# Patient Record
Sex: Female | Born: 1940 | Race: White | Hispanic: No | Smoking: Never smoker
Health system: Southern US, Community
[De-identification: ages and names within clinical notes are randomized; demographics above are authoritative.]

## PROBLEM LIST (undated history)

## (undated) DIAGNOSIS — M1611 Unilateral primary osteoarthritis, right hip: Secondary | ICD-10-CM

## (undated) DIAGNOSIS — Z950 Presence of cardiac pacemaker: Secondary | ICD-10-CM

## (undated) DIAGNOSIS — G8929 Other chronic pain: Secondary | ICD-10-CM

## (undated) DIAGNOSIS — I499 Cardiac arrhythmia, unspecified: Secondary | ICD-10-CM

## (undated) DIAGNOSIS — G629 Polyneuropathy, unspecified: Secondary | ICD-10-CM

## (undated) DIAGNOSIS — I252 Old myocardial infarction: Secondary | ICD-10-CM

## (undated) DIAGNOSIS — M545 Low back pain, unspecified: Secondary | ICD-10-CM

## (undated) DIAGNOSIS — I714 Abdominal aortic aneurysm, without rupture: Secondary | ICD-10-CM

## (undated) DIAGNOSIS — H539 Unspecified visual disturbance: Secondary | ICD-10-CM

## (undated) DIAGNOSIS — K922 Gastrointestinal hemorrhage, unspecified: Secondary | ICD-10-CM

## (undated) DIAGNOSIS — J302 Other seasonal allergic rhinitis: Secondary | ICD-10-CM

## (undated) DIAGNOSIS — D649 Anemia, unspecified: Secondary | ICD-10-CM

## (undated) DIAGNOSIS — R198 Other specified symptoms and signs involving the digestive system and abdomen: Secondary | ICD-10-CM

## (undated) DIAGNOSIS — I5022 Chronic systolic (congestive) heart failure: Secondary | ICD-10-CM

## (undated) DIAGNOSIS — I495 Sick sinus syndrome: Secondary | ICD-10-CM

## (undated) DIAGNOSIS — R0600 Dyspnea, unspecified: Secondary | ICD-10-CM

## (undated) DIAGNOSIS — R011 Cardiac murmur, unspecified: Secondary | ICD-10-CM

## (undated) DIAGNOSIS — R109 Unspecified abdominal pain: Secondary | ICD-10-CM

## (undated) DIAGNOSIS — M810 Age-related osteoporosis without current pathological fracture: Secondary | ICD-10-CM

## (undated) DIAGNOSIS — Z8719 Personal history of other diseases of the digestive system: Secondary | ICD-10-CM

## (undated) DIAGNOSIS — G4750 Parasomnia, unspecified: Secondary | ICD-10-CM

## (undated) DIAGNOSIS — K219 Gastro-esophageal reflux disease without esophagitis: Secondary | ICD-10-CM

## (undated) DIAGNOSIS — K269 Duodenal ulcer, unspecified as acute or chronic, without hemorrhage or perforation: Secondary | ICD-10-CM

## (undated) DIAGNOSIS — Z95818 Presence of other cardiac implants and grafts: Secondary | ICD-10-CM

## (undated) DIAGNOSIS — M1990 Unspecified osteoarthritis, unspecified site: Secondary | ICD-10-CM

## (undated) DIAGNOSIS — I4891 Unspecified atrial fibrillation: Secondary | ICD-10-CM

## (undated) DIAGNOSIS — M1711 Unilateral primary osteoarthritis, right knee: Secondary | ICD-10-CM

## (undated) DIAGNOSIS — K9189 Other postprocedural complications and disorders of digestive system: Secondary | ICD-10-CM

## (undated) DIAGNOSIS — I219 Acute myocardial infarction, unspecified: Secondary | ICD-10-CM

## (undated) DIAGNOSIS — J45909 Unspecified asthma, uncomplicated: Secondary | ICD-10-CM

## (undated) DIAGNOSIS — M109 Gout, unspecified: Secondary | ICD-10-CM

## (undated) DIAGNOSIS — K5790 Diverticulosis of intestine, part unspecified, without perforation or abscess without bleeding: Secondary | ICD-10-CM

## (undated) DIAGNOSIS — Z9889 Other specified postprocedural states: Secondary | ICD-10-CM

## (undated) DIAGNOSIS — I7300 Raynaud's syndrome without gangrene: Secondary | ICD-10-CM

## (undated) DIAGNOSIS — M0520 Rheumatoid vasculitis with rheumatoid arthritis of unspecified site: Secondary | ICD-10-CM

## (undated) DIAGNOSIS — Z86718 Personal history of other venous thrombosis and embolism: Secondary | ICD-10-CM

## (undated) DIAGNOSIS — H532 Diplopia: Secondary | ICD-10-CM

## (undated) DIAGNOSIS — H6120 Impacted cerumen, unspecified ear: Secondary | ICD-10-CM

## (undated) DIAGNOSIS — Z9289 Personal history of other medical treatment: Secondary | ICD-10-CM

## (undated) DIAGNOSIS — M1612 Unilateral primary osteoarthritis, left hip: Secondary | ICD-10-CM

## (undated) DIAGNOSIS — K769 Liver disease, unspecified: Secondary | ICD-10-CM

## (undated) DIAGNOSIS — K259 Gastric ulcer, unspecified as acute or chronic, without hemorrhage or perforation: Secondary | ICD-10-CM

## (undated) DIAGNOSIS — K589 Irritable bowel syndrome without diarrhea: Secondary | ICD-10-CM

## (undated) DIAGNOSIS — T63441A Toxic effect of venom of bees, accidental (unintentional), initial encounter: Secondary | ICD-10-CM

## (undated) DIAGNOSIS — N3289 Other specified disorders of bladder: Secondary | ICD-10-CM

## (undated) DIAGNOSIS — M069 Rheumatoid arthritis, unspecified: Secondary | ICD-10-CM

## (undated) HISTORY — DX: Irritable bowel syndrome, unspecified: K58.9

## (undated) HISTORY — PX: TOTAL SHOULDER ARTHROPLASTY: SHX126

## (undated) HISTORY — PX: REVERSE SHOULDER ARTHROPLASTY: SHX5054

## (undated) HISTORY — DX: Sick sinus syndrome: I49.5

## (undated) HISTORY — PX: TUBAL LIGATION: SHX77

## (undated) HISTORY — DX: Acute myocardial infarction, unspecified: I21.9

## (undated) HISTORY — PX: JOINT REPLACEMENT: SHX530

## (undated) HISTORY — PX: TONSILLECTOMY AND ADENOIDECTOMY: SUR1326

## (undated) HISTORY — PX: ROUX-EN-Y PROCEDURE: SUR1287

## (undated) HISTORY — DX: Duodenal ulcer, unspecified as acute or chronic, without hemorrhage or perforation: K26.9

## (undated) HISTORY — PX: CATARACT EXTRACTION W/ INTRAOCULAR LENS  IMPLANT, BILATERAL: SHX1307

## (undated) HISTORY — PX: CHOLECYSTECTOMY OPEN: SUR202

## (undated) HISTORY — DX: Diverticulosis of intestine, part unspecified, without perforation or abscess without bleeding: K57.90

## (undated) HISTORY — PX: CARPAL TUNNEL RELEASE: SHX101

## (undated) HISTORY — PX: DILATION AND CURETTAGE OF UTERUS: SHX78

## (undated) HISTORY — PX: TOTAL KNEE ARTHROPLASTY: SHX125

## (undated) HISTORY — DX: Anemia, unspecified: D64.9

## (undated) HISTORY — PX: LUMBAR FUSION: SHX111

## (undated) HISTORY — DX: Gastro-esophageal reflux disease without esophagitis: K21.9

## (undated) HISTORY — PX: FOOT FUSION: SHX956

## (undated) HISTORY — PX: ABDOMINAL HYSTERECTOMY: SHX81

## (undated) HISTORY — PX: INGUINAL HERNIA REPAIR: SUR1180

## (undated) HISTORY — PX: APPENDECTOMY: SHX54

---

## 2003-01-11 HISTORY — PX: ROUX-EN-Y GASTRIC BYPASS: SHX1104

## 2015-01-11 DIAGNOSIS — I219 Acute myocardial infarction, unspecified: Secondary | ICD-10-CM

## 2015-01-11 HISTORY — DX: Acute myocardial infarction, unspecified: I21.9

## 2015-01-11 HISTORY — PX: INSERT / REPLACE / REMOVE PACEMAKER: SUR710

## 2015-08-13 DIAGNOSIS — H2512 Age-related nuclear cataract, left eye: Secondary | ICD-10-CM | POA: Diagnosis not present

## 2015-08-13 DIAGNOSIS — H25011 Cortical age-related cataract, right eye: Secondary | ICD-10-CM | POA: Diagnosis not present

## 2015-08-13 DIAGNOSIS — H2511 Age-related nuclear cataract, right eye: Secondary | ICD-10-CM | POA: Diagnosis not present

## 2015-08-13 DIAGNOSIS — H25012 Cortical age-related cataract, left eye: Secondary | ICD-10-CM | POA: Diagnosis not present

## 2015-08-20 DIAGNOSIS — H2512 Age-related nuclear cataract, left eye: Secondary | ICD-10-CM | POA: Diagnosis not present

## 2015-08-20 DIAGNOSIS — H2511 Age-related nuclear cataract, right eye: Secondary | ICD-10-CM | POA: Diagnosis not present

## 2015-08-20 DIAGNOSIS — H25011 Cortical age-related cataract, right eye: Secondary | ICD-10-CM | POA: Diagnosis not present

## 2015-08-20 DIAGNOSIS — H25012 Cortical age-related cataract, left eye: Secondary | ICD-10-CM | POA: Diagnosis not present

## 2015-09-09 DIAGNOSIS — Z961 Presence of intraocular lens: Secondary | ICD-10-CM | POA: Diagnosis not present

## 2015-09-14 DIAGNOSIS — K5900 Constipation, unspecified: Secondary | ICD-10-CM | POA: Diagnosis not present

## 2015-09-14 DIAGNOSIS — K922 Gastrointestinal hemorrhage, unspecified: Secondary | ICD-10-CM | POA: Diagnosis not present

## 2015-09-14 DIAGNOSIS — R109 Unspecified abdominal pain: Secondary | ICD-10-CM | POA: Diagnosis not present

## 2015-10-11 DIAGNOSIS — Z950 Presence of cardiac pacemaker: Secondary | ICD-10-CM

## 2015-10-11 HISTORY — DX: Presence of cardiac pacemaker: Z95.0

## 2016-01-11 HISTORY — PX: LEFT ATRIAL APPENDAGE OCCLUSION: SHX173A

## 2016-08-02 DIAGNOSIS — I2510 Atherosclerotic heart disease of native coronary artery without angina pectoris: Secondary | ICD-10-CM | POA: Diagnosis present

## 2016-08-02 DIAGNOSIS — K219 Gastro-esophageal reflux disease without esophagitis: Secondary | ICD-10-CM | POA: Diagnosis present

## 2016-08-02 DIAGNOSIS — M810 Age-related osteoporosis without current pathological fracture: Secondary | ICD-10-CM | POA: Diagnosis present

## 2016-08-02 DIAGNOSIS — R32 Unspecified urinary incontinence: Secondary | ICD-10-CM | POA: Diagnosis present

## 2016-08-02 DIAGNOSIS — Z006 Encounter for examination for normal comparison and control in clinical research program: Secondary | ICD-10-CM | POA: Diagnosis not present

## 2016-08-02 DIAGNOSIS — H9190 Unspecified hearing loss, unspecified ear: Secondary | ICD-10-CM | POA: Diagnosis present

## 2016-08-02 DIAGNOSIS — Z96619 Presence of unspecified artificial shoulder joint: Secondary | ICD-10-CM | POA: Diagnosis present

## 2016-08-02 DIAGNOSIS — M069 Rheumatoid arthritis, unspecified: Secondary | ICD-10-CM | POA: Diagnosis present

## 2016-08-02 DIAGNOSIS — I252 Old myocardial infarction: Secondary | ICD-10-CM | POA: Diagnosis not present

## 2016-08-02 DIAGNOSIS — Z7901 Long term (current) use of anticoagulants: Secondary | ICD-10-CM | POA: Diagnosis not present

## 2016-08-02 DIAGNOSIS — I4891 Unspecified atrial fibrillation: Secondary | ICD-10-CM | POA: Diagnosis present

## 2016-08-02 DIAGNOSIS — M1990 Unspecified osteoarthritis, unspecified site: Secondary | ICD-10-CM | POA: Diagnosis present

## 2016-08-02 DIAGNOSIS — Z9884 Bariatric surgery status: Secondary | ICD-10-CM | POA: Diagnosis not present

## 2016-08-02 DIAGNOSIS — Z96651 Presence of right artificial knee joint: Secondary | ICD-10-CM | POA: Diagnosis present

## 2016-08-02 DIAGNOSIS — Z9049 Acquired absence of other specified parts of digestive tract: Secondary | ICD-10-CM | POA: Diagnosis not present

## 2016-08-02 DIAGNOSIS — Z86711 Personal history of pulmonary embolism: Secondary | ICD-10-CM | POA: Diagnosis not present

## 2016-08-02 DIAGNOSIS — Z90710 Acquired absence of both cervix and uterus: Secondary | ICD-10-CM | POA: Diagnosis not present

## 2016-08-02 DIAGNOSIS — Z8711 Personal history of peptic ulcer disease: Secondary | ICD-10-CM | POA: Diagnosis not present

## 2016-10-20 DIAGNOSIS — Z9884 Bariatric surgery status: Secondary | ICD-10-CM | POA: Diagnosis not present

## 2016-10-20 DIAGNOSIS — K289 Gastrojejunal ulcer, unspecified as acute or chronic, without hemorrhage or perforation: Secondary | ICD-10-CM | POA: Diagnosis not present

## 2016-10-21 DIAGNOSIS — Z87891 Personal history of nicotine dependence: Secondary | ICD-10-CM | POA: Diagnosis not present

## 2016-10-21 DIAGNOSIS — Z9884 Bariatric surgery status: Secondary | ICD-10-CM | POA: Diagnosis not present

## 2016-10-21 DIAGNOSIS — K219 Gastro-esophageal reflux disease without esophagitis: Secondary | ICD-10-CM | POA: Diagnosis not present

## 2016-10-21 DIAGNOSIS — D649 Anemia, unspecified: Secondary | ICD-10-CM | POA: Diagnosis not present

## 2016-10-21 DIAGNOSIS — I7300 Raynaud's syndrome without gangrene: Secondary | ICD-10-CM | POA: Diagnosis not present

## 2016-10-21 DIAGNOSIS — K314 Gastric diverticulum: Secondary | ICD-10-CM | POA: Diagnosis not present

## 2016-10-21 DIAGNOSIS — K9589 Other complications of other bariatric procedure: Secondary | ICD-10-CM | POA: Diagnosis not present

## 2016-10-21 DIAGNOSIS — Y838 Other surgical procedures as the cause of abnormal reaction of the patient, or of later complication, without mention of misadventure at the time of the procedure: Secondary | ICD-10-CM | POA: Diagnosis not present

## 2016-10-21 DIAGNOSIS — K289 Gastrojejunal ulcer, unspecified as acute or chronic, without hemorrhage or perforation: Secondary | ICD-10-CM | POA: Diagnosis not present

## 2016-10-21 DIAGNOSIS — M1990 Unspecified osteoarthritis, unspecified site: Secondary | ICD-10-CM | POA: Diagnosis not present

## 2016-10-21 DIAGNOSIS — I4891 Unspecified atrial fibrillation: Secondary | ICD-10-CM | POA: Diagnosis not present

## 2016-10-21 DIAGNOSIS — K259 Gastric ulcer, unspecified as acute or chronic, without hemorrhage or perforation: Secondary | ICD-10-CM | POA: Diagnosis not present

## 2016-10-21 DIAGNOSIS — K287 Chronic gastrojejunal ulcer without hemorrhage or perforation: Secondary | ICD-10-CM | POA: Diagnosis not present

## 2016-10-21 DIAGNOSIS — R32 Unspecified urinary incontinence: Secondary | ICD-10-CM | POA: Diagnosis not present

## 2016-10-21 DIAGNOSIS — K316 Fistula of stomach and duodenum: Secondary | ICD-10-CM | POA: Diagnosis not present

## 2016-10-21 DIAGNOSIS — H9190 Unspecified hearing loss, unspecified ear: Secondary | ICD-10-CM | POA: Diagnosis not present

## 2016-11-15 ENCOUNTER — Encounter: Payer: Self-pay | Admitting: Family Medicine

## 2016-11-15 ENCOUNTER — Ambulatory Visit (INDEPENDENT_AMBULATORY_CARE_PROVIDER_SITE_OTHER): Payer: Medicare Other | Admitting: Family Medicine

## 2016-11-15 VITALS — BP 112/58 | HR 82 | Temp 98.0°F | Ht 60.75 in | Wt 123.6 lb

## 2016-11-15 DIAGNOSIS — G8929 Other chronic pain: Secondary | ICD-10-CM

## 2016-11-15 DIAGNOSIS — I252 Old myocardial infarction: Secondary | ICD-10-CM

## 2016-11-15 DIAGNOSIS — Z8719 Personal history of other diseases of the digestive system: Secondary | ICD-10-CM

## 2016-11-15 DIAGNOSIS — G4750 Parasomnia, unspecified: Secondary | ICD-10-CM

## 2016-11-15 DIAGNOSIS — N3289 Other specified disorders of bladder: Secondary | ICD-10-CM | POA: Diagnosis not present

## 2016-11-15 DIAGNOSIS — Z23 Encounter for immunization: Secondary | ICD-10-CM

## 2016-11-15 DIAGNOSIS — M1611 Unilateral primary osteoarthritis, right hip: Secondary | ICD-10-CM

## 2016-11-15 DIAGNOSIS — Z7689 Persons encountering health services in other specified circumstances: Secondary | ICD-10-CM

## 2016-11-15 MED ORDER — GABAPENTIN 300 MG PO CAPS: 300.0000 mg | ORAL_CAPSULE | Freq: Three times a day (TID) | ORAL | 3 refills | Status: DC

## 2016-11-15 MED ORDER — MIRABEGRON ER 50 MG PO TB24: 50.0000 mg | ORAL_TABLET | Freq: Every day | ORAL | 3 refills | Status: DC

## 2016-11-15 MED ORDER — ROPINIROLE HCL 0.5 MG PO TABS: 0.5000 mg | ORAL_TABLET | Freq: Three times a day (TID) | ORAL | 3 refills | Status: DC

## 2016-11-15 NOTE — Patient Instructions (Signed)
It was great to meet you today! Thank you for letting me participate in your care!  Today, we discussed your medical history, your current problem of a bleeding duodenal ulcer, and your current hip pain.   I have made referrals to Gastroenterology for endoscopy to evaluate the current status of the ulcer and to Orthopedics for evaluation for a total hip replacement.  Our office will contact you once those appointments have been made and give you the time and dates. I will follow up with you once you have been seen and evaluated with those physicians.   I will also obtain your medical records from your providers in [place], [place].  It was a pleasure to be a part of your care and I look forward to helping live a rich and satisfying life!  Please contact me for any and all medical concerns or questions.  Be well, Harolyn Rutherford, DO PGY-1, [facility]

## 2016-11-15 NOTE — Progress Notes (Signed)
Subjective: Chief Complaint  Patient presents with  . Establish Care     HPI: Deanna Spencer is a 76 y.o. presenting to clinic today to discuss the following:  1 Establish Care 2 Get referral to GI for endoscopy to check status of bleeding ulcer 3 Get referral to orthopedics for total hip surgery  Deanna Spencer is a 76y/o female with a complex past medical history. Today her main concerns are to establish care with a PCP so she can get a endoscopy to see if her bleeding stomach ulcer has healed. She recently moved to the area to be closer to her daughter and was receiving care in [place], [place]. Ultimately, she is seeking a referral to orthopedics for a total hip. She was told she had "bone on bone" osteoarthritis and it causes her constant pain rated at a 10/10 on most days. She can ambulate with the assistance of a cane.  Health Maintenance: flu shot     ROS noted in HPI.   Past Medical, Surgical, Social, and Family History Reviewed & Updated per EMR.   Pertinent Historical Findings include:   Social History   Tobacco Use  Smoking Status Never Smoker  Smokeless Tobacco Never Used      Objective: BP (!) 112/58   Pulse 82   Temp 98 F (36.7 C)   Ht 5' 0.75" (1.543 m)   Wt 123 lb 9.6 oz (56.1 kg)   SpO2 97%   BMI 23.55 kg/m  Vitals and nursing notes reviewed  Physical Exam Gen: Alert and Oriented x 3, NAD HEENT: Normocephalic, atraumatic, PERRLA, EOMI, TM visible with good light reflex, non-swollen, non-erythematous turbinates, non-erythematous pharyngeal mucosa, no exudates Neck: trachea midline, no thyroidmegaly, no LAD CV: RRR, no murmurs, normal S1, S2 split, +1 pulses dorsalis pedis bilaterally Resp: CTAB, no wheezing, rales, or rhonchi, comfortable work of breathing Abd: non-distended, non-tender, soft, +bs in all four quadrants, no hepatosplenomegaly MSK: Gait is limited by poor weight bearing on right leg. Needs assistance with cane to walk with limited range  of motion in right hip. Otherwise, FROM in all other extremities Ext: no clubbing, cyanosis, or edema Neuro: CN II-XII grossly intact, no focal or gross deficits Skin: warm, dry, intact, no rashes   No results found for this or any previous visit (from the past 72 hour(s)).  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 Referral to GI for endoscopy to assess current status of ulcer  Spastic bladder Refilled myrbetriq 50mg  daily for spastic bladder  Parasomnia Refilled Ropinorole 0.5mg  TID today  Encounter to establish care I have not ordered any labs today due to not having any records from her previous providers. Patient signed several information release forms and we begun the process of requesting medical records from her previous providers.     PATIENT EDUCATION PROVIDED: See AVS    Diagnosis and plan along with any newly prescribed medication(s) were discussed in detail with this patient today. The patient verbalized understanding and agreed with the plan. Patient advised if symptoms worsen return to clinic or ER.   Health Maintainance:   Orders Placed This Encounter  Procedures  . Flu Vaccine QUAD 36+ mos IM  . Ambulatory referral to Gastroenterology    Referral Priority:   Routine    Referral Type:   Consultation    Referral Reason:   Specialty Services Required  Number of Visits Requested:   1  . Ambulatory referral to Orthopedic Surgery    Referral Priority:   Routine    Referral Type:   Surgical    Referral Reason:   Specialty Services Required    Requested Specialty:   Orthopedic Surgery    Number of Visits Requested:   1    Meds ordered this encounter  Medications  . aspirin EC 81 MG tablet    Sig: Take 81 mg daily by mouth.  . colchicine 0.6 MG tablet    Sig: Take 0.6 mg as needed by mouth (for gout attacks).  . DISCONTD: gabapentin  (NEURONTIN) 300 MG capsule    Sig: Take 300 mg 3 (three) times daily by mouth.  Marland Kitchen HYDROcodone-acetaminophen (NORCO/VICODIN) 5-325 MG tablet    Sig: Take 1 tablet every 6 (six) hours as needed by mouth for moderate pain.  Marland Kitchen omeprazole (PRILOSEC) 20 MG capsule    Sig: Take 20 mg daily by mouth.  . ferrous sulfate 325 (65 FE) MG tablet    Sig: Take 325 mg daily by mouth.  . DISCONTD: rOPINIRole (REQUIP) 0.5 MG tablet    Sig: Take 0.5 mg 3 (three) times daily by mouth.  . sucralfate (CARAFATE) 1 GM/10ML suspension    Sig: Take 1 g 4 (four) times daily by mouth.  . DISCONTD: mirabegron ER (MYRBETRIQ) 50 MG TB24 tablet    Sig: Take 50 mg daily by mouth.  Marland Kitchen acetaminophen (TYLENOL) 500 MG tablet    Sig: Take 500 mg every 6 (six) hours as needed by mouth for mild pain.  . Loperamide-Simethicone 2-125 MG TABS    Sig: Take 2 tablets as needed by mouth (for diarrhea).  . cetirizine (ZYRTEC) 10 MG tablet    Sig: Take 10 mg daily by mouth.  . Alpha-D-Galactosidase (BEANO) TABS    Sig: Take 2 tablets as needed by mouth (gas).  Marland Kitchen rOPINIRole (REQUIP) 0.5 MG tablet    Sig: Take 1 tablet (0.5 mg total) 3 (three) times daily by mouth.    Dispense:  30 tablet    Refill:  3  . mirabegron ER (MYRBETRIQ) 50 MG TB24 tablet    Sig: Take 1 tablet (50 mg total) daily by mouth.    Dispense:  30 tablet    Refill:  3  . gabapentin (NEURONTIN) 300 MG capsule    Sig: Take 1 capsule (300 mg total) 3 (three) times daily by mouth.    Dispense:  90 capsule    Refill:  [place], DO 11/15/2016, 11:43 AM PGY-1, [facility]

## 2016-11-17 ENCOUNTER — Ambulatory Visit (INDEPENDENT_AMBULATORY_CARE_PROVIDER_SITE_OTHER): Payer: Medicare Other | Admitting: Gastroenterology

## 2016-11-17 ENCOUNTER — Encounter: Payer: Self-pay | Admitting: Gastroenterology

## 2016-11-17 ENCOUNTER — Other Ambulatory Visit (INDEPENDENT_AMBULATORY_CARE_PROVIDER_SITE_OTHER): Payer: Medicare Other

## 2016-11-17 VITALS — BP 120/60 | HR 72 | Ht 60.75 in | Wt 123.2 lb

## 2016-11-17 DIAGNOSIS — I481 Persistent atrial fibrillation: Secondary | ICD-10-CM

## 2016-11-17 DIAGNOSIS — K289 Gastrojejunal ulcer, unspecified as acute or chronic, without hemorrhage or perforation: Secondary | ICD-10-CM

## 2016-11-17 DIAGNOSIS — T85898A Other specified complication of other internal prosthetic devices, implants and grafts, initial encounter: Secondary | ICD-10-CM

## 2016-11-17 DIAGNOSIS — I4819 Other persistent atrial fibrillation: Secondary | ICD-10-CM

## 2016-11-17 DIAGNOSIS — Z950 Presence of cardiac pacemaker: Secondary | ICD-10-CM

## 2016-11-17 DIAGNOSIS — D62 Acute posthemorrhagic anemia: Secondary | ICD-10-CM

## 2016-11-17 LAB — CBC
HEMATOCRIT: 38.6 % (ref 36.0–46.0)
Hemoglobin: 12.5 g/dL (ref 12.0–15.0)
MCHC: 32.3 g/dL (ref 30.0–36.0)
MCV: 94.3 fl (ref 78.0–100.0)
PLATELETS: 418 10*3/uL — AB (ref 150.0–400.0)
RBC: 4.09 Mil/uL (ref 3.87–5.11)
RDW: 19.8 % — ABNORMAL HIGH (ref 11.5–15.5)
WBC: 6.6 10*3/uL (ref 4.0–10.5)

## 2016-11-17 LAB — BASIC METABOLIC PANEL
BUN: 21 mg/dL (ref 6–23)
CALCIUM: 9.3 mg/dL (ref 8.4–10.5)
CHLORIDE: 109 meq/L (ref 96–112)
CO2: 23 meq/L (ref 19–32)
Creatinine, Ser: 0.75 mg/dL (ref 0.40–1.20)
GFR: 80.17 mL/min (ref >60.00)
Glucose, Bld: 97 mg/dL (ref 70–99)
Potassium: 4.6 mEq/L (ref 3.5–5.1)
SODIUM: 141 meq/L (ref 135–145)

## 2016-11-17 LAB — FERRITIN: Ferritin: 463.9 ng/mL — ABNORMAL HIGH (ref 10.0–291.0)

## 2016-11-17 LAB — IBC PANEL
Iron: 64 ug/dL (ref 42–145)
SATURATION RATIOS: 23 % (ref 20.0–50.0)
TRANSFERRIN: 199 mg/dL — AB (ref 212.0–360.0)

## 2016-11-17 LAB — FOLATE: Folate: 23.6 ng/mL (ref >5.9)

## 2016-11-17 LAB — VITAMIN B12: Vitamin B-12: 353 pg/mL (ref 211–911)

## 2016-11-17 NOTE — Progress Notes (Addendum)
[facility] Consult Note:  History: Deanna Spencer 11/17/2016  Referring physician: Nuala Alpha, DO  Reason for consult/chief complaint: duodenal ulcer (hx several months ago in [place]; no current GI symptoms; needs Hip surgery soon and needs to be cleared beforehand)   Subjective  HPI:  This is a 76 year old woman referred by primary care and above and seen with her daughter in attendance today for history of reported duodenal ulcer. Unfortunately, we have no records from her extensive history in [place] prior to her moving here within the last month. Deanna Spencer has moved here in order to be closer to her daughter due to her worsening health. 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 Her initial surgery was about 15 years ago, and she reports several serious GI bleeds requiring hospitalization. Within the last 12-18 months she has had 2 surgical revisions of the anastomosis due to bleeding ulcer. There was an episode of bleeding with melena anemia and hospital stay requiring transfusion sometime in September of this year. That apparently did not require surgery. She was put on Carafate and says that a repeat upper endoscopy in late September showed the ulcer had improved. She has not had any overt bleeding since then, and remains on iron supplements. She remains on aspirin due to her history of an MI that apparently occurred about 18 months ago. Her daughter says that at that time she had new onset A. fib and suffered an MI. Therefore, her mother was placed on Coumadin until it was discontinued after placement of a left atrial appendage "watchman" device sometime in the last couple of months. It sounds like that may have occurred around the time of this most recent GI bleed.  Deanna Spencer denies abdominal pain or altered bowel habits, but her appetite is only fair she has difficulty maintaining her  weight. She requests reevaluation of the ulcer for an assessment of possible rebleeding risk since she hopes to undergo a right hip replacement and understand she might be anticoagulation for some time after that.  ROS:  Review of Systems  Constitutional: Negative for appetite change and unexpected weight change.  HENT: Negative for mouth sores and voice change.   Eyes: Negative for pain and redness.  Respiratory: Negative for cough and shortness of breath.   Cardiovascular: Negative for chest pain and palpitations.       Intermittent peripheral edema  Genitourinary: Negative for dysuria and hematuria.  Musculoskeletal: Positive for arthralgias. Negative for myalgias.  Skin: Negative for pallor and rash.  Neurological: Negative for weakness and headaches.  Hematological: Negative for adenopathy.     Past Medical History: Past Medical History:  Diagnosis Date  . Anemia   . Arthritis   . Diverticulosis   . Duodenal ulcer   . GERD (gastroesophageal reflux disease)   . Heart attack ([place])   . IBS (irritable bowel syndrome)      Past Surgical History: Past Surgical History:  Procedure Laterality Date  . ABDOMINAL HYSTERECTOMY    . APPENDECTOMY    . BARIATRIC SURGERY    . CHOLECYSTECTOMY    . HERNIA REPAIR       Family History: Family History  Problem Relation Age of Onset  . Heart disease Mother   . Liver disease Father   . Ulcers Father   . Prostate cancer Brother   . Colon cancer Neg Hx     Social History: Social  History   Socioeconomic History  . Marital status: Widowed    Spouse name: None  . Number of children: None  . Years of education: None  . Highest education level: None  Social Needs  . Financial resource strain: None  . Food insecurity - worry: None  . Food insecurity - inability: None  . Transportation needs - medical: None  . Transportation needs - non-medical: None  Occupational History  . None  Tobacco Use  . Smoking status: Never  Smoker  . Smokeless tobacco: Never Used  Substance and Sexual Activity  . Alcohol use: Yes    Comment: less than 1 alcoholic beverage daily  . Drug use: No  . Sexual activity: None  Other Topics Concern  . None  Social History Narrative  . None    Allergies: Allergies  Allergen Reactions  . Demerol [Meperidine] Anaphylaxis    Outpatient Meds: Current Outpatient Medications  Medication Sig Dispense Refill  . acetaminophen (TYLENOL) 500 MG tablet Take 500 mg every 6 (six) hours as needed by mouth for mild pain.    . Alpha-D-Galactosidase (BEANO) TABS Take 2 tablets as needed by mouth (gas).    Marland Kitchen aspirin EC 81 MG tablet Take 81 mg daily by mouth.    . cetirizine (ZYRTEC) 10 MG tablet Take 10 mg daily by mouth.    . colchicine 0.6 MG tablet Take 0.6 mg as needed by mouth (for gout attacks).    . ferrous sulfate 325 (65 FE) MG tablet Take 325 mg daily by mouth.    . gabapentin (NEURONTIN) 300 MG capsule Take 1 capsule (300 mg total) 3 (three) times daily by mouth. 90 capsule 3  . HYDROcodone-acetaminophen (NORCO/VICODIN) 5-325 MG tablet Take 1 tablet every 6 (six) hours as needed by mouth for moderate pain.    . Loperamide-Simethicone 2-125 MG TABS Take 2 tablets as needed by mouth (for diarrhea).    . mirabegron ER (MYRBETRIQ) 50 MG TB24 tablet Take 1 tablet (50 mg total) daily by mouth. 30 tablet 3  . omeprazole (PRILOSEC) 20 MG capsule Take 20 mg daily by mouth.    Marland Kitchen rOPINIRole (REQUIP) 0.5 MG tablet Take 1 tablet (0.5 mg total) 3 (three) times daily by mouth. 30 tablet 3  . sucralfate (CARAFATE) 1 GM/10ML suspension Take 1 g 4 (four) times daily by mouth.     No current facility-administered medications for this visit.       ___________________________________________________________________ Objective   Exam:  BP 120/60   Pulse 72   Ht 5' 0.75" (1.543 m)   Wt 123 lb 3.2 oz (55.9 kg)   BMI 23.47 kg/m    General: this is a(n) chronically ill-appearing older woman,  severely antalgic gait, able to get on exam table slowly but without assistance.   Eyes: sclera anicteric, no redness  ENT: oral mucosa moist without lesions, no cervical or supraclavicular lymphadenopathy, good dentition  CV: Irregular without murmur, S1/S2, no JVD, no peripheral edema, left upper chest wall pacemaker  Resp: clear to auscultation bilaterally, normal RR and effort noted  GI: soft, no tenderness, with active bowel sounds. No guarding or palpable organomegaly noted. Short midline vertical scar  Skin; warm and dry, no rash or jaundice noted. A few ecchymoses on the arms and hands  Neuro: awake, alert and oriented x 3. Normal gross motor function and fluent speech  No records are data for review She just established care with new family practice physician 2 days ago, no labs ordered.  Assessment: Encounter Diagnoses  Name Primary?  Marland Kitchen Anastomotic ulcer S/P gastric bypass Yes  . Acute blood loss anemia   . Persistent atrial fibrillation ([place])   . Cardiac pacemaker in situ     It sounds as if Riyana has had recurrent bleeding from a gastric bypass anastomotic ulcer. She remains on a PPI but with no probable benefit given her surgical anatomy. She reports having previously tested negative for H. pylori. She remains on aspirin due to underlying coronary disease with prior MI reported.  Plan:  Check CBC, iron studies, N02 and folic acid today Upper endoscopy next week at the hospital endoscopy lab to reassess ulcer. She is at increased risk for procedure related complications due to her cardiac history, which is why I am doing it in the hospital endoscopy lab. This also seems her blood count is reasonably good today.  She and her daughter understand that we'll be able to assess the presence of any ulcer that remains, it might be difficult to give an accurate assessment of bleeding risk, especially if she is to go on oral anticoagulation after hip surgery. Given that, perhaps  her orthopedic surgeon will consider just aspirin prophylaxis if deemed appropriate. I will be glad to provide my office consult note and endoscopy report to the orthopedic surgeon upon request after they have seen them.  Risks and benefits of an upper endoscopy were reviewed in detail and the patient is agreeable.  The benefits and risks of the planned procedure were described in detail with the patient or (when appropriate) their health care proxy.  Risks were outlined as including, but not limited to, bleeding, infection, perforation, adverse medication reaction leading to cardiac or pulmonary decompensation, or pancreatitis (if ERCP).  The limitation of incomplete mucosal visualization was also discussed.  No guarantees or warranties were given.   Thank you for the courtesy of this consult.  Please call me with any questions or concerns.  Nelida Meuse III  CC: Nuala Alpha, DO

## 2016-11-17 NOTE — Patient Instructions (Signed)
If you are age 74 or older, your body mass index should be between 23-30. Your Body mass index is 23.47 kg/m. If this is out of the aforementioned range listed, please consider follow up with your Primary Care Provider.  If you are age 44 or younger, your body mass index should be between 19-25. Your Body mass index is 23.47 kg/m. If this is out of the aformentioned range listed, please consider follow up with your Primary Care Provider.   You have been scheduled for an endoscopy. Please follow written instructions given to you at your visit today. If you use inhalers (even only as needed), please bring them with you on the day of your procedure. Your physician has requested that you go to www.startemmi.com and enter the access code given to you at your visit today. This web site gives a general overview about your procedure. However, you should still follow specific instructions given to you by our office regarding your preparation for the procedure.  Thank you for choosing [facility]  Dr Wilfrid Lund III

## 2016-11-18 ENCOUNTER — Other Ambulatory Visit: Payer: Self-pay

## 2016-11-18 ENCOUNTER — Encounter (HOSPITAL_COMMUNITY): Payer: Self-pay

## 2016-11-18 ENCOUNTER — Telehealth: Payer: Self-pay | Admitting: *Deleted

## 2016-11-18 DIAGNOSIS — I252 Old myocardial infarction: Secondary | ICD-10-CM | POA: Insufficient documentation

## 2016-11-18 DIAGNOSIS — G4750 Parasomnia, unspecified: Secondary | ICD-10-CM | POA: Insufficient documentation

## 2016-11-18 DIAGNOSIS — Z8719 Personal history of other diseases of the digestive system: Secondary | ICD-10-CM

## 2016-11-18 DIAGNOSIS — G8929 Other chronic pain: Secondary | ICD-10-CM

## 2016-11-18 DIAGNOSIS — N3289 Other specified disorders of bladder: Secondary | ICD-10-CM | POA: Insufficient documentation

## 2016-11-18 DIAGNOSIS — Z7689 Persons encountering health services in other specified circumstances: Secondary | ICD-10-CM | POA: Insufficient documentation

## 2016-11-18 DIAGNOSIS — M1611 Unilateral primary osteoarthritis, right hip: Secondary | ICD-10-CM | POA: Insufficient documentation

## 2016-11-18 DIAGNOSIS — R109 Unspecified abdominal pain: Secondary | ICD-10-CM | POA: Insufficient documentation

## 2016-11-18 DIAGNOSIS — Z23 Encounter for immunization: Secondary | ICD-10-CM | POA: Insufficient documentation

## 2016-11-18 HISTORY — DX: Old myocardial infarction: I25.2

## 2016-11-18 HISTORY — DX: Parasomnia, unspecified: G47.50

## 2016-11-18 HISTORY — DX: Other specified disorders of bladder: N32.89

## 2016-11-18 HISTORY — DX: Personal history of other diseases of the digestive system: Z87.19

## 2016-11-18 HISTORY — DX: Other chronic pain: G89.29

## 2016-11-18 HISTORY — DX: Unilateral primary osteoarthritis, right hip: M16.11

## 2016-11-18 NOTE — Telephone Encounter (Signed)
Patient left message on voicemail stating that medication sent in for bladder was too expensive and wants to know if MD could send in something else.

## 2016-11-18 NOTE — H&P (Signed)
Pt. Recently moved from [facility] Schmidt [phone] Dr. Cherly Anderson placed pt. Watchman device # [phone] Dr. Mearl Latin GI doctor [phone]. Pt. Also has pacemaker Device clinic phone number [phone]  Joylene Igo [phone]  Device orders faxed to above number .

## 2016-11-18 NOTE — Assessment & Plan Note (Addendum)
I have not ordered any labs today due to not having any records from her previous providers. Patient signed several information release forms and we begun the process of requesting medical records from her previous providers.  Once I have obtained a more detailed history I will order appropriate labs as needed.

## 2016-11-18 NOTE — Assessment & Plan Note (Addendum)
Referral to Orthopedics for evaluation for total hip replacement surgery

## 2016-11-18 NOTE — Assessment & Plan Note (Signed)
Refilled Ropinorole 0.5mg  TID today

## 2016-11-18 NOTE — Assessment & Plan Note (Signed)
Refilled myrbetriq 50mg  daily for spastic bladder

## 2016-11-18 NOTE — Assessment & Plan Note (Signed)
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 Referral to GI for endoscopy to assess current status of ulcer

## 2016-11-21 ENCOUNTER — Encounter (HOSPITAL_COMMUNITY): Payer: Self-pay

## 2016-11-21 ENCOUNTER — Emergency Department (HOSPITAL_COMMUNITY): Payer: Medicare Other

## 2016-11-21 ENCOUNTER — Encounter (HOSPITAL_COMMUNITY): Admission: EM | Disposition: A | Discharge status: Home / Self Care | Payer: Self-pay | Source: Home / Self Care

## 2016-11-21 ENCOUNTER — Other Ambulatory Visit: Payer: Self-pay

## 2016-11-21 ENCOUNTER — Other Ambulatory Visit: Payer: Self-pay | Admitting: Family Medicine

## 2016-11-21 ENCOUNTER — Inpatient Hospital Stay (HOSPITAL_COMMUNITY)
Admission: EM | Admit: 2016-11-21 | Discharge: 2016-11-30 | DRG: 908 | Disposition: A | Discharge status: Home / Self Care | Payer: Medicare Other | Attending: Surgery | Admitting: Surgery

## 2016-11-21 DIAGNOSIS — D1803 Hemangioma of intra-abdominal structures: Secondary | ICD-10-CM | POA: Diagnosis present

## 2016-11-21 DIAGNOSIS — Z981 Arthrodesis status: Secondary | ICD-10-CM

## 2016-11-21 DIAGNOSIS — I10 Essential (primary) hypertension: Secondary | ICD-10-CM | POA: Diagnosis present

## 2016-11-21 DIAGNOSIS — K219 Gastro-esophageal reflux disease without esophagitis: Secondary | ICD-10-CM

## 2016-11-21 DIAGNOSIS — R1084 Generalized abdominal pain: Secondary | ICD-10-CM | POA: Diagnosis not present

## 2016-11-21 DIAGNOSIS — R198 Other specified symptoms and signs involving the digestive system and abdomen: Secondary | ICD-10-CM

## 2016-11-21 DIAGNOSIS — I252 Old myocardial infarction: Secondary | ICD-10-CM | POA: Diagnosis not present

## 2016-11-21 DIAGNOSIS — K5790 Diverticulosis of intestine, part unspecified, without perforation or abscess without bleeding: Secondary | ICD-10-CM | POA: Diagnosis not present

## 2016-11-21 DIAGNOSIS — Z8711 Personal history of peptic ulcer disease: Secondary | ICD-10-CM

## 2016-11-21 DIAGNOSIS — I2510 Atherosclerotic heart disease of native coronary artery without angina pectoris: Secondary | ICD-10-CM | POA: Diagnosis present

## 2016-11-21 DIAGNOSIS — M1611 Unilateral primary osteoarthritis, right hip: Secondary | ICD-10-CM | POA: Diagnosis present

## 2016-11-21 DIAGNOSIS — M069 Rheumatoid arthritis, unspecified: Secondary | ICD-10-CM | POA: Diagnosis present

## 2016-11-21 DIAGNOSIS — Z9889 Other specified postprocedural states: Secondary | ICD-10-CM

## 2016-11-21 DIAGNOSIS — T8132XA Disruption of internal operation (surgical) wound, not elsewhere classified, initial encounter: Secondary | ICD-10-CM | POA: Diagnosis not present

## 2016-11-21 DIAGNOSIS — K633 Ulcer of intestine: Secondary | ICD-10-CM | POA: Diagnosis not present

## 2016-11-21 DIAGNOSIS — K9589 Other complications of other bariatric procedure: Secondary | ICD-10-CM | POA: Diagnosis present

## 2016-11-21 DIAGNOSIS — K589 Irritable bowel syndrome without diarrhea: Secondary | ICD-10-CM | POA: Diagnosis present

## 2016-11-21 DIAGNOSIS — G2581 Restless legs syndrome: Secondary | ICD-10-CM | POA: Diagnosis present

## 2016-11-21 DIAGNOSIS — Z9049 Acquired absence of other specified parts of digestive tract: Secondary | ICD-10-CM

## 2016-11-21 DIAGNOSIS — Z8042 Family history of malignant neoplasm of prostate: Secondary | ICD-10-CM

## 2016-11-21 DIAGNOSIS — R109 Unspecified abdominal pain: Secondary | ICD-10-CM | POA: Diagnosis not present

## 2016-11-21 DIAGNOSIS — E876 Hypokalemia: Secondary | ICD-10-CM | POA: Diagnosis not present

## 2016-11-21 DIAGNOSIS — N3289 Other specified disorders of bladder: Secondary | ICD-10-CM | POA: Diagnosis present

## 2016-11-21 DIAGNOSIS — Y733 Surgical instruments, materials and gastroenterology and urology devices (including sutures) associated with adverse incidents: Secondary | ICD-10-CM | POA: Diagnosis present

## 2016-11-21 DIAGNOSIS — N3281 Overactive bladder: Secondary | ICD-10-CM | POA: Diagnosis present

## 2016-11-21 DIAGNOSIS — R1012 Left upper quadrant pain: Secondary | ICD-10-CM | POA: Diagnosis not present

## 2016-11-21 DIAGNOSIS — Z95818 Presence of other cardiac implants and grafts: Secondary | ICD-10-CM | POA: Diagnosis not present

## 2016-11-21 DIAGNOSIS — I482 Chronic atrial fibrillation: Secondary | ICD-10-CM | POA: Diagnosis present

## 2016-11-21 DIAGNOSIS — Z885 Allergy status to narcotic agent status: Secondary | ICD-10-CM

## 2016-11-21 DIAGNOSIS — K251 Acute gastric ulcer with perforation: Secondary | ICD-10-CM | POA: Diagnosis not present

## 2016-11-21 DIAGNOSIS — R9431 Abnormal electrocardiogram [ECG] [EKG]: Secondary | ICD-10-CM | POA: Diagnosis not present

## 2016-11-21 DIAGNOSIS — I7300 Raynaud's syndrome without gangrene: Secondary | ICD-10-CM | POA: Diagnosis present

## 2016-11-21 DIAGNOSIS — M109 Gout, unspecified: Secondary | ICD-10-CM | POA: Diagnosis not present

## 2016-11-21 DIAGNOSIS — Z7982 Long term (current) use of aspirin: Secondary | ICD-10-CM

## 2016-11-21 DIAGNOSIS — F419 Anxiety disorder, unspecified: Secondary | ICD-10-CM | POA: Diagnosis not present

## 2016-11-21 DIAGNOSIS — G629 Polyneuropathy, unspecified: Secondary | ICD-10-CM | POA: Diagnosis not present

## 2016-11-21 DIAGNOSIS — Z90710 Acquired absence of both cervix and uterus: Secondary | ICD-10-CM

## 2016-11-21 DIAGNOSIS — Z96653 Presence of artificial knee joint, bilateral: Secondary | ICD-10-CM | POA: Diagnosis present

## 2016-11-21 DIAGNOSIS — Z950 Presence of cardiac pacemaker: Secondary | ICD-10-CM

## 2016-11-21 DIAGNOSIS — I4891 Unspecified atrial fibrillation: Secondary | ICD-10-CM

## 2016-11-21 DIAGNOSIS — I714 Abdominal aortic aneurysm, without rupture: Secondary | ICD-10-CM | POA: Diagnosis present

## 2016-11-21 DIAGNOSIS — R000 Tachycardia, unspecified: Secondary | ICD-10-CM | POA: Diagnosis not present

## 2016-11-21 DIAGNOSIS — K631 Perforation of intestine (nontraumatic): Secondary | ICD-10-CM | POA: Diagnosis not present

## 2016-11-21 DIAGNOSIS — Z8249 Family history of ischemic heart disease and other diseases of the circulatory system: Secondary | ICD-10-CM

## 2016-11-21 HISTORY — PX: LAPAROTOMY: SHX154

## 2016-11-21 HISTORY — DX: Unspecified asthma, uncomplicated: J45.909

## 2016-11-21 HISTORY — DX: Age-related osteoporosis without current pathological fracture: M81.0

## 2016-11-21 HISTORY — DX: Cardiac murmur, unspecified: R01.1

## 2016-11-21 HISTORY — DX: Other chronic pain: G89.29

## 2016-11-21 HISTORY — DX: Polyneuropathy, unspecified: G62.9

## 2016-11-21 HISTORY — DX: Raynaud's syndrome without gangrene: I73.00

## 2016-11-21 HISTORY — DX: Rheumatoid vasculitis with rheumatoid arthritis of unspecified site: M05.20

## 2016-11-21 HISTORY — DX: Low back pain, unspecified: M54.50

## 2016-11-21 HISTORY — DX: Unspecified osteoarthritis, unspecified site: M19.90

## 2016-11-21 HISTORY — DX: Personal history of other diseases of the digestive system: Z87.19

## 2016-11-21 HISTORY — DX: Low back pain: M54.5

## 2016-11-21 HISTORY — DX: Personal history of other medical treatment: Z92.89

## 2016-11-21 LAB — URINALYSIS, ROUTINE W REFLEX MICROSCOPIC
Bacteria, UA: NONE SEEN
Bilirubin Urine: NEGATIVE
Glucose, UA: NEGATIVE mg/dL
Hgb urine dipstick: NEGATIVE
Ketones, ur: 20 mg/dL — AB
Nitrite: NEGATIVE
Protein, ur: NEGATIVE mg/dL
Specific Gravity, Urine: >1.046 — ABNORMAL HIGH (ref 1.005–1.030)
pH: 5 (ref 5.0–8.0)

## 2016-11-21 LAB — BASIC METABOLIC PANEL
Anion gap: 11 (ref 5–15)
BUN: 19 mg/dL (ref 6–20)
CO2: 21 mmol/L — ABNORMAL LOW (ref 22–32)
Calcium: 9.4 mg/dL (ref 8.9–10.3)
Chloride: 107 mmol/L (ref 101–111)
Creatinine, Ser: 0.97 mg/dL (ref 0.44–1.00)
GFR calc Af Amer: >60 mL/min (ref >60)
GFR calc non Af Amer: 56 mL/min — ABNORMAL LOW (ref >60)
Glucose, Bld: 117 mg/dL — ABNORMAL HIGH (ref 65–99)
Potassium: 3.6 mmol/L (ref 3.5–5.1)
Sodium: 139 mmol/L (ref 135–145)

## 2016-11-21 LAB — I-STAT TROPONIN, ED: Troponin i, poc: 0 ng/mL (ref 0.00–0.08)

## 2016-11-21 LAB — CBC
HCT: 44.7 % (ref 36.0–46.0)
Hemoglobin: 14.5 g/dL (ref 12.0–15.0)
MCH: 30.5 pg (ref 26.0–34.0)
MCHC: 32.4 g/dL (ref 30.0–36.0)
MCV: 93.9 fL (ref 78.0–100.0)
Platelets: 447 10*3/uL — ABNORMAL HIGH (ref 150–400)
RBC: 4.76 MIL/uL (ref 3.87–5.11)
RDW: 18.9 % — ABNORMAL HIGH (ref 11.5–15.5)
WBC: 12.3 10*3/uL — ABNORMAL HIGH (ref 4.0–10.5)

## 2016-11-21 LAB — PROTIME-INR
INR: 0.88
Prothrombin Time: 11.8 seconds (ref 11.4–15.2)

## 2016-11-21 LAB — LIPASE, BLOOD: Lipase: 24 U/L (ref 11–51)

## 2016-11-21 SURGERY — LAPAROTOMY, EXPLORATORY
Anesthesia: General | Site: Abdomen

## 2016-11-21 MED ORDER — PROPOFOL 10 MG/ML IV BOLUS
INTRAVENOUS | Status: AC
  Filled 2016-11-21: qty 20

## 2016-11-21 MED ORDER — MORPHINE SULFATE (PF) 4 MG/ML IV SOLN
4.0000 mg | Freq: Once | INTRAVENOUS | Status: AC
  Administered 2016-11-21: 4 mg via INTRAVENOUS
  Filled 2016-11-21: qty 1

## 2016-11-21 MED ORDER — SOLIFENACIN SUCCINATE 5 MG PO TABS: 5.0000 mg | ORAL_TABLET | Freq: Every day | ORAL | Status: DC

## 2016-11-21 MED ORDER — IOPAMIDOL (ISOVUE-300) INJECTION 61%
INTRAVENOUS | Status: AC
  Administered 2016-11-21: 100 mL
  Filled 2016-11-21: qty 100

## 2016-11-21 MED ORDER — MIDAZOLAM HCL 2 MG/2ML IJ SOLN
INTRAMUSCULAR | Status: AC
  Filled 2016-11-21: qty 2

## 2016-11-21 MED ORDER — SUFENTANIL CITRATE 50 MCG/ML IV SOLN
INTRAVENOUS | Status: AC
  Filled 2016-11-21: qty 1

## 2016-11-21 MED ORDER — PIPERACILLIN-TAZOBACTAM 3.375 G IVPB 30 MIN
3.3750 g | Freq: Once | INTRAVENOUS | Status: AC
  Administered 2016-11-21: 3.375 g via INTRAVENOUS
  Filled 2016-11-21: qty 50

## 2016-11-21 SURGICAL SUPPLY — 48 items
BLADE CLIPPER SURG (BLADE) IMPLANT
CANISTER SUCT 3000ML PPV (MISCELLANEOUS) ×3 IMPLANT
CHLORAPREP W/TINT 26ML (MISCELLANEOUS) ×3 IMPLANT
COVER SURGICAL LIGHT HANDLE (MISCELLANEOUS) ×3 IMPLANT
DRAIN CHANNEL 19F RND (DRAIN) ×3 IMPLANT
DRAPE LAPAROSCOPIC ABDOMINAL (DRAPES) ×3 IMPLANT
DRAPE WARM FLUID 44X44 (DRAPE) ×3 IMPLANT
DRSG OPSITE POSTOP 4X10 (GAUZE/BANDAGES/DRESSINGS) IMPLANT
DRSG OPSITE POSTOP 4X8 (GAUZE/BANDAGES/DRESSINGS) IMPLANT
DRSG PAD ABDOMINAL 8X10 ST (GAUZE/BANDAGES/DRESSINGS) ×3 IMPLANT
ELECT BLADE 6.5 EXT (BLADE) IMPLANT
ELECT CAUTERY BLADE 6.4 (BLADE) ×3 IMPLANT
ELECT REM PT RETURN 9FT ADLT (ELECTROSURGICAL) ×3
ELECTRODE REM PT RTRN 9FT ADLT (ELECTROSURGICAL) ×1 IMPLANT
EVACUATOR SILICONE 100CC (DRAIN) ×3 IMPLANT
GAUZE SPONGE 4X4 12PLY STRL (GAUZE/BANDAGES/DRESSINGS) ×3 IMPLANT
GLOVE BIO SURGEON STRL SZ 6 (GLOVE) ×3 IMPLANT
GLOVE BIOGEL PI IND STRL 6.5 (GLOVE) ×1 IMPLANT
GLOVE BIOGEL PI INDICATOR 6.5 (GLOVE) ×2
GLOVE SURG SIGNA 7.5 PF LTX (GLOVE) ×3 IMPLANT
GOWN STRL REUS W/ TWL LRG LVL3 (GOWN DISPOSABLE) ×2 IMPLANT
GOWN STRL REUS W/TWL LRG LVL3 (GOWN DISPOSABLE) ×4
KIT BASIN OR (CUSTOM PROCEDURE TRAY) ×3 IMPLANT
KIT ROOM TURNOVER OR (KITS) ×3 IMPLANT
LIGASURE IMPACT 36 18CM CVD LR (INSTRUMENTS) ×3 IMPLANT
NS IRRIG 1000ML POUR BTL (IV SOLUTION) ×6 IMPLANT
PACK GENERAL/GYN (CUSTOM PROCEDURE TRAY) ×3 IMPLANT
PAD ARMBOARD 7.5X6 YLW CONV (MISCELLANEOUS) ×3 IMPLANT
RELOAD STAPLER BLUE 60MM (STAPLE) ×3 IMPLANT
RELOAD STAPLER GREEN 60MM (STAPLE) ×3 IMPLANT
SPECIMEN JAR LARGE (MISCELLANEOUS) ×3 IMPLANT
SPONGE LAP 18X18 X RAY DECT (DISPOSABLE) ×6 IMPLANT
STAPLE ECHEON FLEX 60 POW ENDO (STAPLE) ×3 IMPLANT
STAPLER RELOAD BLUE 60MM (STAPLE) ×9
STAPLER RELOAD GREEN 60MM (STAPLE) ×9
STAPLER VISISTAT 35W (STAPLE) ×3 IMPLANT
SUCTION POOLE TIP (SUCTIONS) ×3 IMPLANT
SUT ETHILON 2 0 FS 18 (SUTURE) ×3 IMPLANT
SUT PDS AB 1 CT  36 (SUTURE) ×2
SUT PDS AB 1 CT 36 (SUTURE) ×1 IMPLANT
SUT PDS AB 1 TP1 96 (SUTURE) ×6 IMPLANT
SUT SILK 2 0 SH CR/8 (SUTURE) ×3 IMPLANT
SUT SILK 2 0 TIES 10X30 (SUTURE) ×3 IMPLANT
SUT SILK 3 0 SH CR/8 (SUTURE) ×3 IMPLANT
SUT SILK 3 0 TIES 10X30 (SUTURE) ×3 IMPLANT
SUT VIC AB 3-0 SH 18 (SUTURE) ×3 IMPLANT
TOWEL OR 17X26 10 PK STRL BLUE (TOWEL DISPOSABLE) ×3 IMPLANT
TRAY FOLEY W/METER SILVER 16FR (SET/KITS/TRAYS/PACK) IMPLANT

## 2016-11-21 NOTE — H&P (Signed)
Surgical H&P  CC: abdominal pain  HPI: 76 year old woman with complex abdominal surgical history who presents with 2 days of upper abdominal pain which is progressively worsened. Associated with chills. The pain is in the upper abdomen and is aggravated by any movement or palpation.  She has a history of an antecolic antegastric Roux-en-Y gastric bypass about 15 years ago. This was complicated by recalcitrant marginal ulcers with multiple episodes of bleeding. She has undergone laparoscopic revision of her gastrojejunostomy twice, the most recent was about a year ago. According to her surgeon, Dr. Dorthula Rue in [facility], she at one point had a CT angiogram which gave concern that the left gastric artery was out, however they did do firefly during her last revision and the pouch appeared to be very well perfused. He also states that at one point she had a very severe posterior ulcer which had eroded into the pancreas.  She is recently moved down here to be closer to her daughter who is her healthcare proxyand helps her manage her multiple medical problems. She was actually slated to undergo upper endoscopy tomorrow with Dr. Loletha Carrow to assess ulcer healing as she most recently had a flare in September of this year; as she is hoping to have a right hip replacement in the near future. She remains on a baby aspirin due to her history of an MI that omately 18 months ago. At that time she had new onset A. Fib as well. She was momentarily on Coumadin which was then discontinued after placement of the left atrial appendage watchman device sometime in the last few months.  Allergies  Allergen Reactions  . Demerol [Meperidine] Anaphylaxis    Past Medical History:  Diagnosis Date  . Anemia   . Arthritis    RA  . Diverticulosis   . Duodenal ulcer   . Dyspnea   . Dysrhythmia    a fib  . GERD (gastroesophageal reflux disease)   . GI bleed   . Gout   . Heart attack ([place])   . Hx of blood clots    in  heart  . IBS (irritable bowel syndrome)   . Neuromuscular disorder ([facility])    neuropathy  . Presence of permanent cardiac pacemaker    medtronic implanted october 2017   Serial # [id number]  . Stomach ulcer     Past Surgical History:  Procedure Laterality Date  . ABDOMINAL HYSTERECTOMY    . APPENDECTOMY    . BARIATRIC SURGERY    . CHOLECYSTECTOMY    . fusion r foot    . HERNIA REPAIR    . INSERT / REPLACE / REMOVE PACEMAKER     med tronic  . JOINT REPLACEMENT     bil TKA  . SHOULDER SURGERY     reverse shoulder replacement  . SPINAL FUSION     x2 lumbar  . watchman device      placed  via groin    Family History  Problem Relation Age of Onset  . Heart disease Mother   . Liver disease Father   . Ulcers Father   . Prostate cancer Brother   . Colon cancer Neg Hx     Social History   Socioeconomic History  . Marital status: Widowed    Spouse name: None  . Number of children: None  . Years of education: None  . Highest education level: None  Social Needs  . Financial resource strain: None  . Food insecurity - worry: None  .  Food insecurity - inability: None  . Transportation needs - medical: None  . Transportation needs - non-medical: None  Occupational History  . None  Tobacco Use  . Smoking status: Never Smoker  . Smokeless tobacco: Never Used  Substance and Sexual Activity  . Alcohol use: No    Frequency: Never  . Drug use: No  . Sexual activity: Not Currently  Other Topics Concern  . None  Social History Narrative  . None    Current Facility-Administered Medications on File Prior to Encounter  Medication Dose Route Frequency Provider Last Rate Last Dose  . solifenacin (VESICARE) tablet 5 mg  5 mg Oral Daily Nuala Alpha, DO       Current Outpatient Medications on File Prior to Encounter  Medication Sig Dispense Refill  . acetaminophen (TYLENOL) 500 MG tablet Take 1,000 mg every 6 (six) hours as needed by mouth (pain).     .  Alpha-D-Galactosidase (BEANO) TABS Take 2 tablets 3 (three) times daily as needed by mouth (gas).     Marland Kitchen aspirin EC 81 MG tablet Take 81 mg daily by mouth.    Marland Kitchen aspirin EC 81 MG tablet Take 324 mg once by mouth.    . cetirizine (ZYRTEC) 10 MG tablet Take 10 mg daily by mouth.    . colchicine 0.6 MG tablet Take 0.6 mg 2 (two) times daily as needed by mouth (gout attacks).     . ferrous sulfate 325 (65 FE) MG tablet Take 325 mg daily by mouth.    . flintstones complete (FLINTSTONES) 60 MG chewable tablet Chew 1 tablet 3 (three) times daily by mouth.    . gabapentin (NEURONTIN) 300 MG capsule Take 1 capsule (300 mg total) 3 (three) times daily by mouth. 90 capsule 3  . HYDROcodone-acetaminophen (NORCO/VICODIN) 5-325 MG tablet Take 1 tablet every 6 (six) hours as needed by mouth for severe pain.     Marland Kitchen loperamide (IMODIUM) 2 MG capsule Take 2 mg 3 (three) times daily as needed by mouth for diarrhea or loose stools.    . mirabegron ER (MYRBETRIQ) 50 MG TB24 tablet Take 1 tablet (50 mg total) daily by mouth. 30 tablet 3  . Multiple Vitamin (MULTIVITAMIN WITH MINERALS) TABS tablet Take 1 tablet daily by mouth. Centrum 50+    . omeprazole (PRILOSEC) 20 MG capsule Take 20 mg daily before breakfast by mouth.     Marland Kitchen OVER THE COUNTER MEDICATION Take See admin instructions by mouth. Take 2 drops CBD oil 500 by mouth twice daily (for pain)    . OVER THE COUNTER MEDICATION Take See admin instructions by mouth. Take one CBD gummie by mouth four times daily for pain    . rOPINIRole (REQUIP) 0.5 MG tablet Take 1 tablet (0.5 mg total) 3 (three) times daily by mouth. (Patient taking differently: Take 0.5-1 mg See admin instructions by mouth. Take 1 tablet (0.5 mg) in the afternoon & 2 tablets (1 mg) by mouth in the evening.) 30 tablet 3  . sucralfate (CARAFATE) 1 GM/10ML suspension Take 1 g 4 (four) times daily by mouth.      Review of Systems: a complete, 10pt review of systems was completed with pertinent positives  and negatives as documented in the HPI  Physical Exam: Vitals:   11/21/16 2300 11/21/16 2315  BP: 110/67 101/66  Pulse:    Resp: 20 17  Temp:    SpO2:     Gen: A&Ox3, no distress Head: normocephalic, atraumatic, EOMI, anicteric.  Neck: supple  without mass or thyromegaly Chest: unlabored respirations, symmetrical air entry   Cardiovascular: tachycardic in atrial fibrillation with a rate of about 120,with palpable distal pulses, no pedal edema Abdomen: soft, mildly distended, tender with guarding and upper midline and left upper fields. No mass or organomegaly.  Extremities: warm, without edema, no deformities  Neuro: grossly intact Psych: appropriate mood and affect  Skin: warm and dry   CBC Latest Ref Rng & Units 11/21/2016 11/17/2016  WBC 4.0 - 10.5 K/uL 12.3(H) 6.6  Hemoglobin 12.0 - 15.0 g/dL 14.5 12.5  Hematocrit 36.0 - 46.0 % 44.7 38.6  Platelets 150 - 400 K/uL 447(H) 418.0(H)    CMP Latest Ref Rng & Units 11/21/2016 11/17/2016  Glucose 65 - 99 mg/dL 117(H) 97  BUN 6 - 20 mg/dL 19 21  Creatinine 0.44 - 1.00 mg/dL 0.97 0.75  Sodium 135 - 145 mmol/L 139 141  Potassium 3.5 - 5.1 mmol/L 3.6 4.6  Chloride 101 - 111 mmol/L 107 109  CO2 22 - 32 mmol/L 21(L) 23  Calcium 8.9 - 10.3 mg/dL 9.4 9.3    Lab Results  Component Value Date   INR 0.88 11/21/2016    Imaging: Dg Chest 2 View  Result Date: 11/21/2016 CLINICAL DATA:  76 year old female with history of atrial fibrillation. EXAM: CHEST  2 VIEW COMPARISON:  No priors. FINDINGS: Lung volumes are low. Mild elevation of the right hemidiaphragm. No acute consolidative airspace disease. No pleural effusions. No evidence of pulmonary edema. Heart size is mildly enlarged with left atrial dilatation. The patient is rotated to the right on today's exam, resulting in distortion of the mediastinal contours and reduced diagnostic sensitivity and specificity for mediastinal pathology. Aortic atherosclerosis. Left-sided pacemaker  device in place with lead tips projecting over the expected location of the right atrial appendage and near the right ventricular apex. Status post bilateral shoulder arthroplasty. IMPRESSION: 1. Lung volumes with no radiographic evidence of acute cardiopulmonary disease. 2. Cardiomegaly with left atrial dilatation. 3. Aortic atherosclerosis. Electronically Signed   By: Vinnie Langton M.D.   On: 11/21/2016 21:47   Ct Abdomen Pelvis W Contrast  Result Date: 11/21/2016 CLINICAL DATA:  Abdominal pain for 2 days, atrial fibrillation. History of duodenum ulcer, diverticulosis, gastric bypass, cholecystectomy, appendectomy. EXAM: CT ABDOMEN AND PELVIS WITH CONTRAST TECHNIQUE: Multidetector CT imaging of the abdomen and pelvis was performed using the standard protocol following bolus administration of intravenous contrast. CONTRAST:  120mL ISOVUE-300 IOPAMIDOL (ISOVUE-300) INJECTION 61% COMPARISON:  None. FINDINGS: LOWER CHEST: RIGHT lung base dependent atelectasis. Included heart size is moderately enlarged. No parapharyngeal effusion. Pacemaker in place. HEPATOBILIARY: Status post cholecystectomy. Moderate biliary dilatation may be post procedural. PANCREAS: Atrophic, nonacute. SPLEEN: Normal. ADRENALS/URINARY TRACT: Kidneys are orthotopic, demonstrating symmetric enhancement. Severe scarring bilateral kidneys with focal atrophy LEFT interpolar kidney. 15 mm RIGHT interpolar cyst. No nephrolithiasis, hydronephrosis or solid renal masses. The unopacified ureters are normal in course and caliber. Delayed imaging through the kidneys demonstrates symmetric prompt contrast excretion within the proximal urinary collecting system. Urinary bladder is partially distended and unremarkable. Normal adrenal glands. STOMACH/BOWEL: Assessment limited without oral contrast. Moderate hiatal hernia. Status post apparent cast dura jejunostomy. Focal 2.8 cm cyst gastric anastomosis. Extensive gastric luminal hyper re- media and  inflammatory changes in the upper abdomen. Focal fluid and inflammation at proximal duodenum. 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 Severe colonic diverticulosis. VASCULAR/LYMPHATIC: 3.5 cm infrarenal aortic aneurysm. SMA and IMA not identified though not tailored for evaluation. No lymphadenopathy by CT size criteria.  REPRODUCTIVE: Status post hysterectomy. OTHER: Moderate intraperitoneal free air predominately in the upper abdomen. Small amount of ascites. No drainable fluid collection. Small fat containing LEFT inguinal hernia. Small fat containing umbilical hernia. MUSCULOSKELETAL: Nonacute. Old LEFT superior and inferior pubic rami fractures. Sub chronic deformities of bilateral iliac bone, possible donor site. Status post L3-4 PLIF. Broad levoscoliosis. Severe RIGHT, moderate LEFT hip osteoarthrosis. Asymmetric RIGHT pelvic muscle atrophy. RIGHT hip effusion. Old RIGHT rib fractures. Subcentimeter sclerotic focus LEFT fifth rib. IMPRESSION: 1. Pneumoperitoneum and extensive inflammatory changes in the upper abdomen consistent with stomach or proximal small bowel perforation. Status post gastrojejunostomy and suspected extensive revisions. No abscess. 2. Status post cholecystectomy with greater than expected though potentially postprocedural intrahepatic biliary dilatation. 3. **An incidental finding of potential clinical significance has been found. 3.5 cm infrarenal aortic aneurysm. Recommend followup by ultrasound in 2 years. This recommendation follows ACR consensus guidelines: White Paper of the ACR Incidental Findings Committee II on Vascular Findings. J Am Coll Radiol 2013; 10:789-794.** 4. Sclerotic lesion LEFT anterior fifth rib. If there is a history of cancer, consider bone scan. 5. Critical Value/emergent results were called by telephone at the time of interpretation on 11/21/2016 at 11:00 pm to Dr. Virgel Manifold , who verbally acknowledged these results. Electronically Signed   By: Elon Alas M.D.   On: 11/21/2016 23:92    A/P: 76 year old lady with perforated viscus, suspect the problem is at her [place] given her long history of severe recalcitrant marginal ulcers. I discussed with her exploratory laparotomy with the goal of identifying and controlling the leak. I do not anticipate that a revision will fare well in the setting. We discussed the risks of surgery including heart attack, stroke, pneumonia, blood clot and death, and discussed the risks of nonoperative management including worsening sepsis and death. She had a daughter agree to proceed. She states explicitly that her daughter can speak for her in the event that she is unable to make her own healthcare decisions. Will proceed to the OR this evening.  Following surgery she will be admitted, will plan to place a PICC line, continue IV PPI and antibiotics and consult hospitalist for assistance with her medical comorbidities.   Romana Juniper, MD [facility], [place] Pager [phone]

## 2016-11-21 NOTE — ED Triage Notes (Signed)
Pt from home with EMS c.o abdominal pain that started about 2 days ago along with atrial fibrillation that is usually triggered from her pain. Pt has hx of stomach ulcers in which she has received blood transfusions for in the past. HR ranging from 90-130bpm, BP 132/74, 98% on room air. Pain 10/10 in LUQ. Pt denies nausea or vomiting at this time. Pt also reports left shoulder pain 10/10. Pt alert and oriented upon arrival, nad

## 2016-11-21 NOTE — ED Provider Notes (Signed)
[facility] EMERGENCY DEPARTMENT Provider Note   CSN: [id number] Arrival date & time: 11/21/16  2051     History   Chief Complaint Chief Complaint  Patient presents with  . Atrial Fibrillation  . Abdominal Pain    HPI Deanna Spencer is a 76 y.o. female.  HPI   76 year old female with abdominal pain.  Epigastric to LUQ with radiation into L shoulder. Beginning yesterday and progressively worsening.  She has a complicated past history.  She recently relocated from [place] and is in the process of establishing care here locally.   From her description it sounds like she had gastric bypass and revision complicated by ulcers.  She has previously required numerous transfusions.  She denies any recent bright red blood per rectum or melena.  She has a past history of A. Fib .  She is not anticoagulated secondary to the GI bleeding.  She is status post Watchman device.  She is in the process of establishing local GI care and is actually scheduled for an endoscopy with Dr. Loletha Carrow tomorrow.  Past Medical History:  Diagnosis Date  . Anemia   . Arthritis    RA  . Diverticulosis   . Duodenal ulcer   . Dyspnea   . Dysrhythmia    a fib  . GERD (gastroesophageal reflux disease)   . GI bleed   . Gout   . Heart attack ([place])   . Hx of blood clots    in heart  . IBS (irritable bowel syndrome)   . Neuromuscular disorder (H[facility])    neuropathy  . Presence of permanent cardiac pacemaker    medtronic implanted october 2017   Serial # [id number]  . Stomach ulcer     Patient Active Problem List   Diagnosis Date Noted  . Other chronic pain 11/18/2016  . 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 History of heart attack 11/18/2016  . Parasomnia 11/18/2016  . Need for immunization against influenza 11/18/2016  . Encounter to establish care 11/18/2016  . History of duodenal ulcer 11/18/2016    Past Surgical History:  Procedure  Laterality Date  . ABDOMINAL HYSTERECTOMY    . APPENDECTOMY    . BARIATRIC SURGERY    . CHOLECYSTECTOMY    . fusion r foot    . HERNIA REPAIR    . INSERT / REPLACE / REMOVE PACEMAKER     med tronic  . JOINT REPLACEMENT     bil TKA  . SHOULDER SURGERY     reverse shoulder replacement  . SPINAL FUSION     x2 lumbar  . watchman device      placed  via groin    OB History    No data available       Home Medications    Prior to Admission medications   Medication Sig Start Date End Date Taking? Authorizing Provider  acetaminophen (TYLENOL) 500 MG tablet Take 1,000 mg every 6 (six) hours as needed by mouth (for pain.).     [provider]  Alpha-D-Galactosidase Satira Mccallum) TABS Take 2 tablets 3 (three) times daily as needed by mouth (gas).     [provider]  aspirin EC 81 MG tablet Take 81 mg daily by mouth.    [provider]  cetirizine (ZYRTEC) 10 MG tablet Take 10 mg daily by mouth.    [provider]  colchicine 0.6 MG tablet Take 0.6 mg 2 (two) times  daily as needed by mouth (for gout attacks).     [provider]  ferrous sulfate 325 (65 FE) MG tablet Take 325 mg daily by mouth.    [provider]  flintstones complete (FLINTSTONES) 60 MG chewable tablet Chew 1 tablet 3 (three) times daily by mouth.    [provider]  gabapentin (NEURONTIN) 300 MG capsule Take 1 capsule (300 mg total) 3 (three) times daily by mouth. 11/15/16   Nuala Alpha, DO  HYDROcodone-acetaminophen (NORCO/VICODIN) 5-325 MG tablet Take 1 tablet every 6 (six) hours as needed by mouth for severe pain.     [provider]  loperamide (IMODIUM) 2 MG capsule Take 2 mg 3 (three) times daily as needed by mouth for diarrhea or loose stools.    [provider]  mirabegron ER (MYRBETRIQ) 50 MG TB24 tablet Take 1 tablet (50 mg total) daily by mouth. 11/15/16   Nuala Alpha, DO  Multiple Vitamin (MULTIVITAMIN WITH MINERALS) TABS  tablet Take 1 tablet daily by mouth. Centrum 50+    [provider]  omeprazole (PRILOSEC) 20 MG capsule Take 20 mg daily before breakfast by mouth.     [provider]  rOPINIRole (REQUIP) 0.5 MG tablet Take 1 tablet (0.5 mg total) 3 (three) times daily by mouth. Patient taking differently: Take 0.5-1 mg 2 (two) times daily by mouth. Take 1 tablet (0.5 mg) in the afternoon & 2 tablets (1 mg) by mouth in the evening. 11/15/16   Nuala Alpha, DO  sucralfate (CARAFATE) 1 GM/10ML suspension Take 1 g 4 (four) times daily by mouth.    [provider]    Family History Family History  Problem Relation Age of Onset  . Heart disease Mother   . Liver disease Father   . Ulcers Father   . Prostate cancer Brother   . Colon cancer Neg Hx     Social History Social History   Tobacco Use  . Smoking status: Never Smoker  . Smokeless tobacco: Never Used  Substance Use Topics  . Alcohol use: No    Frequency: Never  . Drug use: No     Allergies   Demerol [meperidine]   Review of Systems Review of Systems  All systems reviewed and negative, other than as noted in HPI.  Physical Exam Updated Vital Signs BP (!) 145/114 (BP Location: Left Arm)   Pulse (!) 115   Temp 97.6 F (36.4 C) (Oral)   Resp 14   Ht 5' (1.524 m)   Wt 52.2 kg (115 lb)   SpO2 97%   BMI 22.46 kg/m   Physical Exam  Constitutional: She appears well-developed and well-nourished. No distress.  HENT:  Head: Normocephalic and atraumatic.  Eyes: Conjunctivae are normal. Right eye exhibits no discharge. Left eye exhibits no discharge.  Neck: Neck supple.  Cardiovascular: Normal heart sounds. Exam reveals no gallop and no friction rub.  No murmur heard. Tachycardic.  Irregularly irregular.  Pulmonary/Chest: Effort normal and breath sounds normal. No respiratory distress.  Abdominal: She exhibits no distension. There is tenderness.  Umbilical, epigastric and left upper quadrant tenderness  with guarding on deep palpation.  Musculoskeletal: She exhibits no edema or tenderness.  Neurological: She is alert.  Skin: Skin is warm and dry.  Psychiatric: She has a normal mood and affect. Her behavior is normal. Thought content normal.  Nursing note and vitals reviewed.    ED Treatments / Results  Labs (all labs ordered are listed, but only abnormal results are  displayed) Labs Reviewed  BASIC METABOLIC PANEL - Abnormal; Notable for the following components:      Result Value   CO2 21 (*)    Glucose, Bld 117 (*)    GFR calc non Af Amer 56 (*)    All other components within normal limits  CBC - Abnormal; Notable for the following components:   WBC 12.3 (*)    RDW 18.9 (*)    Platelets 447 (*)    All other components within normal limits  PROTIME-INR  LIPASE, BLOOD  URINALYSIS, ROUTINE W REFLEX MICROSCOPIC  I-STAT TROPONIN, ED  TYPE AND SCREEN    EKG  EKG Interpretation None       Radiology Dg Chest 2 View  Result Date: 11/21/2016 CLINICAL DATA:  76 year old female with history of atrial fibrillation. EXAM: CHEST  2 VIEW COMPARISON:  No priors. FINDINGS: Lung volumes are low. Mild elevation of the right hemidiaphragm. No acute consolidative airspace disease. No pleural effusions. No evidence of pulmonary edema. Heart size is mildly enlarged with left atrial dilatation. The patient is rotated to the right on today's exam, resulting in distortion of the mediastinal contours and reduced diagnostic sensitivity and specificity for mediastinal pathology. Aortic atherosclerosis. Left-sided pacemaker device in place with lead tips projecting over the expected location of the right atrial appendage and near the right ventricular apex. Status post bilateral shoulder arthroplasty. IMPRESSION: 1. Lung volumes with no radiographic evidence of acute cardiopulmonary disease. 2. Cardiomegaly with left atrial dilatation. 3. Aortic atherosclerosis. Electronically Signed   By: Vinnie Langton M.D.   On: 11/21/2016 21:47   Ct Abdomen Pelvis W Contrast  Result Date: 11/21/2016 CLINICAL DATA:  Abdominal pain for 2 days, atrial fibrillation. History of duodenum ulcer, diverticulosis, gastric bypass, cholecystectomy, appendectomy. EXAM: CT ABDOMEN AND PELVIS WITH CONTRAST TECHNIQUE: Multidetector CT imaging of the abdomen and pelvis was performed using the standard protocol following bolus administration of intravenous contrast. CONTRAST:  122mL ISOVUE-300 IOPAMIDOL (ISOVUE-300) INJECTION 61% COMPARISON:  None. FINDINGS: LOWER CHEST: RIGHT lung base dependent atelectasis. Included heart size is moderately enlarged. No parapharyngeal effusion. Pacemaker in place. HEPATOBILIARY: Status post cholecystectomy. Moderate biliary dilatation may be post procedural. PANCREAS: Atrophic, nonacute. SPLEEN: Normal. ADRENALS/URINARY TRACT: Kidneys are orthotopic, demonstrating symmetric enhancement. Severe scarring bilateral kidneys with focal atrophy LEFT interpolar kidney. 15 mm RIGHT interpolar cyst. No nephrolithiasis, hydronephrosis or solid renal masses. The unopacified ureters are normal in course and caliber. Delayed imaging through the kidneys demonstrates symmetric prompt contrast excretion within the proximal urinary collecting system. Urinary bladder is partially distended and unremarkable. Normal adrenal glands. STOMACH/BOWEL: Assessment limited without oral contrast. Moderate hiatal hernia. Status post apparent cast dura jejunostomy. Focal 2.8 cm cyst gastric anastomosis. Extensive gastric luminal hyper re- media and inflammatory changes in the upper abdomen. Focal fluid and inflammation at proximal duodenum. 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 Severe colonic diverticulosis. VASCULAR/LYMPHATIC: 3.5 cm infrarenal aortic aneurysm. SMA and IMA not identified though not tailored for evaluation. No lymphadenopathy by CT size criteria. REPRODUCTIVE: Status post hysterectomy. OTHER: Moderate  intraperitoneal free air predominately in the upper abdomen. Small amount of ascites. No drainable fluid collection. Small fat containing LEFT inguinal hernia. Small fat containing umbilical hernia. MUSCULOSKELETAL: Nonacute. Old LEFT superior and inferior pubic rami fractures. Sub chronic deformities of bilateral iliac bone, possible donor site. Status post L3-4 PLIF. Broad levoscoliosis. Severe RIGHT, moderate LEFT hip osteoarthrosis. Asymmetric RIGHT pelvic muscle atrophy. RIGHT hip effusion. Old RIGHT rib fractures. Subcentimeter sclerotic focus LEFT fifth  rib. IMPRESSION: 1. Pneumoperitoneum and extensive inflammatory changes in the upper abdomen consistent with stomach or proximal small bowel perforation. Status post gastrojejunostomy and suspected extensive revisions. No abscess. 2. Status post cholecystectomy with greater than expected though potentially postprocedural intrahepatic biliary dilatation. 3. **An incidental finding of potential clinical significance has been found. 3.5 cm infrarenal aortic aneurysm. Recommend followup by ultrasound in 2 years. This recommendation follows ACR consensus guidelines: White Paper of the ACR Incidental Findings Committee II on Vascular Findings. J Am Coll Radiol 2013; 10:789-794.** 4. Sclerotic lesion LEFT anterior fifth rib. If there is a history of cancer, consider bone scan. 5. Critical Value/emergent results were called by telephone at the time of interpretation on 11/21/2016 at 11:00 pm to Dr. Virgel Manifold , who verbally acknowledged these results. Electronically Signed   By: Elon Alas M.D.   On: 11/21/2016 23:04    Procedures Procedures (including critical care time)  CRITICAL CARE Performed by: Virgel Manifold Total critical care time: 35 minutes Critical care time was exclusive of separately billable procedures and treating other patients. Critical care was necessary to treat or prevent imminent or life-threatening deterioration. Critical  care was time spent personally by me on the following activities: development of treatment plan with patient and/or surrogate as well as nursing, discussions with consultants, evaluation of patient's response to treatment, examination of patient, obtaining history from patient or surrogate, ordering and performing treatments and interventions, ordering and review of laboratory studies, ordering and review of radiographic studies, pulse oximetry and re-evaluation of patient's condition.   Medications Ordered in ED Medications - No data to display   Initial Impression / Assessment and Plan / ED Course  I have reviewed the triage vital signs and the nursing notes.  Pertinent labs & imaging results that were available during my care of the patient were reviewed by me and considered in my medical decision making (see chart for details).     76 year old female with upper abdominal pain with radiation to her shoulder.  Free air on imaging.  Discussed with surgery.  Antibiotics.  Patient/daughter appraised of situation.  Final Clinical Impressions(s) / ED Diagnoses   Final diagnoses:  Perforated viscus    ED Discharge Orders    None       Virgel Manifold, MD 11/21/16 2317

## 2016-11-21 NOTE — Progress Notes (Signed)
Patient sent in message requesting an alternative to mirabegron due to cost.  I wanted to avoid an anticholinergic due to patient age however since she is having symptoms I am sending in solifenacen to try.  I did discuss side effect with patient via message.

## 2016-11-22 ENCOUNTER — Encounter (HOSPITAL_COMMUNITY): Admission: EM | Disposition: A | Discharge status: Home / Self Care | Payer: Self-pay | Source: Home / Self Care

## 2016-11-22 ENCOUNTER — Emergency Department (HOSPITAL_COMMUNITY): Payer: Medicare Other | Admitting: Certified Registered"

## 2016-11-22 ENCOUNTER — Ambulatory Visit (HOSPITAL_COMMUNITY): Admission: RE | Admit: 2016-11-22 | Payer: Medicare Other | Source: Ambulatory Visit | Admitting: Gastroenterology

## 2016-11-22 ENCOUNTER — Encounter (HOSPITAL_COMMUNITY): Payer: Self-pay | Admitting: Certified Registered"

## 2016-11-22 DIAGNOSIS — I10 Essential (primary) hypertension: Secondary | ICD-10-CM | POA: Diagnosis present

## 2016-11-22 DIAGNOSIS — I482 Chronic atrial fibrillation: Secondary | ICD-10-CM

## 2016-11-22 DIAGNOSIS — R198 Other specified symptoms and signs involving the digestive system and abdomen: Secondary | ICD-10-CM | POA: Diagnosis not present

## 2016-11-22 DIAGNOSIS — I2510 Atherosclerotic heart disease of native coronary artery without angina pectoris: Secondary | ICD-10-CM | POA: Diagnosis present

## 2016-11-22 DIAGNOSIS — I341 Nonrheumatic mitral (valve) prolapse: Secondary | ICD-10-CM | POA: Diagnosis not present

## 2016-11-22 DIAGNOSIS — Z981 Arthrodesis status: Secondary | ICD-10-CM | POA: Diagnosis not present

## 2016-11-22 DIAGNOSIS — Z9889 Other specified postprocedural states: Secondary | ICD-10-CM

## 2016-11-22 DIAGNOSIS — N3289 Other specified disorders of bladder: Secondary | ICD-10-CM | POA: Diagnosis not present

## 2016-11-22 DIAGNOSIS — Z885 Allergy status to narcotic agent status: Secondary | ICD-10-CM | POA: Diagnosis not present

## 2016-11-22 DIAGNOSIS — Z950 Presence of cardiac pacemaker: Secondary | ICD-10-CM | POA: Diagnosis not present

## 2016-11-22 DIAGNOSIS — K219 Gastro-esophageal reflux disease without esophagitis: Secondary | ICD-10-CM

## 2016-11-22 DIAGNOSIS — T8132XA Disruption of internal operation (surgical) wound, not elsewhere classified, initial encounter: Secondary | ICD-10-CM | POA: Diagnosis not present

## 2016-11-22 DIAGNOSIS — G6289 Other specified polyneuropathies: Secondary | ICD-10-CM

## 2016-11-22 DIAGNOSIS — G629 Polyneuropathy, unspecified: Secondary | ICD-10-CM

## 2016-11-22 DIAGNOSIS — Z90710 Acquired absence of both cervix and uterus: Secondary | ICD-10-CM | POA: Diagnosis not present

## 2016-11-22 DIAGNOSIS — I4891 Unspecified atrial fibrillation: Secondary | ICD-10-CM | POA: Diagnosis not present

## 2016-11-22 DIAGNOSIS — K631 Perforation of intestine (nontraumatic): Secondary | ICD-10-CM | POA: Diagnosis not present

## 2016-11-22 DIAGNOSIS — R1012 Left upper quadrant pain: Secondary | ICD-10-CM | POA: Diagnosis not present

## 2016-11-22 DIAGNOSIS — Z7982 Long term (current) use of aspirin: Secondary | ICD-10-CM | POA: Diagnosis not present

## 2016-11-22 DIAGNOSIS — K598 Other specified functional intestinal disorders: Secondary | ICD-10-CM | POA: Diagnosis not present

## 2016-11-22 DIAGNOSIS — R109 Unspecified abdominal pain: Secondary | ICD-10-CM | POA: Diagnosis not present

## 2016-11-22 DIAGNOSIS — Z8249 Family history of ischemic heart disease and other diseases of the circulatory system: Secondary | ICD-10-CM | POA: Diagnosis not present

## 2016-11-22 DIAGNOSIS — K5790 Diverticulosis of intestine, part unspecified, without perforation or abscess without bleeding: Secondary | ICD-10-CM | POA: Diagnosis not present

## 2016-11-22 DIAGNOSIS — K274 Chronic or unspecified peptic ulcer, site unspecified, with hemorrhage: Secondary | ICD-10-CM | POA: Diagnosis not present

## 2016-11-22 DIAGNOSIS — M069 Rheumatoid arthritis, unspecified: Secondary | ICD-10-CM | POA: Diagnosis not present

## 2016-11-22 DIAGNOSIS — K9589 Other complications of other bariatric procedure: Secondary | ICD-10-CM | POA: Diagnosis not present

## 2016-11-22 DIAGNOSIS — M109 Gout, unspecified: Secondary | ICD-10-CM | POA: Diagnosis not present

## 2016-11-22 DIAGNOSIS — Z95818 Presence of other cardiac implants and grafts: Secondary | ICD-10-CM | POA: Diagnosis not present

## 2016-11-22 DIAGNOSIS — K251 Acute gastric ulcer with perforation: Secondary | ICD-10-CM | POA: Diagnosis not present

## 2016-11-22 DIAGNOSIS — Z96653 Presence of artificial knee joint, bilateral: Secondary | ICD-10-CM | POA: Diagnosis present

## 2016-11-22 DIAGNOSIS — Y733 Surgical instruments, materials and gastroenterology and urology devices (including sutures) associated with adverse incidents: Secondary | ICD-10-CM | POA: Diagnosis present

## 2016-11-22 DIAGNOSIS — K633 Ulcer of intestine: Secondary | ICD-10-CM | POA: Diagnosis not present

## 2016-11-22 DIAGNOSIS — I252 Old myocardial infarction: Secondary | ICD-10-CM

## 2016-11-22 DIAGNOSIS — M1611 Unilateral primary osteoarthritis, right hip: Secondary | ICD-10-CM | POA: Diagnosis not present

## 2016-11-22 DIAGNOSIS — R1084 Generalized abdominal pain: Secondary | ICD-10-CM | POA: Diagnosis not present

## 2016-11-22 DIAGNOSIS — Z8711 Personal history of peptic ulcer disease: Secondary | ICD-10-CM | POA: Diagnosis not present

## 2016-11-22 DIAGNOSIS — Z8042 Family history of malignant neoplasm of prostate: Secondary | ICD-10-CM | POA: Diagnosis not present

## 2016-11-22 DIAGNOSIS — Z9049 Acquired absence of other specified parts of digestive tract: Secondary | ICD-10-CM | POA: Diagnosis not present

## 2016-11-22 DIAGNOSIS — N3281 Overactive bladder: Secondary | ICD-10-CM | POA: Diagnosis present

## 2016-11-22 HISTORY — DX: Personal history of other venous thrombosis and embolism: Z86.718

## 2016-11-22 HISTORY — DX: Gout, unspecified: M10.9

## 2016-11-22 HISTORY — DX: Gastric ulcer, unspecified as acute or chronic, without hemorrhage or perforation: K25.9

## 2016-11-22 HISTORY — DX: Dyspnea, unspecified: R06.00

## 2016-11-22 HISTORY — DX: Cardiac arrhythmia, unspecified: I49.9

## 2016-11-22 HISTORY — DX: Other specified symptoms and signs involving the digestive system and abdomen: R19.8

## 2016-11-22 HISTORY — DX: Other specified postprocedural states: Z98.890

## 2016-11-22 HISTORY — DX: Unspecified atrial fibrillation: I48.91

## 2016-11-22 HISTORY — DX: Gastrointestinal hemorrhage, unspecified: K92.2

## 2016-11-22 HISTORY — DX: Presence of cardiac pacemaker: Z95.0

## 2016-11-22 LAB — COMPREHENSIVE METABOLIC PANEL
ALBUMIN: 3.5 g/dL (ref 3.5–5.0)
ALK PHOS: 41 U/L (ref 38–126)
ALT: 36 U/L (ref 14–54)
AST: 73 U/L — ABNORMAL HIGH (ref 15–41)
Anion gap: 10 (ref 5–15)
BILIRUBIN TOTAL: 0.8 mg/dL (ref 0.3–1.2)
BUN: 14 mg/dL (ref 6–20)
CALCIUM: 8.2 mg/dL — AB (ref 8.9–10.3)
CO2: 21 mmol/L — AB (ref 22–32)
CREATININE: 0.82 mg/dL (ref 0.44–1.00)
Chloride: 108 mmol/L (ref 101–111)
GFR calc Af Amer: >60 mL/min (ref >60)
GFR calc non Af Amer: >60 mL/min (ref >60)
GLUCOSE: 193 mg/dL — AB (ref 65–99)
Potassium: 3.4 mmol/L — ABNORMAL LOW (ref 3.5–5.1)
Sodium: 139 mmol/L (ref 135–145)
Total Protein: 6.2 g/dL — ABNORMAL LOW (ref 6.5–8.1)

## 2016-11-22 LAB — CBC
HCT: 36 % (ref 36.0–46.0)
Hemoglobin: 11.4 g/dL — ABNORMAL LOW (ref 12.0–15.0)
MCH: 30.1 pg (ref 26.0–34.0)
MCHC: 31.7 g/dL (ref 30.0–36.0)
MCV: 95 fL (ref 78.0–100.0)
PLATELETS: 352 10*3/uL (ref 150–400)
RBC: 3.79 MIL/uL — ABNORMAL LOW (ref 3.87–5.11)
RDW: 19.2 % — AB (ref 11.5–15.5)
WBC: 14.7 10*3/uL — ABNORMAL HIGH (ref 4.0–10.5)

## 2016-11-22 LAB — MAGNESIUM: Magnesium: 1.3 mg/dL — ABNORMAL LOW (ref 1.7–2.4)

## 2016-11-22 LAB — ABO/RH: ABO/RH(D): A POS

## 2016-11-22 LAB — GLUCOSE, CAPILLARY: Glucose-Capillary: 202 mg/dL — ABNORMAL HIGH (ref 65–99)

## 2016-11-22 LAB — TYPE AND SCREEN
ABO/RH(D): A POS
Antibody Screen: NEGATIVE

## 2016-11-22 LAB — MRSA PCR SCREENING: MRSA by PCR: NEGATIVE

## 2016-11-22 SURGERY — EGD (ESOPHAGOGASTRODUODENOSCOPY)
Anesthesia: Monitor Anesthesia Care

## 2016-11-22 MED ORDER — PIPERACILLIN-TAZOBACTAM 3.375 G IVPB
3.3750 g | Freq: Three times a day (TID) | INTRAVENOUS | Status: AC
  Administered 2016-11-22 – 2016-11-28 (×21): 3.375 g via INTRAVENOUS
  Filled 2016-11-22 (×23): qty 50

## 2016-11-22 MED ORDER — HEPARIN SODIUM (PORCINE) 5000 UNIT/ML IJ SOLN
5000.0000 [IU] | Freq: Three times a day (TID) | INTRAMUSCULAR | Status: DC
  Administered 2016-11-22 – 2016-11-30 (×23): 5000 [IU] via SUBCUTANEOUS
  Filled 2016-11-22 (×26): qty 1

## 2016-11-22 MED ORDER — ONDANSETRON 4 MG PO TBDP
4.0000 mg | ORAL_TABLET | Freq: Four times a day (QID) | ORAL | Status: DC | PRN
  Filled 2016-11-22: qty 1

## 2016-11-22 MED ORDER — SODIUM CHLORIDE 0.9 % IV SOLN
8.0000 mg/h | INTRAVENOUS | Status: DC
  Administered 2016-11-22 – 2016-11-23 (×4): 8 mg/h via INTRAVENOUS
  Filled 2016-11-22 (×7): qty 80

## 2016-11-22 MED ORDER — ACETAMINOPHEN 160 MG/5ML PO SOLN: 325.0000 mg | ORAL | Status: DC | PRN

## 2016-11-22 MED ORDER — ACETAMINOPHEN 325 MG PO TABS: 325.0000 mg | ORAL_TABLET | ORAL | Status: DC | PRN

## 2016-11-22 MED ORDER — MIDAZOLAM HCL 5 MG/5ML IJ SOLN
INTRAMUSCULAR | Status: DC | PRN
  Administered 2016-11-22: 2 mg via INTRAVENOUS

## 2016-11-22 MED ORDER — SUCCINYLCHOLINE CHLORIDE 20 MG/ML IJ SOLN
INTRAMUSCULAR | Status: DC | PRN
  Administered 2016-11-22: 60 mg via INTRAVENOUS

## 2016-11-22 MED ORDER — ROCURONIUM BROMIDE 100 MG/10ML IV SOLN
INTRAVENOUS | Status: DC | PRN
  Administered 2016-11-22: 30 mg via INTRAVENOUS
  Administered 2016-11-22: 20 mg via INTRAVENOUS
  Administered 2016-11-22: 30 mg via INTRAVENOUS

## 2016-11-22 MED ORDER — HYDROMORPHONE 1 MG/ML IV SOLN
INTRAVENOUS | Status: DC
  Administered 2016-11-22: 2.2 mg via INTRAVENOUS
  Administered 2016-11-22: 1.8 mg via INTRAVENOUS
  Administered 2016-11-22: 3 mg via INTRAVENOUS
  Administered 2016-11-22: 0.2 mg via INTRAVENOUS
  Administered 2016-11-22 (×2): 1.4 mg via INTRAVENOUS
  Administered 2016-11-23: 1.2 mg via INTRAVENOUS
  Administered 2016-11-23: 1.6 mg via INTRAVENOUS
  Administered 2016-11-23: 1.4 mg via INTRAVENOUS
  Administered 2016-11-23: 2.2 mg via INTRAVENOUS
  Administered 2016-11-23: 0.2 mg via INTRAVENOUS
  Administered 2016-11-24: 1.6 mg via INTRAVENOUS
  Administered 2016-11-24: 0.6 mg via INTRAVENOUS
  Administered 2016-11-24: 1.4 mg via INTRAVENOUS
  Administered 2016-11-24: 17:00:00 via INTRAVENOUS
  Administered 2016-11-24: 1.4 mg via INTRAVENOUS
  Administered 2016-11-24: 0 mg via INTRAVENOUS
  Administered 2016-11-24: 1.6 mg via INTRAVENOUS
  Administered 2016-11-25: 0.4 mg via INTRAVENOUS
  Administered 2016-11-25 (×2): 1 mg via INTRAVENOUS
  Administered 2016-11-25: 1.2 mg via INTRAVENOUS
  Filled 2016-11-22 (×2): qty 25

## 2016-11-22 MED ORDER — ORAL CARE MOUTH RINSE
15.0000 mL | Freq: Two times a day (BID) | OROMUCOSAL | Status: DC
  Administered 2016-11-22 – 2016-11-30 (×13): 15 mL via OROMUCOSAL

## 2016-11-22 MED ORDER — LIDOCAINE HCL (CARDIAC) 20 MG/ML IV SOLN
INTRAVENOUS | Status: DC | PRN
  Administered 2016-11-22: 60 mg via INTRATRACHEAL

## 2016-11-22 MED ORDER — HYDRALAZINE HCL 20 MG/ML IJ SOLN: 10.0000 mg | INTRAMUSCULAR | Status: DC | PRN

## 2016-11-22 MED ORDER — WHITE PETROLATUM EX OINT
TOPICAL_OINTMENT | CUTANEOUS | Status: AC
  Administered 2016-11-22: 1
  Filled 2016-11-22: qty 28.35

## 2016-11-22 MED ORDER — SODIUM CHLORIDE 0.9% FLUSH: 9.0000 mL | INTRAVENOUS | Status: DC | PRN

## 2016-11-22 MED ORDER — LABETALOL HCL 5 MG/ML IV SOLN
INTRAVENOUS | Status: DC | PRN
  Administered 2016-11-22: 5 mg via INTRAVENOUS
  Administered 2016-11-22: 10 mg
  Administered 2016-11-22: 5 mg via INTRAVENOUS

## 2016-11-22 MED ORDER — METOPROLOL TARTRATE 5 MG/5ML IV SOLN
5.0000 mg | Freq: Four times a day (QID) | INTRAVENOUS | Status: DC | PRN
  Administered 2016-11-23 – 2016-11-26 (×2): 5 mg via INTRAVENOUS
  Filled 2016-11-22 (×2): qty 5

## 2016-11-22 MED ORDER — SUGAMMADEX SODIUM 200 MG/2ML IV SOLN
INTRAVENOUS | Status: DC | PRN
  Administered 2016-11-22: 200 mg via INTRAVENOUS

## 2016-11-22 MED ORDER — SODIUM CHLORIDE 0.9 % IJ SOLN
INTRAMUSCULAR | Status: AC
  Filled 2016-11-22: qty 10

## 2016-11-22 MED ORDER — SODIUM CHLORIDE 0.9 % IV SOLN
INTRAVENOUS | Status: DC
  Administered 2016-11-22 – 2016-11-27 (×9): via INTRAVENOUS

## 2016-11-22 MED ORDER — ONDANSETRON HCL 4 MG/2ML IJ SOLN
INTRAMUSCULAR | Status: DC | PRN
  Administered 2016-11-22: 4 mg via INTRAVENOUS

## 2016-11-22 MED ORDER — OXYCODONE HCL 5 MG/5ML PO SOLN: 5.0000 mg | Freq: Once | ORAL | Status: DC | PRN

## 2016-11-22 MED ORDER — POTASSIUM CHLORIDE 10 MEQ/100ML IV SOLN
10.0000 meq | INTRAVENOUS | Status: AC
  Administered 2016-11-22 (×4): 10 meq via INTRAVENOUS

## 2016-11-22 MED ORDER — ALBUMIN HUMAN 5 % IV SOLN
INTRAVENOUS | Status: DC | PRN
  Administered 2016-11-22 (×2): via INTRAVENOUS

## 2016-11-22 MED ORDER — PHENYLEPHRINE 40 MCG/ML (10ML) SYRINGE FOR IV PUSH (FOR BLOOD PRESSURE SUPPORT)
PREFILLED_SYRINGE | INTRAVENOUS | Status: AC
  Filled 2016-11-22: qty 10

## 2016-11-22 MED ORDER — PROPOFOL 10 MG/ML IV BOLUS
INTRAVENOUS | Status: DC | PRN
  Administered 2016-11-22: 20 mg via INTRAVENOUS
  Administered 2016-11-22: 70 mg via INTRAVENOUS

## 2016-11-22 MED ORDER — FENTANYL CITRATE (PF) 100 MCG/2ML IJ SOLN
INTRAMUSCULAR | Status: AC
  Filled 2016-11-22: qty 2

## 2016-11-22 MED ORDER — LABETALOL HCL 5 MG/ML IV SOLN
INTRAVENOUS | Status: AC
  Filled 2016-11-22: qty 4

## 2016-11-22 MED ORDER — DIPHENHYDRAMINE HCL 50 MG/ML IJ SOLN
12.5000 mg | Freq: Four times a day (QID) | INTRAMUSCULAR | Status: DC | PRN
Start: 2016-11-22 — End: 2016-11-30
  Administered 2016-11-22 – 2016-11-23 (×2): 12.5 mg via INTRAVENOUS
  Filled 2016-11-22 (×3): qty 1

## 2016-11-22 MED ORDER — OXYCODONE HCL 5 MG PO TABS: 5.0000 mg | ORAL_TABLET | Freq: Once | ORAL | Status: DC | PRN

## 2016-11-22 MED ORDER — PANTOPRAZOLE SODIUM 40 MG IV SOLR: 40.0000 mg | Freq: Two times a day (BID) | INTRAVENOUS | Status: DC

## 2016-11-22 MED ORDER — MIRABEGRON ER 25 MG PO TB24
50.0000 mg | ORAL_TABLET | Freq: Every day | ORAL | Status: DC
  Administered 2016-11-23 – 2016-11-30 (×8): 50 mg via ORAL
  Filled 2016-11-22: qty 2
  Filled 2016-11-22: qty 1
  Filled 2016-11-22 (×7): qty 2

## 2016-11-22 MED ORDER — DEXAMETHASONE SODIUM PHOSPHATE 10 MG/ML IJ SOLN
INTRAMUSCULAR | Status: DC | PRN
  Administered 2016-11-22: 10 mg via INTRAVENOUS

## 2016-11-22 MED ORDER — DEXAMETHASONE SODIUM PHOSPHATE 10 MG/ML IJ SOLN
INTRAMUSCULAR | Status: AC
  Filled 2016-11-22: qty 1

## 2016-11-22 MED ORDER — GABAPENTIN 300 MG PO CAPS
300.0000 mg | ORAL_CAPSULE | Freq: Three times a day (TID) | ORAL | Status: DC
  Administered 2016-11-23 – 2016-11-30 (×22): 300 mg via ORAL
  Filled 2016-11-22 (×25): qty 1

## 2016-11-22 MED ORDER — ONDANSETRON HCL 4 MG/2ML IJ SOLN: 4.0000 mg | Freq: Four times a day (QID) | INTRAMUSCULAR | Status: DC | PRN

## 2016-11-22 MED ORDER — 0.9 % SODIUM CHLORIDE (POUR BTL) OPTIME
TOPICAL | Status: DC | PRN
Start: 2016-11-22 — End: 2016-11-22
  Administered 2016-11-22 (×2): 1000 mL

## 2016-11-22 MED ORDER — NALOXONE HCL 0.4 MG/ML IJ SOLN: 0.4000 mg | INTRAMUSCULAR | Status: DC | PRN

## 2016-11-22 MED ORDER — PHENYLEPHRINE HCL 10 MG/ML IJ SOLN
INTRAMUSCULAR | Status: DC | PRN
  Administered 2016-11-22: 120 ug via INTRAVENOUS
  Administered 2016-11-22: 40 ug via INTRAVENOUS
  Administered 2016-11-22: 80 ug via INTRAVENOUS
  Administered 2016-11-22: 120 ug via INTRAVENOUS
  Administered 2016-11-22: 40 ug via INTRAVENOUS
  Administered 2016-11-22: 80 ug via INTRAVENOUS

## 2016-11-22 MED ORDER — SUFENTANIL CITRATE 50 MCG/ML IV SOLN
INTRAVENOUS | Status: DC | PRN
  Administered 2016-11-22: 10 ug via INTRAVENOUS
  Administered 2016-11-22: 5 ug via INTRAVENOUS
  Administered 2016-11-22: 15 ug via INTRAVENOUS
  Administered 2016-11-22 (×2): 5 ug via INTRAVENOUS
  Administered 2016-11-22: 10 ug via INTRAVENOUS

## 2016-11-22 MED ORDER — PANTOPRAZOLE SODIUM 40 MG IV SOLR
80.0000 mg | Freq: Once | INTRAVENOUS | Status: AC
  Administered 2016-11-22: 80 mg via INTRAVENOUS
  Filled 2016-11-22: qty 80

## 2016-11-22 MED ORDER — ONDANSETRON HCL 4 MG/2ML IJ SOLN
INTRAMUSCULAR | Status: AC
  Filled 2016-11-22: qty 2

## 2016-11-22 MED ORDER — ROPINIROLE HCL 0.5 MG PO TABS
0.5000 mg | ORAL_TABLET | Freq: Three times a day (TID) | ORAL | Status: DC
  Administered 2016-11-23 – 2016-11-30 (×22): 0.5 mg via ORAL
  Filled 2016-11-22 (×25): qty 1

## 2016-11-22 MED ORDER — LACTATED RINGERS IV SOLN
INTRAVENOUS | Status: DC | PRN
  Administered 2016-11-22 (×3): via INTRAVENOUS

## 2016-11-22 MED ORDER — FENTANYL CITRATE (PF) 100 MCG/2ML IJ SOLN
25.0000 ug | INTRAMUSCULAR | Status: DC | PRN
  Administered 2016-11-22 (×2): 25 ug via INTRAVENOUS

## 2016-11-22 MED ORDER — SUCCINYLCHOLINE CHLORIDE 200 MG/10ML IV SOSY
PREFILLED_SYRINGE | INTRAVENOUS | Status: AC
  Filled 2016-11-22: qty 10

## 2016-11-22 NOTE — Progress Notes (Signed)
Deanna Spencer PCCM AM Follow up (last seen 0620)    S:  Pt reports mild abd pain > PCA is helping.  Denies SOB, chest pain.  Reports she can't find her glasses.      O: Blood pressure 130/80, pulse (!) 116, temperature 98.3 F (36.8 C), temperature source Oral, resp. rate 15, height 5' (1.524 m), weight 115 lb (52.2 kg), SpO2 95 %.  Recent Labs  Lab 11/17/16 0953 11/21/16 2056 11/22/16 0631  HGB 12.5 14.5 11.4*  HCT 38.6 44.7 36.0  WBC 6.6 12.3* 14.7*  PLT 418.0* 447* 352   Recent Labs  Lab 11/17/16 0953 11/21/16 2056 11/22/16 0631  NA 141 139 139  K 4.6 3.6 3.4*  CL 109 107 108  CO2 23 21* 21*  GLUCOSE 97 117* 193*  BUN 21 19 14   CREATININE 0.75 0.97 0.82  CALCIUM 9.3 9.4 8.2*  MG  --   --  1.3*   General: elderly female in NAD, lying in bed  HEENT: MM pink/moist, no jvd Neuro: AAOx4, speech clear, MAE CV: s1s2 rrr, no m/r/g PULM: even/non-labored, lungs bilaterally clear  GI: midline abd dressing c/d/i, R sided JP drain with bloody drainage   Extremities: warm/dry, no edema  Skin: no rashes or lesions   STUDIES 11/21/16 CT ABDOMEN PELVIS: 1. Pneumoperitoneum and extensive inflammatory changes in the upper abdomen consistent with stomach or proximal small bowel perforation. Status post gastrojejunostomy and suspected extensive revisions. No abscess. 2. Status post cholecystectomy with greater than expected though potentially postprocedural intrahepatic biliary dilatation. 3. An incidental finding of potential clinical significance has been found. 3.5 cm infrarenal aortic aneurysm.  4. Sclerotic lesion LEFT anterior fifth rib.  EVENTS 11/12  Admit with abd pain in setting of perforated viscus 11/13  Stable, transferred out of ICU  ANTIBIOTICS  Zosyn 11/12 >>   CULTURES MRSA PCR 11/13 >>   A: Abdominal Pain secondary to Perforated Viscus s/p Ex-Lap 11/12  AF - previously on coumadin briefly  Infrarenal Aortic Aneurysm - incidental finding, 3.5 cm   HTN Anemia - hx of, mild decrease post surgery Hypokalemia  P: Post-op recommendations per CCS  PCA pain control  Pending transfer out of ICU per primary SVC  Continue abx, D2/x Early PT efforts / mobilization  Incentive spirometry  Monitor respiratory status with PCA  Tele monitoring  No anticoagulation due to risk of bleeding  Hold ASA SCD's for DVT prophylaxis  PRN lopressor for SBP > 160  Protonix gtt per primary  Replace K, 40 mEq IV Follow up labs for am   PCCM will sign off.  Please call back if new needs arise.    Noe Gens, NP-C [facility] Pgr: [phone] or if no answer [phone] 11/22/2016, 9:05 AM

## 2016-11-22 NOTE — Op Note (Signed)
Operative Note  Deanna Spencer  [id number]  [id number]  11/22/2016   Surgeon: Victorino Sparrow ConnorMD  Assistant: Alphonsa Overall, MD  Procedure performed: 1. Exploratory laparotomy 2. Revision of gastrojejunostomy 3. Esophagogastroscopy  Preop diagnosis: perforated viscus Post-op diagnosis/intraop findings: large dehiscence of the anterior aspect of her gastrojejunostomy  Specimens: gastrojejunostomy Retained items: 19 French round Blake drain EBL: [id number] Complications: none  Indications: this is a 76 year old woman with a history of laparoscopic gastric bypass 15 years ago complicated by recalcitrant ulcers and upper GI bleeding necessitating 2 prior laparoscopic revisions of her gastrojejunostomy the most recent of which was 1 year ago. She has an antecolic antegastric anatomy. She presented with 2 days of worsening upper abdominal pain and was found on CT scan to have pneumoperitoneum.  Description of procedure: After obtaining informed consent the patient was taken to the operating room and placed supine on operating room table wheregeneral endotracheal anesthesia was initiated, preoperative antibiotics were administered, SCDs applied, and a formal timeout was performed. The abdomen was prepped and draped in usual sterile fashion. An upper midline laparotomy was created and cautery was used to divide the linea alba. The peritoneal cavity was entered bluntly and fortunately there were not any adhesions to the anterior abdominal wall. The liver was densely adherent with thickened cicatrix to the gastric pouch and tedious dissection was undertaken to free it off and exposed the gastric pouch. In this process, the perforation was identified it was a large (about 2 cm) defect at the anterior aspect of the gastrojejunostomy. There was purulent fluid in the upper abdomen which was aspirated. She did not have sufficient omentum to perform a patch and furthermore the perforation was quite large. We  continued with very tedious dissection to expose the entire gastric pouch which was unusually large, about 8 cm in length redundant posterior component, and the gastrojejunostomy using combination of blunt dissection and electrocautery. We elected to resect the gastrojejunostomy. This was accomplished with serial fires of the 60 mm Ethicon stapler. A 59 French visiG was introduced and maintained in the pouch while a green load was used to close the gastric perforation transversely, thus ensuring adequate residual lumen, and a blue load was used to divide the Roux limb just distal to the anastomosis. The LigaSure was used to divide the small bowel mesentery. The new proximal end of the Roux limb was tacked to the proximal posterior stomach with a 3-0 silk seromuscular suture. Using the visiG has a brace, a gastrotomy was made at the posterior aspect of the pouch as well as on the antimesenteric border of the Roux limb. A blue load linear cutting stapler was then used to create a gastrojejunostomy. The common enterotomy was closed with a running 3-0 Vicryl and the suture line was imbricated with seromuscular interrupted 3-0 silk sutures. At this point Dr. Lucia Gaskins performed an upper endoscopy while the anastomosis was submerged in warm sterile saline and the Roux limb clamped. He noted the GE junction at about 38cm and the anastomosis, which was patent and hemostatic and viable appearing, at about 43 cm; please see his separate note. No bubbles were observed and no evidence of leak was present on upper endoscopy. The irrigant was aspirated and the abdomen was irrigated with another liter of warm sterile saline and the effluent was clear. A 19 French round Blake drain was introduced through the right abdominal wall and directed to sit atop the anterior gastric staple line and the new gastrojejunostomy. This was secured the  skin with a 2-0 nylon. The abdominal fascia was then reapproximated with a running single strand #1  PDS. The skin was loosely closed with staples and a dry gauze dressing applied. The patient was then awakened, extubated and taken to PACU in stable condition.   All counts were correct at the completion of the case.

## 2016-11-22 NOTE — Consult Note (Signed)
The patient has been seen in conjunction with Harlan Stains, NP-C. All aspects of care have been considered and discussed. The patient has been personally interviewed, examined, and all clinical data has been reviewed.   Complicated situation in this 76 year old female with CHADS VASC score of at least 3 placing her at increased risk for embolic events due to chronic atrial fibrillation.  She has been treated with a Watchman left atrial appendage occlusion device.  She has had recurrent GI bleeding related to the ulcers following gastric bypass surgery.  Admitted on this occasion with abdominal pain, found to have a small bowel perforation and has undergone exploratory laparotomy for definitive management.  Anticoagulation is not feasible currently nor safe over the long haul given recurrent bleeding.  Antiplatelet therapy is also being held and certainly nonsteroidal agents would aggravate her tendency towards ulceration and bleeding.  When she is further out from this episode, Plavix may be a consideration since it has no primary e/erosive effect on the intestinal lining.  There are no current acute cardiac issues.  We will follow and have her enrolled in our cardiology clinic for follow-up.   Cardiology Consult    Patient ID: Deanna Spencer MRN: [id number], DOB/AGE: 01/04/1941   Admit date: 11/21/2016 Date of Consult: 11/22/2016  Primary Physician: Nuala Alpha, DO Primary Cardiologist: New (Dr. Conley Canal, Janalee Dane) Requesting Provider: Marily Memos Reason for Consultation: Afib  Deanna Spencer is a 76 y.o. female who is being seen today for the evaluation of Afib at the request of Dr. Marily Memos.   Patient Profile    76 year old female with past medical history of hypertension, GERD, anemia, RA, chronic atrial fibrillation s/p watchman device, GI bleed with previous gastric bypass surgery who presented with abdominal pain.  Past Medical History   Past Medical History:  Diagnosis  Date  . Anemia   . Arthritis    RA  . Diverticulosis   . Duodenal ulcer   . Dyspnea   . Dysrhythmia    a fib  . GERD (gastroesophageal reflux disease)   . GI bleed   . Gout   . Heart attack ([place])   . Hx of blood clots    in heart  . IBS (irritable bowel syndrome)   . Neuromuscular disorder (HCC)    neuropathy  . Presence of permanent cardiac pacemaker    medtronic implanted october 2017   Serial # [id number]  . Stomach ulcer     Past Surgical History:  Procedure Laterality Date  . ABDOMINAL HYSTERECTOMY    . APPENDECTOMY    . BARIATRIC SURGERY    . CHOLECYSTECTOMY    . fusion r foot    . HERNIA REPAIR    . INSERT / REPLACE / REMOVE PACEMAKER     med tronic  . JOINT REPLACEMENT     bil TKA  . SHOULDER SURGERY     reverse shoulder replacement  . SPINAL FUSION     x2 lumbar  . watchman device      placed  via groin     Allergies  Allergies  Allergen Reactions  . Demerol [Meperidine] Anaphylaxis    History of Present Illness    Deanna Spencer is a 76 year old female whose cardiac history includes chronic atrial fibrillation.  She moved to this area recently, was previously living in [place].  States that approximately a year ago she did multiple episodes of bleeding, and found to have stomach ulcers.  Around the same time she  developed atrial fibrillation, and was subsequently placed on Coumadin.  Reports over the past year she has had many issues with recurrent abdominal surgeries, and ongoing GI bleeding.  Reports having multiple transfusions with up to 47 units of PRBCs and 12 units of platelets.  States she did have a cardiac cath during this time, but reports there was no PCI or stenting done.  During this past year she also had to have a Medtronic pacemaker placed due to bradycardia.  Per her report she underwent a watchman device placement around July of this year, and Coumadin was ultimately stopped and she was continued on 81 mg aspirin.  She is now  currently living in the area with her daughter.  States that she developed upper abdominal pain about 2 days prior to presenting to the ED.  CT abdomen was consistent with a small bowel perforation.  She underwent exploratory laparotomy with revision of GJ and EGD and tolerated procedure well.  On admission her EKG showed atrial fibrillation with rates in the low 100s.  Admission labs are stable. She denies any significant anginal symptoms prior to admission.  Inpatient Medications    . fentaNYL      . gabapentin  300 mg Oral TID  . heparin  5,000 Units Subcutaneous Q8H  . HYDROmorphone   Intravenous Q4H  . mouth rinse  15 mL Mouth Rinse BID  . mirabegron ER  50 mg Oral Daily  . [START ON 11/25/2016] pantoprazole  40 mg Intravenous Q12H  . rOPINIRole  0.5 mg Oral TID    Family History    Family History  Problem Relation Age of Onset  . Heart disease Mother   . Liver disease Father   . Ulcers Father   . Prostate cancer Brother   . Colon cancer Neg Hx     Social History    Social History   Socioeconomic History  . Marital status: Widowed    Spouse name: Not on file  . Number of children: Not on file  . Years of education: Not on file  . Highest education level: Not on file  Social Needs  . Financial resource strain: Not on file  . Food insecurity - worry: Not on file  . Food insecurity - inability: Not on file  . Transportation needs - medical: Not on file  . Transportation needs - non-medical: Not on file  Occupational History  . Not on file  Tobacco Use  . Smoking status: Never Smoker  . Smokeless tobacco: Never Used  Substance and Sexual Activity  . Alcohol use: No    Frequency: Never  . Drug use: No  . Sexual activity: Not Currently  Other Topics Concern  . Not on file  Social History Narrative  . Not on file     Review of Systems    See HPI All other systems reviewed and are otherwise negative except as noted above.  Physical Exam    Blood pressure  125/89, pulse (!) 111, temperature 98.1 F (36.7 C), temperature source Oral, resp. rate 15, height 5' (1.524 m), weight 115 lb (52.2 kg), SpO2 97 %.  General: Pleasant, older white female  NAD Psych: Normal affect. Neuro: Alert and oriented X 3. Moves all extremities spontaneously. HEENT: Normal  Neck: Supple without bruits or JVD. Lungs:  Resp regular and unlabored, CTA. Heart: Irregularly irregular no s3, s4, + systolic murmur. Abdomen: Soft, clean dry dressing in place, with JP drain. Extremities: No clubbing, cyanosis or edema. DP/PT/Radials  2+ and equal bilaterally.  Labs    Troponin [facility] Test) Recent Labs    11/21/16 2108  TROPIPOC 0.00   No results for input(s): CKTOTAL, CKMB, TROPONINI in the last 72 hours. Lab Results  Component Value Date   WBC 14.7 (H) 11/22/2016   HGB 11.4 (L) 11/22/2016   HCT 36.0 11/22/2016   MCV 95.0 11/22/2016   PLT 352 11/22/2016    Recent Labs  Lab 11/22/16 0631  NA 139  K 3.4*  CL 108  CO2 21*  BUN 14  CREATININE 0.82  CALCIUM 8.2*  PROT 6.2*  BILITOT 0.8  ALKPHOS 41  ALT 36  AST 73*  GLUCOSE 193*   No results found for: CHOL, HDL, LDLCALC, TRIG No results found for: [facility]   Radiology Studies    Dg Chest 2 View  Result Date: 11/21/2016 CLINICAL DATA:  76 year old female with history of atrial fibrillation. EXAM: CHEST  2 VIEW COMPARISON:  No priors. FINDINGS: Lung volumes are low. Mild elevation of the right hemidiaphragm. No acute consolidative airspace disease. No pleural effusions. No evidence of pulmonary edema. Heart size is mildly enlarged with left atrial dilatation. The patient is rotated to the right on today's exam, resulting in distortion of the mediastinal contours and reduced diagnostic sensitivity and specificity for mediastinal pathology. Aortic atherosclerosis. Left-sided pacemaker device in place with lead tips projecting over the expected location of the right atrial appendage and near the right  ventricular apex. Status post bilateral shoulder arthroplasty. IMPRESSION: 1. Lung volumes with no radiographic evidence of acute cardiopulmonary disease. 2. Cardiomegaly with left atrial dilatation. 3. Aortic atherosclerosis. Electronically Signed   By: Vinnie Langton M.D.   On: 11/21/2016 21:47   Ct Abdomen Pelvis W Contrast  Result Date: 11/21/2016 CLINICAL DATA:  Abdominal pain for 2 days, atrial fibrillation. History of duodenum ulcer, diverticulosis, gastric bypass, cholecystectomy, appendectomy. EXAM: CT ABDOMEN AND PELVIS WITH CONTRAST TECHNIQUE: Multidetector CT imaging of the abdomen and pelvis was performed using the standard protocol following bolus administration of intravenous contrast. CONTRAST:  125mL ISOVUE-300 IOPAMIDOL (ISOVUE-300) INJECTION 61% COMPARISON:  None. FINDINGS: LOWER CHEST: RIGHT lung base dependent atelectasis. Included heart size is moderately enlarged. No parapharyngeal effusion. Pacemaker in place. HEPATOBILIARY: Status post cholecystectomy. Moderate biliary dilatation may be post procedural. PANCREAS: Atrophic, nonacute. SPLEEN: Normal. ADRENALS/URINARY TRACT: Kidneys are orthotopic, demonstrating symmetric enhancement. Severe scarring bilateral kidneys with focal atrophy LEFT interpolar kidney. 15 mm RIGHT interpolar cyst. No nephrolithiasis, hydronephrosis or solid renal masses. The unopacified ureters are normal in course and caliber. Delayed imaging through the kidneys demonstrates symmetric prompt contrast excretion within the proximal urinary collecting system. Urinary bladder is partially distended and unremarkable. Normal adrenal glands. STOMACH/BOWEL: Assessment limited without oral contrast. Moderate hiatal hernia. Status post apparent cast dura jejunostomy. Focal 2.8 cm cyst gastric anastomosis. Extensive gastric luminal hyper re- media and inflammatory changes in the upper abdomen. Focal fluid and inflammation at proximal duodenum. Ligament of Treitz appears in  RIGHT abdomen. Severe colonic diverticulosis. VASCULAR/LYMPHATIC: 3.5 cm infrarenal aortic aneurysm. SMA and IMA not identified though not tailored for evaluation. No lymphadenopathy by CT size criteria. REPRODUCTIVE: Status post hysterectomy. OTHER: Moderate intraperitoneal free air predominately in the upper abdomen. Small amount of ascites. No drainable fluid collection. Small fat containing LEFT inguinal hernia. Small fat containing umbilical hernia. MUSCULOSKELETAL: Nonacute. Old LEFT superior and inferior pubic rami fractures. Sub chronic deformities of bilateral iliac bone, possible donor site. Status post L3-4 PLIF. Broad levoscoliosis. Severe RIGHT, moderate  LEFT hip osteoarthrosis. Asymmetric RIGHT pelvic muscle atrophy. RIGHT hip effusion. Old RIGHT rib fractures. Subcentimeter sclerotic focus LEFT fifth rib. IMPRESSION: 1. Pneumoperitoneum and extensive inflammatory changes in the upper abdomen consistent with stomach or proximal small bowel perforation. Status post gastrojejunostomy and suspected extensive revisions. No abscess. 2. Status post cholecystectomy with greater than expected though potentially postprocedural intrahepatic biliary dilatation. 3. **An incidental finding of potential clinical significance has been found. 3.5 cm infrarenal aortic aneurysm. Recommend followup by ultrasound in 2 years. This recommendation follows ACR consensus guidelines: White Paper of the ACR Incidental Findings Committee II on Vascular Findings. J Am Coll Radiol 2013; 10:789-794.** 4. Sclerotic lesion LEFT anterior fifth rib. If there is a history of cancer, consider bone scan. 5. Critical Value/emergent results were called by telephone at the time of interpretation on 11/21/2016 at 11:00 pm to Dr. Virgel Manifold , who verbally acknowledged these results. Electronically Signed   By: Elon Alas M.D.   On: 11/21/2016 23:04    ECG & Cardiac Imaging    EKG: Atrial fibrillation, right bundle branch block,  inferior infarct (old).  No old tracings available for comparison.  Assessment & Plan    76 year old female with past medical history of hypertension, GERD, anemia, RA, chronic atrial fibrillation s/p watchman device, GI bleed with previous gastric bypass surgery who presented with abdominal pain.  1. Chronic Afib/PPM s/p Watchman: Developed 9/17 and was not able to tolerate [place] due to significant bleeding issues and required many blood transfusions while living back in [place]. Had a watchman device back in July of this year and placed only on 81mg  ASA afterwards. Remains in Afib currently, rates overall controlled. Given her hx and now admission with recurrent GI surgery, suspect that ASA should now be stopped as well. Will request [facility].   2. Abd with bowel perforation s/p exp lap and GJ repair: Plan per surgery. Overall seems to be doing well at the time of interview. JP drain in place  3. Anemia, with Hx of GI bleed: stable post op  4. GERD: on protonix drip  Signed, Reino Bellis, NP-C Pager [phone] 11/22/2016, 12:30 PM

## 2016-11-22 NOTE — Consult Note (Addendum)
Medical Consultation   Deanna Spencer  GNF:[id number]  DOB: 06/29/1940  DOA: 11/21/2016  PCP: Nuala Alpha, DO    Requesting physician: [facility] Dr. Georgette Dover Reason for consultation: To help in the management of chronic medical issues     History of Present Illness:Deanna Spencer is an 76 y.o. female with a history of hypertension, GERD, ANemia, RA  and history of atrial fibrillation initially on on chronic Coumadin, which was discontinued due to GIB and with subsequent placement of left atrial appendage watchman device, PMP , and with complex abdominal surgical history, including Roux and Y gastric bypass about 15 years ago complicated with recalcitrant marginal ulcers, multiple episodes of bleeding, last flare of ulcers in September of this year, presenting with 2 days of upper abdominal pain , and a CT abdomen was consistent with proximal small bowel perforation. She underwent   Exploratory laparotomy with revision of GJ and EGD, tolerating the procedure well. Denies fevers, chills, night sweats, vision changes, or mucositis. Denies any respiratory complaints. Denies any chest pain or palpitations. Denies lower extremity swelling. Denies nausea, heartburn or change in bowel habits.   Appetite is normal. Denies any dysuria. Denies abnormal skin rashes [facility] was requested to help in the management of her chronic medical issues   Review of Systems:  As per HPI otherwise all other systems reviewed and are negative    Past Medical History: Past Medical History:  Diagnosis Date  . Anemia   . Arthritis    RA  . Diverticulosis   . Duodenal ulcer   . Dyspnea   . Dysrhythmia    a fib  . GERD (gastroesophageal reflux disease)   . GI bleed   . Gout   . Heart attack ([place])   . Hx of blood clots    in heart  . IBS (irritable bowel syndrome)   . Neuromuscular disorder (HCC)    neuropathy  . Presence of permanent cardiac pacemaker    medtronic implanted october 2017    Serial # [id number]  . Stomach ulcer     Past Surgical History: Past Surgical History:  Procedure Laterality Date  . ABDOMINAL HYSTERECTOMY    . APPENDECTOMY    . BARIATRIC SURGERY    . CHOLECYSTECTOMY    . fusion r foot    . HERNIA REPAIR    . INSERT / REPLACE / REMOVE PACEMAKER     med tronic  . JOINT REPLACEMENT     bil TKA  . SHOULDER SURGERY     reverse shoulder replacement  . SPINAL FUSION     x2 lumbar  . watchman device      placed  via groin     Allergies:   Allergies  Allergen Reactions  . Demerol [Meperidine] Anaphylaxis     Social History: Social History   Socioeconomic History  . Marital status: Widowed    Spouse name: Not on file  . Number of children: Not on file  . Years of education: Not on file  . Highest education level: Not on file  Social Needs  . Financial resource strain: Not on file  . Food insecurity - worry: Not on file  . Food insecurity - inability: Not on file  . Transportation needs - medical: Not on file  . Transportation needs - non-medical: Not on file  Occupational History  . Not on file  Tobacco Use  . Smoking  status: Never Smoker  . Smokeless tobacco: Never Used  Substance and Sexual Activity  . Alcohol use: No    Frequency: Never  . Drug use: No  . Sexual activity: Not Currently  Other Topics Concern  . Not on file  Social History Narrative  . Not on file       Family History: Family History  Problem Relation Age of Onset  . Heart disease Mother   . Liver disease Father   . Ulcers Father   . Prostate cancer Brother   . Colon cancer Neg Hx     Family history reviewed and not pertinent    Physical Exam: Vitals:   11/22/16 0700 11/22/16 0742 11/22/16 0833 11/22/16 0900  BP: 130/80   117/74  Pulse: (!) 116   (!) 118  Resp: 14 15  (!) 21  Temp:   98.3 F (36.8 C)   TempSrc:   Oral   SpO2: 95% 95%  96%  Weight:      Height:        Constitutional: Appears calm,  alert and awake, oriented x3, not  in any acute distress. Eyes: PERLA, EOMI, irises appear normal, anicteric sclera clear  ENMT: external ears and nose appear normal, normal hearing or hard of hearing.Lips appears normal, oropharynx mucosa, tongue normal  Neck: neck appears normal, no masses, normal ROM, no thyromegaly, no JVD  CVS: S1-S2 clear, no murmur rubs or gallops, no LE edema, normal pedal pulses . L PMP  Respiratory: clear to auscultation bilaterally, no wheezing, rales or rhonchi. Respiratory effort normal. No accessory muscle use.  Abdomen:  Mid abdominal dressing, R JP drain with bloody drainage soft nontender, nondistended, normal bowel sounds Musculoskeletal: no cyanosis, clubbing or edema noted bilaterally.  Joint/bones/muscle exam, strength, contractures or atrophy Neuro: Cranial nerves II-XII intact, strength, sensation, reflexes Psych: judgement and insight appear normal, stable mood and affect, mental status normal  Skin: no rashes or lesions or ulcers, no induration or nodules   Data reviewed:  I have personally reviewed following labs and imaging studies Labs:  CBC: Recent Labs  Lab 11/17/16 0953 11/21/16 2056 11/22/16 0631  WBC 6.6 12.3* 14.7*  HGB 12.5 14.5 11.4*  HCT 38.6 44.7 36.0  MCV 94.3 93.9 95.0  PLT 418.0* 447* 128    Basic Metabolic Panel: Recent Labs  Lab 11/17/16 0953 11/21/16 2056 11/22/16 0631  NA 141 139 139  K 4.6 3.6 3.4*  CL 109 107 108  CO2 23 21* 21*  GLUCOSE 97 117* 193*  BUN 21 19 14   CREATININE 0.75 0.97 0.82  CALCIUM 9.3 9.4 8.2*  MG  --   --  1.3*   GFR Estimated Creatinine Clearance: 43.2 mL/min (by C-G formula based on SCr of 0.82 mg/dL). Liver Function Tests: Recent Labs  Lab 11/22/16 0631  AST 73*  ALT 36  ALKPHOS 41  BILITOT 0.8  PROT 6.2*  ALBUMIN 3.5   Recent Labs  Lab 11/21/16 2056  LIPASE 24   No results for input(s): AMMONIA in the last 168 hours. Coagulation profile Recent Labs  Lab 11/21/16 2056  INR 0.88    Cardiac  Enzymes: No results for input(s): CKTOTAL, CKMB, CKMBINDEX, TROPONINI in the last 168 hours. BNP: Invalid input(s): POCBNP CBG: Recent Labs  Lab 11/22/16 0613  GLUCAP 202*   D-Dimer No results for input(s): DDIMER in the last 72 hours. Hgb A1c No results for input(s): HGBA1C in the last 72 hours. Lipid Profile No results for input(s): CHOL,  HDL, LDLCALC, TRIG, CHOLHDL, LDLDIRECT in the last 72 hours. Thyroid function studies No results for input(s): TSH, T4TOTAL, T3FREE, THYROIDAB in the last 72 hours.  Invalid input(s): FREET3 Anemia work up No results for input(s): VITAMINB12, FOLATE, FERRITIN, TIBC, IRON, RETICCTPCT in the last 72 hours. Urinalysis    Component Value Date/Time   COLORURINE YELLOW 11/21/2016 2320   APPEARANCEUR CLEAR 11/21/2016 2320   LABSPEC >1.046 (H) 11/21/2016 2320   PHURINE 5.0 11/21/2016 2320   GLUCOSEU NEGATIVE 11/21/2016 2320   HGBUR NEGATIVE 11/21/2016 2320   BILIRUBINUR NEGATIVE 11/21/2016 2320   KETONESUR 20 (A) 11/21/2016 2320   PROTEINUR NEGATIVE 11/21/2016 2320   NITRITE NEGATIVE 11/21/2016 2320   LEUKOCYTESUR SMALL (A) 11/21/2016 2320     Sepsis Labs Invalid input(s): PROCALCITONIN,  WBC,  LACTICIDVEN Microbiology Recent Results (from the past 240 hour(s))  MRSA PCR Screening     Status: None   Collection Time: 11/22/16  5:02 AM  Result Value Ref Range Status   MRSA by PCR NEGATIVE NEGATIVE Final    Comment:        The GeneXpert MRSA Assay (FDA approved for NASAL specimens only), is one component of a comprehensive MRSA colonization surveillance program. It is not intended to diagnose MRSA infection nor to guide or monitor treatment for MRSA infections.        Inpatient Medications:   Scheduled Meds: . fentaNYL      . heparin  5,000 Units Subcutaneous Q8H  . HYDROmorphone   Intravenous Q4H  . mouth rinse  15 mL Mouth Rinse BID  . [START ON 11/25/2016] pantoprazole  40 mg Intravenous Q12H   Continuous  Infusions: . sodium chloride 125 mL/hr at 11/22/16 0800  . pantoprozole (PROTONIX) infusion 8 mg/hr (11/22/16 0800)  . piperacillin-tazobactam (ZOSYN)  IV 3.375 g (11/22/16 1700)  . potassium chloride 10 mEq (11/22/16 1007)     Radiological Exams on Admission: Dg Chest 2 View  Result Date: 11/21/2016 CLINICAL DATA:  76 year old female with history of atrial fibrillation. EXAM: CHEST  2 VIEW COMPARISON:  No priors. FINDINGS: Lung volumes are low. Mild elevation of the right hemidiaphragm. No acute consolidative airspace disease. No pleural effusions. No evidence of pulmonary edema. Heart size is mildly enlarged with left atrial dilatation. The patient is rotated to the right on today's exam, resulting in distortion of the mediastinal contours and reduced diagnostic sensitivity and specificity for mediastinal pathology. Aortic atherosclerosis. Left-sided pacemaker device in place with lead tips projecting over the expected location of the right atrial appendage and near the right ventricular apex. Status post bilateral shoulder arthroplasty. IMPRESSION: 1. Lung volumes with no radiographic evidence of acute cardiopulmonary disease. 2. Cardiomegaly with left atrial dilatation. 3. Aortic atherosclerosis. Electronically Signed   By: Vinnie Langton M.D.   On: 11/21/2016 21:47   Ct Abdomen Pelvis W Contrast  Result Date: 11/21/2016 CLINICAL DATA:  Abdominal pain for 2 days, atrial fibrillation. History of duodenum ulcer, diverticulosis, gastric bypass, cholecystectomy, appendectomy. EXAM: CT ABDOMEN AND PELVIS WITH CONTRAST TECHNIQUE: Multidetector CT imaging of the abdomen and pelvis was performed using the standard protocol following bolus administration of intravenous contrast. CONTRAST:  140mL ISOVUE-300 IOPAMIDOL (ISOVUE-300) INJECTION 61% COMPARISON:  None. FINDINGS: LOWER CHEST: RIGHT lung base dependent atelectasis. Included heart size is moderately enlarged. No parapharyngeal effusion. Pacemaker  in place. HEPATOBILIARY: Status post cholecystectomy. Moderate biliary dilatation may be post procedural. PANCREAS: Atrophic, nonacute. SPLEEN: Normal. ADRENALS/URINARY TRACT: Kidneys are orthotopic, demonstrating symmetric enhancement. Severe scarring bilateral kidneys with focal  atrophy LEFT interpolar kidney. 15 mm RIGHT interpolar cyst. No nephrolithiasis, hydronephrosis or solid renal masses. The unopacified ureters are normal in course and caliber. Delayed imaging through the kidneys demonstrates symmetric prompt contrast excretion within the proximal urinary collecting system. Urinary bladder is partially distended and unremarkable. Normal adrenal glands. STOMACH/BOWEL: Assessment limited without oral contrast. Moderate hiatal hernia. Status post apparent cast dura jejunostomy. Focal 2.8 cm cyst gastric anastomosis. Extensive gastric luminal hyper re- media and inflammatory changes in the upper abdomen. Focal fluid and inflammation at proximal duodenum. 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 Severe colonic diverticulosis. VASCULAR/LYMPHATIC: 3.5 cm infrarenal aortic aneurysm. SMA and IMA not identified though not tailored for evaluation. No lymphadenopathy by CT size criteria. REPRODUCTIVE: Status post hysterectomy. OTHER: Moderate intraperitoneal free air predominately in the upper abdomen. Small amount of ascites. No drainable fluid collection. Small fat containing LEFT inguinal hernia. Small fat containing umbilical hernia. MUSCULOSKELETAL: Nonacute. Old LEFT superior and inferior pubic rami fractures. Sub chronic deformities of bilateral iliac bone, possible donor site. Status post L3-4 PLIF. Broad levoscoliosis. Severe RIGHT, moderate LEFT hip osteoarthrosis. Asymmetric RIGHT pelvic muscle atrophy. RIGHT hip effusion. Old RIGHT rib fractures. Subcentimeter sclerotic focus LEFT fifth rib. IMPRESSION: 1. Pneumoperitoneum and extensive inflammatory changes in the upper abdomen consistent with stomach  or proximal small bowel perforation. Status post gastrojejunostomy and suspected extensive revisions. No abscess. 2. Status post cholecystectomy with greater than expected though potentially postprocedural intrahepatic biliary dilatation. 3. **An incidental finding of potential clinical significance has been found. 3.5 cm infrarenal aortic aneurysm. Recommend followup by ultrasound in 2 years. This recommendation follows ACR consensus guidelines: White Paper of the ACR Incidental Findings Committee II on Vascular Findings. J Am Coll Radiol 2013; 10:789-794.** 4. Sclerotic lesion LEFT anterior fifth rib. If there is a history of cancer, consider bone scan. 5. Critical Value/emergent results were called by telephone at the time of interpretation on 11/21/2016 at 11:00 pm to Dr. Virgel Manifold , who verbally acknowledged these results. Electronically Signed   By: Elon Alas M.D.   On: 11/21/2016 23:04    Impression/Recommendations Active Problems:   S/P exploratory laparotomy   Perforated viscus   Spastic bladder   Primary osteoarthritis of right hip   History of heart attack   Atrial fibrillation (HCC)   Peripheral neuropathy   GERD (gastroesophageal reflux disease)   Atrial Fibrillation  status post left atrial appendage watchman device. / History of CAD s/p MI/ history of PMP She was on ASA till presentation, now on hold  EKG Afib no acute changes.  Hold aspirin,  Recommend cardiology consultation, for management of her atrial fibrillation and other cardiac issues, as well as studies such as ECHO . Patient has relocated from [place] to [place], verbalized interest in establishing care here    Peripheral neuropathy Continue Neurontin   Overactive bladder Continue Myrbetriq   History of GERD, the patient is currently on Protonix drip postoperatively, eventually to switch to Protonix IV twice daily Follow up with GI upon discharge   Perforated gastrojejunostomy, status post  exploratory laparotomy with revision of GJ on 11/22/2016,,  the patient tolerated the procedure well.   She will continue n.p.o. until bowel function.  She is on IV Protonix.  Further plans as per general surgery.  Restless legs Continue Requip  Thank you for this consultation.  Our [facility] hospitalist team will be available for further consultative services as needed     Sharene Butters PA-C [facility] 11/22/2016, 10:20 AM  Attending MD note  Patient was seen, examined,treatment plan was discussed with the  Advance Practice Provider.  I have personally reviewed the clinical findings, lab,EKG, imaging studies and management of this patient in detail.I have also reviewed the orders written for this patient which were under my direction. I agree with the documentation, as recorded by the Advance Practice Provider.   Deanna Spencer is a 76 y.o. female who presents with proximal small bowel perforation w/ h/o complicated GI condition. Pt is s/p Exlap and ICU admission. No changes are needed in pts medications for their stable chronic medical conditions. Strongly recommend Cards consultation for cardiac conditions.     Waldemar Dickens, MD Family Medicine See Amion for pager # [facility]

## 2016-11-22 NOTE — Anesthesia Postprocedure Evaluation (Signed)
Anesthesia Post Note  Patient: Deanna Spencer  Procedure(s) Performed: EXPLORATORY LAPAROTOMY, REVISION OF  GASTROJEJUNOSTOMY, ESOPHAGOGASTROSCOPY (N/A Abdomen)     Patient location during evaluation: PACU Anesthesia Type: General Level of consciousness: awake and alert Pain management: pain level controlled Vital Signs Assessment: post-procedure vital signs reviewed and stable Respiratory status: spontaneous breathing, nonlabored ventilation, respiratory function stable and patient connected to nasal cannula oxygen Cardiovascular status: blood pressure returned to baseline and stable Postop Assessment: no apparent nausea or vomiting Anesthetic complications: no    Last Vitals:  Vitals:   11/22/16 0700 11/22/16 0742  BP: 130/80   Pulse: (!) 116   Resp: 14 15  Temp:    SpO2: 95% 95%    Last Pain:  Vitals:   11/22/16 0742  TempSrc:   PainSc: 5                  Emrah Ariola

## 2016-11-22 NOTE — Progress Notes (Signed)
Pt's BP=160/114, Dr. Ermalene Postin was notified and 10mg  labetalol IV was given. After Labetalol was given pt's BP=142/82.

## 2016-11-22 NOTE — Progress Notes (Addendum)
1 Day Post-Op   Subjective/Chief Complaint: Patient awake and alert in the ICU Using PCA with good results No nausea   Objective: Vital signs in last 24 hours: Temp:  [97.3 F (36.3 C)-98.3 F (36.8 C)] 98.3 F (36.8 C) (11/13 0833) Pulse Rate:  [97-117] 116 (11/13 0700) Resp:  [12-23] 15 (11/13 0742) BP: (101-167)/(66-138) 130/80 (11/13 0700) SpO2:  [95 %-100 %] 95 % (11/13 0742) Weight:  [52.2 kg (115 lb)] 52.2 kg (115 lb) (11/12 2056) Last BM Date: (PTA)  Intake/Output from previous day: 11/12 0701 - 11/13 0700 In: 2650.2 [I.V.:2000.2; IV Piggyback:650] Out: 515 [Urine:390; Drains:25; Blood:100] Intake/Output this shift: Total I/O In: 300 [I.V.:300] Out: 30 [Drains:30]  General appearance: alert, cooperative and no distress Resp: clear to auscultation bilaterally Cardio: regular rate and rhythm, S1, S2 normal, no murmur, click, rub or gallop GI: tender; R JP with bloody drainage Incision - dressing dry  Lab Results:  Recent Labs    11/21/16 2056 11/22/16 0631  WBC 12.3* 14.7*  HGB 14.5 11.4*  HCT 44.7 36.0  PLT 447* 352   BMET Recent Labs    11/21/16 2056 11/22/16 0631  NA 139 139  K 3.6 3.4*  CL 107 108  CO2 21* 21*  GLUCOSE 117* 193*  BUN 19 14  CREATININE 0.97 0.82  CALCIUM 9.4 8.2*   PT/INR Recent Labs    11/21/16 2056  LABPROT 11.8  INR 0.88   ABG No results for input(s): PHART, HCO3 in the last 72 hours.  Invalid input(s): PCO2, PO2  Studies/Results: Dg Chest 2 View  Result Date: 11/21/2016 CLINICAL DATA:  76 year old female with history of atrial fibrillation. EXAM: CHEST  2 VIEW COMPARISON:  No priors. FINDINGS: Lung volumes are low. Mild elevation of the right hemidiaphragm. No acute consolidative airspace disease. No pleural effusions. No evidence of pulmonary edema. Heart size is mildly enlarged with left atrial dilatation. The patient is rotated to the right on today's exam, resulting in distortion of the mediastinal contours  and reduced diagnostic sensitivity and specificity for mediastinal pathology. Aortic atherosclerosis. Left-sided pacemaker device in place with lead tips projecting over the expected location of the right atrial appendage and near the right ventricular apex. Status post bilateral shoulder arthroplasty. IMPRESSION: 1. Lung volumes with no radiographic evidence of acute cardiopulmonary disease. 2. Cardiomegaly with left atrial dilatation. 3. Aortic atherosclerosis. Electronically Signed   By: Vinnie Langton M.D.   On: 11/21/2016 21:47   Ct Abdomen Pelvis W Contrast  Result Date: 11/21/2016 CLINICAL DATA:  Abdominal pain for 2 days, atrial fibrillation. History of duodenum ulcer, diverticulosis, gastric bypass, cholecystectomy, appendectomy. EXAM: CT ABDOMEN AND PELVIS WITH CONTRAST TECHNIQUE: Multidetector CT imaging of the abdomen and pelvis was performed using the standard protocol following bolus administration of intravenous contrast. CONTRAST:  1105mL ISOVUE-300 IOPAMIDOL (ISOVUE-300) INJECTION 61% COMPARISON:  None. FINDINGS: LOWER CHEST: RIGHT lung base dependent atelectasis. Included heart size is moderately enlarged. No parapharyngeal effusion. Pacemaker in place. HEPATOBILIARY: Status post cholecystectomy. Moderate biliary dilatation may be post procedural. PANCREAS: Atrophic, nonacute. SPLEEN: Normal. ADRENALS/URINARY TRACT: Kidneys are orthotopic, demonstrating symmetric enhancement. Severe scarring bilateral kidneys with focal atrophy LEFT interpolar kidney. 15 mm RIGHT interpolar cyst. No nephrolithiasis, hydronephrosis or solid renal masses. The unopacified ureters are normal in course and caliber. Delayed imaging through the kidneys demonstrates symmetric prompt contrast excretion within the proximal urinary collecting system. Urinary bladder is partially distended and unremarkable. Normal adrenal glands. STOMACH/BOWEL: Assessment limited without oral contrast. Moderate hiatal hernia. Status  post  apparent cast dura jejunostomy. Focal 2.8 cm cyst gastric anastomosis. Extensive gastric luminal hyper re- media and inflammatory changes in the upper abdomen. Focal fluid and inflammation at proximal duodenum. 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 Severe colonic diverticulosis. VASCULAR/LYMPHATIC: 3.5 cm infrarenal aortic aneurysm. SMA and IMA not identified though not tailored for evaluation. No lymphadenopathy by CT size criteria. REPRODUCTIVE: Status post hysterectomy. OTHER: Moderate intraperitoneal free air predominately in the upper abdomen. Small amount of ascites. No drainable fluid collection. Small fat containing LEFT inguinal hernia. Small fat containing umbilical hernia. MUSCULOSKELETAL: Nonacute. Old LEFT superior and inferior pubic rami fractures. Sub chronic deformities of bilateral iliac bone, possible donor site. Status post L3-4 PLIF. Broad levoscoliosis. Severe RIGHT, moderate LEFT hip osteoarthrosis. Asymmetric RIGHT pelvic muscle atrophy. RIGHT hip effusion. Old RIGHT rib fractures. Subcentimeter sclerotic focus LEFT fifth rib. IMPRESSION: 1. Pneumoperitoneum and extensive inflammatory changes in the upper abdomen consistent with stomach or proximal small bowel perforation. Status post gastrojejunostomy and suspected extensive revisions. No abscess. 2. Status post cholecystectomy with greater than expected though potentially postprocedural intrahepatic biliary dilatation. 3. **An incidental finding of potential clinical significance has been found. 3.5 cm infrarenal aortic aneurysm. Recommend followup by ultrasound in 2 years. This recommendation follows ACR consensus guidelines: White Paper of the ACR Incidental Findings Committee II on Vascular Findings. J Am Coll Radiol 2013; 10:789-794.** 4. Sclerotic lesion LEFT anterior fifth rib. If there is a history of cancer, consider bone scan. 5. Critical Value/emergent results were called by telephone at the time of interpretation on  11/21/2016 at 11:00 pm to Dr. Virgel Manifold , who verbally acknowledged these results. Electronically Signed   By: Elon Alas M.D.   On: 11/21/2016 23:04    Anti-infectives: Anti-infectives (From admission, onward)   Start     Dose/Rate Route Frequency Ordered Stop   11/22/16 0600  piperacillin-tazobactam (ZOSYN) IVPB 3.375 g    Comments:  Pharmacy may adjust dose as indicated   3.375 g 12.5 mL/hr over 240 Minutes Intravenous Every 8 hours 11/22/16 0010     11/21/16 2315  piperacillin-tazobactam (ZOSYN) IVPB 3.375 g     3.375 g 100 mL/hr over 30 Minutes Intravenous  Once 11/21/16 2301 11/22/16 0521      Assessment/Plan: Perforated gastrojejunostomy s/p exploratory laparotomy and revision of G-J - 11/22/16 - Dr. Kae Heller Atrial fibrillation GERD Irritable bowel syndrome Pacemaker  Will transfer to floor - telemetry Consult [facility] to help manage medical issues. NPO until bowel function.  Will need gastrografin study prior to PO's   LOS: 0 days    Greyson Riccardi K. 11/22/2016

## 2016-11-22 NOTE — Transfer of Care (Signed)
Immediate Anesthesia Transfer of Care Note  Patient: Deanna Spencer  Procedure(s) Performed: EXPLORATORY LAPAROTOMY, REVISION OF  GASTROJEJUNOSTOMY, ESOPHAGOGASTROSCOPY (N/A Abdomen)  Patient Location: PACU  Anesthesia Type:General  Level of Consciousness: awake, oriented and patient cooperative  Airway & Oxygen Therapy: Patient Spontanous Breathing and Patient connected to nasal cannula oxygen  Post-op Assessment: Report given to RN, Post -op Vital signs reviewed and stable and Patient moving all extremities X 4  Post vital signs: Reviewed and stable  Last Vitals:  Vitals:   11/21/16 2300 11/21/16 2315  BP: 110/67 101/66  Pulse:    Resp: 20 17  Temp:    SpO2:      Last Pain:  Vitals:   11/21/16 2352  TempSrc:   PainSc: 9          Complications: No apparent anesthesia complications

## 2016-11-22 NOTE — Op Note (Signed)
Name:  Deanna Spencer MRN: [id number] Date of Surgery: 11/22/2016  Preop Diagnosis:  History of RYGB, Perforated gastrojejunal anastomosis  Postop Diagnosis: History of RYGB, perforated gastrojejunal anastomosis with destruction almost the entire gastrojejunostomy  Procedure:  Upper endoscopy  (Intraoperative)  Surgeon:  Alphonsa Overall, M.D.  Anesthesia:  GET  Indications for procedure: [name] is a 76 y.o. female whose primary care physician is Nuala Alpha, DO.  The patient presented with a perforated gastrojejunal anastomosis after a history of a RYGB.  Dr. Kae Heller has done a revision of the gastrojejunostomy.  I am doing an intraoperative upper endoscopy to evaluate the gastric pouch and the gastro-jejunal anastomosis.  Operative Note: The patient is under general anesthesia.  This is an open operation.  Dr. Kae Heller has the small bowel clamped while I do an upper endoscopy to evaluate the stomach pouch and gastrojejunal anastomosis.  With the patient intubated, I passed the Pentax endoscope without difficulty down the esophagus.  It was difficult for me to tell whether the patient had a hiatal hernia.  The esophago-gastric junction was at 38 cm.  The gastro-jejunal anastomosis was at 43 cm.  The mucosa of the stomach looked viable and the staple line was intact without bleeding.  The gastro-jejunal anastomosis looked okay.  While I insufflated the stomach pouch with air, Dr. Kae Heller clamped off the efferent limb of the jejunum.  She then flooded the upper abdomen with saline to put the gastric pouch and gastro-jejunal anastomosis under saline.  There was no bubbling or evidence of a leak.    The scope was then withdrawn.  The esophagus was unremarkable and the patient tolerated the endoscopy without difficulty.  Alphonsa Overall, MD, [facility] Surgery Pager: [phone] Office phone:  [phone]

## 2016-11-22 NOTE — Anesthesia Preprocedure Evaluation (Signed)
Anesthesia Evaluation  Patient identified by MRN, date of birth, ID band Patient awake    Reviewed: Allergy & Precautions, NPO status , Patient's Chart, lab work & pertinent test results  History of Anesthesia Complications Negative for: history of anesthetic complications  Airway Mallampati: I  TM Distance: >3 FB Neck ROM: Full    Dental  (+)    Pulmonary neg shortness of breath, neg sleep apnea, neg COPD, neg recent URI, neg PE   breath sounds clear to auscultation       Cardiovascular + Past MI  + dysrhythmias Atrial Fibrillation + pacemaker  Rhythm:Irregular     Neuro/Psych neg Seizures  Neuromuscular disease negative psych ROS   GI/Hepatic Neg liver ROS, PUD, GERD  Medicated,H/o gastric bypass, peptic ulcers, gi bleed presenting with perforated viscous after abdominal pain x 2 days   Endo/Other  negative endocrine ROS  Renal/GU negative Renal ROS     Musculoskeletal   Abdominal   Peds  Hematology negative hematology ROS (+)   Anesthesia Other Findings   Reproductive/Obstetrics                             Anesthesia Physical Anesthesia Plan  ASA: III and emergent  Anesthesia Plan: General   Post-op Pain Management:    Induction: Intravenous  PONV Risk Score and Plan: 3 and Ondansetron and Dexamethasone  Airway Management Planned: Oral ETT  Additional Equipment: None  Intra-op Plan:   Post-operative Plan: Extubation in OR and Possible Post-op intubation/ventilation  Informed Consent: I have reviewed the patients History and Physical, chart, labs and discussed the procedure including the risks, benefits and alternatives for the proposed anesthesia with the patient or authorized representative who has indicated his/her understanding and acceptance.   Dental advisory given  Plan Discussed with: CRNA and Surgeon  Anesthesia Plan Comments:         Anesthesia Quick  Evaluation

## 2016-11-22 NOTE — Progress Notes (Signed)
Pt's pacemaker wasn't resuscitated per anesthesia.

## 2016-11-22 NOTE — Consult Note (Signed)
.. ..  Name: Deanna Spencer MRN: [id number] DOB: 29-Nov-1940    ADMISSION DATE:  11/21/2016 CONSULTATION DATE: 11/22/16 REFERRING MD :  Vikki Ports A ConnorMD  CHIEF COMPLAINT:  Abdominal pain  BRIEF PATIENT DESCRIPTION:  76 yr old woman w/ PMHx of Roux-en-Y gastric bypass approximately 15 yrs ago who presented on 11/12 with abdominal pain and was admitted to surgical service with a perforated viscus. Now POD 0  Exploratory laparotomy with Revision of gastrojejunostomy and Esophagogastroscopy.     SIGNIFICANT EVENTS  POD 0 ex lap for perforated viscus  STUDIES:     HISTORY OF PRESENT ILLNESS:  76 yr old female with PMHx of GIB, IBS, Duodenal ulcer, GERD, Diverticulosis, RA, Anemia, PPM, MI, and antecolic Roux- en-Y Gastric Bypass approximately 15 years ago complicated by recalcitrant marginal ulcers with multiple episodes of bleeding  presented on 11/21/16 with chief complaint of abdominal pain. She has had laparoscopic revision of her gastrojejunostomy twice by her surgeon in [place].   11/21/16 CT ABDOMEN PELVIS: 1. Pneumoperitoneum and extensive inflammatory changes in the upper abdomen consistent with stomach or proximal small bowel perforation. Status post gastrojejunostomy and suspected extensive revisions. No abscess. 2. Status post cholecystectomy with greater than expected though potentially postprocedural intrahepatic biliary dilatation. 3. An incidental finding of potential clinical significance has been found. 3.5 cm infrarenal aortic aneurysm.  4. Sclerotic lesion LEFT anterior fifth rib.   Pt received Exploratory laparotomy with Revision of gastrojejunostomy and Esophagogastroscopy was extubated successfully. PCCM consulted for post operative management.    PAST MEDICAL HISTORY :   has a past medical history of Anemia, Arthritis, Diverticulosis, Duodenal ulcer, Dyspnea, Dysrhythmia, GERD (gastroesophageal reflux disease), GI bleed, Gout, Heart attack ([place]), blood clots, IBS  (irritable bowel syndrome), Neuromuscular disorder ([place]), Presence of permanent cardiac pacemaker, and Stomach ulcer.  has a past surgical history that includes Appendectomy; Abdominal hysterectomy; Bariatric Surgery; Hernia repair; Cholecystectomy; Insert / replace / remove pacemaker; Joint replacement; Spinal fusion; fusion r foot; Shoulder surgery; and watchman device . Prior to Admission medications   Medication Sig Start Date End Date Taking? Authorizing Provider  acetaminophen (TYLENOL) 500 MG tablet Take 1,000 mg every 6 (six) hours as needed by mouth (pain).    Yes [provider]  Alpha-D-Galactosidase Satira Mccallum) TABS Take 2 tablets 3 (three) times daily as needed by mouth (gas).    Yes [provider]  aspirin EC 81 MG tablet Take 81 mg daily by mouth.   Yes [provider]  aspirin EC 81 MG tablet Take 324 mg once by mouth.   Yes [provider]  cetirizine (ZYRTEC) 10 MG tablet Take 10 mg daily by mouth.   Yes [provider]  colchicine 0.6 MG tablet Take 0.6 mg 2 (two) times daily as needed by mouth (gout attacks).    Yes [provider]  ferrous sulfate 325 (65 FE) MG tablet Take 325 mg daily by mouth.   Yes [provider]  flintstones complete (FLINTSTONES) 60 MG chewable tablet Chew 1 tablet 3 (three) times daily by mouth.   Yes [provider]  gabapentin (NEURONTIN) 300 MG capsule Take 1 capsule (300 mg total) 3 (three) times daily by mouth. 11/15/16  Yes Nuala Alpha, DO  HYDROcodone-acetaminophen (NORCO/VICODIN) 5-325 MG tablet Take 1 tablet every 6 (six) hours as needed by mouth for severe pain.    Yes [provider]  loperamide (IMODIUM) 2 MG capsule Take 2 mg 3 (three) times daily as needed by mouth for diarrhea  or loose stools.   Yes [provider]  mirabegron ER (MYRBETRIQ) 50 MG TB24 tablet Take 1 tablet (50 mg total) daily by mouth. 11/15/16  Yes Nuala Alpha, DO  Multiple  Vitamin (MULTIVITAMIN WITH MINERALS) TABS tablet Take 1 tablet daily by mouth. Centrum 50+   Yes [provider]  omeprazole (PRILOSEC) 20 MG capsule Take 20 mg daily before breakfast by mouth.    Yes [provider]  OVER THE COUNTER MEDICATION Take See admin instructions by mouth. Take 2 drops CBD oil 500 by mouth twice daily (for pain)   Yes [provider]  [place] Take See admin instructions by mouth. Take one CBD gummie by mouth four times daily for pain   Yes [provider]  rOPINIRole (REQUIP) 0.5 MG tablet Take 1 tablet (0.5 mg total) 3 (three) times daily by mouth. Patient taking differently: Take 0.5-1 mg See admin instructions by mouth. Take 1 tablet (0.5 mg) in the afternoon & 2 tablets (1 mg) by mouth in the evening. 11/15/16  Yes Lockamy, Timothy, DO  sucralfate (CARAFATE) 1 GM/10ML suspension Take 1 g 4 (four) times daily by mouth.   Yes [provider]   Allergies  Allergen Reactions  . Demerol [Meperidine] Anaphylaxis    FAMILY HISTORY:  family history includes Heart disease in her mother; Liver disease in her father; Prostate cancer in her brother; Ulcers in her father. SOCIAL HISTORY:  reports that  has never smoked. she has never used smokeless tobacco. She reports that she does not drink alcohol or use drugs.  REVIEW OF SYSTEMS:  bolded items are pertinent positives Constitutional: Negative for fever, chills, weight loss, malaise/fatigue and diaphoresis.  HENT: Negative for hearing loss, ear pain, nosebleeds, congestion, sore throat, neck pain, tinnitus and ear discharge.   Eyes: Negative for blurred vision, double vision, photophobia, pain, discharge and redness.  Respiratory: Negative for cough, hemoptysis, sputum production, shortness of breath, wheezing and stridor.   Cardiovascular: Negative for chest pain, palpitations, orthopnea, claudication, leg swelling and PND.  Gastrointestinal: Negative for  heartburn, nausea, vomiting, abdominal pain, diarrhea, constipation, blood in stool and melena.  Genitourinary: Negative for dysuria, urgency, frequency, hematuria and flank pain.  Musculoskeletal: Negative for myalgias, back pain, joint pain and falls.  Skin: Negative for itching and rash.  Neurological: Negative for dizziness, tingling, tremors, sensory change, speech change, focal weakness, seizures, loss of consciousness, weakness and headaches.  Endo/Heme/Allergies: Negative for environmental allergies and polydipsia. Does not bruise/bleed easily.  SUBJECTIVE:   VITAL SIGNS: Temp:  [97.3 F (36.3 C)-97.8 F (36.6 C)] 97.8 F (36.6 C) (11/13 0616) Pulse Rate:  [97-117] 108 (11/13 0430) Resp:  [12-23] 23 (11/13 0545) BP: (101-167)/(66-138) 153/95 (11/13 0429) SpO2:  [96 %-100 %] 97 % (11/13 0545) Weight:  [52.2 kg (115 lb)] 52.2 kg (115 lb) (11/12 2056)  PHYSICAL EXAMINATION: General:  Elderly female somnolent Neuro:  No focal deficits appreciated EOM intact HEENT: normocephalic atraumatic  Cardiovascular:  S1 and S2 noted with irregularly irregular heart rate Lungs: decreased breath sounds at bases  Abdomen:  Closed with dressing clean and dry. Right JP drain.  GU: indwelling foley catheter Musculoskeletal:  No obvious deformities Skin:  Grossly intact no evidence of rash  Recent Labs  Lab 11/17/16 0953 11/21/16 2056  NA 141 139  K 4.6 3.6  CL 109 107  CO2 23 21*  BUN 21 19  CREATININE 0.75 0.97  GLUCOSE 97 117*   Recent Labs  Lab  11/17/16 0953 11/21/16 2056  HGB 12.5 14.5  HCT 38.6 44.7  WBC 6.6 12.3*  PLT 418.0* 447*   Dg Chest 2 View  Result Date: 11/21/2016 CLINICAL DATA:  76 year old female with history of atrial fibrillation. EXAM: CHEST  2 VIEW COMPARISON:  No priors. FINDINGS: Lung volumes are low. Mild elevation of the right hemidiaphragm. No acute consolidative airspace disease. No pleural effusions. No evidence of pulmonary edema. Heart size is  mildly enlarged with left atrial dilatation. The patient is rotated to the right on today's exam, resulting in distortion of the mediastinal contours and reduced diagnostic sensitivity and specificity for mediastinal pathology. Aortic atherosclerosis. Left-sided pacemaker device in place with lead tips projecting over the expected location of the right atrial appendage and near the right ventricular apex. Status post bilateral shoulder arthroplasty. IMPRESSION: 1. Lung volumes with no radiographic evidence of acute cardiopulmonary disease. 2. Cardiomegaly with left atrial dilatation. 3. Aortic atherosclerosis. Electronically Signed   By: Vinnie Langton M.D.   On: 11/21/2016 21:47   Ct Abdomen Pelvis W Contrast  Result Date: 11/21/2016 CLINICAL DATA:  Abdominal pain for 2 days, atrial fibrillation. History of duodenum ulcer, diverticulosis, gastric bypass, cholecystectomy, appendectomy. EXAM: CT ABDOMEN AND PELVIS WITH CONTRAST TECHNIQUE: Multidetector CT imaging of the abdomen and pelvis was performed using the standard protocol following bolus administration of intravenous contrast. CONTRAST:  174mL ISOVUE-300 IOPAMIDOL (ISOVUE-300) INJECTION 61% COMPARISON:  None. FINDINGS: LOWER CHEST: RIGHT lung base dependent atelectasis. Included heart size is moderately enlarged. No parapharyngeal effusion. Pacemaker in place. HEPATOBILIARY: Status post cholecystectomy. Moderate biliary dilatation may be post procedural. PANCREAS: Atrophic, nonacute. SPLEEN: Normal. ADRENALS/URINARY TRACT: Kidneys are orthotopic, demonstrating symmetric enhancement. Severe scarring bilateral kidneys with focal atrophy LEFT interpolar kidney. 15 mm RIGHT interpolar cyst. No nephrolithiasis, hydronephrosis or solid renal masses. The unopacified ureters are normal in course and caliber. Delayed imaging through the kidneys demonstrates symmetric prompt contrast excretion within the proximal urinary collecting system. Urinary bladder is  partially distended and unremarkable. Normal adrenal glands. STOMACH/BOWEL: Assessment limited without oral contrast. Moderate hiatal hernia. Status post apparent cast dura jejunostomy. Focal 2.8 cm cyst gastric anastomosis. Extensive gastric luminal hyper re- media and inflammatory changes in the upper abdomen. Focal fluid and inflammation at proximal duodenum. 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 Severe colonic diverticulosis. VASCULAR/LYMPHATIC: 3.5 cm infrarenal aortic aneurysm. SMA and IMA not identified though not tailored for evaluation. No lymphadenopathy by CT size criteria. REPRODUCTIVE: Status post hysterectomy. OTHER: Moderate intraperitoneal free air predominately in the upper abdomen. Small amount of ascites. No drainable fluid collection. Small fat containing LEFT inguinal hernia. Small fat containing umbilical hernia. MUSCULOSKELETAL: Nonacute. Old LEFT superior and inferior pubic rami fractures. Sub chronic deformities of bilateral iliac bone, possible donor site. Status post L3-4 PLIF. Broad levoscoliosis. Severe RIGHT, moderate LEFT hip osteoarthrosis. Asymmetric RIGHT pelvic muscle atrophy. RIGHT hip effusion. Old RIGHT rib fractures. Subcentimeter sclerotic focus LEFT fifth rib. IMPRESSION: 1. Pneumoperitoneum and extensive inflammatory changes in the upper abdomen consistent with stomach or proximal small bowel perforation. Status post gastrojejunostomy and suspected extensive revisions. No abscess. 2. Status post cholecystectomy with greater than expected though potentially postprocedural intrahepatic biliary dilatation. 3. **An incidental finding of potential clinical significance has been found. 3.5 cm infrarenal aortic aneurysm. Recommend followup by ultrasound in 2 years. This recommendation follows ACR consensus guidelines: White Paper of the ACR Incidental Findings Committee II on Vascular Findings. J Am Coll Radiol 2013; 10:789-794.** 4. Sclerotic lesion LEFT anterior fifth rib.  If  there is a history of cancer, consider bone scan. 5. Critical Value/emergent results were called by telephone at the time of interpretation on 11/21/2016 at 11:00 pm to Dr. Virgel Manifold , who verbally acknowledged these results. Electronically Signed   By: Elon Alas M.D.   On: 11/21/2016 23:04    ASSESSMENT / PLAN: NEURO: 1. Pain Management GCS:15 Not on sedation  Continue Pain control with Dilaudid PCA 0.2 mg Q4H- may need to reassess dose  CARDIAC: H/o MI- was on Aspirin continue to hold in post operative period Afib- previously on Coumadin for a brief interim - currently in Afib HR <120 -no anticoagulation due to risk of bleeding  Incidental finding of 3.5 cm infrarenal aortic aneurysm - recommend f/u US in 2 yrs - no immediate intervention necessary  HTN - elevated BP postop can be due to pain - continue analgesia - in addition prn metoprolol for SBP>156mmHg  PULMONARY: Intubated for exploratory lap Was extubated successfully Continues on [place] Titrate to keep Sats >92% Incentive spirometry  ID: Perforated Viscus Continue on empiric abx coverage with Zosyn Trend WBC, fever curve  Endocrine: No h/o insulin dependence  If BG increases will start Phase 1 ICU Glycemic control  GI: NPO No OGT Diet to be advanced per Surgery recs PPI  Heme: H/o Anemia  Hemoglobin 14.5 Platelets 447 Hold ASA  DVT PPx-> SCDs only  RENAL At Baseline Cr continue with indwelling foley  Strict Is/Os .Marland Kitchen Lab Results  Component Value Date   CREATININE 0.97 11/21/2016   CREATININE 0.75 11/17/2016  Correct Electrolyte imbalances  Norm range:P K+ 4, Phos 2 Mg 2   I, Dr Seward Carol have personally reviewed patient's available data, including medical history, events of note, physical examination and test results as part of my evaluation. I have discussed with other care providers such as pharmacist, RN and Warren Lacy The patient is critically ill with multiple organ systems  failure and requires high complexity decision making for assessment and support, frequent evaluation and titration of therapies, application of advanced monitoring technologies and extensive interpretation of multiple databases. Critical Care Time devoted to patient care services described in this note is 2  Minutes. This time reflects time of care of this signee Dr Seward Carol. This critical care time does not reflect procedure time, or teaching time or supervisory time but could involve care discussion time    DISPOSITION:ICU CC TIME: 55 mins PROGNOSIS: GUARDED FAMILY: NOT AT BEDSIDE.    Signed Dr Seward Carol Pulmonary Critical Care Locums [facility] and [place] Pager: [phone]  11/22/2016, 6:21 AM

## 2016-11-22 NOTE — Progress Notes (Addendum)
Per Nat Math, RN Dr. Georgette Dover says to hold off on PICC at present.  Carolee Rota, RN VAST

## 2016-11-22 NOTE — Anesthesia Procedure Notes (Signed)
Procedure Name: Intubation Date/Time: 11/22/2016 12:42 AM Performed by: Claris Che, CRNA Pre-anesthesia Checklist: Patient identified, Emergency Drugs available, Suction available, Patient being monitored and Timeout performed Patient Re-evaluated:Patient Re-evaluated prior to induction Oxygen Delivery Method: Circle system utilized Preoxygenation: Pre-oxygenation with 100% oxygen Induction Type: IV induction, Rapid sequence and Cricoid Pressure applied Laryngoscope Size: Mac and 3 Grade View: Grade II Tube type: Subglottic suction tube Tube size: 7.5 mm Number of attempts: 1 Airway Equipment and Method: Stylet Placement Confirmation: ETT inserted through vocal cords under direct vision,  positive ETCO2 and breath sounds checked- equal and bilateral Secured at: 22 cm Tube secured with: Tape Dental Injury: Teeth and Oropharynx as per pre-operative assessment

## 2016-11-23 ENCOUNTER — Other Ambulatory Visit: Payer: Self-pay

## 2016-11-23 ENCOUNTER — Encounter (HOSPITAL_COMMUNITY): Payer: Self-pay | Admitting: General Practice

## 2016-11-23 DIAGNOSIS — K219 Gastro-esophageal reflux disease without esophagitis: Secondary | ICD-10-CM

## 2016-11-23 DIAGNOSIS — N3289 Other specified disorders of bladder: Secondary | ICD-10-CM

## 2016-11-23 DIAGNOSIS — M1611 Unilateral primary osteoarthritis, right hip: Secondary | ICD-10-CM

## 2016-11-23 LAB — CBC
HCT: 32.6 % — ABNORMAL LOW (ref 36.0–46.0)
Hemoglobin: 10.2 g/dL — ABNORMAL LOW (ref 12.0–15.0)
MCH: 30.5 pg (ref 26.0–34.0)
MCHC: 31.3 g/dL (ref 30.0–36.0)
MCV: 97.6 fL (ref 78.0–100.0)
PLATELETS: 292 10*3/uL (ref 150–400)
RBC: 3.34 MIL/uL — ABNORMAL LOW (ref 3.87–5.11)
RDW: 20.2 % — ABNORMAL HIGH (ref 11.5–15.5)
WBC: 12.6 10*3/uL — ABNORMAL HIGH (ref 4.0–10.5)

## 2016-11-23 LAB — BASIC METABOLIC PANEL
ANION GAP: 8 (ref 5–15)
BUN: 12 mg/dL (ref 6–20)
CHLORIDE: 112 mmol/L — AB (ref 101–111)
CO2: 21 mmol/L — ABNORMAL LOW (ref 22–32)
Calcium: 7.3 mg/dL — ABNORMAL LOW (ref 8.9–10.3)
Creatinine, Ser: 0.86 mg/dL (ref 0.44–1.00)
GFR calc Af Amer: >60 mL/min (ref >60)
GLUCOSE: 84 mg/dL (ref 65–99)
POTASSIUM: 3.9 mmol/L (ref 3.5–5.1)
Sodium: 141 mmol/L (ref 135–145)

## 2016-11-23 LAB — MAGNESIUM: Magnesium: 1.5 mg/dL — ABNORMAL LOW (ref 1.7–2.4)

## 2016-11-23 MED ORDER — MAGNESIUM SULFATE 2 GM/50ML IV SOLN
2.0000 g | Freq: Once | INTRAVENOUS | Status: AC
  Administered 2016-11-23: 2 g via INTRAVENOUS
  Filled 2016-11-23: qty 50

## 2016-11-23 MED ORDER — ACETAMINOPHEN 10 MG/ML IV SOLN
1000.0000 mg | Freq: Four times a day (QID) | INTRAVENOUS | Status: AC
  Administered 2016-11-23 – 2016-11-24 (×4): 1000 mg via INTRAVENOUS
  Filled 2016-11-23 (×4): qty 100

## 2016-11-23 MED ORDER — PANTOPRAZOLE SODIUM 40 MG IV SOLR
40.0000 mg | Freq: Two times a day (BID) | INTRAVENOUS | Status: DC
  Administered 2016-11-23 – 2016-11-27 (×9): 40 mg via INTRAVENOUS
  Filled 2016-11-23 (×9): qty 40

## 2016-11-23 NOTE — Progress Notes (Signed)
2 Days Post-Op    CC:  Abdominal pain x 2 day  Subjective: She complains of pain and is tender all over.  She also complains of the PCA and IV machines making continuous noise.  She says it gives her a headache and drives her nuts.  No bowel sounds no flatus so far.  She has not been out of bed nor had her dressing been changed till today.  Objective: Vital signs in last 24 hours: Temp:  [98 F (36.7 C)-98.9 F (37.2 C)] 98 F (36.7 C) (11/14 0505) Pulse Rate:  [92-123] 97 (11/14 0505) Resp:  [9-21] 21 (11/14 0756) BP: (117-139)/(61-92) 138/85 (11/14 0505) SpO2:  [93 %-100 %] 94 % (11/14 0756) FiO2 (%):  [2 %] 2 % (11/14 0235) Last BM Date: (PTA) NPO 3143 IV 1025 urine 365 urine recorded Afebrile VSS WBC improving, BMP OK  Mag is low  Intake/Output from previous day: 11/13 0701 - 11/14 0700 In: 3143.8 [I.V.:2893.8; IV Piggyback:250] Out: 1390 [Urine:1025; Drains:365] Intake/Output this shift: No intake/output data recorded.  General appearance: alert, cooperative and upset with pain and constant chirping of the IV/PCA machines. Resp: clear anterior exam GI: Tender, to any touch, no BS, No flatus, incision looks fine.  Drain is serosanguinous fluid.    Lab Results:  Recent Labs    11/22/16 0631 11/23/16 0614  WBC 14.7* 12.6*  HGB 11.4* 10.2*  HCT 36.0 32.6*  PLT 352 292    BMET Recent Labs    11/22/16 0631 11/23/16 0614  NA 139 141  K 3.4* 3.9  CL 108 112*  CO2 21* 21*  GLUCOSE 193* 84  BUN 14 12  CREATININE 0.82 0.86  CALCIUM 8.2* 7.3*   PT/INR Recent Labs    11/21/16 2056  LABPROT 11.8  INR 0.88    Recent Labs  Lab 11/22/16 0631  AST 73*  ALT 36  ALKPHOS 41  BILITOT 0.8  PROT 6.2*  ALBUMIN 3.5     Lipase     Component Value Date/Time   LIPASE 24 11/21/2016 2056     Prior to Admission medications   Medication Sig Start Date End Date Taking? Authorizing Provider  acetaminophen (TYLENOL) 500 MG tablet Take 1,000 mg every 6 (six)  hours as needed by mouth (pain).    Yes [provider]  Alpha-D-Galactosidase Satira Mccallum) TABS Take 2 tablets 3 (three) times daily as needed by mouth (gas).    Yes [provider]  aspirin EC 81 MG tablet Take 81 mg daily by mouth.   Yes [provider]  aspirin EC 81 MG tablet Take 324 mg once by mouth.   Yes [provider]  cetirizine (ZYRTEC) 10 MG tablet Take 10 mg daily by mouth.   Yes [provider]  colchicine 0.6 MG tablet Take 0.6 mg 2 (two) times daily as needed by mouth (gout attacks).    Yes [provider]  ferrous sulfate 325 (65 FE) MG tablet Take 325 mg daily by mouth.   Yes [provider]  flintstones complete (FLINTSTONES) 60 MG chewable tablet Chew 1 tablet 3 (three) times daily by mouth.   Yes [provider]  gabapentin (NEURONTIN) 300 MG capsule Take 1 capsule (300 mg total) 3 (three) times daily by mouth. 11/15/16  Yes Nuala Alpha, DO  HYDROcodone-acetaminophen (NORCO/VICODIN) 5-325 MG tablet Take 1 tablet every 6 (six) hours as needed by mouth for severe pain.    Yes [provider]  loperamide (IMODIUM) 2 MG  capsule Take 2 mg 3 (three) times daily as needed by mouth for diarrhea or loose stools.   Yes [provider]  mirabegron ER (MYRBETRIQ) 50 MG TB24 tablet Take 1 tablet (50 mg total) daily by mouth. 11/15/16  Yes Nuala Alpha, DO  Multiple Vitamin (MULTIVITAMIN WITH MINERALS) TABS tablet Take 1 tablet daily by mouth. Centrum 50+   Yes [provider]  omeprazole (PRILOSEC) 20 MG capsule Take 20 mg daily before breakfast by mouth.    Yes [provider]  OVER THE COUNTER MEDICATION Take See admin instructions by mouth. Take 2 drops CBD oil 500 by mouth twice daily (for pain)   Yes [provider]  Brenas Take See admin instructions by mouth. Take one CBD gummie by mouth four times daily for pain   Yes [provider]   rOPINIRole (REQUIP) 0.5 MG tablet Take 1 tablet (0.5 mg total) 3 (three) times daily by mouth. Patient taking differently: Take 0.5-1 mg See admin instructions by mouth. Take 1 tablet (0.5 mg) in the afternoon & 2 tablets (1 mg) by mouth in the evening. 11/15/16  Yes Lockamy, Timothy, DO  sucralfate (CARAFATE) 1 GM/10ML suspension Take 1 g 4 (four) times daily by mouth.   Yes [provider]    Medications: . gabapentin  300 mg Oral TID  . heparin  5,000 Units Subcutaneous Q8H  . HYDROmorphone   Intravenous Q4H  . mouth rinse  15 mL Mouth Rinse BID  . mirabegron ER  50 mg Oral Daily  . [START ON 11/25/2016] pantoprazole  40 mg Intravenous Q12H  . rOPINIRole  0.5 mg Oral TID   . sodium chloride 125 mL/hr at 11/22/16 1742  . pantoprozole (PROTONIX) infusion 8 mg/hr (11/22/16 2323)  . piperacillin-tazobactam (ZOSYN)  IV 3.375 g (11/23/16 0504)   Anti-infectives (From admission, onward)   Start     Dose/Rate Route Frequency Ordered Stop   11/22/16 0600  piperacillin-tazobactam (ZOSYN) IVPB 3.375 g    Comments:  Pharmacy may adjust dose as indicated   3.375 g 12.5 mL/hr over 240 Minutes Intravenous Every 8 hours 11/22/16 0010     11/21/16 2315  piperacillin-tazobactam (ZOSYN) IVPB 3.375 g     3.375 g 100 mL/hr over 30 Minutes Intravenous  Once 11/21/16 2301 11/22/16 0521      Assessment/Plan Perforated viscus/Hx of Roux-en Y  Large dehiscence of the anterior aspect of her gastrojejunostomy S/p Exploratory laparotomy,  Revision of gastrojejunostomy,  Esophagogastroscopy, 11/22/16, Dr. Romana Juniper Hypomagnesemia Hx of MI/PTVP  IBS/GI bleed Hx of RA History of Raynaud's syndrome Restless leg syndrome Gout Peripheral neuropathy FEN: IV fluids/NPO ID:  Zosyn 11/12 =>> day 3 DVT:  Heparin Foley: in - will DC tomorrow Follow up:  Dr. Kae Heller   Plan:  IV tylenol, I offered to take the PCA machine away but told her she would have to rely on the nurses to bring her pain  medication boluses.  Pain and anxiety are really her biggest issues right now.     LOS: 1 day    Macaiah Mangal 11/23/2016 [phone]

## 2016-11-23 NOTE — Progress Notes (Signed)
PROGRESS NOTE    Deanna Spencer  OEH:[id number] DOB: 1940-08-08 DOA: 11/21/2016 PCP: Nuala Alpha, DO   Brief Narrative: Deanna Spencer is a 76 y.o. female with a history of hypertension, GERD, anemia, RA, atrial fibrillation. Medicine consulted for management of chronic medical issues. She presented with perforated bowel s/p ex lap on 11/13.   Assessment & Plan:   Active Problems:   Spastic bladder   Primary osteoarthritis of right hip   History of heart attack   S/P exploratory laparotomy   Perforated viscus   Atrial fibrillation (HCC)   Peripheral neuropathy   GERD (gastroesophageal reflux disease)   Atrial fibrillation s/p atrial appendage watchman device History of CAD s/p MI Rate controlled. Not a candidate for anticoagulation per cardiology cardiology recommendations -telemetry  Peripheral neuropathy -Continue Neurontin  Overactive bladder -Continue Myrbetriq  History of GERD -Continue protonix (switch to BID dosing)  Perforated gastrojejunostomy, status post exploratory laparotomy with revision of GJ on 11/22/2016 -Per primary -on PCA pump  Restless leg syndrome -Continue Requip  Anemia History of GI bleeding Hemoglobin stable. No evidence of bleeding.   DVT prophylaxis: heparin subq Code Status: Full code Family Communication: None at bedside Disposition Plan: Discharge per primary   Subjective: Tired. Didn't get much sleep overnight.  Objective: Vitals:   11/23/16 0505 11/23/16 0756 11/23/16 1209 11/23/16 1343  BP: 138/85   133/71  Pulse: 97   (!) 112  Resp: 16 (!) 21 11 17   Temp: 98 F (36.7 C)   98 F (36.7 C)  TempSrc: Oral   Oral  SpO2: 97% 94% 97% 94%  Weight:      Height:        Intake/Output Summary (Last 24 hours) at 11/23/2016 1723 Last data filed at 11/23/2016 1400 Gross per 24 hour  Intake 3182.5 ml  Output 1340 ml  Net 1842.5 ml   Filed Weights   11/21/16 2056  Weight: 52.2 kg (115 lb)     Examination:  General exam: Appears calm and comfortable  Respiratory system: Clear to auscultation. Respiratory effort normal. Cardiovascular system: S1 & S2 heard. Irregular rhythm. Gastrointestinal system: Abdomen is nondistended, soft and tender generally. Normal bowel sounds heard. Dressing clean and intact Central nervous system: Alert and oriented. Extremities: No edema. No calf tenderness Skin: No cyanosis. No rashes Psychiatry: Judgement and insight appear normal. Mood & affect appropriate.     Data Reviewed: I have personally reviewed following labs and imaging studies  CBC: Recent Labs  Lab 11/17/16 0953 11/21/16 2056 11/22/16 0631 11/23/16 0614  WBC 6.6 12.3* 14.7* 12.6*  HGB 12.5 14.5 11.4* 10.2*  HCT 38.6 44.7 36.0 32.6*  MCV 94.3 93.9 95.0 97.6  PLT 418.0* 447* 352 037   Basic Metabolic Panel: Recent Labs  Lab 11/17/16 0953 11/21/16 2056 11/22/16 0631 11/23/16 0614  NA 141 139 139 141  K 4.6 3.6 3.4* 3.9  CL 109 107 108 112*  CO2 23 21* 21* 21*  GLUCOSE 97 117* 193* 84  BUN 21 19 14 12   CREATININE 0.75 0.97 0.82 0.86  CALCIUM 9.3 9.4 8.2* 7.3*  MG  --   --  1.3* 1.5*   GFR: Estimated Creatinine Clearance: 41.2 mL/min (by C-G formula based on SCr of 0.86 mg/dL). Liver Function Tests: Recent Labs  Lab 11/22/16 0631  AST 73*  ALT 36  ALKPHOS 41  BILITOT 0.8  PROT 6.2*  ALBUMIN 3.5   Recent Labs  Lab 11/21/16 2056  LIPASE 24   No  results for input(s): AMMONIA in the last 168 hours. Coagulation Profile: Recent Labs  Lab 11/21/16 2056  INR 0.88   Cardiac Enzymes: No results for input(s): CKTOTAL, CKMB, CKMBINDEX, TROPONINI in the last 168 hours. BNP (last 3 results) No results for input(s): PROBNP in the last 8760 hours. HbA1C: No results for input(s): HGBA1C in the last 72 hours. CBG: Recent Labs  Lab 11/22/16 0613  GLUCAP 202*   Lipid Profile: No results for input(s): CHOL, HDL, LDLCALC, TRIG, CHOLHDL, LDLDIRECT in  the last 72 hours. Thyroid Function Tests: No results for input(s): TSH, T4TOTAL, FREET4, T3FREE, THYROIDAB in the last 72 hours. Anemia Panel: No results for input(s): VITAMINB12, FOLATE, FERRITIN, TIBC, IRON, RETICCTPCT in the last 72 hours. Sepsis Labs: No results for input(s): PROCALCITON, LATICACIDVEN in the last 168 hours.  Recent Results (from the past 240 hour(s))  MRSA PCR Screening     Status: None   Collection Time: 11/22/16  5:02 AM  Result Value Ref Range Status   MRSA by PCR NEGATIVE NEGATIVE Final    Comment:        The GeneXpert MRSA Assay (FDA approved for NASAL specimens only), is one component of a comprehensive MRSA colonization surveillance program. It is not intended to diagnose MRSA infection nor to guide or monitor treatment for MRSA infections.          Radiology Studies: Dg Chest 2 View  Result Date: 11/21/2016 CLINICAL DATA:  76 year old female with history of atrial fibrillation. EXAM: CHEST  2 VIEW COMPARISON:  No priors. FINDINGS: Lung volumes are low. Mild elevation of the right hemidiaphragm. No acute consolidative airspace disease. No pleural effusions. No evidence of pulmonary edema. Heart size is mildly enlarged with left atrial dilatation. The patient is rotated to the right on today's exam, resulting in distortion of the mediastinal contours and reduced diagnostic sensitivity and specificity for mediastinal pathology. Aortic atherosclerosis. Left-sided pacemaker device in place with lead tips projecting over the expected location of the right atrial appendage and near the right ventricular apex. Status post bilateral shoulder arthroplasty. IMPRESSION: 1. Lung volumes with no radiographic evidence of acute cardiopulmonary disease. 2. Cardiomegaly with left atrial dilatation. 3. Aortic atherosclerosis. Electronically Signed   By: Vinnie Langton M.D.   On: 11/21/2016 21:47   Ct Abdomen Pelvis W Contrast  Result Date: 11/21/2016 CLINICAL DATA:   Abdominal pain for 2 days, atrial fibrillation. History of duodenum ulcer, diverticulosis, gastric bypass, cholecystectomy, appendectomy. EXAM: CT ABDOMEN AND PELVIS WITH CONTRAST TECHNIQUE: Multidetector CT imaging of the abdomen and pelvis was performed using the standard protocol following bolus administration of intravenous contrast. CONTRAST:  118mL ISOVUE-300 IOPAMIDOL (ISOVUE-300) INJECTION 61% COMPARISON:  None. FINDINGS: LOWER CHEST: RIGHT lung base dependent atelectasis. Included heart size is moderately enlarged. No parapharyngeal effusion. Pacemaker in place. HEPATOBILIARY: Status post cholecystectomy. Moderate biliary dilatation may be post procedural. PANCREAS: Atrophic, nonacute. SPLEEN: Normal. ADRENALS/URINARY TRACT: Kidneys are orthotopic, demonstrating symmetric enhancement. Severe scarring bilateral kidneys with focal atrophy LEFT interpolar kidney. 15 mm RIGHT interpolar cyst. No nephrolithiasis, hydronephrosis or solid renal masses. The unopacified ureters are normal in course and caliber. Delayed imaging through the kidneys demonstrates symmetric prompt contrast excretion within the proximal urinary collecting system. Urinary bladder is partially distended and unremarkable. Normal adrenal glands. STOMACH/BOWEL: Assessment limited without oral contrast. Moderate hiatal hernia. Status post apparent cast dura jejunostomy. Focal 2.8 cm cyst gastric anastomosis. Extensive gastric luminal hyper re- media and inflammatory changes in the upper abdomen. Focal fluid and inflammation at  proximal duodenum. 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 Severe colonic diverticulosis. VASCULAR/LYMPHATIC: 3.5 cm infrarenal aortic aneurysm. SMA and IMA not identified though not tailored for evaluation. No lymphadenopathy by CT size criteria. REPRODUCTIVE: Status post hysterectomy. OTHER: Moderate intraperitoneal free air predominately in the upper abdomen. Small amount of ascites. No drainable fluid  collection. Small fat containing LEFT inguinal hernia. Small fat containing umbilical hernia. MUSCULOSKELETAL: Nonacute. Old LEFT superior and inferior pubic rami fractures. Sub chronic deformities of bilateral iliac bone, possible donor site. Status post L3-4 PLIF. Broad levoscoliosis. Severe RIGHT, moderate LEFT hip osteoarthrosis. Asymmetric RIGHT pelvic muscle atrophy. RIGHT hip effusion. Old RIGHT rib fractures. Subcentimeter sclerotic focus LEFT fifth rib. IMPRESSION: 1. Pneumoperitoneum and extensive inflammatory changes in the upper abdomen consistent with stomach or proximal small bowel perforation. Status post gastrojejunostomy and suspected extensive revisions. No abscess. 2. Status post cholecystectomy with greater than expected though potentially postprocedural intrahepatic biliary dilatation. 3. **An incidental finding of potential clinical significance has been found. 3.5 cm infrarenal aortic aneurysm. Recommend followup by ultrasound in 2 years. This recommendation follows ACR consensus guidelines: White Paper of the ACR Incidental Findings Committee II on Vascular Findings. J Am Coll Radiol 2013; 10:789-794.** 4. Sclerotic lesion LEFT anterior fifth rib. If there is a history of cancer, consider bone scan. 5. Critical Value/emergent results were called by telephone at the time of interpretation on 11/21/2016 at 11:00 pm to Dr. Virgel Manifold , who verbally acknowledged these results. Electronically Signed   By: Elon Alas M.D.   On: 11/21/2016 23:04        Scheduled Meds: . gabapentin  300 mg Oral TID  . heparin  5,000 Units Subcutaneous Q8H  . HYDROmorphone   Intravenous Q4H  . mouth rinse  15 mL Mouth Rinse BID  . mirabegron ER  50 mg Oral Daily  . [START ON 11/25/2016] pantoprazole  40 mg Intravenous Q12H  . rOPINIRole  0.5 mg Oral TID   Continuous Infusions: . sodium chloride 75 mL/hr at 11/23/16 1400  . acetaminophen Stopped (11/23/16 1329)  . pantoprozole (PROTONIX)  infusion 8 mg/hr (11/23/16 1324)  . piperacillin-tazobactam (ZOSYN)  IV 3.375 g (11/23/16 1425)     LOS: 1 day     Cordelia Poche, MD [facility] 11/23/2016, 5:23 PM Pager: [phone]  If 7PM-7AM, please contact night-coverage [email or web address] Password [email or web address] 11/23/2016, 5:23 PM

## 2016-11-23 NOTE — Telephone Encounter (Signed)
Pt informed. Stated that she was in the hospital because she had stomach surgery. She will get back in contact with [place] when she gets out. Paublo Warshawsky Kennon Holter, CMA

## 2016-11-24 MED ORDER — ACETAMINOPHEN 10 MG/ML IV SOLN
1000.0000 mg | Freq: Four times a day (QID) | INTRAVENOUS | Status: AC
  Administered 2016-11-24 – 2016-11-25 (×4): 1000 mg via INTRAVENOUS
  Filled 2016-11-24 (×4): qty 100

## 2016-11-24 NOTE — Progress Notes (Signed)
Wasted 2 mg of dilaudid with Rakita, RN with change of syringe with tubing change this day at 1645.

## 2016-11-24 NOTE — Progress Notes (Signed)
Patient was in her bed. Daughter on-site. Both patient and daughter were very receptive and appreciative of chaplain's visit. They had a lot to share about their strong family support and christian faith sustenance through all the sickness. Chaplain provided reflective listening and compassionate presence.  Chaplain Tanuj Mullens.

## 2016-11-24 NOTE — Progress Notes (Signed)
3 Days Post-Op    CC: Abdominal pain x 2 day    Subjective: Pt seems to be better this AM, less complaints, OOB some yesterday.  Likes the PCA for pain control, and I think the IV Tylenol is also helping.  Sites look fine.  Drainage is the same serosanguinous fluid.    Objective: Vital signs in last 24 hours: Temp:  [97.5 F (36.4 C)-98 F (36.7 C)] 97.6 F (36.4 C) (11/15 0506) Pulse Rate:  [106-112] 109 (11/15 0506) Resp:  [11-18] 13 (11/15 0814) BP: (110-133)/(63-85) 121/85 (11/15 0506) SpO2:  [94 %-100 %] 99 % (11/15 0814) Last BM Date: (PTA) NPO 1942 IV 1675 urine 145 drain  Afebrile, VSS tachycardic/irregular No labs this AM  Intake/Output from previous day: 11/14 0701 - 11/15 0700 In: 1942.5 [I.V.:1742.5; IV Piggyback:200] Out: 1880 [OFBPZ:0258; Drains:145; Blood:60] Intake/Output this shift: No intake/output data recorded.  General appearance: alert, cooperative and no distress Resp: clear to auscultation bilaterally GI: soft, no BS, some burping, no flatus  Lab Results:  Recent Labs    11/22/16 0631 11/23/16 0614  WBC 14.7* 12.6*  HGB 11.4* 10.2*  HCT 36.0 32.6*  PLT 352 292    BMET Recent Labs    11/22/16 0631 11/23/16 0614  NA 139 141  K 3.4* 3.9  CL 108 112*  CO2 21* 21*  GLUCOSE 193* 84  BUN 14 12  CREATININE 0.82 0.86  CALCIUM 8.2* 7.3*   PT/INR Recent Labs    11/21/16 2056  LABPROT 11.8  INR 0.88    Recent Labs  Lab 11/22/16 0631  AST 73*  ALT 36  ALKPHOS 41  BILITOT 0.8  PROT 6.2*  ALBUMIN 3.5     Lipase     Component Value Date/Time   LIPASE 24 11/21/2016 2056     Medications: . gabapentin  300 mg Oral TID  . heparin  5,000 Units Subcutaneous Q8H  . HYDROmorphone   Intravenous Q4H  . mouth rinse  15 mL Mouth Rinse BID  . mirabegron ER  50 mg Oral Daily  . pantoprazole  40 mg Intravenous Q12H  . rOPINIRole  0.5 mg Oral TID   . sodium chloride 75 mL/hr at 11/23/16 2144  . piperacillin-tazobactam (ZOSYN)   IV 3.375 g (11/24/16 5277)  . Anti-infectives (From admission, onward)   Start     Dose/Rate Route Frequency Ordered Stop   11/22/16 0600  piperacillin-tazobactam (ZOSYN) IVPB 3.375 g    Comments:  Pharmacy may adjust dose as indicated   3.375 g 12.5 mL/hr over 240 Minutes Intravenous Every 8 hours 11/22/16 0010     11/21/16 2315  piperacillin-tazobactam (ZOSYN) IVPB 3.375 g     3.375 g 100 mL/hr over 30 Minutes Intravenous  Once 11/21/16 2301 11/22/16 0521    Pathology: Stomach, resection, Gastrojejunostomy and ulcer - SMALL BOWEL WITH ULCER. - TRANSMURAL DEFECTS. - SEROSITIS. - ADHERENT LIVER. - NO DYSPLASIA OR MALIGNANCY.  Assessment/Plan Perforated viscus/Hx of Roux-en Y  Large dehiscence of the anterior aspect of her gastrojejunostomy S/p Exploratory laparotomy,  Revision of gastrojejunostomy,  Esophagogastroscopy, 11/22/16, Dr. Romana Juniper Hypomagnesemia Hx of MI/PTVP  IBS/GI bleed Hx of RA Atrial fibrillation - not on anticoagulation on admit - Watchman device History of Raynaud's syndrome Restless leg syndrome Gout Peripheral neuropathy FEN: IV fluids/NPO ID:  Zosyn 11/12 =>> day 4 DVT:  Heparin Foley: in - will DC today Follow up:  Dr. Kae Heller   Plan:  Ambulate, dc foley, continue IV fluids/antibiotics for now.  H Pylori pending.Dr. Teryl Lucy reports Teaching Service Medicine will follow her from this point on.        LOS: 2 days    Manveer Gomes 11/24/2016 [phone]

## 2016-11-24 NOTE — Progress Notes (Signed)
Family Medicine Teaching Service Daily Progress Note Intern Pager: [phone]  Patient name: Deanna Spencer Medical record number: [id number] Date of birth: 1940-03-04 Age: 76 y.o. Gender: female  Primary Care Provider: Nuala Alpha, DO Consultants:  Code Status: Full  **WE ARE CONSULTS, GEN SURG IS PRIMARY**  Pt Overview and Major Events to Date:  Patient admitted to Gen surg for reversal of GJ.  Hx of Afib w/ watchman device not on anticoagulation given Hx of bleeds.  Cards is following.  Assessment and Plan: Patient admitted to Gen surg for reversal of GJ.  Hx of Afib not on anticoagulation given Hx of bleeds.  Cards is following.  AFIB w/ watchman device- (tachy in low 100s) Per Cards  Hx of bleeds- Hgb stable, 10.2 on 11/15  Chronic problems: Neuropathy- home nuerontin Overactive bladder- home myrbetriq Resltess leg- home requip  Emotional support -patient requested to speak with chaplain as she is concerned about her becomign a burden on family  FEN/GI: NPO per gen-surg, pantoprazole PPx: heparin  Disposition: per Gen Surg (primary on this patient).  Her chronic health issues beyond surgery and afib are controlled well on home meds.   Family teaching service will sign off.  If there are any specific questions, please contact us.  Subjective:  Patient appreciated checkin by her clinic team.   She is feeling drastically improved since surgery.  She is aware that general surgery is primary and that cardiology is managing her afib.     Objective: Temp:  [97.5 F (36.4 C)-98 F (36.7 C)] 97.6 F (36.4 C) (11/15 0506) Pulse Rate:  [106-112] 109 (11/15 0506) Resp:  [11-21] 18 (11/15 0506) BP: (110-133)/(63-85) 121/85 (11/15 0506) SpO2:  [94 %-100 %] 100 % (11/15 0506) Physical Exam: General: frail, in NAD, non-toxic appearing Cardiovascular: irregular but not tachycardic, no murmur noted on exam Respiratory: CTAB, no wheezes noted, no visible IWB Abdomen: minimal  pain well controlled by meds per surg, we did not remove dressings during exam Extremities: no gross deficits noted  Laboratory: Recent Labs  Lab 11/21/16 2056 11/22/16 0631 11/23/16 0614  WBC 12.3* 14.7* 12.6*  HGB 14.5 11.4* 10.2*  HCT 44.7 36.0 32.6*  PLT 447* 352 292   Recent Labs  Lab 11/21/16 2056 11/22/16 0631 11/23/16 0614  NA 139 139 141  K 3.6 3.4* 3.9  CL 107 108 112*  CO2 21* 21* 21*  BUN 19 14 12   CREATININE 0.97 0.82 0.86  CALCIUM 9.4 8.2* 7.3*  PROT  --  6.2*  --   BILITOT  --  0.8  --   ALKPHOS  --  41  --   ALT  --  36  --   AST  --  73*  --   GLUCOSE 117* 193* 84     Imaging/Diagnostic Tests: Dg Chest 2 View  Result Date: 11/21/2016 CLINICAL DATA:  76 year old female with history of atrial fibrillation. EXAM: CHEST  2 VIEW COMPARISON:  No priors. FINDINGS: Lung volumes are low. Mild elevation of the right hemidiaphragm. No acute consolidative airspace disease. No pleural effusions. No evidence of pulmonary edema. Heart size is mildly enlarged with left atrial dilatation. The patient is rotated to the right on today's exam, resulting in distortion of the mediastinal contours and reduced diagnostic sensitivity and specificity for mediastinal pathology. Aortic atherosclerosis. Left-sided pacemaker device in place with lead tips projecting over the expected location of the right atrial appendage and near the right ventricular apex. Status post bilateral shoulder arthroplasty. IMPRESSION:  1. Lung volumes with no radiographic evidence of acute cardiopulmonary disease. 2. Cardiomegaly with left atrial dilatation. 3. Aortic atherosclerosis. Electronically Signed   By: Vinnie Langton M.D.   On: 11/21/2016 21:47   Ct Abdomen Pelvis W Contrast  Result Date: 11/21/2016 CLINICAL DATA:  Abdominal pain for 2 days, atrial fibrillation. History of duodenum ulcer, diverticulosis, gastric bypass, cholecystectomy, appendectomy. EXAM: CT ABDOMEN AND PELVIS WITH CONTRAST  TECHNIQUE: Multidetector CT imaging of the abdomen and pelvis was performed using the standard protocol following bolus administration of intravenous contrast. CONTRAST:  129mL ISOVUE-300 IOPAMIDOL (ISOVUE-300) INJECTION 61% COMPARISON:  None. FINDINGS: LOWER CHEST: RIGHT lung base dependent atelectasis. Included heart size is moderately enlarged. No parapharyngeal effusion. Pacemaker in place. HEPATOBILIARY: Status post cholecystectomy. Moderate biliary dilatation may be post procedural. PANCREAS: Atrophic, nonacute. SPLEEN: Normal. ADRENALS/URINARY TRACT: Kidneys are orthotopic, demonstrating symmetric enhancement. Severe scarring bilateral kidneys with focal atrophy LEFT interpolar kidney. 15 mm RIGHT interpolar cyst. No nephrolithiasis, hydronephrosis or solid renal masses. The unopacified ureters are normal in course and caliber. Delayed imaging through the kidneys demonstrates symmetric prompt contrast excretion within the proximal urinary collecting system. Urinary bladder is partially distended and unremarkable. Normal adrenal glands. STOMACH/BOWEL: Assessment limited without oral contrast. Moderate hiatal hernia. Status post apparent cast dura jejunostomy. Focal 2.8 cm cyst gastric anastomosis. Extensive gastric luminal hyper re- media and inflammatory changes in the upper abdomen. Focal fluid and inflammation at proximal duodenum. 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 Severe colonic diverticulosis. VASCULAR/LYMPHATIC: 3.5 cm infrarenal aortic aneurysm. SMA and IMA not identified though not tailored for evaluation. No lymphadenopathy by CT size criteria. REPRODUCTIVE: Status post hysterectomy. OTHER: Moderate intraperitoneal free air predominately in the upper abdomen. Small amount of ascites. No drainable fluid collection. Small fat containing LEFT inguinal hernia. Small fat containing umbilical hernia. MUSCULOSKELETAL: Nonacute. Old LEFT superior and inferior pubic rami fractures. Sub chronic  deformities of bilateral iliac bone, possible donor site. Status post L3-4 PLIF. Broad levoscoliosis. Severe RIGHT, moderate LEFT hip osteoarthrosis. Asymmetric RIGHT pelvic muscle atrophy. RIGHT hip effusion. Old RIGHT rib fractures. Subcentimeter sclerotic focus LEFT fifth rib. IMPRESSION: 1. Pneumoperitoneum and extensive inflammatory changes in the upper abdomen consistent with stomach or proximal small bowel perforation. Status post gastrojejunostomy and suspected extensive revisions. No abscess. 2. Status post cholecystectomy with greater than expected though potentially postprocedural intrahepatic biliary dilatation. 3. **An incidental finding of potential clinical significance has been found. 3.5 cm infrarenal aortic aneurysm. Recommend followup by ultrasound in 2 years. This recommendation follows ACR consensus guidelines: White Paper of the ACR Incidental Findings Committee II on Vascular Findings. J Am Coll Radiol 2013; 10:789-794.** 4. Sclerotic lesion LEFT anterior fifth rib. If there is a history of cancer, consider bone scan. 5. Critical Value/emergent results were called by telephone at the time of interpretation on 11/21/2016 at 11:00 pm to Dr. Virgel Manifold , who verbally acknowledged these results. Electronically Signed   By: Elon Alas M.D.   On: 11/21/2016 23:04     Sherene Sires, DO 11/24/2016, 7:07 AM PGY-1, [facility] Intern pager: [phone], text pages welcome

## 2016-11-24 NOTE — Evaluation (Addendum)
Physical Therapy Evaluation Patient Details Name: Deanna Spencer MRN: [id number] DOB: 01-Jan-1941 Today's Date: 11/24/2016   History of Present Illness  76 y.o. female admitted on 11/21/16 for abdominal pain, surgery consulted with suspected peforated viscus and pt underwent exploratory laparotomy with post op JP drain.  Pt with significant PMH of several complex abdominal surgeries including, but not limited to gastirc bypass, multiple episodes of bleeding ulcers, laparascopic revision of gastrojejunostomy, MI, a-fib, L atrial appendage watchman (after going off coumadin), and is in need of a R hip replacement due to bad R hip OA, bil TSA, and R foot fusion.  Clinical Impression  Pt explaining her extensive PMH.  She has significant hip OA and only is using a cane at home, which is fine most of the time except when she gets up first thing in the morning and is very stiff.  I recommend she use a RW for this time until she feels more limber.  She would benefit from acute therapy during her hospitalization, but will likely not need f/u.  I would recommend OT consult and continued work with the mobility tech.   PT to follow acutely for deficits listed below.       Follow Up Recommendations No PT follow up    Equipment Recommendations  Rolling walker with 5" wheels    Recommendations for Other Services OT consult     Precautions / Restrictions Precautions Precautions: Fall      Mobility  Bed Mobility Overal bed mobility: Needs Assistance Bed Mobility: Supine to Sit;Sit to Supine     Supine to sit: Min guard Sit to supine: Min assist   General bed mobility comments: Min guard assist for transitions, pt log rolled to get to EOB, but needed cues to do it to get back into the bed   Transfers Overall transfer level: Needs assistance Equipment used: Rolling walker (2 wheeled) Transfers: Sit to/from Stand Sit to Stand: Min guard         General transfer comment: Min guard assist for  safety during transitions.   Ambulation/Gait Ambulation/Gait assistance: Min assist Ambulation Distance (Feet): 120 Feet Assistive device: Rolling walker (2 wheeled) Gait Pattern/deviations: Step-through pattern;Trunk flexed;Antalgic Gait velocity: decreased Gait velocity interpretation: Below normal speed for age/gender General Gait Details: Pt with slow, yet stedy gait with RW.  Pt with moderately antalgic gait pattern, but this is her baseline.  She was less antalgic as she continued with her walk.          Balance Overall balance assessment: Needs assistance Sitting-balance support: Feet supported;No upper extremity supported Sitting balance-Leahy Scale: Good     Standing balance support: Bilateral upper extremity supported Standing balance-Leahy Scale: Fair                               Pertinent Vitals/Pain Pain Assessment: Faces Faces Pain Scale: Hurts even more Pain Location: abdomen Pain Descriptors / Indicators: Sore Pain Intervention(s): Limited activity within patient's tolerance;Monitored during session;Repositioned;PCA encouraged    Home Living Family/patient expects to be discharged to:: Private residence(daughter's home) Living Arrangements: Children(daughter) Available Help at Discharge: Family Type of Home: House Home Access: Stairs to enter Entrance Stairs-Rails: None Entrance Stairs-Number of Steps: 1(curb step) Home Layout: One level(multil level, however, pt stays on the main level) Home Equipment: Cane - single point;Shower seat;Grab bars - toilet;Grab bars - tub/shower      Prior Function Level of Independence: Needs assistance  Gait / Transfers Assistance Needed: uses a cane for gait           Hand Dominance   Dominant Hand: Right    Extremity/Trunk Assessment   Upper Extremity Assessment Upper Extremity Assessment: Defer to OT evaluation    Lower Extremity Assessment Lower Extremity Assessment: RLE  deficits/detail RLE Deficits / Details: right leg with significant R hip OA and stiffness/pain, functionally she favors this leg during gait and if she is still too long it gets stiff.  She reports she has significant trouble getting up in the AM with just her cane.     Cervical / Trunk Assessment Cervical / Trunk Assessment: Kyphotic  Communication      Cognition Arousal/Alertness: Awake/alert Behavior During Therapy: WFL for tasks assessed/performed Overall Cognitive Status: Within Functional Limits for tasks assessed                                 General Comments: Not specifically tested, pt is very chatty and needs to be redirected to taks often, will walk and talk though.              Assessment/Plan    PT Assessment Patient needs continued PT services  PT Problem List Decreased strength;Decreased range of motion;Decreased activity tolerance;Decreased balance;Decreased mobility;Decreased knowledge of use of DME;Pain       PT Treatment Interventions DME instruction;Gait training;Stair training;Functional mobility training;Therapeutic activities;Therapeutic exercise;Balance training;Patient/family education    PT Goals (Current goals can be found in the Care Plan section)  Acute Rehab PT Goals Patient Stated Goal: to get healthy enough so that she can have her right hip replaced PT Goal Formulation: With patient Time For Goal Achievement: 12/08/16 Potential to Achieve Goals: Good    Frequency Min 3X/week           AM-PAC PT "6 Clicks" Daily Activity  Outcome Measure Difficulty turning over in bed (including adjusting bedclothes, sheets and blankets)?: A Little Difficulty moving from lying on back to sitting on the side of the bed? : Unable Difficulty sitting down on and standing up from a chair with arms (e.g., wheelchair, bedside commode, etc,.)?: Unable Help needed moving to and from a bed to chair (including a wheelchair)?: A Little Help needed  walking in hospital room?: A Little Help needed climbing 3-5 steps with a railing? : A Little 6 Click Score: 14    End of Session   Activity Tolerance: Patient limited by fatigue;Patient limited by pain Patient left: in bed;with call bell/phone within reach   PT Visit Diagnosis: Muscle weakness (generalized) (M62.81);Pain;Difficulty in walking, not elsewhere classified (R26.2) Pain - Right/Left: Right Pain - part of body: Hip    Time: 1660-6301 PT Time Calculation (min) (ACUTE ONLY): 27 min   Charges:           Wells Guiles B. Trinh Sanjose, PT, DPT [phone]   PT Evaluation $PT Eval Moderate Complexity: 1 Mod PT Treatments $Gait Training: 8-22 mins   11/24/2016, 6:24 PM

## 2016-11-24 NOTE — Progress Notes (Signed)
Patient is a family medicine teaching service patient. Will transition medical care to their team. Please contact them for medical questions pertaining to this patient. Thanks!  Cordelia Poche, MD [facility] 11/24/2016, 6:53 AM Pager: [phone]

## 2016-11-25 ENCOUNTER — Inpatient Hospital Stay (HOSPITAL_COMMUNITY): Payer: Medicare Other

## 2016-11-25 LAB — H. PYLORI ANTIBODY, IGG

## 2016-11-25 MED ORDER — IOPAMIDOL (ISOVUE-300) INJECTION 61%
INTRAVENOUS | Status: AC
  Filled 2016-11-25: qty 150

## 2016-11-25 MED ORDER — MORPHINE SULFATE (PF) 4 MG/ML IV SOLN
2.0000 mg | INTRAVENOUS | Status: DC | PRN
  Administered 2016-11-26 – 2016-11-30 (×3): 4 mg via INTRAVENOUS
  Filled 2016-11-25 (×3): qty 1

## 2016-11-25 MED ORDER — IOPAMIDOL (ISOVUE-300) INJECTION 61%: 150.0000 mL | Freq: Once | INTRAVENOUS | Status: DC | PRN

## 2016-11-25 MED ORDER — OXYCODONE HCL 5 MG/5ML PO SOLN
5.0000 mg | ORAL | Status: DC | PRN
  Administered 2016-11-26 (×4): 10 mg via ORAL
  Administered 2016-11-26: 5 mg via ORAL
  Administered 2016-11-27 – 2016-11-30 (×16): 10 mg via ORAL
  Filled 2016-11-25 (×20): qty 10
  Filled 2016-11-25: qty 5

## 2016-11-25 MED ORDER — SUCRALFATE 1 GM/10ML PO SUSP
1.0000 g | Freq: Four times a day (QID) | ORAL | Status: DC | PRN
  Administered 2016-11-26 – 2016-11-30 (×13): 1 g via ORAL
  Filled 2016-11-25 (×13): qty 10

## 2016-11-25 MED ORDER — ACETAMINOPHEN 160 MG/5ML PO SOLN
650.0000 mg | Freq: Four times a day (QID) | ORAL | Status: DC | PRN
  Administered 2016-11-25 (×2): 650 mg via ORAL
  Filled 2016-11-25 (×2): qty 20.3

## 2016-11-25 MED ORDER — SUCRALFATE 1 GM/10ML PO SUSP: 1.0000 g | Freq: Four times a day (QID) | ORAL | Status: DC

## 2016-11-25 MED ORDER — MAGNESIUM SULFATE 2 GM/50ML IV SOLN
2.0000 g | Freq: Once | INTRAVENOUS | Status: AC
  Administered 2016-11-25: 2 g via INTRAVENOUS
  Filled 2016-11-25: qty 50

## 2016-11-25 NOTE — Progress Notes (Signed)
Physical Therapy Treatment Patient Details Name: Deanna Spencer MRN: [id number] DOB: [birth year] Today's Date: 11/25/2016    History of Present Illness 76 y.o. female admitted on 11/21/16 for abdominal pain, surgery consulted with suspected peforated viscus and pt underwent exploratory laparotomy with post op JP drain.  Pt with significant PMH of several complex abdominal surgeries including, but not limited to gastirc bypass, multiple episodes of bleeding ulcers, laparascopic revision of gastrojejunostomy, MI, a-fib, L atrial appendage watchman (after going off coumadin), and is in need of a R hip replacement due to bad R hip OA, bil TSA, and R foot fusion.    PT Comments    Pt is progressing well with therapy, despite increased soreness today (Pt now off of PCA), she was able to increase her gait distance and her hip stiffness improved with increased distance.  We need to review proper bed mobility after abdominal surgery next session as she is rolling to get up, but not reverse log rolling to get back into bed.    Follow Up Recommendations  No PT follow up     Equipment Recommendations  Rolling walker with 5" wheels    Recommendations for Other Services OT consult     Precautions / Restrictions Precautions Precautions: Fall    Mobility  Bed Mobility   Bed Mobility: Sit to Supine       Sit to supine: Min assist   General bed mobility comments: Min assist to help get legs up into bed.   Transfers Overall transfer level: Needs assistance Equipment used: Rolling walker (2 wheeled) Transfers: Sit to/from Stand Sit to Stand: Min guard         General transfer comment: Min guard assist for safety  Ambulation/Gait Ambulation/Gait assistance: Min assist Ambulation Distance (Feet): 200 Feet Assistive device: Rolling walker (2 wheeled) Gait Pattern/deviations: Step-through pattern;Antalgic Gait velocity: decreased Gait velocity interpretation: Below normal speed for  age/gender General Gait Details: Pt with moderately antalgic gait pattern which improved, but did not fully dissipate with increased gait distance.            Balance Overall balance assessment: Needs assistance Sitting-balance support: Feet supported;No upper extremity supported Sitting balance-Leahy Scale: Good     Standing balance support: Bilateral upper extremity supported Standing balance-Leahy Scale: Fair                              Cognition Arousal/Alertness: Awake/alert Behavior During Therapy: WFL for tasks assessed/performed Overall Cognitive Status: Within Functional Limits for tasks assessed                                               Pertinent Vitals/Pain Pain Assessment: Faces Faces Pain Scale: Hurts whole lot Pain Location: hip more than abdomen today, no more PCA Pain Descriptors / Indicators: Sore Pain Intervention(s): Limited activity within patient's tolerance;Monitored during session;Repositioned;Patient requesting pain meds-RN notified           PT Goals (current goals can now be found in the care plan section) Acute Rehab PT Goals Patient Stated Goal: to get healthy enough so that she can have her right hip replaced Progress towards PT goals: Progressing toward goals    Frequency    Min 3X/week      PT Plan Current plan remains appropriate  AM-PAC PT "6 Clicks" Daily Activity  Outcome Measure  Difficulty turning over in bed (including adjusting bedclothes, sheets and blankets)?: A Little Difficulty moving from lying on back to sitting on the side of the bed? : Unable Difficulty sitting down on and standing up from a chair with arms (e.g., wheelchair, bedside commode, etc,.)?: Unable Help needed moving to and from a bed to chair (including a wheelchair)?: A Little Help needed walking in hospital room?: A Little Help needed climbing 3-5 steps with a railing? : A Little 6 Click Score: 14    End  of Session   Activity Tolerance: Patient limited by pain Patient left: in bed;with call bell/phone within reach   PT Visit Diagnosis: Muscle weakness (generalized) (M62.81);Pain;Difficulty in walking, not elsewhere classified (R26.2) Pain - Right/Left: Right Pain - part of body: Hip     Time: 1443-1500 PT Time Calculation (min) (ACUTE ONLY): 17 min  Charges:  $Gait Training: 8-22 mins          Reyn Faivre B. Fatma Rutten, [place], DPT [phone]            11/25/2016, 3:04 PM

## 2016-11-25 NOTE — Progress Notes (Signed)
CCMD called to let this RN know pts had 7 beats of VTach. Assessed pt, pt is asleep, resting comfortably. Denies any cp or sob. Will continue to monitor pt.

## 2016-11-25 NOTE — Progress Notes (Signed)
4 Days Post-Op    CC: Abdominal pain  Subjective: Complaining of headaches today, she wants a PICC line she says she is running out of IV sites.  Says she has not had any flatus.  She does have bowel sounds this a.m.  Drainage from her JP had about 90 mL this a.m.  I am not sure whether it is ileus or whether this is old blood.  Objective: Vital signs in last 24 hours: Temp:  [97.8 F (36.6 C)-98.4 F (36.9 C)] 98.3 F (36.8 C) (11/16 0513) Pulse Rate:  [85-128] 128 (11/16 0513) Resp:  [12-20] 12 (11/16 0822) BP: (106-124)/(57-73) 112/58 (11/16 0513) SpO2:  [96 %-100 %] 100 % (11/16 0822) Last BM Date: (PTA) 60 PO 2400 IV 925 urine Drains - 195 yesterday Tachycardic rate up to 124 Still on nasal O2  Intake/Output from previous day: 11/15 0701 - 11/16 0700 In: 2463.8 [P.O.:60; I.V.:1853.8; IV Piggyback:550] Out: 1120 [Urine:925; Drains:195] Intake/Output this shift: No intake/output data recorded.  General appearance: alert, cooperative and no distress Resp: clear to auscultation bilaterally GI: Soft, sore, using PCA frequently, midline incision looks good.  Few bowel sounds this a.m.  No flatus.  No BM.  Drainage is dark reddish brown color I am not sure whether it is bilious or just old blood.  What is in the tubing looks like old blood.  Lab Results:  Recent Labs    11/23/16 0614  WBC 12.6*  HGB 10.2*  HCT 32.6*  PLT 292    BMET Recent Labs    11/23/16 0614  NA 141  K 3.9  CL 112*  CO2 21*  GLUCOSE 84  BUN 12  CREATININE 0.86  CALCIUM 7.3*   PT/INR No results for input(s): LABPROT, INR in the last 72 hours.  Recent Labs  Lab 11/22/16 0631  AST 73*  ALT 36  ALKPHOS 41  BILITOT 0.8  PROT 6.2*  ALBUMIN 3.5     Lipase     Component Value Date/Time   LIPASE 24 11/21/2016 2056     Medications: . gabapentin  300 mg Oral TID  . heparin  5,000 Units Subcutaneous Q8H  . HYDROmorphone   Intravenous Q4H  . mouth rinse  15 mL Mouth Rinse BID   . mirabegron ER  50 mg Oral Daily  . pantoprazole  40 mg Intravenous Q12H  . rOPINIRole  0.5 mg Oral TID   . sodium chloride 75 mL/hr at 11/25/16 0344  . piperacillin-tazobactam (ZOSYN)  IV 3.375 g (11/25/16 0458)   Anti-infectives (From admission, onward)   Start     Dose/Rate Route Frequency Ordered Stop   11/22/16 0600  piperacillin-tazobactam (ZOSYN) IVPB 3.375 g    Comments:  Pharmacy may adjust dose as indicated   3.375 g 12.5 mL/hr over 240 Minutes Intravenous Every 8 hours 11/22/16 0010     11/21/16 2315  piperacillin-tazobactam (ZOSYN) IVPB 3.375 g     3.375 g 100 mL/hr over 30 Minutes Intravenous  Once 11/21/16 2301 11/22/16 0521      Assessment/Plan Perforated viscus/Hx of Roux-en Y  Large dehiscence of the anterior aspect of her gastrojejunostomy S/p Exploratory laparotomy,  Revision of gastrojejunostomy,  Esophagogastroscopy, 11/22/16, Dr. Romana Juniper UGI this AM - 11/25/16   Hypomagnesemia - replace  Medicine consults:  Hospitalist and DR. Bland TS have signed off/Deferred to cardiology Hx of MI/PTVP  IBS/GI bleed  Hx of RA History of Raynaud's syndrome Restless leg syndrome Gout Peripheral neuropathy Mag 1.5/ Ca 7.3 is  also low WBC improving down to 12.6  FEN: IV fluids/NPO ID:  Zosyn 11/12 =>> day 4 DVT:  Heparin Foley: out  Follow up:  Dr. Kae Heller  Plan: We will put a PICC line in her today.  Obtain upper GI with water-soluble contrast today.  She is not using her PCA a lot.  Consider discontinuing this later after we get the results of her upper GI..     LOS: 3 days    Dontrelle Mazon 11/25/2016 [phone]

## 2016-11-25 NOTE — Progress Notes (Signed)
Dilaudid pca discontinued by MD, 19mg  dilaudid wasted with Wallene Huh.

## 2016-11-25 NOTE — Evaluation (Addendum)
Occupational Therapy Evaluation Patient Details Name: Deanna Spencer MRN: [id number] DOB: July 24, 1940 Today's Date: 11/25/2016    History of Present Illness 76 y.o. female admitted on 11/21/16 for abdominal pain, surgery consulted with suspected peforated viscus and pt underwent exploratory laparotomy with post op JP drain.  Pt with significant PMH of several complex abdominal surgeries including, but not limited to gastirc bypass, multiple episodes of bleeding ulcers, laparascopic revision of gastrojejunostomy, MI, a-fib, L atrial appendage watchman (after going off coumadin), and is in need of a R hip replacement due to bad R hip OA, bil TSA, and R foot fusion.   Clinical Impression   This 76 y/o F presents with the above. At baseline Pt is mod independent with functional mobility, reports receiving assist from daughter for bathing/dressing ADLs. Pt completed brief functional mobility at RW level with MinGuard assist this session, currently requires MaxA for LB ADLs, mostly limited by pain in R hip and general fatigue this session. Pt will benefit from continued acute OT services to maximize Pt's safety and independence with ADLs and mobility prior to return home.     Follow Up Recommendations  No OT follow up;Supervision/Assistance - 24 hour    Equipment Recommendations  None recommended by OT           Precautions / Restrictions Precautions Precautions: Fall Restrictions Weight Bearing Restrictions: No      Mobility Bed Mobility Overal bed mobility: Needs Assistance Bed Mobility: Sit to Supine;Supine to Sit     Supine to sit: Min guard Sit to supine: Min assist   General bed mobility comments: Min assist to help get legs up into bed when returning to supine; Pt demosntrates log roll to sit EOB, educated on log roll technique for increased ease of task completion when returning to supine though Pt declining trialing  Transfers Overall transfer level: Needs  assistance Equipment used: Rolling walker (2 wheeled) Transfers: Sit to/from Stand Sit to Stand: Min guard         General transfer comment: Min guard assist for safety    Balance Overall balance assessment: Needs assistance Sitting-balance support: Feet supported;No upper extremity supported Sitting balance-Leahy Scale: Good     Standing balance support: Bilateral upper extremity supported Standing balance-Leahy Scale: Fair                             ADL either performed or assessed with clinical judgement   ADL Overall ADL's : Needs assistance/impaired Eating/Feeding: Set up;Sitting   Grooming: Set up;Sitting   Upper Body Bathing: Sitting;Min guard   Lower Body Bathing: Sit to/from stand;Moderate assistance   Upper Body Dressing : Min guard;Sitting   Lower Body Dressing: Maximal assistance;Sit to/from stand Lower Body Dressing Details (indicate cue type and reason): Pt reports previously able to don underwear without assist; requires total assist to don socks seated EOB this session due to increased pain in R hip  Toilet Transfer: Min guard;Stand-pivot;BSC;RW   Toileting- Clothing Manipulation and Hygiene: Minimal assistance;Sit to/from stand       Functional mobility during ADLs: Min guard;Rolling walker General ADL Comments: Pt with increased pain/fatigue this session, completed bed mobility to sit EOB during session, completed side steps along EOB at RW level with MinGuard prior to returning to supine                          Pertinent Vitals/Pain Pain Assessment: Faces Faces  Pain Scale: Hurts whole lot Pain Location: hip more than abdomen today, no more PCA Pain Descriptors / Indicators: Sore Pain Intervention(s): Limited activity within patient's tolerance;Monitored during session;Repositioned     Hand Dominance Right   Extremity/Trunk Assessment Upper Extremity Assessment Upper Extremity Assessment: LUE deficits/detail(Pt reports  having Raynaud's in both UEs ) LUE Deficits / Details: Pt requires assist with use of RUE to move LUE into shoulder flexion; able to maintain shoulder flexion without assist (residual due to previous shoulder surgeries) LUE Coordination: decreased fine motor;decreased gross motor   Lower Extremity Assessment Lower Extremity Assessment: Defer to PT evaluation   Cervical / Trunk Assessment Cervical / Trunk Assessment: Kyphotic   Communication     Cognition Arousal/Alertness: Awake/alert Behavior During Therapy: WFL for tasks assessed/performed Overall Cognitive Status: Within Functional Limits for tasks assessed                                                      Home Living Family/patient expects to be discharged to:: Private residence(daughter's home) Living Arrangements: Children(daughter ) Available Help at Discharge: Family Type of Home: House Home Access: Stairs to enter [employer] of Steps: 1(curb step ) Entrance Stairs-Rails: None Home Layout: One level(multi-level, however Pt lives on main level )     Bathroom Shower/Tub: Teacher, early years/pre: Standard     Home Equipment: Cane - single point;Shower seat;Grab bars - toilet;Grab bars - tub/shower          Prior Functioning/Environment Level of Independence: Needs assistance  Gait / Transfers Assistance Needed: uses a cane for gait ADL's / Homemaking Assistance Needed: Pt's daughter provides supervision/assist for bathing ADLs, assist for socks and shoes            OT Problem List: Pain;Decreased strength;Decreased activity tolerance      OT Treatment/Interventions: Self-care/ADL training;DME and/or AE instruction;Therapeutic activities;Balance training;Patient/family education;Energy conservation    OT Goals(Current goals can be found in the care plan section) Acute Rehab OT Goals Patient Stated Goal: to get healthy enough so that she can have her right hip  replaced OT Goal Formulation: With patient Time For Goal Achievement: 12/09/16 Potential to Achieve Goals: Good  OT Frequency: Min 2X/week                             AM-PAC PT "6 Clicks" Daily Activity     Outcome Measure Help from another person eating meals?: None Help from another person taking care of personal grooming?: A Little Help from another person toileting, which includes using toliet, bedpan, or urinal?: A Little Help from another person bathing (including washing, rinsing, drying)?: A Little Help from another person to put on and taking off regular upper body clothing?: A Little Help from another person to put on and taking off regular lower body clothing?: A Lot 6 Click Score: 18   End of Session Equipment Utilized During Treatment: Rolling walker Nurse Communication: Mobility status  Activity Tolerance: Patient limited by fatigue;Patient limited by pain Patient left: in bed;with call bell/phone within reach  OT Visit Diagnosis: Other abnormalities of gait and mobility (R26.89);Pain Pain - Right/Left: Right Pain - part of body: Hip                Time: 8527-7824 OT Time  Calculation (min): 24 min Charges:  OT General Charges $OT Visit: 1 Visit OT Evaluation $OT Eval Moderate Complexity: 1 Mod G-Codes:     Lou Cal, OT Pager [phone] 11/25/2016   Raymondo Band 11/25/2016, 3:51 PM

## 2016-11-26 NOTE — Plan of Care (Signed)
  Progressing Education: Knowledge of General Education information will improve 11/26/2016 0446 - Progressing by Anson Fret, RN Activity: Risk for activity intolerance will decrease 11/26/2016 0446 - Progressing by Anson Fret, RN Note Pt. ambulating with rolling walker with standby assist and tolerating well.

## 2016-11-26 NOTE — Progress Notes (Signed)
Spoke with pt re PICC line she had requested to be placed.  Risks and benefits reviewed.  Pt states has had them before during previous hospitalizations.  Notified no current labs ordered for her.  Has PIV that appears to be working well, WNL.  Is receiving IVF and one antibiotic. Pt states is ambulating, tolerating current diet, has had BM,and flatus. D/w pt at length.  Pt agrees with Arville Go RN and 2 IVT RN's that its okay to hold off on PICC line at this time.  Dr Ninfa Linden notified and approves of this decision.

## 2016-11-26 NOTE — Progress Notes (Signed)
Occupational Therapy Treatment Patient Details Name: Deanna Spencer MRN: [id number] DOB: December 02, 1940 Today's Date: 11/26/2016    History of present illness 76 y.o. female admitted on 11/21/16 for abdominal pain, surgery consulted with suspected peforated viscus and pt underwent exploratory laparotomy with post op JP drain.  Pt with significant PMH of several complex abdominal surgeries including, but not limited to gastirc bypass, multiple episodes of bleeding ulcers, laparascopic revision of gastrojejunostomy, MI, a-fib, L atrial appendage watchman (after going off coumadin), and is in need of a R hip replacement due to bad R hip OA, bil TSA, and R foot fusion.   OT comments  Pt. Remains limited with participation due to pain, more assistance required this day due to R hip pain.  Reviewed LB ADL compensatory techniques.  Would benefit from sock aide. Plan to review this a/e with pt. At next session  Follow Up Recommendations  No OT follow up;Supervision/Assistance - 24 hour    Equipment Recommendations  None recommended by OT    Recommendations for Other Services      Precautions / Restrictions Precautions Precautions: Fall Restrictions Weight Bearing Restrictions: No       Mobility Bed Mobility Overal bed mobility: Needs Assistance Bed Mobility: Sit to Supine       Sit to supine: Mod assist   General bed mobility comments: mod a to bring b les into bed  Transfers Overall transfer level: Needs assistance Equipment used: None Transfers: Squat Pivot Transfers     Squat pivot transfers: Mod assist          Balance                                           ADL either performed or assessed with clinical judgement   ADL Overall ADL's : Needs assistance/impaired                     Lower Body Dressing: Maximal assistance;Sit to/from stand Lower Body Dressing Details (indicate cue type and reason): states her dtr. helps her with her socks  and she has a "special" way she dons underwear and pants. discussed a/e as an option Armed forces technical officer: Stand-pivot;Moderate assistance Toilet Transfer Details (indicate cue type and reason): simulated recliner to eob, pt. required more assistance secondary to increased c/o r hip pain           General ADL Comments: increased pain and fatigue wanting to go back to bed, states she had sat up for over 2 hours     Vision       Perception     Praxis      Cognition Arousal/Alertness: Awake/alert Behavior During Therapy: Anxious;Agitated Overall Cognitive Status: Within Functional Limits for tasks assessed                                 General Comments: very particular about bed, tray, room set up        Exercises     Shoulder Instructions       General Comments      Pertinent Vitals/ Pain       Pain Assessment: 0-10 Pain Score: 10-Worst pain ever Pain Location: R hip Pain Descriptors / Indicators: Aching;Sore Pain Intervention(s): Limited activity within patient's tolerance;Monitored during session;Repositioned  Home Living  Prior Functioning/Environment              Frequency  Min 2X/week        Progress Toward Goals  OT Goals(current goals can now be found in the care plan section)  Progress towards OT goals: Progressing toward goals  ADL Goals Pt Will Perform Lower Body Bathing: with supervision;sit to/from stand(with or without AE) Pt Will Perform Lower Body Dressing: with supervision;sit to/from stand(with or without AE) Pt Will Transfer to Toilet: with supervision;ambulating;regular height toilet Pt Will Perform Toileting - Clothing Manipulation and hygiene: with supervision;sit to/from stand  Plan      Co-evaluation                 AM-PAC PT "6 Clicks" Daily Activity     Outcome Measure   Help from another person eating meals?: None Help from another person  taking care of personal grooming?: A Little Help from another person toileting, which includes using toliet, bedpan, or urinal?: A Little Help from another person bathing (including washing, rinsing, drying)?: A Little Help from another person to put on and taking off regular upper body clothing?: A Little Help from another person to put on and taking off regular lower body clothing?: A Lot 6 Click Score: 18    End of Session    OT Visit Diagnosis: Other abnormalities of gait and mobility (R26.89);Pain Pain - Right/Left: Right Pain - part of body: Hip   Activity Tolerance Patient limited by fatigue;Patient limited by pain   Patient Left in bed;with call bell/phone within reach   Nurse Communication          Time: 8453-6468 OT Time Calculation (min): 15 min  Charges: OT General Charges $OT Visit: 1 Visit OT Treatments $Self Care/Home Management : 8-22 mins  Janice Coffin, COTA/L 11/26/2016, 11:17 AM

## 2016-11-26 NOTE — Progress Notes (Signed)
5 Days Post-Op   Subjective/Chief Complaint: No complaints. Wants to know if she can have red jello and pepsi   Objective: Vital signs in last 24 hours: Temp:  [97.7 F (36.5 C)-98.7 F (37.1 C)] 98.7 F (37.1 C) (11/17 0620) Pulse Rate:  [80-125] 100 (11/17 0620) Resp:  [14-18] 18 (11/17 0620) BP: (108-125)/(64-90) 125/90 (11/17 0620) SpO2:  [96 %-98 %] 97 % (11/17 0620) Last BM Date: (PTA)  Intake/Output from previous day: 11/16 0701 - 11/17 0700 In: 220 [P.O.:220] Out: 890 [Urine:700; Drains:190] Intake/Output this shift: No intake/output data recorded.  General appearance: alert and cooperative Resp: clear to auscultation bilaterally Cardio: regular rate and rhythm GI: soft, mild tenderness  Lab Results:  No results for input(Spencer): WBC, HGB, HCT, PLT in the last 72 hours. BMET No results for input(Spencer): NA, K, CL, CO2, GLUCOSE, BUN, CREATININE, CALCIUM in the last 72 hours. PT/INR No results for input(Spencer): LABPROT, INR in the last 72 hours. ABG No results for input(Spencer): PHART, HCO3 in the last 72 hours.  Invalid input(Spencer): PCO2, PO2  Studies/Results: Dg Ugi W/water Sol Cm  Result Date: 11/25/2016 CLINICAL DATA:  History of Roux-en-Y gastric bypass with recurrent ulcers and recent perforation at the gastrojejunostomy site. EXAM: WATER SOLUBLE UPPER GI SERIES TECHNIQUE: Single-column upper GI series was performed using water soluble contrast. CONTRAST:  Water-soluble contrast material. COMPARISON:  CT scan 11/21/2016 FLUOROSCOPY TIME:  Fluoroscopy Time:  2 minutes and 24 seconds. Radiation Exposure Index (if provided by the fluoroscopic device): 71 mGy Number of Acquired Spot Images: 0 FINDINGS: Pre-procedure KUB shows mild gaseous small bowel distention. Patient was given sips of water soluble contrast material by mouth. Contrast passes readily into the gastric remnant and then readily through the gastrojejunostomy into the Roux limb. The gastrojejunostomy was evaluated with  the patient prone and in both oblique positions. There is no evidence for extravasation from the gastrojejunostomy site. Sluggish peristalsis is evident in the Roux limb. IMPRESSION: 1. Free flow of water soluble contrast into the gastric remnant and through the gastrojejunostomy into the Roux limb. No evidence for contrast extravasation at the gastrojejunostomy site to suggest leak. 2. Sluggish peristalsis noted in the Roux limb. Electronically Signed   By: Misty Stanley M.D.   On: 11/25/2016 12:10    Anti-infectives: Anti-infectives (From admission, onward)   Start     Dose/Rate Route Frequency Ordered Stop   11/22/16 0600  piperacillin-tazobactam (ZOSYN) IVPB 3.375 g    Comments:  Pharmacy may adjust dose as indicated   3.375 g 12.5 mL/hr over 240 Minutes Intravenous Every 8 hours 11/22/16 0010     11/21/16 2315  piperacillin-tazobactam (ZOSYN) IVPB 3.375 g     3.375 g 100 mL/hr over 30 Minutes Intravenous  Once 11/21/16 2301 11/22/16 0521      Assessment/Plan: Spencer/p Procedure(Spencer): EXPLORATORY LAPAROTOMY, REVISION OF  GASTROJEJUNOSTOMY, ESOPHAGOGASTROSCOPY (N/A) Advance diet. Start fulls today Continue drain No leak on study yesterday ambulate  LOS: 4 days    Deanna Spencer,Deanna Spencer 11/26/2016

## 2016-11-27 LAB — BASIC METABOLIC PANEL
ANION GAP: 6 (ref 5–15)
BUN: <5 mg/dL — ABNORMAL LOW (ref 6–20)
CALCIUM: 8.2 mg/dL — AB (ref 8.9–10.3)
CHLORIDE: 114 mmol/L — AB (ref 101–111)
CO2: 20 mmol/L — AB (ref 22–32)
Creatinine, Ser: 0.68 mg/dL (ref 0.44–1.00)
GFR calc Af Amer: >60 mL/min (ref >60)
GFR calc non Af Amer: >60 mL/min (ref >60)
GLUCOSE: 113 mg/dL — AB (ref 65–99)
Potassium: 2.7 mmol/L — CL (ref 3.5–5.1)
Sodium: 140 mmol/L (ref 135–145)

## 2016-11-27 LAB — CBC WITH DIFFERENTIAL/PLATELET
Basophils Absolute: 0 10*3/uL (ref 0.0–0.1)
Basophils Relative: 0 %
Eosinophils Absolute: 0.4 10*3/uL (ref 0.0–0.7)
Eosinophils Relative: 5 %
HEMATOCRIT: 30.6 % — AB (ref 36.0–46.0)
HEMOGLOBIN: 9.4 g/dL — AB (ref 12.0–15.0)
LYMPHS PCT: 18 %
Lymphs Abs: 1.3 10*3/uL (ref 0.7–4.0)
MCH: 29.3 pg (ref 26.0–34.0)
MCHC: 30.7 g/dL (ref 30.0–36.0)
MCV: 95.3 fL (ref 78.0–100.0)
MONO ABS: 0.9 10*3/uL (ref 0.1–1.0)
MONOS PCT: 12 %
NEUTROS ABS: 4.8 10*3/uL (ref 1.7–7.7)
Neutrophils Relative %: 65 %
Platelets: 331 10*3/uL (ref 150–400)
RBC: 3.21 MIL/uL — ABNORMAL LOW (ref 3.87–5.11)
RDW: 18.5 % — AB (ref 11.5–15.5)
WBC: 7.3 10*3/uL (ref 4.0–10.5)

## 2016-11-27 MED ORDER — POTASSIUM CHLORIDE CRYS ER 20 MEQ PO TBCR
20.0000 meq | EXTENDED_RELEASE_TABLET | ORAL | Status: DC | PRN
  Administered 2016-11-28: 20 meq via ORAL
  Filled 2016-11-27: qty 1

## 2016-11-27 MED ORDER — POTASSIUM CHLORIDE 10 MEQ/100ML IV SOLN
INTRAVENOUS | Status: AC
  Filled 2016-11-27: qty 100

## 2016-11-27 MED ORDER — POTASSIUM CHLORIDE 10 MEQ/100ML IV SOLN
INTRAVENOUS | Status: AC
  Administered 2016-11-27: 10 meq via INTRAVENOUS
  Filled 2016-11-27: qty 100

## 2016-11-27 MED ORDER — POTASSIUM CHLORIDE 10 MEQ/100ML IV SOLN
10.0000 meq | INTRAVENOUS | Status: AC
  Administered 2016-11-27 (×5): 10 meq via INTRAVENOUS
  Filled 2016-11-27 (×10): qty 100

## 2016-11-27 NOTE — Progress Notes (Signed)
6 Days Post-Op   Subjective/Chief Complaint: Still complains of epigastric pain. UGI showed no leak   Objective: Vital signs in last 24 hours: Temp:  [98.2 F (36.8 C)-98.6 F (37 C)] 98.2 F (36.8 C) (11/18 0552) Pulse Rate:  [102-127] 102 (11/18 0552) Resp:  [18-21] 21 (11/18 0552) BP: (121-164)/(85-105) 164/85 (11/18 0552) SpO2:  [98 %-99 %] 98 % (11/18 0552) Last BM Date: 11/26/16  Intake/Output from previous day: 11/17 0701 - 11/18 0700 In: 1512.5 [P.O.:300; I.V.:1112.5; IV Piggyback:100] Out: 9470 [Urine:1450; Drains:45] Intake/Output this shift: No intake/output data recorded.  General appearance: alert and cooperative Resp: clear to auscultation bilaterally Cardio: regular rate and rhythm GI: soft, moderate focal epigastric pain  Lab Results:  No results for input(s): WBC, HGB, HCT, PLT in the last 72 hours. BMET No results for input(s): NA, K, CL, CO2, GLUCOSE, BUN, CREATININE, CALCIUM in the last 72 hours. PT/INR No results for input(s): LABPROT, INR in the last 72 hours. ABG No results for input(s): PHART, HCO3 in the last 72 hours.  Invalid input(s): PCO2, PO2  Studies/Results: Dg Ugi W/water Sol Cm  Result Date: 11/25/2016 CLINICAL DATA:  History of Roux-en-Y gastric bypass with recurrent ulcers and recent perforation at the gastrojejunostomy site. EXAM: WATER SOLUBLE UPPER GI SERIES TECHNIQUE: Single-column upper GI series was performed using water soluble contrast. CONTRAST:  Water-soluble contrast material. COMPARISON:  CT scan 11/21/2016 FLUOROSCOPY TIME:  Fluoroscopy Time:  2 minutes and 24 seconds. Radiation Exposure Index (if provided by the fluoroscopic device): 71 mGy Number of Acquired Spot Images: 0 FINDINGS: Pre-procedure KUB shows mild gaseous small bowel distention. Patient was given sips of water soluble contrast material by mouth. Contrast passes readily into the gastric remnant and then readily through the gastrojejunostomy into the Roux  limb. The gastrojejunostomy was evaluated with the patient prone and in both oblique positions. There is no evidence for extravasation from the gastrojejunostomy site. Sluggish peristalsis is evident in the Roux limb. IMPRESSION: 1. Free flow of water soluble contrast into the gastric remnant and through the gastrojejunostomy into the Roux limb. No evidence for contrast extravasation at the gastrojejunostomy site to suggest leak. 2. Sluggish peristalsis noted in the Roux limb. Electronically Signed   By: Misty Stanley M.D.   On: 11/25/2016 12:10    Anti-infectives: Anti-infectives (From admission, onward)   Start     Dose/Rate Route Frequency Ordered Stop   11/22/16 0600  piperacillin-tazobactam (ZOSYN) IVPB 3.375 g    Comments:  Pharmacy may adjust dose as indicated   3.375 g 12.5 mL/hr over 240 Minutes Intravenous Every 8 hours 11/22/16 0010     11/21/16 2315  piperacillin-tazobactam (ZOSYN) IVPB 3.375 g     3.375 g 100 mL/hr over 30 Minutes Intravenous  Once 11/21/16 2301 11/22/16 0521      Assessment/Plan: s/p Procedure(s): EXPLORATORY LAPAROTOMY, REVISION OF  GASTROJEJUNOSTOMY, ESOPHAGOGASTROSCOPY (N/A) Advance diet. Start soft food today Ambulate Continue zosyn. Check wbc and lytes today  LOS: 5 days    TOTH III,PAUL S 11/27/2016

## 2016-11-27 NOTE — Progress Notes (Signed)
CRITICAL VALUE ALERT  Critical Value:  Potassium of 2.7  Date & Time Notied: 11/27/16 11:47  Provider Notified: Brigid Re, PA  Orders Received/Actions taken: Patient to receive 6 doses of IV Potassium.

## 2016-11-28 ENCOUNTER — Encounter (HOSPITAL_COMMUNITY): Payer: Self-pay | Admitting: Cardiology

## 2016-11-28 LAB — BASIC METABOLIC PANEL
Anion gap: 4 — ABNORMAL LOW (ref 5–15)
CALCIUM: 8 mg/dL — AB (ref 8.9–10.3)
CO2: 22 mmol/L (ref 22–32)
CREATININE: 0.66 mg/dL (ref 0.44–1.00)
Chloride: 116 mmol/L — ABNORMAL HIGH (ref 101–111)
GFR calc Af Amer: >60 mL/min (ref >60)
GLUCOSE: 91 mg/dL (ref 65–99)
Potassium: 3.4 mmol/L — ABNORMAL LOW (ref 3.5–5.1)
Sodium: 142 mmol/L (ref 135–145)

## 2016-11-28 LAB — MAGNESIUM: Magnesium: 1.6 mg/dL — ABNORMAL LOW (ref 1.7–2.4)

## 2016-11-28 MED ORDER — ACETAMINOPHEN 500 MG PO TABS: 1000.0000 mg | ORAL_TABLET | Freq: Four times a day (QID) | ORAL | Status: DC | PRN

## 2016-11-28 MED ORDER — MAGNESIUM SULFATE 50 % IJ SOLN
3.0000 g | Freq: Once | INTRAVENOUS | Status: AC
  Administered 2016-11-28: 3 g via INTRAVENOUS
  Filled 2016-11-28: qty 6

## 2016-11-28 MED ORDER — PREMIER PROTEIN SHAKE
11.0000 [oz_av] | Freq: Two times a day (BID) | ORAL | Status: DC
  Administered 2016-11-29 – 2016-11-30 (×2): 11 [oz_av] via ORAL
  Filled 2016-11-28 (×6): qty 325.31

## 2016-11-28 MED ORDER — METOPROLOL TARTRATE 5 MG/5ML IV SOLN
2.5000 mg | Freq: Four times a day (QID) | INTRAVENOUS | Status: DC | PRN
  Administered 2016-11-28: 2.5 mg via INTRAVENOUS
  Filled 2016-11-28: qty 5

## 2016-11-28 MED ORDER — COLCHICINE 0.6 MG PO TABS: 0.6000 mg | ORAL_TABLET | Freq: Two times a day (BID) | ORAL | Status: DC | PRN

## 2016-11-28 MED ORDER — METOPROLOL TARTRATE 25 MG PO TABS
25.0000 mg | ORAL_TABLET | Freq: Two times a day (BID) | ORAL | Status: DC
  Administered 2016-11-28: 25 mg via ORAL
  Filled 2016-11-28: qty 1

## 2016-11-28 MED ORDER — PANTOPRAZOLE SODIUM 40 MG PO TBEC
40.0000 mg | DELAYED_RELEASE_TABLET | Freq: Two times a day (BID) | ORAL | Status: DC
  Administered 2016-11-28 – 2016-11-30 (×5): 40 mg via ORAL
  Filled 2016-11-28 (×5): qty 1

## 2016-11-28 MED ORDER — POTASSIUM CHLORIDE CRYS ER 20 MEQ PO TBCR
20.0000 meq | EXTENDED_RELEASE_TABLET | Freq: Three times a day (TID) | ORAL | Status: DC
  Administered 2016-11-28 (×3): 20 meq via ORAL
  Filled 2016-11-28 (×3): qty 1

## 2016-11-28 NOTE — Progress Notes (Signed)
Physical Therapy Treatment Patient Details Name: Deanna Spencer MRN: [id number] DOB: [birth year] Today's Date: 11/28/2016    History of Present Illness 76 y.o. female admitted on 11/21/16 for abdominal pain, surgery consulted with suspected peforated viscus and pt underwent exploratory laparotomy with post op JP drain.  Pt with significant PMH of several complex abdominal surgeries including, but not limited to gastirc bypass, multiple episodes of bleeding ulcers, laparascopic revision of gastrojejunostomy, MI, a-fib, L atrial appendage watchman (after going off coumadin), and is in need of a R hip replacement due to bad R hip OA, bil TSA, and R foot fusion.  Post op course complicated by A-fib with RVR    PT Comments    Pt is limited by R hip pain, fatigue and HR ranging from 110-148 during mobility.  Pt does feel her heart racing.  She is very worried and voices frustration re: multiple medical issues.  Pt continues to benefit from acute therapy.     Follow Up Recommendations  No PT follow up     Equipment Recommendations  Rolling walker with 5" wheels    Recommendations for Other Services   NA     Precautions / Restrictions Precautions Precautions: Fall    Mobility  Bed Mobility Overal bed mobility: Needs Assistance Bed Mobility: Supine to Sit;Sit to Sidelying     Supine to sit: Supervision   Sit to sidelying: Min assist General bed mobility comments: Supervision to get EOB with HOB mildly elevated and pt using bed rail for leverage to get partially side lying.  Assist needed to lift feet back up into bed.   Transfers Overall transfer level: Needs assistance Equipment used: Rolling walker (2 wheeled) Transfers: Sit to/from Stand Sit to Stand: Min guard;Min assist         General transfer comment: Min guard assist from higher bed, min assist from lower toilet due to right hip pain and fatigue.   Ambulation/Gait Ambulation/Gait assistance: Min guard Ambulation  Distance (Feet): 200 Feet Assistive device: Rolling walker (2 wheeled) Gait Pattern/deviations: Step-through pattern;Antalgic Gait velocity: decreased Gait velocity interpretation: Below normal speed for age/gender General Gait Details: Moderately analgic gait pattern favoring her R hip, audible crepitus today.  Pt's HR got up to 148 during gait with pt reporting racing symptoms.  At rest she is in the 110s.            Balance Overall balance assessment: Needs assistance Sitting-balance support: Feet supported;No upper extremity supported Sitting balance-Leahy Scale: Good     Standing balance support: No upper extremity supported;Single extremity supported;Bilateral upper extremity supported Standing balance-Leahy Scale: Fair                              Cognition Arousal/Alertness: Awake/alert Behavior During Therapy: WFL for tasks assessed/performed Overall Cognitive Status: Within Functional Limits for tasks assessed                                               Pertinent Vitals/Pain Pain Assessment: Faces Faces Pain Scale: Hurts even more Pain Location: right hip Pain Descriptors / Indicators: Sore Pain Intervention(s): Limited activity within patient's tolerance;Monitored during session;Repositioned           PT Goals (current goals can now be found in the care plan section) Acute Rehab PT Goals Patient Stated Goal:  to have nothing else go wrong.  Progress towards PT goals: Progressing toward goals    Frequency    Min 3X/week      PT Plan Current plan remains appropriate       AM-PAC PT "6 Clicks" Daily Activity  Outcome Measure  Difficulty turning over in bed (including adjusting bedclothes, sheets and blankets)?: A Little Difficulty moving from lying on back to sitting on the side of the bed? : Unable Difficulty sitting down on and standing up from a chair with arms (e.g., wheelchair, bedside commode, etc,.)?:  Unable Help needed moving to and from a bed to chair (including a wheelchair)?: A Little Help needed walking in hospital room?: A Little Help needed climbing 3-5 steps with a railing? : A Little 6 Click Score: 14    End of Session Equipment Utilized During Treatment: Gait belt Activity Tolerance: Patient limited by pain;Patient limited by fatigue Patient left: in bed;with call bell/phone within reach   PT Visit Diagnosis: Muscle weakness (generalized) (M62.81);Pain;Difficulty in walking, not elsewhere classified (R26.2) Pain - Right/Left: Right Pain - part of body: Hip     Time: 1724-1804(pt very chatty, did not charge one unit due to talking) PT Time Calculation (min) (ACUTE ONLY): 40 min  Charges:  $Gait Training: 23-37 mins          Ehtan Delfavero B. Zymarion Favorite, PT, DPT [phone]           11/28/2016, 6:04 PM

## 2016-11-28 NOTE — Progress Notes (Signed)
Occupational Therapy Treatment Patient Details Name: RYAN PALERMO MRN: [id number] DOB: 12-29-1940 Today's Date: 11/28/2016    History of present illness 76 y.o. female admitted on 11/21/16 for abdominal pain, surgery consulted with suspected peforated viscus and pt underwent exploratory laparotomy with post op JP drain.  Pt with significant PMH of several complex abdominal surgeries including, but not limited to gastirc bypass, multiple episodes of bleeding ulcers, laparascopic revision of gastrojejunostomy, MI, a-fib, L atrial appendage watchman (after going off coumadin), and is in need of a R hip replacement due to bad R hip OA, bil TSA, and R foot fusion.   OT comments  Focus of session on educating pt in use of AE for LB ADL. Pt continues to have soreness in her abdomen requiring use of AE.   Follow Up Recommendations  No OT follow up;Supervision/Assistance - 24 hour    Equipment Recommendations  None recommended by OT    Recommendations for Other Services      Precautions / Restrictions Precautions Precautions: Fall       Mobility Bed Mobility               General bed mobility comments: pt in chair  Transfers                      Balance     Sitting balance-Leahy Scale: Good                                     ADL either performed or assessed with clinical judgement   ADL Overall ADL's : Needs assistance/impaired             Lower Body Bathing: Minimal assistance;Sit to/from stand Lower Body Bathing Details (indicate cue type and reason): educated in use of long handled bath sponge and reacher     Lower Body Dressing: Supervision/safety;Sit to/from stand;With adaptive equipment Lower Body Dressing Details (indicate cue type and reason): educated in use of sock aid and reacher               General ADL Comments: instructed in multiple household uses of reacher in addition to LB ADL.     Vision       Perception      Praxis      Cognition Arousal/Alertness: Awake/alert Behavior During Therapy: WFL for tasks assessed/performed Overall Cognitive Status: Within Functional Limits for tasks assessed                                          Exercises     Shoulder Instructions       General Comments      Pertinent Vitals/ Pain       Pain Assessment: Faces Faces Pain Scale: Hurts little more Pain Location: abdomen Pain Descriptors / Indicators: Sore Pain Intervention(s): Monitored during session  Home Living                                          Prior Functioning/Environment              Frequency  Min 2X/week        Progress Toward Goals  OT Goals(current goals can now be  found in the care plan section)  Progress towards OT goals: Progressing toward goals  Acute Rehab OT Goals Patient Stated Goal: go home tomorrow Time For Goal Achievement: 12/09/16 Potential to Achieve Goals: Good  Plan Discharge plan remains appropriate    Co-evaluation                 AM-PAC PT "6 Clicks" Daily Activity     Outcome Measure   Help from another person eating meals?: None Help from another person taking care of personal grooming?: A Little Help from another person toileting, which includes using toliet, bedpan, or urinal?: A Little Help from another person bathing (including washing, rinsing, drying)?: A Little Help from another person to put on and taking off regular upper body clothing?: None Help from another person to put on and taking off regular lower body clothing?: A Little 6 Click Score: 20    End of Session    OT Visit Diagnosis: Other abnormalities of gait and mobility (R26.89);Pain   Activity Tolerance Patient limited by pain   Patient Left in chair;with call bell/phone within reach;with nursing/sitter in room   Nurse Communication          Time: [phone] OT Time Calculation (min): 26 min  Charges: OT General  Charges $OT Visit: 1 Visit OT Treatments $Self Care/Home Management : 23-37 mins  11/28/2016 Nestor Lewandowsky, OTR/L Pager: [phone]   Werner Lean Haze Boyden 11/28/2016, 10:01 AM

## 2016-11-28 NOTE — Care Management Important Message (Signed)
Important Message  Patient Details  Name: Deanna Spencer MRN: [id number] Date of Birth: August 07, 1940   Medicare Important Message Given:  Yes    [name] 11/28/2016, 12:56 PM

## 2016-11-28 NOTE — Progress Notes (Signed)
7 Days Post-Op    CC:  Abdominal pain  Subjective: She looks good, walking bent over with the walker, complaining of pain on the left side, but I think it is just post op soreness.  She is very talkative.  Her HR keeps going up and she is not on a BB.    Objective: Vital signs in last 24 hours: Temp:  [97.7 F (36.5 C)-98.2 F (36.8 C)] 97.7 F (36.5 C) (11/19 0505) Pulse Rate:  [113-121] 113 (11/19 0505) Resp:  [20-21] 20 (11/19 0505) BP: (109-135)/(75-91) 135/91 (11/19 0505) SpO2:  [98 %-99 %] 99 % (11/19 0505) Last BM Date: 11/27/16 240 PO 300 urine recorded BM x 4 Afebrile, Still tachycardic BP OK K+ 3.4, mag 1.6   Intake/Output from previous day: 11/18 0701 - 11/19 0700 In: 240 [P.O.:240] Out: 320 [Urine:300; Drains:20] Intake/Output this shift: No intake/output data recorded.  General appearance: alert, cooperative and no distress Resp: clear to auscultation bilaterally GI: soft, sore, sites look fine, midline incision looks fine.  Drain is clear serous fluid, tolerating PO's well  Lab Results:  Recent Labs    11/27/16 0952  WBC 7.3  HGB 9.4*  HCT 30.6*  PLT 331    BMET Recent Labs    11/27/16 0952 11/28/16 0448  NA 140 142  K 2.7* 3.4*  CL 114* 116*  CO2 20* 22  GLUCOSE 113* 91  BUN <5* <5*  CREATININE 0.68 0.66  CALCIUM 8.2* 8.0*   PT/INR No results for input(s): LABPROT, INR in the last 72 hours.  Recent Labs  Lab 11/22/16 0631  AST 73*  ALT 36  ALKPHOS 41  BILITOT 0.8  PROT 6.2*  ALBUMIN 3.5     Lipase     Component Value Date/Time   LIPASE 24 11/21/2016 2056     Medications: . gabapentin  300 mg Oral TID  . heparin  5,000 Units Subcutaneous Q8H  . mouth rinse  15 mL Mouth Rinse BID  . mirabegron ER  50 mg Oral Daily  . pantoprazole  40 mg Intravenous Q12H  . rOPINIRole  0.5 mg Oral TID   Anti-infectives (From admission, onward)   Start     Dose/Rate Route Frequency Ordered Stop   11/22/16 0600   piperacillin-tazobactam (ZOSYN) IVPB 3.375 g    Comments:  Pharmacy may adjust dose as indicated   3.375 g 12.5 mL/hr over 240 Minutes Intravenous Every 8 hours 11/22/16 0010     11/21/16 2315  piperacillin-tazobactam (ZOSYN) IVPB 3.375 g     3.375 g 100 mL/hr over 30 Minutes Intravenous  Once 11/21/16 2301 11/22/16 0521     . sodium chloride 10 mL/hr at 11/27/16 0901  . piperacillin-tazobactam (ZOSYN)  IV 3.375 g (11/28/16 0531)   Assessment/Plan Perforated viscus/Hx of Roux-en Y  Large dehiscence of the anterior aspect of her gastrojejunostomy S/p Exploratory laparotomy,  Revision of gastrojejunostomy,  Esophagogastroscopy, 11/22/16, Dr. Romana Juniper  Hypomagnesemia/hypokalemia - replace K+ and Mag+  Active issues: Hx of MI/PTVP  IBS/GI bleed Hx of RA Atrial fibrillation - not on anticoagulation on admit - Watchman device History of Raynaud's syndrome Restless leg syndrome Gout Peripheral neuropathy/bilateral hip pain  FEN: IV fluids/soft diet ID:  Zosyn 11/12 =>> day 7 will DC after today's RX DVT:  Heparin Foley: out Follow up:  Dr. Kae Heller  Plan:  I have left her some Lopressor for her Tachycardia with parameters, and ask Cardiology to see again about her rate.  She cannot have ASA  after this surgery.  Finish up her Zosyn today, continue to replace Mag and K+. Aim for DC soon.  I have ordered home Health to help with ambulation and will her her a walker for home.   I have personally reviewed the patients medication history on the [place] controlled substance database.      LOS: 6 days    Deanna Spencer 11/28/2016 [phone]

## 2016-11-28 NOTE — Progress Notes (Signed)
Progress Note  Patient Name: Deanna Spencer Date of Encounter: 11/28/2016  Primary Cardiologist: new Dr. Tamala Julian --Dr. Conley Deanna Spencer in [place]   No chest pain but increased SOB, increased HR.  Inpatient Medications    Scheduled Meds: . gabapentin  300 mg Oral TID  . heparin  5,000 Units Subcutaneous Q8H  . mouth rinse  15 mL Mouth Rinse BID  . mirabegron ER  50 mg Oral Daily  . pantoprazole  40 mg Oral BID  . potassium chloride  20 mEq Oral TID  . rOPINIRole  0.5 mg Oral TID   Continuous Infusions: . piperacillin-tazobactam (ZOSYN)  IV 3.375 g (11/28/16 1348)   PRN Meds: acetaminophen, colchicine, diphenhydrAMINE, hydrALAZINE, iopamidol, metoprolol tartrate, morphine injection, ondansetron **OR** ondansetron (ZOFRAN) IV, oxyCODONE, sucralfate   Vital Signs    Vitals:   11/27/16 1439 11/27/16 2016 11/28/16 0505 11/28/16 0854  BP: 120/75 109/89 (!) 135/91 128/83  Pulse: (!) 114 (!) 121 (!) 113 92  Resp: (!) 21 20 20    Temp: 98.2 F (36.8 C) 98 F (36.7 C) 97.7 F (36.5 C)   TempSrc: Oral Oral Oral   SpO2: 98% 99% 99% 99%  Weight:      Height:        Intake/Output Summary (Last 24 hours) at 11/28/2016 1612 Last data filed at 11/28/2016 1032 Gross per 24 hour  Intake 460 ml  Output 540 ml  Net -80 ml   Filed Weights   11/21/16 2056  Weight: 115 lb (52.2 kg)    Telemetry    A fib with RVR  - Personally Reviewed  ECG    No new , a fib with RBBB - Personally Reviewed  Physical Exam   GEN: No acute distress.  Though increased SOB Neck: No JVD Cardiac: irreg irreg, no murmurs, rubs, or gallops.  Respiratory: Clear to auscultation bilaterally. Few crackles in bases GI: Soft, nontender, non-distended dressing dry and intact MS: No edema; No deformity. Neuro:  Nonfocal  Psych: Normal affect   Labs    Chemistry Recent Labs  Lab 11/22/16 0631 11/23/16 0614 11/27/16 0952 11/28/16 0448  NA 139 141 140 142  K 3.4* 3.9 2.7* 3.4*  CL 108  112* 114* 116*  CO2 21* 21* 20* 22  GLUCOSE 193* 84 113* 91  BUN 14 12 <5* <5*  CREATININE 0.82 0.86 0.68 0.66  CALCIUM 8.2* 7.3* 8.2* 8.0*  PROT 6.2*  --   --   --   ALBUMIN 3.5  --   --   --   AST 73*  --   --   --   ALT 36  --   --   --   ALKPHOS 41  --   --   --   BILITOT 0.8  --   --   --   GFRNONAA >60 >60 >60 >60  GFRAA >60 >60 >60 >60  ANIONGAP 10 8 6  4*     Hematology Recent Labs  Lab 11/22/16 0631 11/23/16 0614 11/27/16 0952  WBC 14.7* 12.6* 7.3  RBC 3.79* 3.34* 3.21*  HGB 11.4* 10.2* 9.4*  HCT 36.0 32.6* 30.6*  MCV 95.0 97.6 95.3  MCH 30.1 30.5 29.3  MCHC 31.7 31.3 30.7  RDW 19.2* 20.2* 18.5*  PLT 352 292 331    Cardiac EnzymesNo results for input(s): TROPONINI in the last 168 hours.  Recent Labs  Lab 11/21/16 2108  TROPIPOC 0.00     BNPNo results for input(s): BNP, PROBNP in  the last 168 hours.   DDimer No results for input(s): DDIMER in the last 168 hours.   Radiology    No results found.  Cardiac Studies   none  Patient Profile     76 y.o. female with past medical history of hypertension, GERD, anemia, RA, chronic atrial fibrillation s/p watchman device, GI bleed with previous gastric bypass surgery now being seen for a fib with admit now for abd pain with bowel perf.  Exp. Lat and GJ repair. .     Assessment & Plan    Chronic a fib/PPM/ watchman device --has been unable to tolerate anticoagulation for significant bleeding issues.  On Admit has been on ASA alone.  This was held for surgery.  Dr. Tamala Julian felt that Plavix would be medication to begin when pt is stable.  --rate is up at times to 170s was not on rate slowing med prior to admit "my HR has been slow, that is why I had PPM"  BP is mostly stable.  Add BB vs. dilt - she is hopefully per pt being discharged tomorrow.  Depending on HR.Marland Kitchen Her HR has not been this elevated since admit. Afebrile,  --pt now lives here moved from [place]. --needs echo at some point, preferably with slower HR.    Anemia with hx of GI bleed, ASA stopped. Hgb is 9.4 today down from 11.4 on admit.  Hgb yest 9.4   ABD pain with bowel perforation s/p exp lap and GJ repair.   GERD - no complaints  Hx PPM--medtronic  HX MI per note on ASA for about 18 months.   We stopped, ? Add plavix now or as outpt.  Hypokalemia with K+ 2.7 yesterday and now 3.4  Also needs total hip surgery Rt hip  For questions or updates, please contact Deanna Spencer Please consult [email or web address] for contact info under Cardiology/STEMI.      Signed, Deanna Kicks, NP  11/28/2016, 4:12 PM   As above; pt seen and examined; no CP; mild dyspnea; HR elevated; will add metoprolol 25 mg BID and follow; check echo for LV function; Watchman in place (no anticoagulation due to h/o bleeding); can DC when HR improved; will plan to resume ASA or plavix as outpt. Deanna Spencer

## 2016-11-28 NOTE — Progress Notes (Addendum)
Initial Nutrition Assessment  DOCUMENTATION CODES:   Not applicable  INTERVENTION:   -Snacks TID -Premier Protein BID, each supplement provides 160 kcals and 30 grams protein  NUTRITION DIAGNOSIS:   Increased nutrient needs related to post-op healing as evidenced by estimated needs.  GOAL:   Patient will meet greater than or equal to 90% of their needs  MONITOR:   PO intake, Supplement acceptance, Labs, Weight trends, Skin, I & O's  REASON FOR ASSESSMENT:   Consult Assessment of nutrition requirement/status  ASSESSMENT:   76 year old woman with complex abdominal surgical history who presents with 2 days of upper abdominal pain which is progressively worsened.  Procedure performed 11/22/16: 1. Exploratory laparotomy 2. Revision of gastrojejunostomy 3. Esophagogastroscopy  Spoke with pt at bedside. She reports she eats very little at baseline- she typically eats every 2 hours as a result of roux en y gastric bypass surgery, which she underwent in 2005. Meals include foods such as roast beef and vegetables, english muffin with cheese, or asparagus with Hollandaise sauce. Pt reports that she started at 237# and now typically maintain a wt of around 125#, however, pt has noticed recent wt fluctuations ("it goes up and down, up and down").   Pt shares that she is tolerating meals well and often holds onto meal items to consume for later. Noted pt had a yogurt and pudding at bedside. She denies being on protein supplements or vitamins at home.   Noted some fat loss on exam, which may be related to extreme wt loss as a result of bariatric surgery. Pt also with some wasting in arm and shoulder area, due to multiple orthopedic-related surgeries.   Discussed importance of good meal and supplement intake to promote healing. Pt is currently on a soft diet, which is designed to be low in fat, fiber, as well as avoiding spicy foods and caffeine, to assist pts who are recovering from  GI-related surgeries and illnesses.   Visit was terminated early, due to cardiology consult.   Labs reviewed: K: 3.4 (on PO supplementation), Mg: 1.6 (on IV supplementation).   NUTRITION - FOCUSED PHYSICAL EXAM:    Most Recent Value  Orbital Region  No depletion  Upper Arm Region  Mild depletion  Thoracic and Lumbar Region  No depletion  Buccal Region  No depletion  Temple Region  No depletion  Clavicle Bone Region  Mild depletion  Clavicle and Acromion Bone Region  Mild depletion  Scapular Bone Region  Mild depletion  Dorsal Hand  No depletion  Patellar Region  No depletion  Anterior Thigh Region  No depletion  Posterior Calf Region  No depletion  Edema (RD Assessment)  Mild  Hair  Reviewed  Eyes  Reviewed  Mouth  Reviewed  Skin  Reviewed  Nails  Reviewed       Diet Order:  DIET SOFT Room service appropriate? Yes; Fluid consistency: Thin  EDUCATION NEEDS:   Education needs have been addressed  Skin:  Skin Assessment: Skin Integrity Issues: Skin Integrity Issues:: Incisions Incisions: abdomen  Last BM:  11/28/16  Height:   Ht Readings from Last 1 Encounters:  11/21/16 5' (1.524 m)    Weight:   Wt Readings from Last 1 Encounters:  11/21/16 115 lb (52.2 kg)    Ideal Body Weight:  45.5 kg  BMI:  Body mass index is 22.46 kg/m.  Estimated Nutritional Needs:   Kcal:  1550-1750  Protein:  80-95 grams  Fluid:  > 1.5 L    Momo Braun  Rosalie Doctor, RD, LDN, CDE Pager: [phone] After hours Pager: [phone]

## 2016-11-28 NOTE — Discharge Summary (Signed)
Physician Discharge Summary  Patient ID: Deanna Spencer MRN: [id number] DOB/AGE: Aug 30, 1940 76 y.o.  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 Reuel Derby Course:  76 year old woman with complex abdominal surgical history who presents with 2 days of upper abdominal pain which is progressively worsened. Associated with chills. The pain is in the upper abdomen and is aggravated by any movement or palpation.   She has a history of an antecolic antegastric Roux-en-Y gastric bypass about 15 years ago. This was complicated by recalcitrant marginal ulcers with multiple episodes of bleeding. She has undergone laparoscopic revision of her gastrojejunostomy twice, the most recent was about a year ago. According to her surgeon, Dr. Dorthula Rue in [facility], she at one point had a CT angiogram which gave concern that the left  gastric artery was out, however they did do firefly during her last revision and the pouch appeared to be very well perfused. He also states that at one point she had a very severe posterior ulcer which had eroded into the pancreas.  She is recently moved down here to be closer to her daughter who is her healthcare proxyand helps her manage her multiple medical problems. She was actually slated to undergo upper endoscopy tomorrow with Dr. Loletha Carrow to assess ulcer healing as she most recently had a flare in September of this year; as she is hoping to have a right hip replacement in the near future. She remains on a baby aspirin due to her history of an MI that omately 18 months ago. At that time she had new onset A. Fib as well. She was momentarily on Coumadin which was then discontinued after placement of the left atrial appendage watchman device sometime in the last few months Post op she made good progress, we ask Medicine and cardiology to see and assist with her care.  She has atrial fibrillation/flutter and had some tachycardia.  She also had some issues with hypokalemia, and low magnesium.  These were replaced.  Cardiology to see and assist with her rate control.  She had a UGI on 11/25/16, which showed no leak.  She had some issues with rate control and Cardiology started her on Lopressor 50 mg BID.  We repeated a CT scan 11/29/16 that showed no significant issues.  New finding of 3.5 cm AAA, and some possible sclerotic changes in one rib.    We have her set up with home PT, staples were starting to get red so we removed them with the drain on 11/30/16.  We applied  Benzoin and steri stripped the sites.    CBC Latest Ref Rng & Units 11/30/2016 11/29/2016 11/27/2016  WBC 4.0 - 10.5 K/uL 8.5 9.7 7.3  Hemoglobin 12.0 - 15.0 g/dL 11.6(L) 11.0(L) 9.4(L)  Hematocrit 36.0 - 46.0 % 36.7 34.9(L) 30.6(L)  Platelets 150 - 400 K/uL 467(H) 399 331   CMP Latest Ref Rng & Units 11/30/2016 11/29/2016 11/28/2016   Glucose 65 - 99 mg/dL 76 106(H) 91  BUN 6 - 20 mg/dL 8 5(L) <5(L)  Creatinine 0.44 - 1.00 mg/dL 0.77 0.79 0.66  Sodium 135 - 145 mmol/L 141 141 142  Potassium 3.5 - 5.1 mmol/L 4.1 4.4 3.4(L)  Chloride 101 - 111 mmol/L 105 110 116(H)  CO2 22 - 32 mmol/L 25 23 22   Calcium 8.9 - 10.3 mg/dL 9.4 9.1 8.0(L)  Total Protein 6.5 - 8.1 g/dL - 5.9(L) -  Total Bilirubin 0.3 - 1.2 mg/dL - 0.5 -  Alkaline Phos 38 - 126 U/L - 53 -  AST 15 - 41 U/L - 18 -  ALT 14 - 54 U/L - 14 -   Condition on D/C:  Improved      Disposition:   Home   Allergies as of 11/30/2016      Reactions   Demerol [meperidine] Anaphylaxis      Medication List    STOP taking these medications   aspirin EC 81 MG tablet   BEANO Tabs   cetirizine 10 MG tablet Commonly known as:  ZYRTEC   multivitamin with minerals Tabs tablet   omeprazole 20 MG capsule Commonly known as:  PRILOSEC   OVER THE COUNTER MEDICATION   OVER THE COUNTER MEDICATION     TAKE these medications   acetaminophen 500 MG tablet Commonly known as:  TYLENOL Take 1,000 mg every 6 (six) hours as needed by mouth (pain).   colchicine 0.6 MG tablet Take 0.6 mg 2 (two) times daily as needed by mouth (gout attacks).   ferrous sulfate 325 (65 FE) MG tablet You can resume this in a couple weeks after your feeling better. What changed:    how much to take  how to take this  when to take this  additional instructions   flintstones complete 60 MG chewable tablet Chew 1 tablet 3 (three) times daily by mouth.   gabapentin 300 MG capsule Commonly known as:  NEURONTIN Take 1 capsule (300 mg total) 3 (three) times daily by mouth.   HYDROcodone-acetaminophen 5-325 MG tablet Commonly known as:  NORCO/VICODIN You can resume using this as instructed before;  when you are no longer taking plain Tylenol (acetaminophin) and Oxycodone. What changed:    how much to take  how to take this  when to take this  reasons to take  this  additional instructions   loperamide 2 MG capsule Commonly known as:  IMODIUM Take 2 mg 3 (three) times daily as needed by mouth for diarrhea or loose stools.   metoprolol tartrate 50 MG tablet Commonly known as:  LOPRESSOR Take 1 tablet (50 mg total) by mouth 2 (two) times daily.   mirabegron ER 50 MG Tb24 tablet Commonly known as:  MYRBETRIQ Take 1 tablet (50 mg total) daily by mouth.   oxyCODONE 5 MG immediate release tablet Commonly known as:  Oxy IR/ROXICODONE You can use this for pain not relieved by the plain Tylenol you are taking every 6 hours.   You can take 1-2 tablets of Oxycodone every 6 hours as needed for pain not relieved by  the plain Tylenol.    This is similar to your Vicodin, Vicodin has Tylenol and hydrocodone.  Do not take the Vicodin while your taking the Tylenol every 6 hours.    DO NOT TAKE MORE THAN 4000 MG OF TYLENOL PER DAY.  IT CAN HARM YOUR LIVER.  TYLENOL (ACETAMINOPHEN) I   pantoprazole 40 MG tablet Commonly known as:  PROTONIX Take 1 tablet (40 mg total) by mouth 2 (two) times daily.   rOPINIRole 0.5 MG tablet Commonly known as:  REQUIP Take 1 tablet (0.5 mg total) 3 (three) times daily by mouth. What changed:    how much to take  when to take this  additional instructions   sucralfate 1 GM/10ML suspension Commonly known as:  CARAFATE Take 1 g 4 (four) times daily by mouth.            Durable Medical Equipment  (From admission, onward)        Start     Ordered   11/25/16 1514  For home use only DME Walker rolling  Once    Question:  Patient needs a walker to treat with the following condition  Answer:  Perforated viscus   11/25/16 1515     Follow-up Information    Clovis Riley, MD Follow up on 12/15/2016.   Specialty:  General Surgery Why:  Your appointment is at 10:15 AM be at the office 30 minutes early for check in.  Bring photo ID and insurance information.   Contact information: [phone]         Nuala Alpha, DO Follow up.   Specialty:  Family Medicine Why:  Please call and follow up in about 1-2 weeks.  Be sure he know about surgery, your new medication, and reads discharge summary from the hospital.   Contact information: [address] [place] [address] [phone]        Lelon Perla, MD Follow up.   Specialty:  Cardiology Why:  our office will call you with a follow-up appointment  Contact information: [address] [place] [address] [phone]           Signed: Earnstine Regal 11/30/2016, 12:45 PM

## 2016-11-28 NOTE — Care Management Note (Signed)
Case Management Note  Patient Details  Name: Deanna Spencer MRN: [id number] Date of Birth: 06/14/1940  Subjective/Objective:                    Action/Plan:  Confirmed face sheet information. Patient does not want 3 in 1  Expected Discharge Date:                  Expected Discharge Plan:  [place]  In-House Referral:     Discharge planning Services  CM Consult  Post Acute Care Choice:  Durable Medical Equipment, Home Health Choice offered to:  Patient  DME Arranged:  [facility] rolling DME Agency:  [place] Arranged:  PT [place]:  [place]  Status of Service:  Completed, signed off  If discussed at [place] of Stay Meetings, dates discussed:    Additional Comments:  Marilu Favre, RN 11/28/2016, 11:33 AM

## 2016-11-29 ENCOUNTER — Inpatient Hospital Stay (HOSPITAL_COMMUNITY): Payer: Medicare Other

## 2016-11-29 ENCOUNTER — Encounter (HOSPITAL_COMMUNITY): Payer: Self-pay | Admitting: *Deleted

## 2016-11-29 LAB — COMPREHENSIVE METABOLIC PANEL
ALBUMIN: 2.7 g/dL — AB (ref 3.5–5.0)
ALK PHOS: 53 U/L (ref 38–126)
ALT: 14 U/L (ref 14–54)
ANION GAP: 8 (ref 5–15)
AST: 18 U/L (ref 15–41)
BUN: 5 mg/dL — ABNORMAL LOW (ref 6–20)
CALCIUM: 9.1 mg/dL (ref 8.9–10.3)
CO2: 23 mmol/L (ref 22–32)
Chloride: 110 mmol/L (ref 101–111)
Creatinine, Ser: 0.79 mg/dL (ref 0.44–1.00)
GFR calc non Af Amer: >60 mL/min (ref >60)
GLUCOSE: 106 mg/dL — AB (ref 65–99)
POTASSIUM: 4.4 mmol/L (ref 3.5–5.1)
SODIUM: 141 mmol/L (ref 135–145)
Total Bilirubin: 0.5 mg/dL (ref 0.3–1.2)
Total Protein: 5.9 g/dL — ABNORMAL LOW (ref 6.5–8.1)

## 2016-11-29 LAB — CBC
HCT: 34.9 % — ABNORMAL LOW (ref 36.0–46.0)
Hemoglobin: 11 g/dL — ABNORMAL LOW (ref 12.0–15.0)
MCH: 30.5 pg (ref 26.0–34.0)
MCHC: 31.5 g/dL (ref 30.0–36.0)
MCV: 96.7 fL (ref 78.0–100.0)
PLATELETS: 399 10*3/uL (ref 150–400)
RBC: 3.61 MIL/uL — AB (ref 3.87–5.11)
RDW: 19.7 % — ABNORMAL HIGH (ref 11.5–15.5)
WBC: 9.7 10*3/uL (ref 4.0–10.5)

## 2016-11-29 LAB — MAGNESIUM: MAGNESIUM: 2 mg/dL (ref 1.7–2.4)

## 2016-11-29 LAB — LIPASE, BLOOD: Lipase: 20 U/L (ref 11–51)

## 2016-11-29 MED ORDER — BARIUM SULFATE 2.1 % PO SUSP
ORAL | Status: AC
  Administered 2016-11-29: 450 mL
  Filled 2016-11-29: qty 2

## 2016-11-29 MED ORDER — IOPAMIDOL (ISOVUE-300) INJECTION 61%
INTRAVENOUS | Status: AC
  Administered 2016-11-29: 80 mL
  Filled 2016-11-29: qty 100

## 2016-11-29 MED ORDER — POTASSIUM CHLORIDE CRYS ER 20 MEQ PO TBCR
20.0000 meq | EXTENDED_RELEASE_TABLET | Freq: Every day | ORAL | Status: DC
  Administered 2016-11-29 – 2016-11-30 (×2): 20 meq via ORAL
  Filled 2016-11-29 (×2): qty 1

## 2016-11-29 MED ORDER — METOPROLOL TARTRATE 50 MG PO TABS
50.0000 mg | ORAL_TABLET | Freq: Two times a day (BID) | ORAL | Status: DC
  Administered 2016-11-29 – 2016-11-30 (×3): 50 mg via ORAL
  Filled 2016-11-29 (×3): qty 1

## 2016-11-29 NOTE — Progress Notes (Signed)
Progress Note  Patient Name: Deanna Spencer Date of Encounter: 11/29/2016  Primary Cardiologist: new Dr. Tamala Julian --Dr. Conley Canal in [place]   No chest pain or dyspnea; complains of abdominal pain  Inpatient Medications    Scheduled Meds: . gabapentin  300 mg Oral TID  . heparin  5,000 Units Subcutaneous Q8H  . mouth rinse  15 mL Mouth Rinse BID  . metoprolol tartrate  25 mg Oral BID  . mirabegron ER  50 mg Oral Daily  . pantoprazole  40 mg Oral BID  . potassium chloride  20 mEq Oral TID  . protein supplement shake  11 oz Oral BID BM  . rOPINIRole  0.5 mg Oral TID   Continuous Infusions:  PRN Meds: acetaminophen, colchicine, diphenhydrAMINE, hydrALAZINE, iopamidol, metoprolol tartrate, morphine injection, ondansetron **OR** ondansetron (ZOFRAN) IV, oxyCODONE, sucralfate   Vital Signs    Vitals:   11/28/16 0854 11/28/16 1430 11/28/16 2100 11/29/16 0500  BP: 128/83 124/80 133/89 (!) 154/95  Pulse: 92 89 (!) 120 (!) 111  Resp:  16 18 18   Temp:  98.8 F (37.1 C) 98.3 F (36.8 C) 98.2 F (36.8 C)  TempSrc:  Oral Oral Oral  SpO2: 99% 99% 98% 100%  Weight:      Height:        Intake/Output Summary (Last 24 hours) at 11/29/2016 0913 Last data filed at 11/29/2016 0600 Gross per 24 hour  Intake 1040 ml  Output 220 ml  Net 820 ml   Filed Weights   11/21/16 2056  Weight: 115 lb (52.2 kg)    Telemetry    A fib rate elevated  - Personally Reviewed   Physical Exam   GEN: WD, No acute distress Neck: supple Cardiac: irregular and tachycardic Respiratory: CTA GI: S/P abdominal surgery; tender MS: No edema Neuro:  Grossly intact   Labs    Chemistry Recent Labs  Lab 11/27/16 0952 11/28/16 0448 11/29/16 0610  NA 140 142 141  K 2.7* 3.4* 4.4  CL 114* 116* 110  CO2 20* 22 23  GLUCOSE 113* 91 106*  BUN <5* <5* 5*  CREATININE 0.68 0.66 0.79  CALCIUM 8.2* 8.0* 9.1  PROT  --   --  5.9*  ALBUMIN  --   --  2.7*  AST  --   --  18  ALT  --    --  14  ALKPHOS  --   --  53  BILITOT  --   --  0.5  GFRNONAA >60 >60 >60  GFRAA >60 >60 >60  ANIONGAP 6 4* 8     Hematology Recent Labs  Lab 11/23/16 0614 11/27/16 0952 11/29/16 0610  WBC 12.6* 7.3 9.7  RBC 3.34* 3.21* 3.61*  HGB 10.2* 9.4* 11.0*  HCT 32.6* 30.6* 34.9*  MCV 97.6 95.3 96.7  MCH 30.5 29.3 30.5  MCHC 31.3 30.7 31.5  RDW 20.2* 18.5* 19.7*  PLT 292 331 399     Patient Profile     76 y.o. female with past medical history of hypertension, GERD, anemia, RA, chronic atrial fibrillation s/p watchman device, GI bleed with previous gastric bypass surgery now being seen for a fib (admit for abd pain with bowel perf.  Exp. Lat and GJ repair)     Assessment & Plan    Chronic a fib/PPM/ watchman device --no anticoagulation given h/o bleeding; s/p watchman. HR elevated; increase metoprolol to 50 mg BID. Echo to assess LV function. Would not DC until HR improves.  ABD pain with bowel perforation s/p exp lap and GJ repair-pt complains of abdominal pain and is concerned; further management per surgery.  Hx PPM--medtronic  HX MI Can consider resuming plavix as outpt; avoid for now given recent surgery.  Hypokalemia resolved.  For questions or updates, please contact [place] Please consult [email or web address] for contact info under Cardiology/STEMI.      Signed, Kirk Ruths, MD  11/29/2016, 9:13 AM

## 2016-11-29 NOTE — Progress Notes (Signed)
Physical Therapy Treatment Patient Details Name: Deanna Spencer MRN: [id number] DOB: [birth year] Today's Date: 11/29/2016    History of Present Illness 76 y.o. female admitted on 11/21/16 for abdominal pain, surgery consulted with suspected peforated viscus and pt underwent exploratory laparotomy with post op JP drain.  Pt with significant PMH of several complex abdominal surgeries including, but not limited to gastirc bypass, multiple episodes of bleeding ulcers, laparascopic revision of gastrojejunostomy, MI, a-fib, L atrial appendage watchman (after going off coumadin), and is in need of a R hip replacement due to bad R hip OA, bil TSA, and R foot fusion.  Post op course complicated by A-fib with RVR    PT Comments    Pt is not progressing as well as initially thought she would and new information about her lack of assistance at home has come forward.  HR is better controlled today, but she needs to be closely monitored during activity to ensure that she is staying in a safe HR range (100s-110s today 110s-140s yesterday).  Due to the tenuous nature of her post op condition and lack of consistent assistance at home, pt would benefit from SNF for rehab (with daily therapy) to get stronger and recover so that she can be safely home alone while her daughter works most of the day.  Pt is agreeable to see if this is an option.  PT will continue to follow acutely in conjunction with OT and mobility tech.     Follow Up Recommendations  SNF;Supervision for mobility/OOB     Equipment Recommendations  Rolling walker with 5" wheels    Recommendations for Other Services   NA     Precautions / Restrictions Precautions Precautions: Fall Precaution Comments: mildly unsteady on her feet with bad R hip OA and painful abdomen Restrictions Weight Bearing Restrictions: No    Mobility  Bed Mobility               General bed mobility comments: Pt was OOB in the recliner chair.   Transfers Overall  transfer level: Needs assistance Equipment used: Rolling walker (2 wheeled) Transfers: Sit to/from Stand Sit to Stand: Min guard         General transfer comment: Min guard assist from recliner chair and lower commode.  Extra time needed and reliance on UE support for transitions.  Pt does not always make it up on the first try.   Ambulation/Gait Ambulation/Gait assistance: Min guard Ambulation Distance (Feet): 15 Feet Assistive device: Rolling walker (2 wheeled) Gait Pattern/deviations: Step-through pattern;Antalgic Gait velocity: decreased Gait velocity interpretation: Below normal speed for age/gender General Gait Details: Moderately antalgic gait pattern due to R hip OA and trunk flexed.  Min guard assist for the occasional balance check.       Balance Overall balance assessment: Needs assistance Sitting-balance support: Feet supported;No upper extremity supported Sitting balance-Leahy Scale: Fair Sitting balance - Comments: Has difficulty reaching down due to abdominal discomfort and hip discomfort   Standing balance support: Bilateral upper extremity supported;No upper extremity supported;Single extremity supported;During functional activity Standing balance-Leahy Scale: Poor Standing balance comment: one LOB at sink while working on ADL with OT requiring min assist.  Otherwise, close supervision and pt often leaning on her elbows for balance at the sink.                             Cognition Arousal/Alertness: Awake/alert Behavior During Therapy: WFL for tasks assessed/performed Overall Cognitive Status:  Within Functional Limits for tasks assessed                                           General Comments General comments (skin integrity, edema, etc.): Pt just walked with the mobility tech down the hallway and needs to drink her contrast for CT later this afternoon, so further gait (further than in her room) deferred at this time.  I did  discuss with pt her wishes on rehab vs home.  She reports to me that her daughter has two full time jobs and does not have the ability to care for her in this tenuous post op recovery phase the way she needs to be cared for and she is open to [place] rehab to get stronger and heal more while being closely monitored.       Pertinent Vitals/Pain Pain Assessment: Faces Faces Pain Scale: Hurts even more Pain Location: left side of abdomen and right hip Pain Descriptors / Indicators: Sore Pain Intervention(s): Limited activity within patient's tolerance;Monitored during session;Repositioned           PT Goals (current goals can now be found in the care plan section) Acute Rehab PT Goals Patient Stated Goal: to get better so she can have her hip replaced.  Progress towards PT goals: Progressing toward goals    Frequency    Min 3X/week      PT Plan Current plan remains appropriate    Co-evaluation PT/OT/SLP Co-Evaluation/Treatment: Yes Reason for Co-Treatment: Other (comment)(need to re-assess in a timely manner) PT goals addressed during session: Mobility/safety with mobility;Balance;Proper use of DME        AM-PAC PT "6 Clicks" Daily Activity  Outcome Measure  Difficulty turning over in bed (including adjusting bedclothes, sheets and blankets)?: A Little Difficulty moving from lying on back to sitting on the side of the bed? : Unable Difficulty sitting down on and standing up from a chair with arms (e.g., wheelchair, bedside commode, etc,.)?: Unable Help needed moving to and from a bed to chair (including a wheelchair)?: A Little Help needed walking in hospital room?: A Little Help needed climbing 3-5 steps with a railing? : A Little 6 Click Score: 14    End of Session   Activity Tolerance: Patient limited by pain Patient left: in chair;with call bell/phone within reach   PT Visit Diagnosis: Muscle weakness (generalized) (M62.81);Pain;Difficulty in walking, not  elsewhere classified (R26.2) Pain - Right/Left: Right(and left) Pain - part of body: Hip(left abdomen)     Time: 2563-8937 PT Time Calculation (min) (ACUTE ONLY): 29 min  Charges:  $Therapeutic Activity: 8-22 mins          Deantae Shackleton B. Ani Deoliveira, PT, DPT [phone]            11/29/2016, 12:23 PM

## 2016-11-29 NOTE — Progress Notes (Signed)
Discussed care with daughter and patient in her room today, her daughter was on the phone, voice over the speaker.  Her daughter has multiple issues she is concerned with.  1.  She thinks her mother should be on a pured diet and wants our physicians to talk to her GI doctor Dr. Dorthula Rue in [place] [phone].  2.  She is upset about the abdominal pain patient still has, patient is describing them in is worried about new or recurrent ulcer disease.  3.  They are concerned about her blood pressure and her tachycardia.  I explained we have cardiology working on this and is not completely unexpected with atrial fibrillation/flutter, pain, and anxiety.  4.  Her daughter is worried about her ability to ambulate.  She has chronic hip issues and needs hip surgery.  The patient yesterday told me she is okay with going home.  We ordered physical therapy to assist with her care at home.  She had also been  Seen by OT;  They did not think she needed further OT therapy.  Her daughter reports she makes the decisions for her mother and no one has discussed this with her, she does not think her mother can go home in this condition and wants to discuss skilled nursing facility/rehab facility.  Apparently they were told her insurance (Medicare) would not pay for this based on current evaluations.  I will review with DR. Dalbert Batman, we will repeat the CT today with contrast, oral and IV.  Cardiology is working on her heart rate control and BP issues also.  I will ask Case management to see again and discuss options with the daughter [name]. Recheck labs again after CT tomorrow. I placed her on an advanced Bariatric diet and will ask nutrition to see also.

## 2016-11-29 NOTE — Care Management Note (Signed)
Case Management Note  Patient Details  Name: Deanna Spencer MRN: [id number] Date of Birth: March 18, 1940  Subjective/Objective:                    Action/Plan:  Daughter concerned about patient going home with HHPT. Spoke to [place] in PT office she will ask if a PT can re eval today. SW Elissa Hefty aware. Expected Discharge Date:                  Expected Discharge Plan:  [place]  In-House Referral:     Discharge planning Services  CM Consult  Post Acute Care Choice:  Durable Medical Equipment, Home Health Choice offered to:  Patient  DME Arranged:  [facility] rolling DME Agency:  [place]:  PT [facility] Agency:  [place]  Status of Service:  In process, will continue to follow  If discussed at Long Length of Stay Meetings, dates discussed:    Additional Comments:  Deanna Favre, RN 11/29/2016, 10:07 AM

## 2016-11-29 NOTE — NC FL2 (Signed)
[place] MEDICAID FL2 LEVEL OF CARE SCREENING TOOL     IDENTIFICATION  Patient Name: Deanna Spencer Birthdate: 1940-12-22 Sex: female Admission Date (Current Location): 11/21/2016  [facility] and [place] Number:  Herbalist and Address:  [facility] [place]. [facility], [place] [address], [place], [place] [address]      Provider Number: [id number]  Attending Physician Name and Address:  Edison Pace Md, MD  Relative Name and Phone Number:   daughter, Bich, Mchaney [phone]    Current Level of Care: Hospital Recommended Level of Care: [place] Prior Approval Number:    Date Approved/Denied:   PASRR Number: [id number]  Discharge Plan: SNF    Current Diagnoses: Patient Active Problem List   Diagnosis Date Noted  . S/P exploratory laparotomy 11/22/2016  . Perforated viscus 11/22/2016  . Atrial fibrillation ([place]) 11/22/2016  . Peripheral neuropathy 11/22/2016  . GERD (gastroesophageal reflux disease) 11/22/2016  . Other chronic pain 11/18/2016  . 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 History of heart attack 11/18/2016  . Parasomnia 11/18/2016  . Need for immunization against influenza 11/18/2016  . Encounter to establish care 11/18/2016  . History of duodenal ulcer 11/18/2016    Orientation RESPIRATION BLADDER Height & Weight     Self, Time, Situation, Place  Normal Continent Weight: 115 lb (52.2 kg) Height:  5' (152.4 cm)  BEHAVIORAL SYMPTOMS/MOOD NEUROLOGICAL BOWEL NUTRITION STATUS      Continent Diet(See DC Summary)  AMBULATORY STATUS COMMUNICATION OF NEEDS Skin   Limited Assist Verbally Surgical wounds                       Personal Care Assistance Level of Assistance  Bathing, Dressing, Feeding Bathing Assistance: Limited assistance Feeding assistance: Independent Dressing Assistance: Limited assistance     Functional Limitations Info  Sight, Hearing, Speech Sight Info:  Adequate Hearing Info: Adequate Speech Info: Adequate    SPECIAL CARE FACTORS FREQUENCY  PT (By licensed PT), OT (By licensed OT)     PT Frequency: 3x week OT Frequency: 2x week            Contractures Contractures Info: Not present    Additional Factors Info  Code Status, Allergies Code Status Info: Full Code Allergies Info: DEMEROL MEPERIDINE            Current Medications (11/29/2016):  This is the current hospital active medication list Current Facility-Administered Medications  Medication Dose Route Frequency Provider Last Rate Last Dose  . acetaminophen (TYLENOL) tablet 1,000 mg  1,000 mg Oral Q6H PRN Blenda Nicely, [facility]      . colchicine tablet 0.6 mg  0.6 mg Oral BID PRN Earnstine Regal, PA-C      . diphenhydrAMINE (BENADRYL) injection 12.5 mg  12.5 mg Intravenous Q6H PRN Romana Juniper A, MD   12.5 mg at 11/23/16 2138  . gabapentin (NEURONTIN) capsule 300 mg  300 mg Oral TID Sharene Butters E, PA-C   300 mg at 11/29/16 1052  . heparin injection 5,000 Units  5,000 Units Subcutaneous Q8H Romana Juniper A, MD   5,000 Units at 11/29/16 0551  . hydrALAZINE (APRESOLINE) injection 10 mg  10 mg Intravenous Q2H PRN Romana Juniper A, MD      . iopamidol (ISOVUE-300) 61 % injection 150 mL  150 mL Intravenous Once PRN Earnstine Regal, PA-C      . MEDLINE mouth rinse  15 mL Mouth Rinse BID Romana Juniper  A, MD   15 mL at 11/28/16 2137  . metoprolol tartrate (LOPRESSOR) injection 2.5 mg  2.5 mg Intravenous Q6H PRN Earnstine Regal, PA-C   2.5 mg at 11/28/16 0931  . metoprolol tartrate (LOPRESSOR) tablet 50 mg  50 mg Oral BID Lelon Perla, MD   50 mg at 11/29/16 1052  . mirabegron ER (MYRBETRIQ) tablet 50 mg  50 mg Oral Daily Rondel Jumbo, PA-C   50 mg at 11/29/16 1052  . morphine 4 MG/ML injection 2-4 mg  2-4 mg Intravenous Q2H PRN Donnie Mesa, MD   4 mg at 11/27/16 0207  . ondansetron (ZOFRAN-ODT) disintegrating tablet 4 mg  4 mg Oral Q6H PRN Clovis Riley, MD       Or  . ondansetron (ZOFRAN) injection 4 mg  4 mg Intravenous Q6H PRN Romana Juniper A, MD      . oxyCODONE (ROXICODONE) 5 MG/5ML solution 5-10 mg  5-10 mg Oral Q4H PRN Donnie Mesa, MD   10 mg at 11/29/16 0552  . pantoprazole (PROTONIX) EC tablet 40 mg  40 mg Oral BID Earnstine Regal, PA-C   40 mg at 11/29/16 1052  . potassium chloride SA (K-DUR,KLOR-CON) CR tablet 20 mEq  20 mEq Oral Daily Earnstine Regal, PA-C   20 mEq at 11/29/16 1052  . protein supplement (PREMIER PROTEIN) liquid  11 oz Oral BID BM Fanny Skates, MD      . rOPINIRole (REQUIP) tablet 0.5 mg  0.5 mg Oral TID Sharene Butters E, PA-C   0.5 mg at 11/29/16 1052  . sucralfate (CARAFATE) 1 GM/10ML suspension 1 g  1 g Oral QID PRN Meuth, Brooke A, PA-C   1 g at 11/29/16 2119     Discharge Medications: Please see discharge summary for a list of discharge medications.  Relevant Imaging Results:  Relevant Lab Results:   Additional Information SS#:[id number] Roberts, LCSW

## 2016-11-29 NOTE — Progress Notes (Signed)
8 Days Post-Op  Subjective: Stable and alert.  Lots of anxiety Tolerating solid food.  Having loose stools. She is concerned that she still has pain She is concerned about her intermittent tachycardia.  Dr. Stanford Breed is following carefully.  No chest pain or dyspnea.  Cardiology think she can be discharged when heart rate improved. She is asking if she needs a CT scan to rule out complications She says her daughter is upset and would like to talk with [place] which we will do. She says she doesn't think she can go home with her daughter because her daughter works all day She is asking if she is to be placed in a rehabilitation facility  temporarily  Afebrile.  Heart rate 111.  Intermittent tachycardia.  RR 18.  SPO2 100% room air.  BP 154/95.  Objective: Vital signs in last 24 hours: Temp:  [98.2 F (36.8 C)-98.8 F (37.1 C)] 98.2 F (36.8 C) (11/20 0500) Pulse Rate:  [89-120] 111 (11/20 0500) Resp:  [16-18] 18 (11/20 0500) BP: (124-154)/(80-95) 154/95 (11/20 0500) SpO2:  [98 %-100 %] 100 % (11/20 0500) Last BM Date: 11/28/16  Intake/Output from previous day: 11/19 0701 - 11/20 0700 In: 1040 [P.O.:740; IV Piggyback:300] Out: 230 [Urine:200; Drains:30] Intake/Output this shift: Total I/O In: 600 [P.O.:300; IV Piggyback:300] Out: -    EXAM General appearance: alert, cooperative and no distress. Just anxious Resp: clear to auscultation bilaterally CV:   irreg irreg  Rate 100 or so GI: soft, sore, sites look fine, midline incision looks fine.  Drain is clear serous fluid, tolerating PO's well.  Appropriate postop exam     Lab Results:  No results found for this or any previous visit (from the past 24 hour(s)).   Studies/Results: No results found.  . gabapentin  300 mg Oral TID  . heparin  5,000 Units Subcutaneous Q8H  . mouth rinse  15 mL Mouth Rinse BID  . metoprolol tartrate  25 mg Oral BID  . mirabegron ER  50 mg Oral Daily  . pantoprazole  40 mg Oral BID  .  potassium chloride  20 mEq Oral TID  . protein supplement shake  11 oz Oral BID BM  . rOPINIRole  0.5 mg Oral TID     Assessment/Plan: s/p Procedure(s): EXPLORATORY LAPAROTOMY, REVISION OF  GASTROJEJUNOSTOMY, ESOPHAGOGASTROSCOPY  Perforated viscus/Hx of Roux-en Y  Large dehiscence of the anterior aspect of her gastrojejunostomy S/pExploratory laparotomy,Revision of gastrojejunostomy,Esophagogastroscopy, 11/22/16, Dr. Romana Juniper Generally stable and I am not suspicious of postop complication Will check CBC and chemistries this morning  Hypomagnesemia/hypokalemia - replace K+ and Mag+  AF with RVR.  Management per Dr. Stanford Breed and cardiology.  Okay to discharge from cardiac standpoint once heart rate better controlled.   Active issues: Hx of MI/PTVP  IBS/GI bleed Hx of RA Atrial fibrillation - not on anticoagulation on admit- Watchman device History of Raynaud's syndrome Restless leg syndrome Gout Peripheral neuropathy/bilateral hip pain  FEN: IV fluids/soft diet-tolerating well ID: Zosyn 11/12 =>>d/c DVT: Heparin Foley: out Follow up: Dr. Kae Heller  Plan:  [place] left her some Lopressor for her Tachycardia with parameters, cardiology is now seeing about her heart rate.   She cannot have ASA after this surgery.  Finish up her Zosyn today, continue to replace Mag and K+. Aim for DC soon.  I have ordered home Health to help with ambulation and will her her a walker for home.  Need conference with daughter regarding disposition        @  PROBHOSP@  LOS: 7 days    Adin Hector 11/29/2016  . .prob

## 2016-11-29 NOTE — Progress Notes (Signed)
Occupational Therapy Treatment Patient Details Name: Deanna Spencer MRN: [id number] DOB: [birth year] Today's Date: 11/29/2016    History of present illness 76 y.o. female admitted on 11/21/16 for abdominal pain, surgery consulted with suspected peforated viscus and pt underwent exploratory laparotomy with post op JP drain.  Pt with significant PMH of several complex abdominal surgeries including, but not limited to gastirc bypass, multiple episodes of bleeding ulcers, laparascopic revision of gastrojejunostomy, MI, a-fib, L atrial appendage watchman (after going off coumadin), and is in need of a R hip replacement due to bad R hip OA, bil TSA, and R foot fusion.  Post op course complicated by A-fib with RVR   OT comments  Pt's occupational performance continues to be limited by significant abdomen and hip pain. Pt reporting new information about lack of assistance and supervision for dc home. HR ~111-125 during toileting and grooming at sink. Update dc recommendation to SNF for rehab to increase safety and independence with ADLs and functional mobility before returning home. Will continue to follow acutely to facilitate safe dc.   Follow Up Recommendations  SNF;Supervision/Assistance - 24 hour    Equipment Recommendations  None recommended by OT    Recommendations for Other Services      Precautions / Restrictions Precautions Precautions: Fall Precaution Comments: mildly unsteady on her feet with bad R hip OA and painful abdomen Restrictions Weight Bearing Restrictions: No       Mobility Bed Mobility               General bed mobility comments: Pt was OOB in the recliner chair.   Transfers Overall transfer level: Needs assistance Equipment used: Rolling walker (2 wheeled) Transfers: Sit to/from Stand Sit to Stand: Min guard         General transfer comment: Min guard assist from recliner chair and lower commode.  Extra time needed and reliance on UE support for  transitions.  Pt does not always make it up on the first try.     Balance Overall balance assessment: Needs assistance Sitting-balance support: Feet supported;No upper extremity supported Sitting balance-Leahy Scale: Fair Sitting balance - Comments: Has difficulty reaching down due to abdominal discomfort and hip discomfort   Standing balance support: Bilateral upper extremity supported;No upper extremity supported;Single extremity supported;During functional activity Standing balance-Leahy Scale: Poor Standing balance comment: one LOB at sink for grooming requiring min assist.  Otherwise, close supervision and pt often leaning on her elbows for balance at the sink.                            ADL either performed or assessed with clinical judgement   ADL Overall ADL's : Needs assistance/impaired     Grooming: Min guard;Minimal assistance;Standing;Oral care;Wash/dry hands Grooming Details (indicate cue type and reason): Pt performing oral care at sink [employer] for safety. Pt with single LOB and required Min A for recovery.                  Toilet Transfer: Minimal assistance;Ambulation;Regular Toilet;Grab bars;RW Armed forces technical officer Details (indicate cue type and reason): Pt requiring Min A to steady in standing  Toileting- Clothing Manipulation and Hygiene: Minimal assistance;Sit to/from stand Toileting - Clothing Manipulation Details (indicate cue type and reason): Min A for standing balance and clothing management     Functional mobility during ADLs: Min guard;Rolling walker General ADL Comments: Pt continues to be limited by hip and abdomen pain.  Vision       Perception     Praxis      Cognition Arousal/Alertness: Awake/alert Behavior During Therapy: WFL for tasks assessed/performed Overall Cognitive Status: Within Functional Limits for tasks assessed                                          Exercises     Shoulder Instructions        General Comments Pt just walked with the mobility tech down the hallway and needs to drink her contrast for CT later this afternoon, so further gait (further than in her room) deferred at this time.  Discussed with pt her wishes on rehab vs home.  She reports to me that her daughter has two full time jobs and does not have the ability to care for her in this tenuous post op recovery phase the way she needs to be cared for and she is open to [facility] to get stronger and heal more while being closely monitored.     Pertinent Vitals/ Pain       Pain Assessment: Faces Faces Pain Scale: Hurts even more Pain Location: left side of abdomen and right hip Pain Descriptors / Indicators: Sore Pain Intervention(s): Monitored during session;Limited activity within patient's tolerance;Repositioned  Home Living                                          Prior Functioning/Environment              Frequency  Min 2X/week        Progress Toward Goals  OT Goals(current goals can now be found in the care plan section)  Progress towards OT goals: Progressing toward goals  Acute Rehab OT Goals Patient Stated Goal: to get better so she can have her hip replaced.  OT Goal Formulation: With patient Time For Goal Achievement: 12/09/16 Potential to Achieve Goals: Good ADL Goals Pt Will Perform Lower Body Bathing: with supervision;sit to/from stand(with or without AE) Pt Will Perform Lower Body Dressing: with supervision;sit to/from stand(with or without AE) Pt Will Transfer to Toilet: with supervision;ambulating;regular height toilet Pt Will Perform Toileting - Clothing Manipulation and hygiene: with supervision;sit to/from stand  Plan Discharge plan needs to be updated    Co-evaluation    PT/OT/SLP Co-Evaluation/Treatment: Yes Reason for Co-Treatment: Other (comment)(Need for re-assess in a timely manner) PT goals addressed during session: Mobility/safety with  mobility OT goals addressed during session: ADL's and self-care      AM-PAC PT "6 Clicks" Daily Activity     Outcome Measure   Help from another person eating meals?: None Help from another person taking care of personal grooming?: A Little Help from another person toileting, which includes using toliet, bedpan, or urinal?: A Little Help from another person bathing (including washing, rinsing, drying)?: A Little Help from another person to put on and taking off regular upper body clothing?: None Help from another person to put on and taking off regular lower body clothing?: A Little 6 Click Score: 20    End of Session Equipment Utilized During Treatment: Rolling walker  OT Visit Diagnosis: Other abnormalities of gait and mobility (R26.89);Pain Pain - Right/Left: Right Pain - part of body: Hip   Activity Tolerance Patient limited by pain  Patient Left in chair;with call bell/phone within reach   Nurse Communication Mobility status        Time: 5694-3700 OT Time Calculation (min): 29 min  Charges: OT General Charges $OT Visit: 1 Visit OT Treatments $Self Care/Home Management : 8-22 mins  [place], OTR/L Acute Rehab Pager: [phone] Office: [place] 11/29/2016, 1:53 PM

## 2016-11-29 NOTE — Plan of Care (Signed)
  Pain Managment: General experience of comfort will improve 11/29/2016 0031 - Progressing by Mickie Kay, RN

## 2016-11-29 NOTE — Progress Notes (Signed)
Nutrition Follow-up  DOCUMENTATION CODES:   Not applicable  INTERVENTION:   -Downgrade diet to dysphagia 3 (mechanical soft diet) -Continue snacks TID -Continue Premier Protein BID, each supplement provides 160 kcals and 30 grams protein  NUTRITION DIAGNOSIS:   Increased nutrient needs related to post-op healing as evidenced by estimated needs.  Ongoing  GOAL:   Patient will meet greater than or equal to 90% of their needs  Progressing  MONITOR:   PO intake, Supplement acceptance, Labs, Weight trends, Skin, I & O's  REASON FOR ASSESSMENT:   Consult Assessment of nutrition requirement/status  ASSESSMENT:   76 year old woman with complex abdominal surgical history who presents with 2 days of upper abdominal pain which is progressively worsened.  Procedure performed 11/22/16:1. Exploratory laparotomy 2. Revision of gastrojejunostomy 3. Esophagogastroscopy  RD re-consulted due to pt's daughter's concerns about soft diet.   Per chart review, pt daughter with multiple concerns about pt care. Per general surgery notes, pt daughter reports pt's doctor in [place], [place] wants pt on pureed diet and has requested MD consult with them.   Spoke with pt at bedside, who expressed frustration over situation. She states her daughter is being "overly concerned and overprotective". Once again obtained diet recall from pt; pt denies ever being on a pureed diet at home- "she even knows that; she eats with me and we often eat the same food". She reports tolerating soft diet well and reports "eating the best meal" (chicken and potatoes) last night. Pt reports she also avoids carbonated beverages and peels skin off meats- "I know what to eat. I'm not an idiot; I know what will happen if I eat the wrong things". Pt refused offer of pureed diet, stating she would not eat it if it was offered to her. Pt has also been consuming pudding off meal trays.   Diet currently held due to NPO for repeat CT  scan to ensure no complications. Surgical PA downgraded diet to bariatric advanced, however, concerned about limitations of diet- diet is designed for pts who are approximately 2 weeks post-op of bariatric surgery and are offered one protein choice with dipping sauce, 2 low carbohydrate beverages, and one non-starchy vegetable. Suspect pt will be better served on a dysphagia 3 (mechanical soft) diet, which will be a good compromise of low fiber, softer texture options for pt. This diet is also easily accommodated by most SNF, which is where pt will eventually discharge to.   Labs reviewed.   Diet Order:  Diet bariatric advanced Room service appropriate? Yes; Fluid consistency: Pudding Thick  EDUCATION NEEDS:   Education needs have been addressed  Skin:  Skin Assessment: Skin Integrity Issues: Skin Integrity Issues:: Incisions Incisions: abdomen  Last BM:  11/28/16  Height:   Ht Readings from Last 1 Encounters:  11/21/16 5' (1.524 m)    Weight:   Wt Readings from Last 1 Encounters:  11/21/16 115 lb (52.2 kg)    Ideal Body Weight:  45.5 kg  BMI:  Body mass index is 22.46 kg/m.  Estimated Nutritional Needs:   Kcal:  1550-1750  Protein:  80-95 grams  Fluid:  > 1.5 L    Ambree Frances A. Jimmye Norman, RD, LDN, CDE Pager: [phone] After hours Pager: [phone]

## 2016-11-30 ENCOUNTER — Inpatient Hospital Stay (HOSPITAL_COMMUNITY): Payer: Medicare Other

## 2016-11-30 ENCOUNTER — Other Ambulatory Visit (HOSPITAL_COMMUNITY): Payer: PRIVATE HEALTH INSURANCE

## 2016-11-30 DIAGNOSIS — I341 Nonrheumatic mitral (valve) prolapse: Secondary | ICD-10-CM

## 2016-11-30 LAB — CBC
HCT: 36.7 % (ref 36.0–46.0)
Hemoglobin: 11.6 g/dL — ABNORMAL LOW (ref 12.0–15.0)
MCH: 30.7 pg (ref 26.0–34.0)
MCHC: 31.6 g/dL (ref 30.0–36.0)
MCV: 97.1 fL (ref 78.0–100.0)
Platelets: 467 10*3/uL — ABNORMAL HIGH (ref 150–400)
RBC: 3.78 MIL/uL — ABNORMAL LOW (ref 3.87–5.11)
RDW: 19.4 % — AB (ref 11.5–15.5)
WBC: 8.5 10*3/uL (ref 4.0–10.5)

## 2016-11-30 LAB — BASIC METABOLIC PANEL
Anion gap: 11 (ref 5–15)
BUN: 8 mg/dL (ref 6–20)
CHLORIDE: 105 mmol/L (ref 101–111)
CO2: 25 mmol/L (ref 22–32)
CREATININE: 0.77 mg/dL (ref 0.44–1.00)
Calcium: 9.4 mg/dL (ref 8.9–10.3)
GFR calc Af Amer: >60 mL/min (ref >60)
GFR calc non Af Amer: >60 mL/min (ref >60)
Glucose, Bld: 76 mg/dL (ref 65–99)
POTASSIUM: 4.1 mmol/L (ref 3.5–5.1)
SODIUM: 141 mmol/L (ref 135–145)

## 2016-11-30 LAB — ECHOCARDIOGRAM COMPLETE
Height: 60 in
WEIGHTICAEL: 1840 [oz_av]

## 2016-11-30 MED ORDER — HYDROCODONE-ACETAMINOPHEN 5-325 MG PO TABS: ORAL_TABLET | ORAL | 0 refills | Status: DC

## 2016-11-30 MED ORDER — METOPROLOL TARTRATE 50 MG PO TABS: 50.0000 mg | ORAL_TABLET | Freq: Two times a day (BID) | ORAL | 0 refills | Status: DC

## 2016-11-30 MED ORDER — FERROUS SULFATE 325 (65 FE) MG PO TABS: ORAL_TABLET | ORAL | 3 refills | Status: DC

## 2016-11-30 MED ORDER — PANTOPRAZOLE SODIUM 40 MG PO TBEC
40.0000 mg | DELAYED_RELEASE_TABLET | Freq: Two times a day (BID) | ORAL | 1 refills | Status: DC
Start: 2016-11-30 — End: 2017-05-25

## 2016-11-30 MED ORDER — OXYCODONE HCL 5 MG PO TABS: ORAL_TABLET | ORAL | 0 refills | Status: DC

## 2016-11-30 NOTE — Progress Notes (Signed)
Progress Note  Patient Name: Deanna Spencer Date of Encounter: 11/30/2016  Primary Cardiologist: Dr Stanford Breed  Subjective   Abd pain improved; no CP or dyspnea  Inpatient Medications    Scheduled Meds: . gabapentin  300 mg Oral TID  . heparin  5,000 Units Subcutaneous Q8H  . mouth rinse  15 mL Mouth Rinse BID  . metoprolol tartrate  50 mg Oral BID  . mirabegron ER  50 mg Oral Daily  . pantoprazole  40 mg Oral BID  . potassium chloride  20 mEq Oral Daily  . protein supplement shake  11 oz Oral BID BM  . rOPINIRole  0.5 mg Oral TID   Continuous Infusions:  PRN Meds: acetaminophen, colchicine, diphenhydrAMINE, hydrALAZINE, iopamidol, metoprolol tartrate, ondansetron **OR** ondansetron (ZOFRAN) IV, oxyCODONE, sucralfate   Vital Signs    Vitals:   11/29/16 1504 11/29/16 2147 11/30/16 0445 11/30/16 1025  BP: 124/87 (!) 150/90 (!) 146/78 115/79  Pulse: 92 94 94 (!) 116  Resp: 20 20 20    Temp: 98.2 F (36.8 C) 98.6 F (37 C) 98.2 F (36.8 C)   TempSrc: Oral Oral Oral   SpO2: 98% 97% 99%   Weight:      Height:        Intake/Output Summary (Last 24 hours) at 11/30/2016 1048 Last data filed at 11/30/2016 4268 Gross per 24 hour  Intake 1560 ml  Output 15 ml  Net 1545 ml   Filed Weights   11/21/16 2056  Weight: 115 lb (52.2 kg)    Telemetry    A fib rate upper normal - Personally Reviewed   Physical Exam   GEN: WD/WN, No acute distress Neck: supple, no JVD Cardiac: irregular Respiratory: CTA; no wheeze GI: S/P abdominal surgery; less tender compared to previous MS: No edema Neuro:  No focal findings   Labs    Chemistry Recent Labs  Lab 11/28/16 0448 11/29/16 0610 11/30/16 0753  NA 142 141 141  K 3.4* 4.4 4.1  CL 116* 110 105  CO2 22 23 25   GLUCOSE 91 106* 76  BUN <5* 5* 8  CREATININE 0.66 0.79 0.77  CALCIUM 8.0* 9.1 9.4  PROT  --  5.9*  --   ALBUMIN  --  2.7*  --   AST  --  18  --   ALT  --  14  --   ALKPHOS  --  53  --   BILITOT  --   0.5  --   GFRNONAA >60 >60 >60  GFRAA >60 >60 >60  ANIONGAP 4* 8 11     Hematology Recent Labs  Lab 11/27/16 0952 11/29/16 0610 11/30/16 0753  WBC 7.3 9.7 8.5  RBC 3.21* 3.61* 3.78*  HGB 9.4* 11.0* 11.6*  HCT 30.6* 34.9* 36.7  MCV 95.3 96.7 97.1  MCH 29.3 30.5 30.7  MCHC 30.7 31.5 31.6  RDW 18.5* 19.7* 19.4*  PLT 331 399 467*     Patient Profile     76 y.o. female with past medical history of hypertension, GERD, anemia, RA, chronic atrial fibrillation s/p watchman device, GI bleed with previous gastric bypass surgery now being seen for a fib (admit for abd pain with bowel perf.  Exp. Lat and GJ repair)     Assessment & Plan    Chronic a fib/PPM/ watchman device --no anticoagulation given h/o bleeding; s/p watchman. HR improved; continue metoprolol 50 mg BID. Pt can be DCed from a cardiac standpoint. Will arrange outpt echo and TOC appt  with APP 1 week; fu with me 3 months. HR should continue to improve the further removed from hyperadrenergic state associated with surgery  ABD pain with bowel perforation s/p exp lap and GJ repair-Management per general surgery.  Hx PPM--medtr[employer]-will arrange outpt EP eval.  HX MI Can consider resuming plavix as outpt; avoid for now given recent surgery.  Hypokalemia resolved.  For questions or updates, please contact [place] Please consult [email or web address] for contact info under Cardiology/STEMI.      Signed, Kirk Ruths, MD  11/30/2016, 10:48 AM

## 2016-11-30 NOTE — Progress Notes (Signed)
  Echocardiogram 2D Echocardiogram has been performed.  Darlina Sicilian M 11/30/2016, 2:57 PM

## 2016-11-30 NOTE — Progress Notes (Signed)
Patient's daughter, [name] just called this RN informing that she is ready to bring her mother home upon discharge.  Relayed this info to the patient and patient was very happy.  Cardiology clearance is still needed for patient's discharge to home per surgery.  Will relay this develpoment to day shift RN appropriately.

## 2016-11-30 NOTE — Progress Notes (Signed)
9 Days Post-Op  Subjective: Stable and alert.  Tolerating diet.  GI function normalizing. Still has incisional pain Afebrile Heart rate varies between 92 and 118 Labs show hemoglobin 11.0.  WBC 9700.  Creatinine 0.79.  Potassium 4.4.  Glucose 106. CT scan yesterday showed some postoperative changes.  No abnormal fluid collection.  3.5 cm infrarenal abdominal aortic aneurysm.  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 Lab and x-ray findings discussed with patient and she was reassured that she seems to be doing well  The issue at this point is disposition.   Once her heart rate is controlled and cardiology can clear her for discharge, she is medically stable and appropriate for discharge from acute care hospital, and I have told her this.   She and her daughter are considering  SNF versus discharge home.  I have told her that either is appropriate depending on the level of support that can be provided.   Objective: Vital signs in last 24 hours: Temp:  [98.2 F (36.8 C)-98.6 F (37 C)] 98.2 F (36.8 C) (11/21 0445) Pulse Rate:  [92-118] 94 (11/21 0445) Resp:  [20] 20 (11/21 0445) BP: (124-150)/(78-96) 146/78 (11/21 0445) SpO2:  [97 %-99 %] 99 % (11/21 0445) Last BM Date: 11/29/16  Intake/Output from previous day: 11/20 0701 - 11/21 0700 In: 960 [P.O.:960] Out: 15 [Drains:15] Intake/Output this shift: Total I/O In: 240 [P.O.:240] Out: 15 [Drains:15]   EXAM General appearance:alert, cooperative and no distress. Just anxious Resp:clear to auscultation bilaterally CV:   irreg irreg  Rate 100 or so QQ:VZDG, sore, sites look fine, midline incision looks fine. Drain is clear serous fluid, tolerating PO's well.  Appropriate postop exam     Lab Results:  No results found for this or any previous visit (from the past 24 hour(s)).   Studies/Results: Ct Abdomen Pelvis W Contrast  Result Date: 11/29/2016 CLINICAL DATA:  Left-sided abdominal pain. EXAM: CT ABDOMEN AND  PELVIS WITH CONTRAST TECHNIQUE: Multidetector CT imaging of the abdomen and pelvis was performed using the standard protocol following bolus administration of intravenous contrast. CONTRAST:  46mL ISOVUE-300 IOPAMIDOL (ISOVUE-300) INJECTION 61% COMPARISON:  CT scan of November 21, 2016. FINDINGS: Lower chest: No acute abnormality. Hepatobiliary: Status post cholecystectomy. 1.3 cm enhancing abnormality is noted superiorly in right hepatic lobe which was present on prior exam. Stable intrahepatic and extrahepatic biliary dilatation is noted which most likely is due to post cholecystectomy status. Pancreas: Atrophic pancreas is again noted without acute abnormality. Spleen: Normal in size without focal abnormality. Adrenals/Urinary Tract: Adrenal glands appear normal. Stable bilateral renal cortical scarring is noted. Right renal cyst is noted. No hydronephrosis or renal obstruction is noted. No renal or ureteral calculi are noted. Urinary bladder is unremarkable. Stomach/Bowel: Stable hiatal hernia is noted. Status post gastric surgery. Surgical drain is seen extending from left upper quadrant an underneath left hepatic lobe and out through right lower quadrant abdominal wall. No abnormal bowel dilatation or inflammation is noted. Status post appendectomy. Sigmoid diverticulosis is noted without inflammation. Vascular/Lymphatic: Stable 3.5 cm infrarenal abdominal aortic aneurysm is noted. No significant adenopathy is noted. Reproductive: Status post hysterectomy. No adnexal masses. Other: Moderate anasarca is noted. No abnormal fluid collection is noted. No hiatal hernia is noted. Musculoskeletal: Severe degenerative changes seen involving the right hip. Postsurgical changes are noted in the lower lumbar spine. No acute abnormality is noted. IMPRESSION: Stable hiatal hernia. Postoperative changes are seen involving the stomach, with surgical drain present entering right lower quadrant of  the abdomen and passing under  left hepatic lobe, with distal tip in left upper quadrant. No abnormal fluid collection is noted. Moderate anasarca. Stable intrahepatic and extrahepatic biliary dilatation is noted most consistent with post cholecystectomy status. Stable 3.5 cm infrarenal abdominal aortic aneurysm. Recommend followup by ultrasound in 2 years. This recommendation follows ACR consensus guidelines: White Paper of the ACR Incidental Findings Committee II on Vascular Findings. J Am Coll Radiol 2013; 10:789-794. 1.3 cm enhancing abnormality is noted in left hepatic lobe which may represent hemangioma. Nonemergent evaluation with MRI when patient can follow breathing instructions and hold still for prolonged amount of time is recommended to rule out other pathology. Electronically Signed   By: Marijo Conception, M.D.   On: 11/29/2016 14:59    . gabapentin  300 mg Oral TID  . heparin  5,000 Units Subcutaneous Q8H  . mouth rinse  15 mL Mouth Rinse BID  . metoprolol tartrate  50 mg Oral BID  . mirabegron ER  50 mg Oral Daily  . pantoprazole  40 mg Oral BID  . potassium chloride  20 mEq Oral Daily  . protein supplement shake  11 oz Oral BID BM  . 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 Romana Juniper Generally stable and I am not suspicious of postop complication CT and lab negative Tolerating diet Okay to remove drain Ready for discharge from surgical standpoint once cardiology issues stabilized Discontinue morphine Continue double dose Protonix  Hypomagnesemia/hypokalemia - replace K+ and Mag+  AF with RVR.  Management per Dr. Stanford Breed and cardiology.  Okay to discharge from cardiac standpoint once heart rate  better controlled.   Active issues: Hx of MI/PTVP  IBS/GI bleed Hx of RA Atrial fibrillation - not on anticoagulation on admit- Watchman device History of Raynaud's syndrome Restless leg syndrome Gout Peripheral neuropathy/bilateral hip pain  FEN: IV fluids/soft diet-tolerating well ID: Zosyn 11/12 =>>d/c 11/20 DVT: Heparin Foley:out Follow up: Dr. Kae Heller  Plan: discharge to home with home health nursing versus discharge to SNF for short-term rehabilitation once cardiac issues stabilized.   .  She cannot have ASA after this surgery.Marland Kitchen Antibiotics have been discontinued I have ordered home Health to help with ambulation and will her her a walker for home.  Need conference with daughter regarding disposition      @PROBHOSP @  LOS: 8 days    Adin Hector 11/30/2016  . .prob

## 2016-11-30 NOTE — Progress Notes (Signed)
Patient discharge instructions reviewed with the patient and her daughter, discharge paperwork signing and were given to the daughter per the Pt's request.  They did not have any questions. Prescriptions were given to the patient.  Pt's PIV was removed prior to discharge,  Pt's belongings were with her and she advised she had all of her belongings that she came in with and none were missing

## 2016-11-30 NOTE — Discharge Instructions (Signed)

## 2016-12-05 ENCOUNTER — Telehealth: Payer: Self-pay | Admitting: Cardiology

## 2016-12-05 NOTE — Telephone Encounter (Signed)
New Message     TOC 11/27 130p 12/08/16

## 2016-12-06 ENCOUNTER — Telehealth: Payer: Self-pay | Admitting: *Deleted

## 2016-12-06 DIAGNOSIS — M109 Gout, unspecified: Secondary | ICD-10-CM | POA: Diagnosis not present

## 2016-12-06 DIAGNOSIS — I482 Chronic atrial fibrillation: Secondary | ICD-10-CM | POA: Diagnosis not present

## 2016-12-06 DIAGNOSIS — K219 Gastro-esophageal reflux disease without esophagitis: Secondary | ICD-10-CM | POA: Diagnosis not present

## 2016-12-06 DIAGNOSIS — M160 Bilateral primary osteoarthritis of hip: Secondary | ICD-10-CM | POA: Diagnosis not present

## 2016-12-06 DIAGNOSIS — M955 Acquired deformity of pelvis: Secondary | ICD-10-CM | POA: Diagnosis not present

## 2016-12-06 DIAGNOSIS — M069 Rheumatoid arthritis, unspecified: Secondary | ICD-10-CM | POA: Diagnosis not present

## 2016-12-06 DIAGNOSIS — I7300 Raynaud's syndrome without gangrene: Secondary | ICD-10-CM | POA: Diagnosis not present

## 2016-12-06 DIAGNOSIS — Z96653 Presence of artificial knee joint, bilateral: Secondary | ICD-10-CM | POA: Diagnosis not present

## 2016-12-06 DIAGNOSIS — Z9884 Bariatric surgery status: Secondary | ICD-10-CM | POA: Diagnosis not present

## 2016-12-06 DIAGNOSIS — G2581 Restless legs syndrome: Secondary | ICD-10-CM | POA: Diagnosis not present

## 2016-12-06 DIAGNOSIS — G629 Polyneuropathy, unspecified: Secondary | ICD-10-CM | POA: Diagnosis not present

## 2016-12-06 DIAGNOSIS — Z95818 Presence of other cardiac implants and grafts: Secondary | ICD-10-CM | POA: Diagnosis not present

## 2016-12-06 DIAGNOSIS — Z48815 Encounter for surgical aftercare following surgery on the digestive system: Secondary | ICD-10-CM | POA: Diagnosis not present

## 2016-12-06 NOTE — Telephone Encounter (Signed)
Benjamine Mola needs verbal orders for the following:  1. PT 2x per week for 2 weeks 2. Verbal order to add an OT evaluation  Fleeger, Salome Spotted, [place]

## 2016-12-06 NOTE — Telephone Encounter (Signed)
Patient contacted regarding discharge from [facility]; 11/21/2016 - 11/30/2016 (9 days)  Patient understands to follow up with provider  YES, PT STATES THAT SHE IS UNABLE TO COME TO APPT TODAY BECAUSE SHE DOES NOT DRIVE, HER DAUGHTER(CARIN Mcfarland-VERBALIZES OK TO SPEAK WITH HER ABOUT ANYTHING SHE NEEDS) WILL BE CALLING TO RESCHEDULE APPT. Patient understands discharge instructions? YES Patient understands medications and regiment? YES-WITH HER DAUGHTERS HELP Patient understands to bring all medications to this visit? YES-PLEASE REMIND DAUGHTER  Appt rescheduled to 11-29 @ 130pm with PA-STRADER

## 2016-12-07 DIAGNOSIS — G629 Polyneuropathy, unspecified: Secondary | ICD-10-CM | POA: Diagnosis not present

## 2016-12-07 DIAGNOSIS — I482 Chronic atrial fibrillation: Secondary | ICD-10-CM | POA: Diagnosis not present

## 2016-12-07 DIAGNOSIS — M069 Rheumatoid arthritis, unspecified: Secondary | ICD-10-CM | POA: Diagnosis not present

## 2016-12-07 DIAGNOSIS — Z48815 Encounter for surgical aftercare following surgery on the digestive system: Secondary | ICD-10-CM | POA: Diagnosis not present

## 2016-12-07 DIAGNOSIS — M160 Bilateral primary osteoarthritis of hip: Secondary | ICD-10-CM | POA: Diagnosis not present

## 2016-12-07 DIAGNOSIS — M955 Acquired deformity of pelvis: Secondary | ICD-10-CM | POA: Diagnosis not present

## 2016-12-08 ENCOUNTER — Ambulatory Visit: Payer: PRIVATE HEALTH INSURANCE | Admitting: Student

## 2016-12-08 NOTE — Telephone Encounter (Signed)
Just let the team know and we will call the person who called and let them know. Deseree Kennon Holter, CMA

## 2016-12-08 NOTE — Telephone Encounter (Signed)
VO given to [place], Brewer with [facility] at [phone]. Hubbard Hartshorn, RN, BSN

## 2016-12-12 DIAGNOSIS — M955 Acquired deformity of pelvis: Secondary | ICD-10-CM | POA: Diagnosis not present

## 2016-12-12 DIAGNOSIS — M069 Rheumatoid arthritis, unspecified: Secondary | ICD-10-CM | POA: Diagnosis not present

## 2016-12-12 DIAGNOSIS — M160 Bilateral primary osteoarthritis of hip: Secondary | ICD-10-CM | POA: Diagnosis not present

## 2016-12-12 DIAGNOSIS — G629 Polyneuropathy, unspecified: Secondary | ICD-10-CM | POA: Diagnosis not present

## 2016-12-12 DIAGNOSIS — Z48815 Encounter for surgical aftercare following surgery on the digestive system: Secondary | ICD-10-CM | POA: Diagnosis not present

## 2016-12-12 DIAGNOSIS — I482 Chronic atrial fibrillation: Secondary | ICD-10-CM | POA: Diagnosis not present

## 2016-12-13 NOTE — Progress Notes (Signed)
Cardiology Office Note    Date:  12/14/2016   ID:  [name], [name] 1940-10-11, MRN [id number]  PCP:  Nuala Alpha, DO  Cardiologist: Dr. Stanford Breed   Chief Complaint  Patient presents with  . Hospitalization Follow-up    History of Present Illness:    Deanna Spencer is a 76 y.o. female with past medical history of chronic atrial fibrillation (s/p Watchman device in 07/2016), SSS (s/p Medtronic PPM placement), HTN, HLD, GERD, and anemia who presents to the office today for hospital follow-up.   He was recently admitted to [facility] from 11/12 - 11/30/2016 for evaluation of abdominal pain, found to have a perforated viscus and underwent exploratory laparotomy and revision of her gastrojejunostomy on 11/22/2016. Cardiology was consulted given her history of chronic atrial fibrillation and her being on ASA prior to admission. Given her recurrent bleeding and recent gastric surgery, ASA was discontinued during admission. Consideration of starting Plavix as an outpatient was recommended. She was noted to have elevated HR following her surgery, therefore she was started on Lopressor 25mg  BID which was further titrated to 50mg  BID. An echocardiogram was obtained the day of discharge which showed a reduced EF of 30-35% with moderate MR (no prior images available for comparison).   In talking with the patient today, she reports doing well since her recent hospitalization. She denies any recent chest discomfort, dyspnea on exertion, orthopnea, PND, or lower extremity edema. She does experience occasional palpitations but does not check her heart rate when this occurs.  She denies any recent melena, hematochezia, or hematuria. She reports healing well from her recent surgery and is scheduled to see her gastroenterologist later this week.    In reviewing her recent echocardiogram results, she reports not being aware of any history of CHF. No recent symptoms to suggest fluid overload. Reports weight  has been stable on her home scales.    Past Medical History:  Diagnosis Date  . Anemia   . Childhood asthma   . Chronic lower back pain   . Diverticulosis   . Duodenal ulcer   . Dyspnea   . Dysrhythmia    a fib  . GERD (gastroesophageal reflux disease)   . GI bleed    "have had both upper and lower GIB"  . Gout   . Heart attack ([place]) 2017  . Heart murmur   . History of blood transfusion 2017-2018   "related to bleeding ulcers; 47 units PRBC; 2U plasma" (11/23/2016)  . History of hiatal hernia   . Hx of blood clots    in heart  . IBS (irritable bowel syndrome)   . Osteoarthritis   . Osteoporosis   . Peripheral neuropathy   . 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 Raynaud disease   . Rheumatoid arteritis   . SSS (sick sinus syndrome) ([place])    a. s/p Medtronic PPM placement in 2017  . Stomach ulcer     Past Surgical History:  Procedure Laterality Date  . ABDOMINAL HYSTERECTOMY    . APPENDECTOMY    . CARPAL TUNNEL RELEASE Bilateral   . CATARACT EXTRACTION W/ INTRAOCULAR LENS  IMPLANT, BILATERAL Bilateral   . CHOLECYSTECTOMY OPEN    . DILATION AND CURETTAGE OF UTERUS    . FOOT FUSION Right   . INGUINAL HERNIA REPAIR Right X 2  . INSERT / REPLACE / REMOVE PACEMAKER  2017   medtronic  . JOINT REPLACEMENT    .  LAPAROTOMY N/A 11/21/2016   Procedure: EXPLORATORY LAPAROTOMY, REVISION OF  GASTROJEJUNOSTOMY, ESOPHAGOGASTROSCOPY;  Surgeon: Clovis Riley, MD;  Location: [place];  Service: General;  Laterality: N/A;  . LEFT ATRIAL APPENDAGE OCCLUSION  2018   placed  via groin  . LUMBAR FUSION  X 2  . REVERSE SHOULDER ARTHROPLASTY Left    reverse shoulder replacement  . ROUX-EN-Y GASTRIC BYPASS  2005  . ROUX-EN-Y PROCEDURE  20017 X 2   "revisions"  . TONSILLECTOMY AND ADENOIDECTOMY    . TOTAL KNEE ARTHROPLASTY Bilateral   . TOTAL SHOULDER ARTHROPLASTY Bilateral   . TUBAL LIGATION      Current Medications: Outpatient  Medications Prior to Visit  Medication Sig Dispense Refill  . acetaminophen (TYLENOL) 500 MG tablet Take 1,000 mg every 6 (six) hours as needed by mouth (pain).     . colchicine 0.6 MG tablet Take 0.6 mg 2 (two) times daily as needed by mouth (gout attacks).     . ferrous sulfate 325 (65 FE) MG tablet You can resume this in a couple weeks after your feeling better.  3  . flintstones complete (FLINTSTONES) 60 MG chewable tablet Chew 1 tablet 3 (three) times daily by mouth.    . gabapentin (NEURONTIN) 300 MG capsule Take 1 capsule (300 mg total) 3 (three) times daily by mouth. 90 capsule 3  . HYDROcodone-acetaminophen (NORCO/VICODIN) 5-325 MG tablet You can resume using this as instructed before;  when you are no longer taking plain Tylenol (acetaminophin) and Oxycodone. 30 tablet 0  . loperamide (IMODIUM) 2 MG capsule Take 2 mg 3 (three) times daily as needed by mouth for diarrhea or loose stools.    . mirabegron ER (MYRBETRIQ) 50 MG TB24 tablet Take 1 tablet (50 mg total) daily by mouth. 30 tablet 3  . oxyCODONE (OXY IR/ROXICODONE) 5 MG immediate release tablet You can use this for pain not relieved by the plain Tylenol you are taking every 6 hours.   You can take 1-2 tablets of Oxycodone every 6 hours as needed for pain not relieved by the plain Tylenol.    This is similar to your Vicodin, Vicodin has Tylenol and hydrocodone.  Do not take the Vicodin while your taking the Tylenol every 6 hours.    DO NOT TAKE MORE THAN 4000 MG OF TYLENOL PER DAY.  IT CAN HARM YOUR LIVER.  TYLENOL (ACETAMINOPHEN) I 40 tablet 0  . pantoprazole (PROTONIX) 40 MG tablet Take 1 tablet (40 mg total) by mouth 2 (two) times daily. 60 tablet 1  . rOPINIRole (REQUIP) 0.5 MG tablet Take 1 tablet (0.5 mg total) 3 (three) times daily by mouth. (Patient taking differently: Take 0.5-1 mg See admin instructions by mouth. Take 1 tablet (0.5 mg) in the afternoon & 2 tablets (1 mg) by mouth in the evening.) 30 tablet 3  . sucralfate  (CARAFATE) 1 GM/10ML suspension Take 1 g 4 (four) times daily by mouth.    . metoprolol tartrate (LOPRESSOR) 50 MG tablet Take 1 tablet (50 mg total) by mouth 2 (two) times daily. 60 tablet 0   No facility-administered medications prior to visit.      Allergies:   Demerol [meperidine]   Social History   Socioeconomic History  . Marital status: Widowed    Spouse name: None  . Number of children: None  . Years of education: None  . Highest education level: None  Social Needs  . Financial resource strain: None  . Food insecurity - worry: None  .  Food insecurity - inability: None  . Transportation needs - medical: None  . Transportation needs - non-medical: None  Occupational History  . None  Tobacco Use  . Smoking status: Never Smoker  . Smokeless tobacco: Never Used  Substance and Sexual Activity  . Alcohol use: No    Frequency: Never  . Drug use: No  . Sexual activity: Not Currently  Other Topics Concern  . None  Social History Narrative  . None     Family History:  The patient's family history includes Heart disease in her mother; Liver disease in her father; Prostate cancer in her brother; Ulcers in her father.   Review of Systems:   Please see the history of present illness.     General:  No chills, fever, night sweats or weight changes. Positive for fatigue.  Cardiovascular:  No chest pain, dyspnea on exertion, edema, orthopnea, palpitations, paroxysmal nocturnal dyspnea. Dermatological: No rash, lesions/masses Respiratory: No cough, dyspnea Urologic: No hematuria, dysuria Abdominal:   No nausea, vomiting, diarrhea, bright red blood per rectum, melena, or hematemesis Neurologic:  No visual changes, wkns, changes in mental status. All other systems reviewed and are otherwise negative except as noted above.   Physical Exam:    VS:  BP 113/78 (BP Location: Left Arm)   Pulse 90   Ht 4\' 11"  (1.499 m)   Wt 124 lb 6.4 oz (56.4 kg)   BMI 25.13 kg/m    General:  Well developed, elderly Caucasian female appearing in no acute distress. Head: Normocephalic, atraumatic, sclera non-icteric, no xanthomas, nares are without discharge.  Neck: No carotid bruits. JVD not elevated.  Lungs: Respirations regular and unlabored, without wheezes or rales.  Heart: Irregularly irregular. No S3 or S4.  No murmur, no rubs, or gallops appreciated. Abdomen: Soft, non-tender, non-distended with normoactive bowel sounds. No hepatomegaly. No rebound/guarding. No obvious abdominal masses. Msk:  Strength and tone appear normal for age. No joint deformities or effusions. Extremities: No clubbing or cyanosis. No lower extremity edema.  Distal pedal pulses are 2+ bilaterally. Neuro: Alert and oriented X 3. Moves all extremities spontaneously. No focal deficits noted. Psych:  Responds to questions appropriately with a normal affect. Skin: No rashes or lesions noted  Wt Readings from Last 3 Encounters:  12/14/16 124 lb 6.4 oz (56.4 kg)  11/21/16 115 lb (52.2 kg)  11/17/16 123 lb 3.2 oz (55.9 kg)     Studies/Labs Reviewed:   EKG:  EKG is not ordered today.   Recent Labs: 11/29/2016: ALT 14; Magnesium 2.0 11/30/2016: BUN 8; Creatinine, Ser 0.77; Hemoglobin 11.6; Platelets 467; Potassium 4.1; Sodium 141   Lipid Panel No results found for: CHOL, TRIG, HDL, CHOLHDL, VLDL, LDLCALC, LDLDIRECT  Additional studies/ records that were reviewed today include:   Echocardiogram: 11/30/2016 Study Conclusions  - Left ventricle: Diffuse hypokinesis worse in the mid and basal   inferior wall. The cavity size was mildly dilated. Wall thickness   was increased in a pattern of mild LVH. Systolic function was   moderately to severely reduced. The estimated ejection fraction   was in the range of 30% to 35%. The study is not technically   sufficient to allow evaluation of LV diastolic function. - Mitral valve: Calcified annulus. There was moderate   regurgitation. - Left atrium: The  atrium was moderately dilated. - Atrial septum: No defect or patent foramen ovale was identified.  Assessment:    1. Permanent atrial fibrillation ([place])   2. SSS (sick sinus syndrome) ([place])  3. S/P placement of cardiac pacemaker   4. Chronic systolic heart failure ([place])   5. Essential hypertension      Plan:   In order of problems listed above:  1. Chronic Atrial Fibrillation - the patient has a known history of atrial fibrillation and underwent placement of a Watchman device in 07/2016 as she has been unable to tolerate long-term anticoagulation due to recurrent GIB.  - recently admitted for a GIB and ASA was discontinued at that time. Was recommended to consider Plavix in the future once Hgb is stable. Discussed risks and benefits with the patient and with her recent GI surgery, will not restart antiplatelet therapy at this time. Can consider once further out from her recent event.  - she is currently on Lopressor 50mg  BID for rate-control. Will switch to Toprol-XL 100mg  daily in the setting of her cardiomyopathy.   2. SSS/ Pacemaker Placement - she underwent placement of a Medtronic PPM in 10/2015. She recently relocated from [place] and has an appointment with Dr. Curt Bears to establish care with EP.   3. Chronic Systolic CHF - echocardiogram during her recent admission showed a reduced EF of 30-35%. The patient denies any known history of CHF but no records are available for review.   - she denies any recent anginal symptoms and no evidence of volume overload by examination. 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  - will switch Lopressor to Toprol-XL. Consider addition of ACE-I or ARB at follow-up if BP allows. Once medication has been titrated, would consider a repeat echocardiogram in 3 months. If EF remains reduced, she will need an ischemic evaluation. We reviewed this today but with her recent  surgery, she wishes to try medication management initially and pursue further evaluation in the future if indicated. She does mention a possible hip surgery in the future and I recommended a NST for preoperative cardiac clearance if this is indeed pursued in the interim.    4. HTN - BP is well-controlled at 113/78 during today's visit. - continue with changes to Lopressor as outlined above.     Medication Adjustments/Labs and Tests Ordered: Current medicines are reviewed at length with the patient today.  Concerns regarding medicines are outlined above.  Medication changes, Labs and Tests ordered today are listed in the Patient Instructions below. Patient Instructions  Medication Instructions:  STOP METOPROLOL TARTRATE   START METOPROLOL SUCCINATE 100MG  DAILY  If you need a refill on your cardiac medications before your next appointment, please call your pharmacy.  Follow-Up: Your physician wants you to follow-up in: February 2019 [place].  Thank you for choosing [facility] at [employer], [address], [place]  12/14/2016 3:18 PM    [address], [place], [place]  [address] Phone: [phone]; Fax: [phone]  [address], [place], [place] [address] Phone: [phone]

## 2016-12-14 ENCOUNTER — Encounter: Payer: Self-pay | Admitting: Student

## 2016-12-14 ENCOUNTER — Ambulatory Visit (INDEPENDENT_AMBULATORY_CARE_PROVIDER_SITE_OTHER): Payer: Medicare Other | Admitting: Student

## 2016-12-14 VITALS — BP 113/78 | HR 90 | Ht 59.0 in | Wt 124.4 lb

## 2016-12-14 DIAGNOSIS — M069 Rheumatoid arthritis, unspecified: Secondary | ICD-10-CM | POA: Diagnosis not present

## 2016-12-14 DIAGNOSIS — M955 Acquired deformity of pelvis: Secondary | ICD-10-CM | POA: Diagnosis not present

## 2016-12-14 DIAGNOSIS — I482 Chronic atrial fibrillation: Secondary | ICD-10-CM

## 2016-12-14 DIAGNOSIS — Z48815 Encounter for surgical aftercare following surgery on the digestive system: Secondary | ICD-10-CM | POA: Diagnosis not present

## 2016-12-14 DIAGNOSIS — I495 Sick sinus syndrome: Secondary | ICD-10-CM | POA: Diagnosis not present

## 2016-12-14 DIAGNOSIS — I4821 Permanent atrial fibrillation: Secondary | ICD-10-CM

## 2016-12-14 DIAGNOSIS — I5022 Chronic systolic (congestive) heart failure: Secondary | ICD-10-CM

## 2016-12-14 DIAGNOSIS — Z950 Presence of cardiac pacemaker: Secondary | ICD-10-CM

## 2016-12-14 DIAGNOSIS — M160 Bilateral primary osteoarthritis of hip: Secondary | ICD-10-CM | POA: Diagnosis not present

## 2016-12-14 DIAGNOSIS — I10 Essential (primary) hypertension: Secondary | ICD-10-CM | POA: Diagnosis not present

## 2016-12-14 DIAGNOSIS — G629 Polyneuropathy, unspecified: Secondary | ICD-10-CM | POA: Diagnosis not present

## 2016-12-14 HISTORY — DX: Chronic systolic (congestive) heart failure: I50.22

## 2016-12-14 MED ORDER — METOPROLOL SUCCINATE ER 100 MG PO TB24: 100.0000 mg | ORAL_TABLET | Freq: Every day | ORAL | 1 refills | Status: DC

## 2016-12-14 NOTE — Patient Instructions (Signed)
Medication Instructions:  STOP METOPROLOL TARTRATE   START METOPROLOL SUCCINATE 100MG  DAILY  If you need a refill on your cardiac medications before your next appointment, please call your pharmacy.  Follow-Up: Your physician wants you to follow-up in: February 2019 [place].  Thank you for choosing [facility] at [facility]!!

## 2016-12-16 ENCOUNTER — Other Ambulatory Visit: Payer: Self-pay

## 2016-12-16 ENCOUNTER — Ambulatory Visit (INDEPENDENT_AMBULATORY_CARE_PROVIDER_SITE_OTHER): Payer: Medicare Other | Admitting: Family Medicine

## 2016-12-16 ENCOUNTER — Encounter: Payer: Self-pay | Admitting: Family Medicine

## 2016-12-16 VITALS — BP 130/76 | HR 117 | Temp 97.6°F | Ht 60.0 in | Wt 123.0 lb

## 2016-12-16 DIAGNOSIS — N3289 Other specified disorders of bladder: Secondary | ICD-10-CM

## 2016-12-16 DIAGNOSIS — Z48815 Encounter for surgical aftercare following surgery on the digestive system: Secondary | ICD-10-CM | POA: Diagnosis not present

## 2016-12-16 DIAGNOSIS — I482 Chronic atrial fibrillation: Secondary | ICD-10-CM | POA: Diagnosis not present

## 2016-12-16 DIAGNOSIS — G629 Polyneuropathy, unspecified: Secondary | ICD-10-CM | POA: Diagnosis not present

## 2016-12-16 DIAGNOSIS — Z8719 Personal history of other diseases of the digestive system: Secondary | ICD-10-CM | POA: Diagnosis not present

## 2016-12-16 DIAGNOSIS — R198 Other specified symptoms and signs involving the digestive system and abdomen: Secondary | ICD-10-CM

## 2016-12-16 DIAGNOSIS — M069 Rheumatoid arthritis, unspecified: Secondary | ICD-10-CM | POA: Diagnosis not present

## 2016-12-16 DIAGNOSIS — M955 Acquired deformity of pelvis: Secondary | ICD-10-CM | POA: Diagnosis not present

## 2016-12-16 DIAGNOSIS — I714 Abdominal aortic aneurysm, without rupture, unspecified: Secondary | ICD-10-CM

## 2016-12-16 DIAGNOSIS — M160 Bilateral primary osteoarthritis of hip: Secondary | ICD-10-CM | POA: Diagnosis not present

## 2016-12-16 MED ORDER — LEVOCETIRIZINE DIHYDROCHLORIDE 5 MG PO TABS: 5.0000 mg | ORAL_TABLET | Freq: Every evening | ORAL | 3 refills | Status: DC

## 2016-12-16 MED ORDER — SUCRALFATE 1 GM/10ML PO SUSP: 1.0000 g | Freq: Four times a day (QID) | ORAL | 3 refills | Status: DC

## 2016-12-16 MED ORDER — OXYBUTYNIN CHLORIDE 5 MG/5ML PO SYRP: 5.0000 mg | ORAL_SOLUTION | Freq: Two times a day (BID) | ORAL | Status: DC

## 2016-12-16 MED ORDER — FESOTERODINE FUMARATE ER 4 MG PO TB24: 4.0000 mg | ORAL_TABLET | Freq: Every day | ORAL | 3 refills | Status: DC

## 2016-12-16 NOTE — Patient Instructions (Addendum)
It was great to see you today! Thank you for giving me the opportunity to once again participate in your care!  Today, we discussed your recent gastric surgery to repair an ulcer in your stomach. I have read the very through discharge summary by Dr. Arnoldo Morale and appreciate his great care of you.   You had an incidental finding of a 3.5cm Abdominal Aortic Aneurysm. Surgical intervention is contraindicated at this time but we will make sure to watch it to see if it enlarges. If it does get bigger, surgery may be indicated to repair it.  Be well, Harolyn Rutherford, DO PGY-1, [facility]

## 2016-12-16 NOTE — Progress Notes (Signed)
Subjective: No chief complaint on file.    HPI: Deanna Spencer is a 76 y.o. presenting to clinic today to discuss the following:  1 Follow up for emergency repair of gastric perforation 2 Follow up for incidental finding of AAA 3 Medication changes  Today Deanna Spencer presents for follow up for her recent hospitalization for a gastric perforation. She had a long history of gastric ulcer bleeds and began having stomach pain that she went to the ED. She had successful surgery at [facility] and has no complaints today. She has some soreness but the incision is healing well, no erythema, no swelling, no discharge. She states she feels much better and is getting home PT/OT and is progressing well.  I discussed the incidental finding of AAA with the patient. Informed her at this time surgical intervention is not recommended but that we should get a rescan in one year to ensure it is not expanding. It measured 3.5cm.  She wants a medication for her spastic bladder but could not afford myrbetriq which I had prescribed earlier. I informed her that oxybutynin was not a good choice do to her age and its anticholinergic effects.    Health Maintenance: none today     ROS noted in HPI.   Past Medical, Surgical, Social, and Family History Reviewed & Updated per EMR.   Pertinent Historical Findings include:   Social History   Tobacco Use  Smoking Status Never Smoker  Smokeless Tobacco Never Used      Objective: BP 130/76   Pulse (!) 117   Temp 97.6 F (36.4 C) (Oral)   Ht 5' (1.524 m)   Wt 123 lb (55.8 kg)   SpO2 95%   BMI 24.02 kg/m  Vitals and nursing notes reviewed  Physical Exam  Constitutional: She is oriented to person, place, and time and well-developed, well-nourished, and in no distress. No distress.  HENT:  Head: Normocephalic and atraumatic.  Eyes: Conjunctivae and EOM are normal. Pupils are equal, round, and reactive to light.  Neck: Normal range of  motion. Neck supple. No tracheal deviation present. No thyromegaly present.  Cardiovascular: Normal rate and normal heart sounds.  No murmur heard. Irregularly irregular rhythm  Pulmonary/Chest: Effort normal and breath sounds normal. She has no wheezes. She has no rales.  Abdominal: Soft. Bowel sounds are normal. She exhibits no distension. There is tenderness. There is no rebound and no guarding.  Incision that is well healing, no erythema, no discharge, no swelling around site.  Musculoskeletal: Normal range of motion. She exhibits no edema.  Neurological: She is alert and oriented to person, place, and time.  Skin: Skin is warm and dry. No rash noted. No erythema.     No results found for this or any previous visit (from the past 72 hour(s)).  Assessment/Plan:  Spastic bladder Wrote prescription for xzyal 5mg  daily for spastic bladder due to less anticholinergic effects.  Perforated viscus Pt is recovering well from surgical repair of perforated gastric ulcer. She has no complaints, is eating well, having normal bowel movements and is progressing with home PT.  She is following up with surgery.  AAA (abdominal aortic aneurysm) without rupture (HCC) Incidental finding from CT of Abd and Pelvis during last admission for repair of perforated gastric ucler.   It is measuring 3.5cm which is below the surgical threshold of 5.5cm indicated for repair.   This needs to be watched, if it grows more than 0.5cm in  one year then surgery could be indicated and referral to vascular would be indicated.  Will arrange for her to have a CT scan in one year.     PATIENT EDUCATION PROVIDED: See AVS    Diagnosis and plan along with any newly prescribed medication(s) were discussed in detail with this patient today. The patient verbalized understanding and agreed with the plan. Patient advised if symptoms worsen return to clinic or ER.   Health Maintainance:   No orders of the defined types  were placed in this encounter.   Meds ordered this encounter  Medications  . DISCONTD: oxybutynin (DITROPAN) 5 MG/5ML syrup 5 mg  . sucralfate (CARAFATE) 1 GM/10ML suspension    Sig: Take 10 mLs (1 g total) by mouth 4 (four) times daily.    Dispense:  420 mL    Refill:  3  . levocetirizine (XYZAL) 5 MG tablet    Sig: Take 1 tablet (5 mg total) by mouth every evening.    Dispense:  30 tablet    Refill:  3  . fesoterodine (TOVIAZ) 4 MG TB24 tablet    Sig: Take 1 tablet (4 mg total) by mouth daily.    Dispense:  30 tablet    Refill:  [place], DO 12/16/2016, 3:07 PM PGY-1, [place]

## 2016-12-20 ENCOUNTER — Encounter: Payer: PRIVATE HEALTH INSURANCE | Admitting: Cardiology

## 2016-12-21 DIAGNOSIS — I714 Abdominal aortic aneurysm, without rupture, unspecified: Secondary | ICD-10-CM | POA: Insufficient documentation

## 2016-12-21 HISTORY — DX: Abdominal aortic aneurysm, without rupture: I71.4

## 2016-12-21 HISTORY — DX: Abdominal aortic aneurysm, without rupture, unspecified: I71.40

## 2016-12-21 NOTE — Assessment & Plan Note (Signed)
Incidental finding from CT of Abd and Pelvis during last admission for repair of perforated gastric ucler.   It is measuring 3.5cm which is below the surgical threshold of 5.5cm indicated for repair.   This needs to be watched, if it grows more than 0.5cm in one year then surgery could be indicated and referral to vascular would be indicated.  Will arrange for her to have a CT scan in one year.

## 2016-12-21 NOTE — Assessment & Plan Note (Signed)
Pt is recovering well from surgical repair of perforated gastric ulcer. She has no complaints, is eating well, having normal bowel movements and is progressing with home PT.  She is following up with surgery.

## 2016-12-21 NOTE — Assessment & Plan Note (Signed)
Wrote prescription for xzyal 5mg  daily for spastic bladder due to less anticholinergic effects.

## 2017-01-17 DIAGNOSIS — M1611 Unilateral primary osteoarthritis, right hip: Secondary | ICD-10-CM | POA: Diagnosis not present

## 2017-01-18 ENCOUNTER — Telehealth: Payer: Self-pay

## 2017-01-18 DIAGNOSIS — I429 Cardiomyopathy, unspecified: Secondary | ICD-10-CM

## 2017-01-18 NOTE — Telephone Encounter (Signed)
   [facility] Pre-operative Risk Assessment    Request for surgical clearance:  1. What type of surgery is being performed? Right total hip replacement  2. When is this surgery scheduled? 02/24/17  3. Are there any medications that need to be held prior to surgery and how long? none  4. Practice name and name of physician performing surgery? [place] [facility]  5. What is your office phone and fax number ? Phone # [phone]  Fax # [phone].  6. Anesthesia type (None, local, MAC, general) ? Karie Soda 01/18/2017, 5:49 PM  _________________________________________________________________   (provider comments below)

## 2017-01-19 NOTE — Telephone Encounter (Signed)
Per Dr. Stanford Breed, please schedule lexiscan stress test for Deanna Spencer for her preop clearance given her LV dysfunction.

## 2017-01-19 NOTE — Telephone Encounter (Signed)
I have called and discussed with patient, she denies any chest pain or SOB. She has PMH of permanent atrial fibrillation s/p Watchman LAA occlusive device, SSS s/p PPM, LV dysfunction of unknown duration and HTN. She had perforated viscus on Nov 2018 and underwent exploratory laparotomy and revision of gastrojejunostomy and esophagogastroscopy on 11/22/2016. She is now off ASA and plavix. Echo 11/30/2016 obtained on 11/30/2016 showed EF 30-35%, diffuse hypokinesis worse in the mid and basal inferior wall. Last seen by Bernerd Pho PA-C on 12/14/2016, she was doing well at the time. HR was 90.  Per Brittany's note, She does mention a possible hip surgery in the future and I recommended a NST for preoperative cardiac clearance if this is indeed pursued in the interim.  Note, patient was seen by Dr. Garlan Fillers on 12/16/2016, HR was 117 at the time. Per patient, she was rushing to get to the office that day.  Talking with the patient today, she denies any exertional SOB or chest pain recently, her right hip is giving her a lot of issue and she wish to have it fixed as soon as possible.   Will forward to Dr. Stanford Breed to if ok with schedule NST? Patient's appt with Dr. Stanford Breed is on 03/07/2017, after her R hip surgery on 02/24/2017. She is establishing with Dr. Curt Bears on 02/01/2017 for her PPM.  Signed, Almyra Deforest PA Pager: [phone]

## 2017-01-19 NOTE — Telephone Encounter (Signed)
Needs nuclear study preop given reduced LV function

## 2017-01-26 NOTE — Telephone Encounter (Signed)
Per DPR spoke with patients daughter [name] and she will inform patient that a scheduler will contact her to complete a stress test before medical clearance is given per Dr Stanford Breed. She voiced understanding and stated that was great. Message sent to scheduler.

## 2017-01-31 DIAGNOSIS — M1611 Unilateral primary osteoarthritis, right hip: Secondary | ICD-10-CM | POA: Diagnosis not present

## 2017-01-31 NOTE — Progress Notes (Deleted)
Electrophysiology Office Note   Date:  01/31/2017   ID:  Deanna, Spencer 1940-10-09, MRN [id number]  PCP:  Nuala Alpha, DO  Cardiologist:  Stanford Breed Primary Electrophysiologist:  Constance Haw, MD    No chief complaint on file.    History of Present Illness: Deanna Spencer is a 77 y.o. female who is being seen today for the evaluation of atrial fibrillation, pacemaker at the request of Kirk Ruths. Presenting today for electrophysiology evaluation.  She has a history of chronic atrial fibrillation status post watchman 07/2016, sick sinus syndrome status post Medtronic pacemaker, hypertension, hyperlipidemia.  She was admitted to the hospital 11/12 through 11/30/16 for abdominal pain, found to have a perforated viscus and underwent ex lap and revision of her gastrojejunostomy on 11/22/16.  She had an echocardiogram at discharge showed an EF of 30-35% with moderate mitral regurgitation.    Today, she denies*** symptoms of palpitations, chest pain, shortness of breath, orthopnea, PND, lower extremity edema, claudication, dizziness, presyncope, syncope, bleeding, or neurologic sequela. The patient is tolerating medications without difficulties.    Past Medical History:  Diagnosis Date  . Anemia   . Childhood asthma   . Chronic lower back pain   . Diverticulosis   . Duodenal ulcer   . Dyspnea   . Dysrhythmia    a fib  . GERD (gastroesophageal reflux disease)   . GI bleed    "have had both upper and lower GIB"  . Gout   . Heart attack ([place]) 2017  . Heart murmur   . History of blood transfusion 2017-2018   "related to bleeding ulcers; 47 units PRBC; 2U plasma" (11/23/2016)  . History of hiatal hernia   . Hx of blood clots    in heart  . IBS (irritable bowel syndrome)   . Osteoarthritis   . Osteoporosis   . Peripheral neuropathy   . 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 Raynaud disease   . Rheumatoid arteritis     . SSS (sick sinus syndrome) ([place])    a. s/p Medtronic PPM placement in 2017  . Stomach ulcer    Past Surgical History:  Procedure Laterality Date  . ABDOMINAL HYSTERECTOMY    . APPENDECTOMY    . CARPAL TUNNEL RELEASE Bilateral   . CATARACT EXTRACTION W/ INTRAOCULAR LENS  IMPLANT, BILATERAL Bilateral   . CHOLECYSTECTOMY OPEN    . DILATION AND CURETTAGE OF UTERUS    . FOOT FUSION Right   . INGUINAL HERNIA REPAIR Right X 2  . INSERT / REPLACE / REMOVE PACEMAKER  2017   medtronic  . JOINT REPLACEMENT    . LAPAROTOMY N/A 11/21/2016   Procedure: EXPLORATORY LAPAROTOMY, REVISION OF  GASTROJEJUNOSTOMY, ESOPHAGOGASTROSCOPY;  Surgeon: Clovis Riley, MD;  Location: [facility];  Service: General;  Laterality: N/A;  . LEFT ATRIAL APPENDAGE OCCLUSION  2018   placed  via groin  . LUMBAR FUSION  X 2  . REVERSE SHOULDER ARTHROPLASTY Left    reverse shoulder replacement  . ROUX-EN-Y GASTRIC BYPASS  2005  . ROUX-EN-Y PROCEDURE  20017 X 2   "revisions"  . TONSILLECTOMY AND ADENOIDECTOMY    . TOTAL KNEE ARTHROPLASTY Bilateral   . TOTAL SHOULDER ARTHROPLASTY Bilateral   . TUBAL LIGATION       Current Outpatient Medications  Medication Sig Dispense Refill  . acetaminophen (TYLENOL) 500 MG tablet Take 1,000 mg every 6 (six) hours as needed by mouth (pain).     Marland Kitchen  colchicine 0.6 MG tablet Take 0.6 mg 2 (two) times daily as needed by mouth (gout attacks).     . ferrous sulfate 325 (65 FE) MG tablet You can resume this in a couple weeks after your feeling better.  3  . fesoterodine (TOVIAZ) 4 MG TB24 tablet Take 1 tablet (4 mg total) by mouth daily. 30 tablet 3  . flintstones complete (FLINTSTONES) 60 MG chewable tablet Chew 1 tablet 3 (three) times daily by mouth.    . gabapentin (NEURONTIN) 300 MG capsule Take 1 capsule (300 mg total) 3 (three) times daily by mouth. 90 capsule 3  . HYDROcodone-acetaminophen (NORCO/VICODIN) 5-325 MG tablet You can resume using this as instructed before;  when you are  no longer taking plain Tylenol (acetaminophin) and Oxycodone. 30 tablet 0  . levocetirizine (XYZAL) 5 MG tablet Take 1 tablet (5 mg total) by mouth every evening. 30 tablet 3  . loperamide (IMODIUM) 2 MG capsule Take 2 mg 3 (three) times daily as needed by mouth for diarrhea or loose stools.    . metoprolol succinate (TOPROL-XL) 100 MG 24 hr tablet Take 1 tablet (100 mg total) by mouth daily. Take with or immediately following a meal. 90 tablet 1  . oxyCODONE (OXY IR/ROXICODONE) 5 MG immediate release tablet You can use this for pain not relieved by the plain Tylenol you are taking every 6 hours.   You can take 1-2 tablets of Oxycodone every 6 hours as needed for pain not relieved by the plain Tylenol.    This is similar to your Vicodin, Vicodin has Tylenol and hydrocodone.  Do not take the Vicodin while your taking the Tylenol every 6 hours.    DO NOT TAKE MORE THAN 4000 MG OF TYLENOL PER DAY.  IT CAN HARM YOUR LIVER.  TYLENOL (ACETAMINOPHEN) I 40 tablet 0  . pantoprazole (PROTONIX) 40 MG tablet Take 1 tablet (40 mg total) by mouth 2 (two) times daily. 60 tablet 1  . rOPINIRole (REQUIP) 0.5 MG tablet Take 1 tablet (0.5 mg total) 3 (three) times daily by mouth. (Patient taking differently: Take 0.5-1 mg See admin instructions by mouth. Take 1 tablet (0.5 mg) in the afternoon & 2 tablets (1 mg) by mouth in the evening.) 30 tablet 3  . sucralfate (CARAFATE) 1 GM/10ML suspension Take 10 mLs (1 g total) by mouth 4 (four) times daily. 420 mL 3   No current facility-administered medications for this visit.     Allergies:   Demerol [meperidine]   Social History:  The patient  reports that  has never smoked. she has never used smokeless tobacco. She reports that she does not drink alcohol or use drugs.   Family History:  The patient's family history includes Heart disease in her mother; Liver disease in her father; Prostate cancer in her brother; Ulcers in her father.    ROS:  Please see the history  of present illness.   Otherwise, review of systems is positive for ***.   All other systems are reviewed and negative.    PHYSICAL EXAM: VS:  There were no vitals taken for this visit. , BMI There is no height or weight on file to calculate BMI. GEN: Well nourished, well developed, in no acute distress  HEENT: normal  Neck: no JVD, carotid bruits, or masses Cardiac: ***RRR; no murmurs, rubs, or gallops,no edema  Respiratory:  clear to auscultation bilaterally, normal work of breathing GI: soft, nontender, nondistended, + BS MS: no deformity or atrophy  Skin:  warm and dry, device pocket is well healed Neuro:  Strength and sensation are intact Psych: euthymic mood, full affect  EKG:  EKG {ACTION; IS/IS JIR:67893810} ordered today. Personal review of the ekg ordered shows ***  Device interrogation is reviewed today in detail.  See PaceArt for details.   Recent Labs: 11/29/2016: ALT 14; Magnesium 2.0 11/30/2016: BUN 8; Creatinine, Ser 0.77; Hemoglobin 11.6; Platelets 467; Potassium 4.1; Sodium 141    Lipid Panel  No results found for: CHOL, TRIG, HDL, CHOLHDL, VLDL, LDLCALC, LDLDIRECT   Wt Readings from Last 3 Encounters:  12/16/16 123 lb (55.8 kg)  12/14/16 124 lb 6.4 oz (56.4 kg)  11/21/16 115 lb (52.2 kg)      Other studies Reviewed: Additional studies/ records that were reviewed today include: TTE 11/30/16  Review of the above records today demonstrates:  - Left ventricle: Diffuse hypokinesis worse in the mid and basal   inferior wall. The cavity size was mildly dilated. Wall thickness   was increased in a pattern of mild LVH. Systolic function was   moderately to severely reduced. The estimated ejection fraction   was in the range of 30% to 35%. The study is not technically   sufficient to allow evaluation of LV diastolic function. - Mitral valve: Calcified annulus. There was moderate   regurgitation. - Left atrium: The atrium was moderately dilated. - Atrial  septum: No defect or patent foramen ovale was identified.   ASSESSMENT AND PLAN:  1.  Chronic atrial fibrillation: Status post watchman and not on anticoagulation.  This patients CHA2DS2-VASc Score and unadjusted Ischemic Stroke Rate (% per year) is equal to {CVA risk based on CHA2DS2 Vasc Score:430000501}  Above score calculated as 1 point each if present [CHF, HTN, DM, Vascular=MI/PAD/Aortic Plaque, Age if 65-74, or Female] Above score calculated as 2 points each if present [Age > 75, or Stroke/TIA/TE]  2.  Sick sinus syndrome: Status post Medtronic pacemaker 10/2015.***  3.  Systolic heart failure, unknown duration: Since admission to the hospital in November.  Has had no heart failure type symptoms.  Awaiting 37-month follow-up echo before discussion of ICD therapy.***  4.  Hypertension:***  Current medicines are reviewed at length with the patient today.   The patient {ACTIONS; HAS/DOES NOT HAVE:19233} concerns regarding her medicines.  The following changes were made today:  {NONE DEFAULTED:18576::"none"}  Labs/ tests ordered today include: *** No orders of the defined types were placed in this encounter.    Disposition:   FU with Deardra Hinkley {gen number [id number]:[id number]} {Days to years:10300}  Signed, Vergene Marland Meredith Leeds, MD  01/31/2017 12:02 PM     [place] [address] [phone] (office) [phone] (fax)

## 2017-02-01 ENCOUNTER — Telehealth (HOSPITAL_COMMUNITY): Payer: Self-pay

## 2017-02-01 ENCOUNTER — Encounter: Payer: Medicare Other | Admitting: Cardiology

## 2017-02-01 NOTE — Telephone Encounter (Signed)
Encounter complete. 

## 2017-02-03 ENCOUNTER — Ambulatory Visit (HOSPITAL_COMMUNITY)
Admission: RE | Admit: 2017-02-03 | Discharge: 2017-02-03 | Disposition: A | Discharge status: Home / Self Care | Payer: Medicare Other | Source: Ambulatory Visit | Attending: Cardiology | Admitting: Cardiology

## 2017-02-03 DIAGNOSIS — I4891 Unspecified atrial fibrillation: Secondary | ICD-10-CM | POA: Diagnosis not present

## 2017-02-03 DIAGNOSIS — I429 Cardiomyopathy, unspecified: Secondary | ICD-10-CM

## 2017-02-03 LAB — MYOCARDIAL PERFUSION IMAGING
Peak HR: 98 {beats}/min
Rest HR: 79 {beats}/min
SDS: 4
SRS: 2
SSS: 6
TID: 1.26

## 2017-02-03 MED ORDER — TECHNETIUM TC 99M TETROFOSMIN IV KIT
9.6000 | PACK | Freq: Once | INTRAVENOUS | Status: AC | PRN
  Administered 2017-02-03: 9.6 via INTRAVENOUS
  Filled 2017-02-03: qty 10

## 2017-02-03 MED ORDER — TECHNETIUM TC 99M TETROFOSMIN IV KIT
31.6000 | PACK | Freq: Once | INTRAVENOUS | Status: AC | PRN
  Administered 2017-02-03: 31.6 via INTRAVENOUS
  Filled 2017-02-03: qty 32

## 2017-02-03 MED ORDER — REGADENOSON 0.4 MG/5ML IV SOLN
0.4000 mg | Freq: Once | INTRAVENOUS | Status: AC
  Administered 2017-02-03: 0.4 mg via INTRAVENOUS

## 2017-02-07 ENCOUNTER — Telehealth: Payer: Self-pay | Admitting: *Deleted

## 2017-02-07 NOTE — Telephone Encounter (Signed)
Ok for surgery Deanna Spencer  

## 2017-02-07 NOTE — Telephone Encounter (Signed)
   Primary Cardiologist:Brian Stanford Breed, MD  Chart reviewed as part of pre-operative protocol coverage. Stress test is normal.  The patient not on any antiplatelet or anticoagulation therapy due to GIB.   Can you comment on clearance?   [place], [place]  02/07/2017, 2:07 PM

## 2017-02-07 NOTE — Telephone Encounter (Signed)
   I will route this recommendation to the requesting party via Epic fax function and remove from pre-op pool.  Please call with questions.  [place], [place] 02/07/2017, 2:43 PM

## 2017-02-07 NOTE — Telephone Encounter (Signed)
   [facility] Pre-operative Risk Assessment    Request for surgical clearance:  1. What type of surgery is being performed? RIGHT TOTAL HIP REPLACEMENT   2. When is this surgery scheduled? 02/24/17   3. What type of clearance is required (medical clearance vs. Pharmacy clearance to hold med vs. Both)? MEDICAL  4. Are there any medications that need to be held prior to surgery and how long?NONE   5. Practice name and name of physician performing surgery? DANIEL CAFFREY MD   6. What is your office phone and fax number? PH= [phone] EXT [phone]  [phone] ATTN KELLY   7. Anesthesia type (None, local, MAC, general) ? UNKNOWN 8. NUCLEAR STRESS TEST COMPLETED 02-03-17   Fredia Beets 02/07/2017, 1:57 PM  _________________________________________________________________   (provider comments below)

## 2017-02-08 ENCOUNTER — Ambulatory Visit: Payer: Self-pay | Admitting: Physician Assistant

## 2017-02-08 NOTE — H&P (View-Only) (Signed)
TOTAL HIP ADMISSION H&P  Patient is admitted for right total hip arthroplasty.  Subjective:  Chief Complaint: right hip pain  HPI: Deanna Spencer, 77 y.o. female, has a history of pain and functional disability in the right hip(s) due to arthritis and patient has failed non-surgical conservative treatments for greater than 12 weeks to include corticosteriod injections, use of assistive devices and activity modification.  Onset of symptoms was gradual starting >10 years ago with gradually worsening course since that time.The patient noted no past surgery on the right hip(s).  Patient currently rates pain in the right hip at 10 out of 10 with activity. Patient has night pain, worsening of pain with activity and weight bearing, pain that interfers with activities of daily living and pain with passive range of motion. Patient has evidence of periarticular osteophytes and joint space narrowing by imaging studies. This condition presents safety issues increasing the risk of falls. There is no current active infection.  Patient Active Problem List   Diagnosis Date Noted  . AAA (abdominal aortic aneurysm) without rupture ([place]) 12/21/2016  . Chronic systolic heart failure ([place]) 12/14/2016  . S/P exploratory laparotomy 11/22/2016  . Perforated viscus 11/22/2016  . Atrial fibrillation ([place]) 11/22/2016  . Peripheral neuropathy 11/22/2016  . GERD (gastroesophageal reflux disease) 11/22/2016  . Other chronic pain 11/18/2016  . 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 History of heart attack 11/18/2016  . Parasomnia 11/18/2016  . Need for immunization against influenza 11/18/2016  . Encounter to establish care 11/18/2016  . History of duodenal ulcer 11/18/2016   Past Medical History:  Diagnosis Date  . Anemia   . Childhood asthma   . Chronic lower back pain   . Diverticulosis   . Duodenal ulcer   . Dyspnea   . Dysrhythmia    a fib  . GERD (gastroesophageal  reflux disease)   . GI bleed    "have had both upper and lower GIB"  . Gout   . Heart attack ([place]) 2017  . Heart murmur   . History of blood transfusion 2017-2018   "related to bleeding ulcers; 47 units PRBC; 2U plasma" (11/23/2016)  . History of hiatal hernia   . Hx of blood clots    in heart  . IBS (irritable bowel syndrome)   . Osteoarthritis   . Osteoporosis   . Peripheral neuropathy   . 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 Raynaud disease   . Rheumatoid arteritis   . SSS (sick sinus syndrome) ([place])    a. s/p Medtronic PPM placement in 2017  . Stomach ulcer     Past Surgical History:  Procedure Laterality Date  . ABDOMINAL HYSTERECTOMY    . APPENDECTOMY    . CARPAL TUNNEL RELEASE Bilateral   . CATARACT EXTRACTION W/ INTRAOCULAR LENS  IMPLANT, BILATERAL Bilateral   . CHOLECYSTECTOMY OPEN    . DILATION AND CURETTAGE OF UTERUS    . FOOT FUSION Right   . INGUINAL HERNIA REPAIR Right X 2  . INSERT / REPLACE / REMOVE PACEMAKER  2017   medtronic  . JOINT REPLACEMENT    . LAPAROTOMY N/A 11/21/2016   Procedure: EXPLORATORY LAPAROTOMY, REVISION OF  GASTROJEJUNOSTOMY, ESOPHAGOGASTROSCOPY;  Surgeon: Clovis Riley, MD;  Location: [facility];  Service: General;  Laterality: N/A;  . LEFT ATRIAL APPENDAGE OCCLUSION  2018   placed  via groin  . LUMBAR FUSION  X 2  .  REVERSE SHOULDER ARTHROPLASTY Left    reverse shoulder replacement  . ROUX-EN-Y GASTRIC BYPASS  2005  . ROUX-EN-Y PROCEDURE  20017 X 2   "revisions"  . TONSILLECTOMY AND ADENOIDECTOMY    . TOTAL KNEE ARTHROPLASTY Bilateral   . TOTAL SHOULDER ARTHROPLASTY Bilateral   . TUBAL LIGATION      Current Outpatient Medications  Medication Sig Dispense Refill Last Dose  . acetaminophen (TYLENOL) 500 MG tablet Take 1,000 mg every 6 (six) hours as needed by mouth (pain).    11/21/2016 at 2030  . colchicine 0.6 MG tablet Take 0.6 mg 2 (two) times daily as needed by mouth (gout  attacks).    2 months ago  . ferrous sulfate 325 (65 FE) MG tablet You can resume this in a couple weeks after your feeling better. (Patient taking differently: Take 325 mg by mouth daily. )  3   . fesoterodine (TOVIAZ) 4 MG TB24 tablet Take 1 tablet (4 mg total) by mouth daily. 30 tablet 3   . gabapentin (NEURONTIN) 300 MG capsule Take 1 capsule (300 mg total) 3 (three) times daily by mouth. 90 capsule 3 11/20/2016 at pm  . HYDROcodone-acetaminophen (NORCO/VICODIN) 5-325 MG tablet You can resume using this as instructed before;  when you are no longer taking plain Tylenol (acetaminophin) and Oxycodone. (Patient taking differently: Take 1 tablet by mouth 2 (two) times daily as needed (for pain.). You can resume using this as instructed before;  when you are no longer taking plain Tylenol (acetaminophin) and Oxycodone.) 30 tablet 0   . levocetirizine (XYZAL) 5 MG tablet Take 1 tablet (5 mg total) by mouth every evening. 30 tablet 3   . loperamide (IMODIUM) 2 MG capsule Take 2 mg 3 (three) times daily as needed by mouth for diarrhea or loose stools.   few days ago  . metoprolol succinate (TOPROL-XL) 100 MG 24 hr tablet Take 1 tablet (100 mg total) by mouth daily. Take with or immediately following a meal. (Patient taking differently: Take 100 mg by mouth every evening. Take with or immediately following a meal.) 90 tablet 1   . oxyCODONE (OXY IR/ROXICODONE) 5 MG immediate release tablet You can use this for pain not relieved by the plain Tylenol you are taking every 6 hours.   You can take 1-2 tablets of Oxycodone every 6 hours as needed for pain not relieved by the plain Tylenol.    This is similar to your Vicodin, Vicodin has Tylenol and hydrocodone.  Do not take the Vicodin while your taking the Tylenol every 6 hours.    DO NOT TAKE MORE THAN 4000 MG OF TYLENOL PER DAY.  IT CAN HARM YOUR LIVER.  TYLENOL (ACETAMINOPHEN) I (Patient taking differently: Take 5 mg by mouth 2 (two) times daily as needed (for  pain). You can use this for pain not relieved by the plain Tylenol you are taking every 6 hours.   You can take 1-2 tablets of Oxycodone every 6 hours as needed for pain not relieved by the plain Tylenol.    This is similar to your Vicodin, Vicodin has Tylenol and hydrocodone.  Do not take the Vicodin while your taking the Tylenol every 6 hours.    DO NOT TAKE MORE THAN 4000 MG OF TYLENOL PER DAY.  IT CAN HARM YOUR LIVER.  TYLENOL (ACETAMINOPHEN) I) 40 tablet 0   . pantoprazole (PROTONIX) 40 MG tablet Take 1 tablet (40 mg total) by mouth 2 (two) times daily. (Patient taking differently: Take  40 mg by mouth 2 (two) times daily as needed (for acid reflux/indigestion.). ) 60 tablet 1   . rOPINIRole (REQUIP) 0.5 MG tablet Take 1 tablet (0.5 mg total) 3 (three) times daily by mouth. (Patient taking differently: Take 0.5-1 mg by mouth 2 (two) times daily. Take 1 tablet (0.5 mg) in the afternoon & 2 tablets (1 mg) by mouth in the evening.) 30 tablet 3 11/21/2016 at 2030  . sucralfate (CARAFATE) 1 GM/10ML suspension Take 10 mLs (1 g total) by mouth 4 (four) times daily. 420 mL 3    No current facility-administered medications for this visit.    Allergies  Allergen Reactions  . Demerol [Meperidine] Anaphylaxis    Social History   Tobacco Use  . Smoking status: Never Smoker  . Smokeless tobacco: Never Used  Substance Use Topics  . Alcohol use: No    Frequency: Never    Family History  Problem Relation Age of Onset  . Heart disease Mother   . Liver disease Father   . Ulcers Father   . Prostate cancer Brother   . Colon cancer Neg Hx      Review of Systems  Musculoskeletal: Positive for joint pain.  All other systems reviewed and are negative.   Objective:  Physical Exam  Constitutional: She is oriented to person, place, and time. Vital signs are normal. She is active. No distress.  HENT:  Head: Normocephalic and atraumatic.  Nose: Nose normal.  Eyes: Conjunctivae and EOM are normal.  Pupils are equal, round, and reactive to light.  Neck: Normal range of motion. Neck supple.  Cardiovascular: Normal rate, normal heart sounds and intact distal pulses. An irregularly irregular rhythm present.  Respiratory: Effort normal and breath sounds normal. No respiratory distress. She has no wheezes.  GI: Soft. Bowel sounds are normal. She exhibits no distension. There is no tenderness.  Musculoskeletal:       Right hip: She exhibits decreased range of motion and tenderness.  Lymphadenopathy:    She has no cervical adenopathy.  Neurological: She is alert and oriented to person, place, and time. No cranial nerve deficit.  Skin: Skin is warm and dry. No rash noted. No erythema.  Psychiatric: She has a normal mood and affect. Her behavior is normal.    Vital signs in last 24 hours: @VSRANGES @  Labs:   Estimated body mass index is 24.02 kg/m as calculated from the following:   Height as of 02/03/17: 5' (1.524 m).   Weight as of 02/03/17: 55.8 kg (123 lb).   Imaging Review Plain radiographs demonstrate severe degenerative joint disease of the right hip(s). The bone quality appears to be good for age and reported activity level.  Assessment/Plan:  End stage arthritis, right hip(s)  The patient history, physical examination, clinical judgement of the provider and imaging studies are consistent with end stage degenerative joint disease of the right hip(s) and total hip arthroplasty is deemed medically necessary. The treatment options including medical management, injection therapy, arthroscopy and arthroplasty were discussed at length. The risks and benefits of total hip arthroplasty were presented and reviewed. The risks due to aseptic loosening, infection, stiffness, dislocation/subluxation,  thromboembolic complications and other imponderables were discussed.  The patient acknowledged the explanation, agreed to proceed with the plan and consent was signed. Patient is being admitted  for inpatient treatment for surgery, pain control, PT, OT, prophylactic antibiotics, VTE prophylaxis, progressive ambulation and ADL's and discharge planning.The patient is planning to be discharged home with home health services

## 2017-02-08 NOTE — H&P (Signed)
TOTAL HIP ADMISSION H&P  Patient is admitted for right total hip arthroplasty.  Subjective:  Chief Complaint: right hip pain  HPI: Deanna Spencer, 77 y.o. female, has a history of pain and functional disability in the right hip(s) due to arthritis and patient has failed non-surgical conservative treatments for greater than 12 weeks to include corticosteriod injections, use of assistive devices and activity modification.  Onset of symptoms was gradual starting >10 years ago with gradually worsening course since that time.The patient noted no past surgery on the right hip(s).  Patient currently rates pain in the right hip at 10 out of 10 with activity. Patient has night pain, worsening of pain with activity and weight bearing, pain that interfers with activities of daily living and pain with passive range of motion. Patient has evidence of periarticular osteophytes and joint space narrowing by imaging studies. This condition presents safety issues increasing the risk of falls. There is no current active infection.  Patient Active Problem List   Diagnosis Date Noted  . AAA (abdominal aortic aneurysm) without rupture ([place]) 12/21/2016  . Chronic systolic heart failure ([place]) 12/14/2016  . S/P exploratory laparotomy 11/22/2016  . Perforated viscus 11/22/2016  . Atrial fibrillation ([place]) 11/22/2016  . Peripheral neuropathy 11/22/2016  . GERD (gastroesophageal reflux disease) 11/22/2016  . Other chronic pain 11/18/2016  . 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 History of heart attack 11/18/2016  . Parasomnia 11/18/2016  . Need for immunization against influenza 11/18/2016  . Encounter to establish care 11/18/2016  . History of duodenal ulcer 11/18/2016   Past Medical History:  Diagnosis Date  . Anemia   . Childhood asthma   . Chronic lower back pain   . Diverticulosis   . Duodenal ulcer   . Dyspnea   . Dysrhythmia    a fib  . GERD (gastroesophageal  reflux disease)   . GI bleed    "have had both upper and lower GIB"  . Gout   . Heart attack ([place]) 2017  . Heart murmur   . History of blood transfusion 2017-2018   "related to bleeding ulcers; 47 units PRBC; 2U plasma" (11/23/2016)  . History of hiatal hernia   . Hx of blood clots    in heart  . IBS (irritable bowel syndrome)   . Osteoarthritis   . Osteoporosis   . Peripheral neuropathy   . 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 Raynaud disease   . Rheumatoid arteritis   . SSS (sick sinus syndrome) ([place])    a. s/p Medtronic PPM placement in 2017  . Stomach ulcer     Past Surgical History:  Procedure Laterality Date  . ABDOMINAL HYSTERECTOMY    . APPENDECTOMY    . CARPAL TUNNEL RELEASE Bilateral   . CATARACT EXTRACTION W/ INTRAOCULAR LENS  IMPLANT, BILATERAL Bilateral   . CHOLECYSTECTOMY OPEN    . DILATION AND CURETTAGE OF UTERUS    . FOOT FUSION Right   . INGUINAL HERNIA REPAIR Right X 2  . INSERT / REPLACE / REMOVE PACEMAKER  2017   medtronic  . JOINT REPLACEMENT    . LAPAROTOMY N/A 11/21/2016   Procedure: EXPLORATORY LAPAROTOMY, REVISION OF  GASTROJEJUNOSTOMY, ESOPHAGOGASTROSCOPY;  Surgeon: Clovis Riley, MD;  Location: [facility];  Service: General;  Laterality: N/A;  . LEFT ATRIAL APPENDAGE OCCLUSION  2018   placed  via groin  . LUMBAR FUSION  X 2  .  REVERSE SHOULDER ARTHROPLASTY Left    reverse shoulder replacement  . ROUX-EN-Y GASTRIC BYPASS  2005  . ROUX-EN-Y PROCEDURE  20017 X 2   "revisions"  . TONSILLECTOMY AND ADENOIDECTOMY    . TOTAL KNEE ARTHROPLASTY Bilateral   . TOTAL SHOULDER ARTHROPLASTY Bilateral   . TUBAL LIGATION      Current Outpatient Medications  Medication Sig Dispense Refill Last Dose  . acetaminophen (TYLENOL) 500 MG tablet Take 1,000 mg every 6 (six) hours as needed by mouth (pain).    11/21/2016 at 2030  . colchicine 0.6 MG tablet Take 0.6 mg 2 (two) times daily as needed by mouth (gout  attacks).    2 months ago  . ferrous sulfate 325 (65 FE) MG tablet You can resume this in a couple weeks after your feeling better. (Patient taking differently: Take 325 mg by mouth daily. )  3   . fesoterodine (TOVIAZ) 4 MG TB24 tablet Take 1 tablet (4 mg total) by mouth daily. 30 tablet 3   . gabapentin (NEURONTIN) 300 MG capsule Take 1 capsule (300 mg total) 3 (three) times daily by mouth. 90 capsule 3 11/20/2016 at pm  . HYDROcodone-acetaminophen (NORCO/VICODIN) 5-325 MG tablet You can resume using this as instructed before;  when you are no longer taking plain Tylenol (acetaminophin) and Oxycodone. (Patient taking differently: Take 1 tablet by mouth 2 (two) times daily as needed (for pain.). You can resume using this as instructed before;  when you are no longer taking plain Tylenol (acetaminophin) and Oxycodone.) 30 tablet 0   . levocetirizine (XYZAL) 5 MG tablet Take 1 tablet (5 mg total) by mouth every evening. 30 tablet 3   . loperamide (IMODIUM) 2 MG capsule Take 2 mg 3 (three) times daily as needed by mouth for diarrhea or loose stools.   few days ago  . metoprolol succinate (TOPROL-XL) 100 MG 24 hr tablet Take 1 tablet (100 mg total) by mouth daily. Take with or immediately following a meal. (Patient taking differently: Take 100 mg by mouth every evening. Take with or immediately following a meal.) 90 tablet 1   . oxyCODONE (OXY IR/ROXICODONE) 5 MG immediate release tablet You can use this for pain not relieved by the plain Tylenol you are taking every 6 hours.   You can take 1-2 tablets of Oxycodone every 6 hours as needed for pain not relieved by the plain Tylenol.    This is similar to your Vicodin, Vicodin has Tylenol and hydrocodone.  Do not take the Vicodin while your taking the Tylenol every 6 hours.    DO NOT TAKE MORE THAN 4000 MG OF TYLENOL PER DAY.  IT CAN HARM YOUR LIVER.  TYLENOL (ACETAMINOPHEN) I (Patient taking differently: Take 5 mg by mouth 2 (two) times daily as needed (for  pain). You can use this for pain not relieved by the plain Tylenol you are taking every 6 hours.   You can take 1-2 tablets of Oxycodone every 6 hours as needed for pain not relieved by the plain Tylenol.    This is similar to your Vicodin, Vicodin has Tylenol and hydrocodone.  Do not take the Vicodin while your taking the Tylenol every 6 hours.    DO NOT TAKE MORE THAN 4000 MG OF TYLENOL PER DAY.  IT CAN HARM YOUR LIVER.  TYLENOL (ACETAMINOPHEN) I) 40 tablet 0   . pantoprazole (PROTONIX) 40 MG tablet Take 1 tablet (40 mg total) by mouth 2 (two) times daily. (Patient taking differently: Take  40 mg by mouth 2 (two) times daily as needed (for acid reflux/indigestion.). ) 60 tablet 1   . rOPINIRole (REQUIP) 0.5 MG tablet Take 1 tablet (0.5 mg total) 3 (three) times daily by mouth. (Patient taking differently: Take 0.5-1 mg by mouth 2 (two) times daily. Take 1 tablet (0.5 mg) in the afternoon & 2 tablets (1 mg) by mouth in the evening.) 30 tablet 3 11/21/2016 at 2030  . sucralfate (CARAFATE) 1 GM/10ML suspension Take 10 mLs (1 g total) by mouth 4 (four) times daily. 420 mL 3    No current facility-administered medications for this visit.    Allergies  Allergen Reactions  . Demerol [Meperidine] Anaphylaxis    Social History   Tobacco Use  . Smoking status: Never Smoker  . Smokeless tobacco: Never Used  Substance Use Topics  . Alcohol use: No    Frequency: Never    Family History  Problem Relation Age of Onset  . Heart disease Mother   . Liver disease Father   . Ulcers Father   . Prostate cancer Brother   . Colon cancer Neg Hx      Review of Systems  Musculoskeletal: Positive for joint pain.  All other systems reviewed and are negative.   Objective:  Physical Exam  Constitutional: She is oriented to person, place, and time. Vital signs are normal. She is active. No distress.  HENT:  Head: Normocephalic and atraumatic.  Nose: Nose normal.  Eyes: Conjunctivae and EOM are normal.  Pupils are equal, round, and reactive to light.  Neck: Normal range of motion. Neck supple.  Cardiovascular: Normal rate, normal heart sounds and intact distal pulses. An irregularly irregular rhythm present.  Respiratory: Effort normal and breath sounds normal. No respiratory distress. She has no wheezes.  GI: Soft. Bowel sounds are normal. She exhibits no distension. There is no tenderness.  Musculoskeletal:       Right hip: She exhibits decreased range of motion and tenderness.  Lymphadenopathy:    She has no cervical adenopathy.  Neurological: She is alert and oriented to person, place, and time. No cranial nerve deficit.  Skin: Skin is warm and dry. No rash noted. No erythema.  Psychiatric: She has a normal mood and affect. Her behavior is normal.    Vital signs in last 24 hours: @VSRANGES @  Labs:   Estimated body mass index is 24.02 kg/m as calculated from the following:   Height as of 02/03/17: 5' (1.524 m).   Weight as of 02/03/17: 55.8 kg (123 lb).   Imaging Review Plain radiographs demonstrate severe degenerative joint disease of the right hip(s). The bone quality appears to be good for age and reported activity level.  Assessment/Plan:  End stage arthritis, right hip(s)  The patient history, physical examination, clinical judgement of the provider and imaging studies are consistent with end stage degenerative joint disease of the right hip(s) and total hip arthroplasty is deemed medically necessary. The treatment options including medical management, injection therapy, arthroscopy and arthroplasty were discussed at length. The risks and benefits of total hip arthroplasty were presented and reviewed. The risks due to aseptic loosening, infection, stiffness, dislocation/subluxation,  thromboembolic complications and other imponderables were discussed.  The patient acknowledged the explanation, agreed to proceed with the plan and consent was signed. Patient is being admitted  for inpatient treatment for surgery, pain control, PT, OT, prophylactic antibiotics, VTE prophylaxis, progressive ambulation and ADL's and discharge planning.The patient is planning to be discharged home with home health services

## 2017-02-10 ENCOUNTER — Telehealth: Payer: Self-pay

## 2017-02-10 ENCOUNTER — Encounter (HOSPITAL_COMMUNITY)
Admission: RE | Admit: 2017-02-10 | Discharge: 2017-02-10 | Disposition: A | Discharge status: Home / Self Care | Payer: Medicare Other | Source: Ambulatory Visit | Attending: Orthopedic Surgery | Admitting: Orthopedic Surgery

## 2017-02-10 ENCOUNTER — Encounter (HOSPITAL_COMMUNITY): Payer: Self-pay

## 2017-02-10 ENCOUNTER — Other Ambulatory Visit (HOSPITAL_COMMUNITY): Payer: Self-pay | Admitting: *Deleted

## 2017-02-10 ENCOUNTER — Other Ambulatory Visit: Payer: Self-pay

## 2017-02-10 DIAGNOSIS — Z01812 Encounter for preprocedural laboratory examination: Secondary | ICD-10-CM | POA: Insufficient documentation

## 2017-02-10 DIAGNOSIS — Z950 Presence of cardiac pacemaker: Secondary | ICD-10-CM | POA: Diagnosis not present

## 2017-02-10 DIAGNOSIS — I4891 Unspecified atrial fibrillation: Secondary | ICD-10-CM | POA: Diagnosis not present

## 2017-02-10 DIAGNOSIS — Z01810 Encounter for preprocedural cardiovascular examination: Secondary | ICD-10-CM | POA: Diagnosis not present

## 2017-02-10 DIAGNOSIS — Z79899 Other long term (current) drug therapy: Secondary | ICD-10-CM | POA: Diagnosis not present

## 2017-02-10 HISTORY — DX: Polyneuropathy, unspecified: G62.9

## 2017-02-10 HISTORY — DX: Presence of other cardiac implants and grafts: Z95.818

## 2017-02-10 HISTORY — DX: Abdominal aortic aneurysm, without rupture: I71.4

## 2017-02-10 LAB — URINALYSIS, MICROSCOPIC (REFLEX)

## 2017-02-10 LAB — COMPREHENSIVE METABOLIC PANEL
ALT: 13 U/L — ABNORMAL LOW (ref 14–54)
AST: 21 U/L (ref 15–41)
Albumin: 3.6 g/dL (ref 3.5–5.0)
Alkaline Phosphatase: 63 U/L (ref 38–126)
Anion gap: 11 (ref 5–15)
BILIRUBIN TOTAL: 0.3 mg/dL (ref 0.3–1.2)
BUN: 21 mg/dL — AB (ref 6–20)
CHLORIDE: 113 mmol/L — AB (ref 101–111)
CO2: 19 mmol/L — ABNORMAL LOW (ref 22–32)
Calcium: 9.3 mg/dL (ref 8.9–10.3)
Creatinine, Ser: 0.94 mg/dL (ref 0.44–1.00)
GFR, EST NON AFRICAN AMERICAN: 58 mL/min — AB (ref >60)
Glucose, Bld: 92 mg/dL (ref 65–99)
Potassium: 4 mmol/L (ref 3.5–5.1)
Sodium: 143 mmol/L (ref 135–145)
TOTAL PROTEIN: 6.6 g/dL (ref 6.5–8.1)

## 2017-02-10 LAB — CBC WITH DIFFERENTIAL/PLATELET
Basophils Absolute: 0 10*3/uL (ref 0.0–0.1)
Basophils Relative: 1 %
Eosinophils Absolute: 0.2 10*3/uL (ref 0.0–0.7)
Eosinophils Relative: 4 %
HEMATOCRIT: 40.1 % (ref 36.0–46.0)
Hemoglobin: 12.7 g/dL (ref 12.0–15.0)
LYMPHS ABS: 1.6 10*3/uL (ref 0.7–4.0)
LYMPHS PCT: 29 %
MCH: 31.5 pg (ref 26.0–34.0)
MCHC: 31.7 g/dL (ref 30.0–36.0)
MCV: 99.5 fL (ref 78.0–100.0)
MONO ABS: 0.5 10*3/uL (ref 0.1–1.0)
MONOS PCT: 8 %
Neutro Abs: 3.3 10*3/uL (ref 1.7–7.7)
Neutrophils Relative %: 58 %
PLATELETS: 335 10*3/uL (ref 150–400)
RBC: 4.03 MIL/uL (ref 3.87–5.11)
RDW: 16.1 % — AB (ref 11.5–15.5)
WBC: 5.6 10*3/uL (ref 4.0–10.5)

## 2017-02-10 LAB — URINALYSIS, ROUTINE W REFLEX MICROSCOPIC
Bilirubin Urine: NEGATIVE
GLUCOSE, UA: NEGATIVE mg/dL
Ketones, ur: NEGATIVE mg/dL
NITRITE: NEGATIVE
PH: 5.5 (ref 5.0–8.0)
Protein, ur: NEGATIVE mg/dL
SPECIFIC GRAVITY, URINE: 1.025 (ref 1.005–1.030)

## 2017-02-10 LAB — SURGICAL PCR SCREEN
MRSA, PCR: NEGATIVE
Staphylococcus aureus: POSITIVE — AB

## 2017-02-10 LAB — TYPE AND SCREEN
ABO/RH(D): A POS
ANTIBODY SCREEN: NEGATIVE

## 2017-02-10 LAB — PROTIME-INR
INR: 1
PROTHROMBIN TIME: 13.1 s (ref 11.4–15.2)

## 2017-02-10 LAB — APTT: aPTT: 33 seconds (ref 24–36)

## 2017-02-10 NOTE — Progress Notes (Signed)
Pt has hx of A-fib with pacermaker and Watchman device. Pt has cardiac clearance from Dr. Stanford Breed who has seen her when she was in the hospital in November. Pt denies chest pain or sob.

## 2017-02-10 NOTE — Progress Notes (Signed)
Mupirocin Ointment Rx called into [facility] on [address] and [address] for positive PCR of Staph. Pt notified and voiced understanding.

## 2017-02-10 NOTE — Pre-Procedure Instructions (Signed)
Deanna Spencer  02/10/2017    Your procedure is scheduled on Friday, February 24, 2017 at 10:15 AM.   Report to [facility] Entrance "A" Admitting Office at 8:15 AM.   Call this number if you have problems the morning of surgery: [phone]   Questions prior to day of surgery, please call [phone] between 8 & 4 PM.   Remember:  Do not eat food or drink liquids after midnight Thursday, 02/23/17.  Take these medicines the morning of surgery with A SIP OF WATER: Gabapentin (Neurontin), Pantoprazole (Protonix), Ropinirole (Requip), Oxycodone or Tylenol - if needed  Do not use Aspirin products or NSAIDS (Ibuprofen, Aleve, etc) 7 days prior to surgery.   Do not wear jewelry, make-up or nail polish.  Do not wear lotions, powders, perfumes or deodorant.  Do not shave 48 hours prior to surgery.   Do not bring valuables to the hospital.  [facility] is not responsible for any belongings or valuables.  Contacts, dentures or bridgework may not be worn into surgery.  Leave your suitcase in the car.  After surgery it may be brought to your room.  For patients admitted to the hospital, discharge time will be determined by your treatment team.  [facility] - Preparing for Surgery  Before surgery, you can play an important role.  Because skin is not sterile, your skin needs to be as free of germs as possible.  You can reduce the number of germs on you skin by washing with CHG (chlorahexidine gluconate) soap before surgery.  CHG is an antiseptic cleaner which kills germs and bonds with the skin to continue killing germs even after washing.  Please DO NOT use if you have an allergy to CHG or antibacterial soaps.  If your skin becomes reddened/irritated stop using the CHG and inform your nurse when you arrive at Short Stay.  Do not shave (including legs and underarms) for at least 48 hours prior to the first CHG shower.  You may shave your face.  Please follow these instructions  carefully:   1.  Shower with CHG Soap the night before surgery and the                    morning of Surgery.  2.  If you choose to wash your hair, wash your hair first as usual with your       normal shampoo.  3.  After you shampoo, rinse your hair and body thoroughly to remove the shampoo.  4.  Use CHG as you would any other liquid soap.  You can apply chg directly       to the skin and wash gently with scrungie or a clean washcloth.  5.  Apply the CHG Soap to your body ONLY FROM THE NECK DOWN.        Do not use on open wounds or open sores.  Avoid contact with your eyes, ears, mouth and genitals (private parts).  Wash genitals (private parts) with your normal soap.  6.  Wash thoroughly, paying special attention to the area where your surgery        will be performed.  7.  Thoroughly rinse your body with warm water from the neck down.  8.  DO NOT shower/wash with your normal soap after using and rinsing off       the CHG Soap.  9.  Pat yourself dry with a clean towel.  10.  Wear clean pajamas.            11.  Place clean sheets on your bed the night of your first shower and do not        sleep with pets.  Day of Surgery  Shower as above. Do not apply any lotions/deodorants the morning of surgery.  Please wear clean clothes to the hospital.   Please read over the fact sheets that you were given.

## 2017-02-10 NOTE — Telephone Encounter (Signed)
Spoke with patient's daughter per DPR. Requested name of previous clinic in [place] that managed mother's PPM. Daughter will call back when patient is awake and able to find name of clinic.

## 2017-02-11 LAB — URINE CULTURE: Culture: <10000 — AB

## 2017-02-14 ENCOUNTER — Encounter (HOSPITAL_COMMUNITY): Payer: Self-pay

## 2017-02-14 NOTE — Progress Notes (Addendum)
Anesthesia Chart Review:  Pt is a 77 year old female scheduled for R total hip arthroplasty on 02/24/2017 with Earlie Server, MD  - PCP is Nuala Alpha, DO at the [facility] is Kirk Ruths, MD who cleared pt for surgery 02/07/17  PMH includes:  Atrial fibrillation (has watchman device), sick sinus syndrome, pacemaker (Medtronic), AAA (3.5cm by CT 11/29/16), Raynaud disease, anemia, RA, hx GI bleed. Never smoker. BMI 26. S/p exploratory laparotomy, revision of gastrojejunostomy 11/22/16. Previous hx roux-en-y gastric bypass.  - Hospitalized 11/12 - 11/30/16 for perforated viscus (s/p revision 11/22/16)   Medications include: iron, metoprolol, protonix  BP (!) 119/91   Pulse 77   Temp 36.4 C   Resp 18   Ht 4\' 11"  (1.499 m)   Wt 129 lb 11.2 oz (58.8 kg)   SpO2 99%   BMI 26.20 kg/m   Preoperative labs reviewed.    EKG 11/21/16:  - Atrial fibrillation (113 bpm). RBBB. Inferior infarct, age indeterminate - EKG done in the ED in the setting of perforation of viscus.   Nuclear stress test 02/03/17:   The study is normal.  This is a low risk study.  The patient was in atrial fibrillation so gating was not possible. Therefore, we were not able to obtain any information regarding left ventricular function.  Echo 11/30/16:  - Left ventricle: Diffuse hypokinesis worse in the mid and basal inferior wall. The cavity size was mildly dilated. Wall thickness was increased in a pattern of mild LVH. Systolic function was moderately to severely reduced. The estimated ejection fraction was in the range of 30% to 35%. The study is not technically sufficient to allow evaluation of LV diastolic function. - Mitral valve: Calcified annulus. There was moderate regurgitation. - Left atrium: The atrium was moderately dilated. - Atrial septum: No defect or patent foramen ovale was identified.  CT abdomen/pelvis 11/29/16:  - Stable hiatal hernia. - Postoperative  changes are seen involving the stomach, with surgical drain present entering right lower quadrant of the abdomen and passing under left hepatic lobe, with distal tip in left upper quadrant. No abnormal fluid collection is noted. - Moderate anasarca. - Stable intrahepatic and extrahepatic biliary dilatation is noted most consistent with post cholecystectomy status. - Stable 3.5 cm infrarenal abdominal aortic aneurysm. Recommend followup by ultrasound in 2 years.  - 1.3 cm enhancing abnormality is noted in left hepatic lobe which may represent hemangioma. Nonemergent evaluation with MRI when patient can follow breathing instructions and hold still for prolonged amount of time is recommended to rule out other pathology.  Perioperative prescription form for pacemaker pending.   If no changes, I anticipate pt can proceed with surgery as scheduled.   Willeen Cass, FNP-BC [facility]/Anesthesiology Phone: [phone] 02/14/2017 10:44 AM

## 2017-02-17 ENCOUNTER — Other Ambulatory Visit: Payer: Self-pay | Admitting: Orthopedic Surgery

## 2017-02-21 ENCOUNTER — Other Ambulatory Visit: Payer: Self-pay | Admitting: Family Medicine

## 2017-02-21 MED ORDER — SUCRALFATE 1 G PO TABS: 1.0000 g | ORAL_TABLET | Freq: Three times a day (TID) | ORAL | 3 refills | Status: DC

## 2017-02-21 NOTE — Progress Notes (Signed)
Changing carafate from liquid to pill form. Giving 90 day supply b/c pill form in this supply is covered better by her insurance coverage.

## 2017-02-21 NOTE — H&P (Signed)
TOTAL HIP ADMISSION H&P  Patient is admitted for right total hip arthroplasty.  Subjective:  Chief Complaint: right hip pain  HPI: Deanna Spencer, 77 y.o. female, has a history of pain and functional disability in the right hip(s) due to arthritis and patient has failed non-surgical conservative treatments for greater than 12 weeks to include use of assistive devices and activity modification.  Onset of symptoms was gradual starting several years ago with gradually worsening course since that time.The patient noted no past surgery on the right hip(s).  Patient currently rates pain in the right hip at 10 out of 10 with activity. Patient has night pain, worsening of pain with activity and weight bearing, pain that interfers with activities of daily living and pain with passive range of motion. Patient has evidence of joint space narrowing by imaging studies. This condition presents safety issues increasing the risk of falls.   There is no current active infection.  Patient Active Problem List   Diagnosis Date Noted  . AAA (abdominal aortic aneurysm) without rupture ([place]) 12/21/2016  . Chronic systolic heart failure ([place]) 12/14/2016  . S/P exploratory laparotomy 11/22/2016  . Perforated viscus 11/22/2016  . Atrial fibrillation ([place]) 11/22/2016  . Peripheral neuropathy 11/22/2016  . GERD (gastroesophageal reflux disease) 11/22/2016  . Other chronic pain 11/18/2016  . 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 History of heart attack 11/18/2016  . Parasomnia 11/18/2016  . Need for immunization against influenza 11/18/2016  . Encounter to establish care 11/18/2016  . History of duodenal ulcer 11/18/2016   Past Medical History:  Diagnosis Date  . AAA (abdominal aortic aneurysm) ([place])    3.5cm by CT 11/29/16  . Anemia   . Childhood asthma    connected to allergies  . Chronic lower back pain   . Diverticulosis   . Duodenal ulcer   . Dyspnea   .  Dysrhythmia    a fib  . GERD (gastroesophageal reflux disease)   . GI bleed    "have had both upper and lower GIB"  . Gout   . Heart attack ([place]) 2017  . Heart murmur   . History of blood transfusion 2017-2018   "related to bleeding ulcers; 47 units PRBC; 2U plasma" (11/23/2016)  . History of hiatal hernia   . Hx of blood clots    in heart  . IBS (irritable bowel syndrome)   . Neuropathy   . Osteoarthritis   . Osteoporosis   . Peripheral neuropathy   . 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 Presence of Watchman left atrial appendage closure device   . Raynaud disease   . Rheumatoid arteritis   . SSS (sick sinus syndrome) ([place])    a. s/p Medtronic PPM placement in 2017  . Stomach ulcer     Past Surgical History:  Procedure Laterality Date  . ABDOMINAL HYSTERECTOMY    . APPENDECTOMY    . CARPAL TUNNEL RELEASE Bilateral   . CATARACT EXTRACTION W/ INTRAOCULAR LENS  IMPLANT, BILATERAL Bilateral   . CHOLECYSTECTOMY OPEN    . DILATION AND CURETTAGE OF UTERUS    . FOOT FUSION Right   . INGUINAL HERNIA REPAIR Right X 2  . INSERT / REPLACE / REMOVE PACEMAKER  2017   medtronic  . JOINT REPLACEMENT    . LAPAROTOMY N/A 11/21/2016   Procedure: EXPLORATORY LAPAROTOMY, REVISION OF  GASTROJEJUNOSTOMY, ESOPHAGOGASTROSCOPY;  Surgeon: Clovis Riley, MD;  Location: [facility] OR;  Service: General;  Laterality: N/A;  . LEFT ATRIAL APPENDAGE OCCLUSION  2018   placed  via groin  . LUMBAR FUSION  X 2  . REVERSE SHOULDER ARTHROPLASTY Left    reverse shoulder replacement  . ROUX-EN-Y GASTRIC BYPASS  2005  . ROUX-EN-Y PROCEDURE  20017 X 2   "revisions"  . TONSILLECTOMY AND ADENOIDECTOMY    . TOTAL KNEE ARTHROPLASTY Bilateral   . TOTAL SHOULDER ARTHROPLASTY Bilateral   . TUBAL LIGATION      No current facility-administered medications for this encounter.    Current Outpatient Medications  Medication Sig Dispense Refill Last Dose  . acetaminophen  (TYLENOL) 500 MG tablet Take 1,000 mg every 6 (six) hours as needed by mouth (pain).    11/21/2016 at 2030  . colchicine 0.6 MG tablet Take 0.6 mg 2 (two) times daily as needed by mouth (gout attacks).    2 months ago  . ferrous sulfate 325 (65 FE) MG tablet You can resume this in a couple weeks after your feeling better. (Patient taking differently: Take 325 mg by mouth daily. )  3   . fesoterodine (TOVIAZ) 4 MG TB24 tablet Take 1 tablet (4 mg total) by mouth daily. 30 tablet 3   . gabapentin (NEURONTIN) 300 MG capsule Take 1 capsule (300 mg total) 3 (three) times daily by mouth. 90 capsule 3 11/20/2016 at pm  . HYDROcodone-acetaminophen (NORCO/VICODIN) 5-325 MG tablet You can resume using this as instructed before;  when you are no longer taking plain Tylenol (acetaminophin) and Oxycodone. (Patient taking differently: Take 1 tablet by mouth 2 (two) times daily as needed (for pain.). You can resume using this as instructed before;  when you are no longer taking plain Tylenol (acetaminophin) and Oxycodone.) 30 tablet 0   . levocetirizine (XYZAL) 5 MG tablet Take 1 tablet (5 mg total) by mouth every evening. 30 tablet 3   . loperamide (IMODIUM) 2 MG capsule Take 2 mg 3 (three) times daily as needed by mouth for diarrhea or loose stools.   few days ago  . metoprolol succinate (TOPROL-XL) 100 MG 24 hr tablet Take 1 tablet (100 mg total) by mouth daily. Take with or immediately following a meal. (Patient taking differently: Take 100 mg by mouth every evening. Take with or immediately following a meal.) 90 tablet 1   . oxyCODONE (OXY IR/ROXICODONE) 5 MG immediate release tablet You can use this for pain not relieved by the plain Tylenol you are taking every 6 hours.   You can take 1-2 tablets of Oxycodone every 6 hours as needed for pain not relieved by the plain Tylenol.    This is similar to your Vicodin, Vicodin has Tylenol and hydrocodone.  Do not take the Vicodin while your taking the Tylenol every 6  hours.    DO NOT TAKE MORE THAN 4000 MG OF TYLENOL PER DAY.  IT CAN HARM YOUR LIVER.  TYLENOL (ACETAMINOPHEN) I (Patient taking differently: Take 5 mg by mouth 2 (two) times daily as needed (for pain). You can use this for pain not relieved by the plain Tylenol you are taking every 6 hours.   You can take 1-2 tablets of Oxycodone every 6 hours as needed for pain not relieved by the plain Tylenol.    This is similar to your Vicodin, Vicodin has Tylenol and hydrocodone.  Do not take the Vicodin while your taking the Tylenol every 6 hours.    DO NOT TAKE MORE THAN 4000  MG OF TYLENOL PER DAY.  IT CAN HARM YOUR LIVER.  TYLENOL (ACETAMINOPHEN) I) 40 tablet 0   . pantoprazole (PROTONIX) 40 MG tablet Take 1 tablet (40 mg total) by mouth 2 (two) times daily. (Patient taking differently: Take 40 mg by mouth 2 (two) times daily as needed (for acid reflux/indigestion.). ) 60 tablet 1   . rOPINIRole (REQUIP) 0.5 MG tablet Take 1 tablet (0.5 mg total) 3 (three) times daily by mouth. (Patient taking differently: Take 0.5-1 mg by mouth 2 (two) times daily. Take 1 tablet (0.5 mg) in the afternoon & 2 tablets (1 mg) by mouth in the evening.) 30 tablet 3 11/21/2016 at 2030  . sucralfate (CARAFATE) 1 GM/10ML suspension Take 10 mLs (1 g total) by mouth 4 (four) times daily. 420 mL 3    Allergies  Allergen Reactions  . Demerol [Meperidine] Anaphylaxis    Social History   Tobacco Use  . Smoking status: Never Smoker  . Smokeless tobacco: Never Used  Substance Use Topics  . Alcohol use: No    Frequency: Never    Comment: very rare    Family History  Problem Relation Age of Onset  . Heart disease Mother   . Liver disease Father   . Ulcers Father   . Prostate cancer Brother   . Colon cancer Neg Hx      Review of Systems  Constitutional: Negative.   HENT: Negative.   Eyes: Negative.   Respiratory: Negative.   Cardiovascular:       HTN and Heart attack  Gastrointestinal:       HX of stomach ulcer   Genitourinary: Negative.   Musculoskeletal: Positive for joint pain.  Skin: Negative.   Neurological: Negative.   Endo/Heme/Allergies:       Blood clots  Psychiatric/Behavioral: Negative.     Objective:  Physical Exam  Constitutional: She is oriented to person, place, and time. She appears well-developed and well-nourished.  HENT:  Head: Normocephalic and atraumatic.  Eyes: Pupils are equal, round, and reactive to light.  Neck: Normal range of motion. Neck supple.  Cardiovascular: Intact distal pulses.  Respiratory: Effort normal.  Musculoskeletal: She exhibits tenderness.  Examination of the right lower extremity again shows she is neurovascularly intact.  She has intact dorsiflexion and plantarflexion with the ankle.  There is no calf tenderness to palpation.  She has flexion of the hip to 80-90 degrees.  Has severe pain with any attempts at rotation passively.    Neurological: She is alert and oriented to person, place, and time.  Skin: Skin is warm and dry.  Psychiatric: She has a normal mood and affect. Her behavior is normal. Judgment and thought content normal.    Vital signs in last 24 hours:    Labs:   Estimated body mass index is 26.2 kg/m as calculated from the following:   Height as of 02/10/17: 4\' 11"  (1.499 m).   Weight as of 02/10/17: 58.8 kg (129 lb 11.2 oz).   Imaging Review Plain radiographs of the hip and pelvis show the left hip to be in reasonably good shape.  The right hip definitely shows superior migration of the head and some mild shortening.  She does have good medial wall on her pelvis.  She does not have any evidence of protrusio, though certainly there is a little superior migration.    Assessment/Plan:  End stage arthritis, right hip(s)  The patient history, physical examination, clinical judgement of the provider and imaging studies are consistent  with end stage degenerative joint disease of the right hip(s) and total hip arthroplasty is  deemed medically necessary. The treatment options including medical management, injection therapy, arthroscopy and arthroplasty were discussed at length. The risks and benefits of total hip arthroplasty were presented and reviewed. The risks due to aseptic loosening, infection, stiffness, dislocation/subluxation,  thromboembolic complications and other imponderables were discussed.  The patient acknowledged the explanation, agreed to proceed with the plan and consent was signed. Patient is being admitted for inpatient treatment for surgery, pain control, PT, OT, prophylactic antibiotics, VTE prophylaxis, progressive ambulation and ADL's and discharge planning.The patient is planning to be discharged home with home health services

## 2017-02-22 NOTE — Progress Notes (Deleted)
HPI: FU chronic atrial fibrillation (s/p Watchman device in 07/2016), SSS (s/p Medtronic PPM placement).   Admitted to [facility] 11/18 with abdominal pain, found to have a perforated viscus and underwent exploratory laparotomy and revision of her gastrojejunostomy on 11/22/2016. Cardiology was consulted given her history of chronic atrial fibrillation and her being on ASA prior to admission. Given her recurrent bleeding and recent gastric surgery, ASA was discontinued during admission. Consideration of starting Plavix as an outpatient was recommended. She was noted to have elevated HR following her surgery, therefore she was started on Lopressor 25mg  BID which was further titrated to 50mg  BID. An echocardiogram showed EF of 30-35% with moderate MR and moderate LAE. Nuclear study 1/19 showed normal perfusion; not gated. Abd CT 11/18 showed 3.5 cm AAA; 1.3 abnormality left hepatic lobe; MRI recommended. CT showed sclerotic lesion left fifth rib and bone scan recommended if h/o malignancy. Since last seen,      Current Outpatient Medications  Medication Sig Dispense Refill  . acetaminophen (TYLENOL) 500 MG tablet Take 1,000 mg every 6 (six) hours as needed by mouth (pain).     . colchicine 0.6 MG tablet Take 0.6 mg 2 (two) times daily as needed by mouth (gout attacks).     . ferrous sulfate 325 (65 FE) MG tablet You can resume this in a couple weeks after your feeling better. (Patient taking differently: Take 325 mg by mouth daily. )  3  . fesoterodine (TOVIAZ) 4 MG TB24 tablet Take 1 tablet (4 mg total) by mouth daily. 30 tablet 3  . gabapentin (NEURONTIN) 300 MG capsule Take 1 capsule (300 mg total) 3 (three) times daily by mouth. 90 capsule 3  . HYDROcodone-acetaminophen (NORCO/VICODIN) 5-325 MG tablet You can resume using this as instructed before;  when you are no longer taking plain Tylenol (acetaminophin) and Oxycodone. (Patient taking differently: Take 1 tablet by mouth 2 (two) times  daily as needed (for pain.). You can resume using this as instructed before;  when you are no longer taking plain Tylenol (acetaminophin) and Oxycodone.) 30 tablet 0  . levocetirizine (XYZAL) 5 MG tablet Take 1 tablet (5 mg total) by mouth every evening. 30 tablet 3  . loperamide (IMODIUM) 2 MG capsule Take 2 mg 3 (three) times daily as needed by mouth for diarrhea or loose stools.    . metoprolol succinate (TOPROL-XL) 100 MG 24 hr tablet Take 1 tablet (100 mg total) by mouth daily. Take with or immediately following a meal. (Patient taking differently: Take 100 mg by mouth every evening. Take with or immediately following a meal.) 90 tablet 1  . oxyCODONE (OXY IR/ROXICODONE) 5 MG immediate release tablet You can use this for pain not relieved by the plain Tylenol you are taking every 6 hours.   You can take 1-2 tablets of Oxycodone every 6 hours as needed for pain not relieved by the plain Tylenol.    This is similar to your Vicodin, Vicodin has Tylenol and hydrocodone.  Do not take the Vicodin while your taking the Tylenol every 6 hours.    DO NOT TAKE MORE THAN 4000 MG OF TYLENOL PER DAY.  IT CAN HARM YOUR LIVER.  TYLENOL (ACETAMINOPHEN) I (Patient taking differently: Take 5 mg by mouth 2 (two) times daily as needed (for pain). You can use this for pain not relieved by the plain Tylenol you are taking every 6 hours.   You can take 1-2 tablets of Oxycodone every 6 hours  as needed for pain not relieved by the plain Tylenol.    This is similar to your Vicodin, Vicodin has Tylenol and hydrocodone.  Do not take the Vicodin while your taking the Tylenol every 6 hours.    DO NOT TAKE MORE THAN 4000 MG OF TYLENOL PER DAY.  IT CAN HARM YOUR LIVER.  TYLENOL (ACETAMINOPHEN) I) 40 tablet 0  . pantoprazole (PROTONIX) 40 MG tablet Take 1 tablet (40 mg total) by mouth 2 (two) times daily. (Patient taking differently: Take 40 mg by mouth 2 (two) times daily as needed (for acid reflux/indigestion.). ) 60 tablet 1    . rOPINIRole (REQUIP) 0.5 MG tablet Take 1 tablet (0.5 mg total) 3 (three) times daily by mouth. (Patient taking differently: Take 0.5-1 mg by mouth 2 (two) times daily. Take 1 tablet (0.5 mg) in the afternoon & 2 tablets (1 mg) by mouth in the evening.) 30 tablet 3  . sucralfate (CARAFATE) 1 g tablet Take 1 tablet (1 g total) by mouth 4 (four) times daily -  with meals and at bedtime. 90 tablet 3   No current facility-administered medications for this visit.      Past Medical History:  Diagnosis Date  . AAA (abdominal aortic aneurysm) ([place])    3.5cm by CT 11/29/16  . Anemia   . Childhood asthma    connected to allergies  . Chronic lower back pain   . Diverticulosis   . Duodenal ulcer   . Dyspnea   . Dysrhythmia    a fib  . GERD (gastroesophageal reflux disease)   . GI bleed    "have had both upper and lower GIB"  . Gout   . Heart attack ([place]) 2017  . Heart murmur   . History of blood transfusion 2017-2018   "related to bleeding ulcers; 47 units PRBC; 2U plasma" (11/23/2016)  . History of hiatal hernia   . Hx of blood clots    in heart  . IBS (irritable bowel syndrome)   . Neuropathy   . Osteoarthritis   . Osteoporosis   . Peripheral neuropathy   . 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 Presence of Watchman left atrial appendage closure device   . Raynaud disease   . Rheumatoid arteritis   . SSS (sick sinus syndrome) ([place])    a. s/p Medtronic PPM placement in 2017  . Stomach ulcer     Past Surgical History:  Procedure Laterality Date  . ABDOMINAL HYSTERECTOMY    . APPENDECTOMY    . CARPAL TUNNEL RELEASE Bilateral   . CATARACT EXTRACTION W/ INTRAOCULAR LENS  IMPLANT, BILATERAL Bilateral   . CHOLECYSTECTOMY OPEN    . DILATION AND CURETTAGE OF UTERUS    . FOOT FUSION Right   . INGUINAL HERNIA REPAIR Right X 2  . INSERT / REPLACE / REMOVE PACEMAKER  2017   medtronic  . JOINT REPLACEMENT    . LAPAROTOMY N/A  11/21/2016   Procedure: EXPLORATORY LAPAROTOMY, REVISION OF  GASTROJEJUNOSTOMY, ESOPHAGOGASTROSCOPY;  Surgeon: Clovis Riley, MD;  Location: [place];  Service: General;  Laterality: N/A;  . LEFT ATRIAL APPENDAGE OCCLUSION  2018   placed  via groin  . LUMBAR FUSION  X 2  . REVERSE SHOULDER ARTHROPLASTY Left    reverse shoulder replacement  . ROUX-EN-Y GASTRIC BYPASS  2005  . ROUX-EN-Y PROCEDURE  20017 X 2   "revisions"  . TONSILLECTOMY AND ADENOIDECTOMY    . TOTAL KNEE  ARTHROPLASTY Bilateral   . TOTAL SHOULDER ARTHROPLASTY Bilateral   . TUBAL LIGATION      Social History   Socioeconomic History  . Marital status: Widowed    Spouse name: Not on file  . Number of children: Not on file  . Years of education: Not on file  . Highest education level: Not on file  Social Needs  . Financial resource strain: Not on file  . Food insecurity - worry: Not on file  . Food insecurity - inability: Not on file  . Transportation needs - medical: Not on file  . Transportation needs - non-medical: Not on file  Occupational History  . Not on file  Tobacco Use  . Smoking status: Never Smoker  . Smokeless tobacco: Never Used  Substance and Sexual Activity  . Alcohol use: No    Frequency: Never    Comment: very rare  . Drug use: No  . Sexual activity: Not Currently  Other Topics Concern  . Not on file  Social History Narrative  . Not on file    Family History  Problem Relation Age of Onset  . Heart disease Mother   . Liver disease Father   . Ulcers Father   . Prostate cancer Brother   . Colon cancer Neg Hx     ROS: no fevers or chills, productive cough, hemoptysis, dysphasia, odynophagia, melena, hematochezia, dysuria, hematuria, rash, seizure activity, orthopnea, PND, pedal edema, claudication. Remaining systems are negative.  Physical Exam: Well-developed well-nourished in no acute distress.  Skin is warm and dry.  HEENT is normal.  Neck is supple.  Chest is clear to  auscultation with normal expansion.  Cardiovascular exam is regular rate and rhythm.  Abdominal exam nontender or distended. No masses palpated. Extremities show no edema. neuro grossly intact  ECG- personally reviewed  A/P  1 permanent atrial fibrillation-patient was unable to tolerate anticoagulation in the past.  She has had a previous watchman device placed.  2 prior pacemaker placement-  Kirk Ruths, MD

## 2017-02-23 MED ORDER — SODIUM CHLORIDE 0.9 % IV SOLN
2000.0000 mg | INTRAVENOUS | Status: AC
  Administered 2017-02-24: 2000 mg via TOPICAL
  Filled 2017-02-23: qty 20

## 2017-02-23 MED ORDER — SODIUM CHLORIDE 0.9 % IV SOLN: INTRAVENOUS | Status: DC

## 2017-02-23 MED ORDER — BUPIVACAINE LIPOSOME 1.3 % IJ SUSP
20.0000 mL | INTRAMUSCULAR | Status: AC
  Administered 2017-02-24: 20 mL
  Filled 2017-02-23: qty 20

## 2017-02-23 MED ORDER — CEFAZOLIN SODIUM-DEXTROSE 2-4 GM/100ML-% IV SOLN
2.0000 g | INTRAVENOUS | Status: AC
  Administered 2017-02-24: 2 g via INTRAVENOUS
  Filled 2017-02-23: qty 100

## 2017-02-23 MED ORDER — TRANEXAMIC ACID 1000 MG/10ML IV SOLN
1000.0000 mg | INTRAVENOUS | Status: AC
  Administered 2017-02-24: 1000 mg via INTRAVENOUS
  Filled 2017-02-23: qty 1100

## 2017-02-23 NOTE — Progress Notes (Signed)
Tomi Bamberger medtronic rep notified of patient's surgery tomorrow 02/24/17. Tomi Bamberger will come to interrogate device.

## 2017-02-24 ENCOUNTER — Inpatient Hospital Stay (HOSPITAL_COMMUNITY)
Admission: RE | Admit: 2017-02-24 | Discharge: 2017-02-26 | DRG: 470 | Disposition: A | Discharge status: Home Health | Payer: Medicare Other | Source: Ambulatory Visit | Attending: Orthopedic Surgery | Admitting: Orthopedic Surgery

## 2017-02-24 ENCOUNTER — Encounter (HOSPITAL_COMMUNITY)
Admission: RE | Disposition: A | Discharge status: Home Health | Payer: Self-pay | Source: Ambulatory Visit | Attending: Orthopedic Surgery

## 2017-02-24 ENCOUNTER — Encounter (HOSPITAL_COMMUNITY): Payer: Self-pay

## 2017-02-24 ENCOUNTER — Inpatient Hospital Stay (HOSPITAL_COMMUNITY): Payer: Medicare Other

## 2017-02-24 ENCOUNTER — Inpatient Hospital Stay (HOSPITAL_COMMUNITY): Payer: Medicare Other | Admitting: Anesthesiology

## 2017-02-24 ENCOUNTER — Inpatient Hospital Stay (HOSPITAL_COMMUNITY): Payer: Medicare Other | Admitting: Emergency Medicine

## 2017-02-24 ENCOUNTER — Other Ambulatory Visit: Payer: Self-pay

## 2017-02-24 DIAGNOSIS — M1612 Unilateral primary osteoarthritis, left hip: Secondary | ICD-10-CM

## 2017-02-24 DIAGNOSIS — Z471 Aftercare following joint replacement surgery: Secondary | ICD-10-CM | POA: Diagnosis not present

## 2017-02-24 DIAGNOSIS — M545 Low back pain: Secondary | ICD-10-CM | POA: Diagnosis present

## 2017-02-24 DIAGNOSIS — K219 Gastro-esophageal reflux disease without esophagitis: Secondary | ICD-10-CM | POA: Diagnosis present

## 2017-02-24 DIAGNOSIS — Z96611 Presence of right artificial shoulder joint: Secondary | ICD-10-CM | POA: Diagnosis present

## 2017-02-24 DIAGNOSIS — I110 Hypertensive heart disease with heart failure: Secondary | ICD-10-CM | POA: Diagnosis present

## 2017-02-24 DIAGNOSIS — M160 Bilateral primary osteoarthritis of hip: Secondary | ICD-10-CM | POA: Diagnosis present

## 2017-02-24 DIAGNOSIS — Z8711 Personal history of peptic ulcer disease: Secondary | ICD-10-CM | POA: Diagnosis not present

## 2017-02-24 DIAGNOSIS — M109 Gout, unspecified: Secondary | ICD-10-CM | POA: Diagnosis present

## 2017-02-24 DIAGNOSIS — Z96641 Presence of right artificial hip joint: Secondary | ICD-10-CM | POA: Diagnosis not present

## 2017-02-24 DIAGNOSIS — Z950 Presence of cardiac pacemaker: Secondary | ICD-10-CM

## 2017-02-24 DIAGNOSIS — I714 Abdominal aortic aneurysm, without rupture: Secondary | ICD-10-CM | POA: Diagnosis present

## 2017-02-24 DIAGNOSIS — Z96653 Presence of artificial knee joint, bilateral: Secondary | ICD-10-CM | POA: Diagnosis present

## 2017-02-24 DIAGNOSIS — M1611 Unilateral primary osteoarthritis, right hip: Secondary | ICD-10-CM | POA: Diagnosis not present

## 2017-02-24 DIAGNOSIS — I252 Old myocardial infarction: Secondary | ICD-10-CM

## 2017-02-24 DIAGNOSIS — Z9884 Bariatric surgery status: Secondary | ICD-10-CM

## 2017-02-24 DIAGNOSIS — I5022 Chronic systolic (congestive) heart failure: Secondary | ICD-10-CM | POA: Diagnosis present

## 2017-02-24 DIAGNOSIS — Z96612 Presence of left artificial shoulder joint: Secondary | ICD-10-CM | POA: Diagnosis present

## 2017-02-24 DIAGNOSIS — G8929 Other chronic pain: Secondary | ICD-10-CM | POA: Diagnosis present

## 2017-02-24 DIAGNOSIS — Z01818 Encounter for other preprocedural examination: Secondary | ICD-10-CM | POA: Diagnosis not present

## 2017-02-24 DIAGNOSIS — R52 Pain, unspecified: Secondary | ICD-10-CM

## 2017-02-24 DIAGNOSIS — I517 Cardiomegaly: Secondary | ICD-10-CM | POA: Diagnosis not present

## 2017-02-24 HISTORY — DX: Unilateral primary osteoarthritis, left hip: M16.12

## 2017-02-24 HISTORY — PX: TOTAL HIP ARTHROPLASTY: SHX124

## 2017-02-24 SURGERY — ARTHROPLASTY, HIP, TOTAL,POSTERIOR APPROACH
Anesthesia: Monitor Anesthesia Care | Site: Hip | Laterality: Right

## 2017-02-24 MED ORDER — OXYCODONE HCL 5 MG PO TABS
10.0000 mg | ORAL_TABLET | ORAL | Status: DC | PRN
  Administered 2017-02-24 – 2017-02-26 (×8): 10 mg via ORAL
  Filled 2017-02-24 (×8): qty 2

## 2017-02-24 MED ORDER — LIDOCAINE 2% (20 MG/ML) 5 ML SYRINGE
INTRAMUSCULAR | Status: AC
  Filled 2017-02-24: qty 5

## 2017-02-24 MED ORDER — OXYCODONE HCL 5 MG PO TABS: 5.0000 mg | ORAL_TABLET | ORAL | 0 refills | Status: DC | PRN

## 2017-02-24 MED ORDER — PHENOL 1.4 % MT LIQD: 1.0000 | OROMUCOSAL | Status: DC | PRN

## 2017-02-24 MED ORDER — PANTOPRAZOLE SODIUM 40 MG PO TBEC: 40.0000 mg | DELAYED_RELEASE_TABLET | Freq: Two times a day (BID) | ORAL | Status: DC | PRN

## 2017-02-24 MED ORDER — FENTANYL CITRATE (PF) 250 MCG/5ML IJ SOLN
INTRAMUSCULAR | Status: AC
  Filled 2017-02-24: qty 5

## 2017-02-24 MED ORDER — PHENYLEPHRINE HCL 10 MG/ML IJ SOLN
INTRAMUSCULAR | Status: DC | PRN
  Administered 2017-02-24: 80 ug via INTRAVENOUS

## 2017-02-24 MED ORDER — BUPIVACAINE IN DEXTROSE 0.75-8.25 % IT SOLN
INTRATHECAL | Status: DC | PRN
  Administered 2017-02-24: 1.6 mL via INTRATHECAL

## 2017-02-24 MED ORDER — PROPOFOL 500 MG/50ML IV EMUL
INTRAVENOUS | Status: DC | PRN
  Administered 2017-02-24: 25 ug/kg/min via INTRAVENOUS

## 2017-02-24 MED ORDER — ONDANSETRON HCL 4 MG/2ML IJ SOLN
INTRAMUSCULAR | Status: DC | PRN
  Administered 2017-02-24: 4 mg via INTRAVENOUS

## 2017-02-24 MED ORDER — BUPIVACAINE-EPINEPHRINE 0.5% -1:200000 IJ SOLN
INTRAMUSCULAR | Status: DC | PRN
  Administered 2017-02-24: 50 mL

## 2017-02-24 MED ORDER — BISACODYL 5 MG PO TBEC: 5.0000 mg | DELAYED_RELEASE_TABLET | Freq: Every day | ORAL | Status: DC | PRN

## 2017-02-24 MED ORDER — METHOCARBAMOL 500 MG PO TABS
500.0000 mg | ORAL_TABLET | Freq: Four times a day (QID) | ORAL | Status: DC | PRN
  Administered 2017-02-24: 500 mg via ORAL
  Filled 2017-02-24: qty 1

## 2017-02-24 MED ORDER — ONDANSETRON HCL 4 MG PO TABS: 4.0000 mg | ORAL_TABLET | Freq: Four times a day (QID) | ORAL | Status: DC | PRN

## 2017-02-24 MED ORDER — FENTANYL CITRATE (PF) 100 MCG/2ML IJ SOLN
INTRAMUSCULAR | Status: DC | PRN
  Administered 2017-02-24: 50 ug via INTRAVENOUS
  Administered 2017-02-24 (×2): 25 ug via INTRAVENOUS

## 2017-02-24 MED ORDER — EPHEDRINE SULFATE 50 MG/ML IJ SOLN
INTRAMUSCULAR | Status: DC | PRN
  Administered 2017-02-24: 10 mg via INTRAVENOUS

## 2017-02-24 MED ORDER — ROPINIROLE HCL 0.5 MG PO TABS
0.5000 mg | ORAL_TABLET | Freq: Every day | ORAL | Status: DC
  Administered 2017-02-25: 0.5 mg via ORAL
  Filled 2017-02-24: qty 1

## 2017-02-24 MED ORDER — FENTANYL CITRATE (PF) 100 MCG/2ML IJ SOLN
25.0000 ug | INTRAMUSCULAR | Status: DC | PRN
  Administered 2017-02-24 (×2): 50 ug via INTRAVENOUS

## 2017-02-24 MED ORDER — TIZANIDINE HCL 2 MG PO TABS: 2.0000 mg | ORAL_TABLET | Freq: Four times a day (QID) | ORAL | 0 refills | Status: DC | PRN

## 2017-02-24 MED ORDER — BUPIVACAINE-EPINEPHRINE (PF) 0.5% -1:200000 IJ SOLN
INTRAMUSCULAR | Status: AC
  Filled 2017-02-24: qty 30

## 2017-02-24 MED ORDER — FERROUS SULFATE 325 (65 FE) MG PO TABS
325.0000 mg | ORAL_TABLET | Freq: Every day | ORAL | Status: DC
  Administered 2017-02-24 – 2017-02-26 (×3): 325 mg via ORAL
  Filled 2017-02-24 (×3): qty 1

## 2017-02-24 MED ORDER — SUCRALFATE 1 G PO TABS
1.0000 g | ORAL_TABLET | Freq: Two times a day (BID) | ORAL | Status: DC
  Administered 2017-02-24 – 2017-02-25 (×2): 1 g via ORAL
  Filled 2017-02-24 (×2): qty 1

## 2017-02-24 MED ORDER — METOPROLOL SUCCINATE ER 100 MG PO TB24
100.0000 mg | ORAL_TABLET | Freq: Every evening | ORAL | Status: DC
  Administered 2017-02-24: 100 mg via ORAL
  Filled 2017-02-24 (×2): qty 1

## 2017-02-24 MED ORDER — PHENYLEPHRINE HCL 10 MG/ML IJ SOLN
INTRAMUSCULAR | Status: DC | PRN
  Administered 2017-02-24: 25 ug/min via INTRAVENOUS

## 2017-02-24 MED ORDER — CELECOXIB 200 MG PO CAPS
200.0000 mg | ORAL_CAPSULE | Freq: Two times a day (BID) | ORAL | Status: DC
  Administered 2017-02-24 – 2017-02-26 (×4): 200 mg via ORAL
  Filled 2017-02-24 (×4): qty 1

## 2017-02-24 MED ORDER — DEXTROSE 5 % IV SOLN: 500.0000 mg | Freq: Four times a day (QID) | INTRAVENOUS | Status: DC | PRN

## 2017-02-24 MED ORDER — FESOTERODINE FUMARATE ER 4 MG PO TB24
4.0000 mg | ORAL_TABLET | Freq: Every day | ORAL | Status: DC
  Administered 2017-02-25 – 2017-02-26 (×2): 4 mg via ORAL
  Filled 2017-02-24 (×2): qty 1

## 2017-02-24 MED ORDER — HYDROMORPHONE HCL 1 MG/ML IJ SOLN: 0.5000 mg | INTRAMUSCULAR | Status: DC | PRN

## 2017-02-24 MED ORDER — SODIUM CHLORIDE 0.9 % IR SOLN
Status: DC | PRN
  Administered 2017-02-24: 1

## 2017-02-24 MED ORDER — GABAPENTIN 300 MG PO CAPS
300.0000 mg | ORAL_CAPSULE | Freq: Three times a day (TID) | ORAL | Status: DC
  Administered 2017-02-24 – 2017-02-26 (×6): 300 mg via ORAL
  Filled 2017-02-24 (×6): qty 1

## 2017-02-24 MED ORDER — ROPINIROLE HCL 1 MG PO TABS
1.0000 mg | ORAL_TABLET | Freq: Every day | ORAL | Status: DC
  Administered 2017-02-24 – 2017-02-25 (×2): 1 mg via ORAL
  Filled 2017-02-24 (×2): qty 1

## 2017-02-24 MED ORDER — POLYETHYLENE GLYCOL 3350 17 G PO PACK: 17.0000 g | PACK | Freq: Every day | ORAL | Status: DC | PRN

## 2017-02-24 MED ORDER — FLEET ENEMA 7-19 GM/118ML RE ENEM: 1.0000 | ENEMA | Freq: Once | RECTAL | Status: DC | PRN

## 2017-02-24 MED ORDER — CHLORHEXIDINE GLUCONATE 4 % EX LIQD: 60.0000 mL | Freq: Once | CUTANEOUS | Status: DC

## 2017-02-24 MED ORDER — METOCLOPRAMIDE HCL 5 MG PO TABS: 5.0000 mg | ORAL_TABLET | Freq: Three times a day (TID) | ORAL | Status: DC | PRN

## 2017-02-24 MED ORDER — LOPERAMIDE HCL 2 MG PO CAPS: 2.0000 mg | ORAL_CAPSULE | Freq: Three times a day (TID) | ORAL | Status: DC | PRN

## 2017-02-24 MED ORDER — ROPINIROLE HCL 0.5 MG PO TABS: 0.5000 mg | ORAL_TABLET | Freq: Three times a day (TID) | ORAL | Status: DC

## 2017-02-24 MED ORDER — DEXAMETHASONE SODIUM PHOSPHATE 10 MG/ML IJ SOLN
10.0000 mg | Freq: Once | INTRAMUSCULAR | Status: AC
  Administered 2017-02-25: 10 mg via INTRAVENOUS
  Filled 2017-02-24: qty 1

## 2017-02-24 MED ORDER — FENTANYL CITRATE (PF) 100 MCG/2ML IJ SOLN
INTRAMUSCULAR | Status: AC
Start: 2017-02-24 — End: 2017-02-25
  Filled 2017-02-24: qty 2

## 2017-02-24 MED ORDER — ALUMINUM HYDROXIDE GEL 320 MG/5ML PO SUSP: 15.0000 mL | ORAL | Status: DC | PRN

## 2017-02-24 MED ORDER — ONDANSETRON HCL 4 MG/2ML IJ SOLN: 4.0000 mg | Freq: Once | INTRAMUSCULAR | Status: DC | PRN

## 2017-02-24 MED ORDER — ONDANSETRON HCL 4 MG/2ML IJ SOLN
INTRAMUSCULAR | Status: AC
  Filled 2017-02-24: qty 2

## 2017-02-24 MED ORDER — MENTHOL 3 MG MT LOZG: 1.0000 | LOZENGE | OROMUCOSAL | Status: DC | PRN

## 2017-02-24 MED ORDER — COLCHICINE 0.6 MG PO TABS: 0.6000 mg | ORAL_TABLET | Freq: Two times a day (BID) | ORAL | Status: DC | PRN

## 2017-02-24 MED ORDER — ONDANSETRON HCL 4 MG/2ML IJ SOLN
4.0000 mg | Freq: Four times a day (QID) | INTRAMUSCULAR | Status: DC | PRN
Start: 2017-02-24 — End: 2017-02-26

## 2017-02-24 MED ORDER — LACTATED RINGERS IV SOLN
INTRAVENOUS | Status: DC
  Administered 2017-02-24 (×2): via INTRAVENOUS

## 2017-02-24 MED ORDER — METOCLOPRAMIDE HCL 5 MG/ML IJ SOLN: 5.0000 mg | Freq: Three times a day (TID) | INTRAMUSCULAR | Status: DC | PRN

## 2017-02-24 MED ORDER — DIPHENHYDRAMINE HCL 12.5 MG/5ML PO ELIX: 12.5000 mg | ORAL_SOLUTION | ORAL | Status: DC | PRN

## 2017-02-24 MED ORDER — LORATADINE 10 MG PO TABS
10.0000 mg | ORAL_TABLET | Freq: Every day | ORAL | Status: DC
  Administered 2017-02-24 – 2017-02-25 (×2): 10 mg via ORAL
  Filled 2017-02-24 (×2): qty 1

## 2017-02-24 MED ORDER — KCL IN DEXTROSE-NACL 20-5-0.45 MEQ/L-%-% IV SOLN
INTRAVENOUS | Status: DC
  Administered 2017-02-24: 21:00:00 via INTRAVENOUS
  Filled 2017-02-24 (×2): qty 1000

## 2017-02-24 MED ORDER — DOCUSATE SODIUM 100 MG PO CAPS
100.0000 mg | ORAL_CAPSULE | Freq: Two times a day (BID) | ORAL | Status: DC
  Administered 2017-02-24 – 2017-02-26 (×4): 100 mg via ORAL
  Filled 2017-02-24 (×4): qty 1

## 2017-02-24 MED ORDER — LEVOCETIRIZINE DIHYDROCHLORIDE 5 MG PO TABS: 5.0000 mg | ORAL_TABLET | Freq: Every evening | ORAL | Status: DC

## 2017-02-24 MED ORDER — OXYCODONE HCL 5 MG PO TABS: 5.0000 mg | ORAL_TABLET | ORAL | Status: DC | PRN

## 2017-02-24 SURGICAL SUPPLY — 59 items
BLADE SAW SAG 73X25 THK (BLADE) ×1
BLADE SAW SGTL 73X25 THK (BLADE) ×2 IMPLANT
BRUSH FEMORAL CANAL (MISCELLANEOUS) IMPLANT
CAPT HIP TOTAL 2 ×3 IMPLANT
COVER SURGICAL LIGHT HANDLE (MISCELLANEOUS) ×3 IMPLANT
DRAPE INCISE IOBAN 66X45 STRL (DRAPES) ×3 IMPLANT
DRAPE ORTHO SPLIT 77X108 STRL (DRAPES) ×4
DRAPE SURG ORHT 6 SPLT 77X108 (DRAPES) ×2 IMPLANT
DRAPE U-SHAPE 47X51 STRL (DRAPES) ×3 IMPLANT
DRSG ADAPTIC 3X8 NADH LF (GAUZE/BANDAGES/DRESSINGS) ×3 IMPLANT
DRSG AQUACEL AG ADV 3.5X 6 (GAUZE/BANDAGES/DRESSINGS) ×3 IMPLANT
DRSG PAD ABDOMINAL 8X10 ST (GAUZE/BANDAGES/DRESSINGS) ×6 IMPLANT
DURAPREP 26ML APPLICATOR (WOUND CARE) ×3 IMPLANT
ELECT BLADE 6.5 EXT (BLADE) ×3 IMPLANT
ELECT CAUTERY BLADE 6.4 (BLADE) ×3 IMPLANT
ELECT REM PT RETURN 9FT ADLT (ELECTROSURGICAL) ×3
ELECTRODE REM PT RTRN 9FT ADLT (ELECTROSURGICAL) ×1 IMPLANT
FACESHIELD WRAPAROUND (MASK) ×6 IMPLANT
GAUZE SPONGE 4X4 12PLY STRL (GAUZE/BANDAGES/DRESSINGS) ×3 IMPLANT
GLOVE BIOGEL PI IND STRL 8 (GLOVE) ×2 IMPLANT
GLOVE BIOGEL PI INDICATOR 8 (GLOVE) ×4
GLOVE ORTHO TXT STRL SZ7.5 (GLOVE) ×6 IMPLANT
GLOVE SURG ORTHO 8.0 STRL STRW (GLOVE) ×6 IMPLANT
GOWN STRL REUS W/ TWL LRG LVL3 (GOWN DISPOSABLE) ×1 IMPLANT
GOWN STRL REUS W/ TWL XL LVL3 (GOWN DISPOSABLE) ×1 IMPLANT
GOWN STRL REUS W/TWL 2XL LVL3 (GOWN DISPOSABLE) ×3 IMPLANT
GOWN STRL REUS W/TWL LRG LVL3 (GOWN DISPOSABLE) ×2
GOWN STRL REUS W/TWL XL LVL3 (GOWN DISPOSABLE) ×2
HANDPIECE INTERPULSE COAX TIP (DISPOSABLE) ×2
HOOD PEEL AWAY FACE SHEILD DIS (HOOD) ×3 IMPLANT
IMMOBILIZER KNEE 22 UNIV (SOFTGOODS) ×3 IMPLANT
KIT BASIN OR (CUSTOM PROCEDURE TRAY) ×3 IMPLANT
KIT ROOM TURNOVER OR (KITS) ×3 IMPLANT
MANIFOLD NEPTUNE II (INSTRUMENTS) ×3 IMPLANT
NEEDLE 22X1 1/2 (OR ONLY) (NEEDLE) ×6 IMPLANT
NEEDLE MAYO TROCAR (NEEDLE) IMPLANT
NS IRRIG 1000ML POUR BTL (IV SOLUTION) ×3 IMPLANT
PACK TOTAL JOINT (CUSTOM PROCEDURE TRAY) ×3 IMPLANT
PACK UNIVERSAL I (CUSTOM PROCEDURE TRAY) ×3 IMPLANT
PAD ARMBOARD 7.5X6 YLW CONV (MISCELLANEOUS) ×6 IMPLANT
PRESSURIZER FEMORAL UNIV (MISCELLANEOUS) IMPLANT
SET HNDPC FAN SPRY TIP SCT (DISPOSABLE) ×1 IMPLANT
STAPLER VISISTAT 35W (STAPLE) ×3 IMPLANT
SUCTION FRAZIER HANDLE 10FR (MISCELLANEOUS) ×2
SUCTION TUBE FRAZIER 10FR DISP (MISCELLANEOUS) ×1 IMPLANT
SUT ETHIBOND 2 V 37 (SUTURE) ×3 IMPLANT
SUT ETHIBOND NAB CT1 #1 30IN (SUTURE) ×3 IMPLANT
SUT VIC AB 0 CT1 27 (SUTURE) ×2
SUT VIC AB 0 CT1 27XBRD ANBCTR (SUTURE) ×1 IMPLANT
SUT VIC AB 1 CTX 36 (SUTURE) ×2
SUT VIC AB 1 CTX36XBRD ANBCTR (SUTURE) ×1 IMPLANT
SUT VIC AB 2-0 CT1 27 (SUTURE) ×6
SUT VIC AB 2-0 CT1 TAPERPNT 27 (SUTURE) ×3 IMPLANT
SYR CONTROL 10ML LL (SYRINGE) ×6 IMPLANT
TOWEL OR 17X24 6PK STRL BLUE (TOWEL DISPOSABLE) ×3 IMPLANT
TOWEL OR 17X26 10 PK STRL BLUE (TOWEL DISPOSABLE) ×3 IMPLANT
TOWER CARTRIDGE SMART MIX (DISPOSABLE) IMPLANT
TRAY CATH 16FR W/PLASTIC CATH (SET/KITS/TRAYS/PACK) IMPLANT
WATER STERILE IRR 1000ML POUR (IV SOLUTION) ×3 IMPLANT

## 2017-02-24 NOTE — Anesthesia Procedure Notes (Signed)
Spinal  Patient location during procedure: OR Start time: 02/24/2017 11:18 AM End time: 02/24/2017 11:21 AM Staffing Anesthesiologist: Catalina Gravel, MD Performed: anesthesiologist  Preanesthetic Checklist Completed: patient identified, surgical consent, pre-op evaluation, timeout performed, IV checked, risks and benefits discussed and monitors and equipment checked Spinal Block Patient position: sitting Prep: site prepped and draped and DuraPrep Patient monitoring: continuous pulse ox and blood pressure Approach: midline Location: L3-4 Injection technique: single-shot Needle Needle type: Pencan  Needle gauge: 24 G Additional Notes Functioning IV was confirmed and monitors were applied. Sterile prep and drape, including hand hygiene, mask and sterile gloves were used. The patient was positioned and the spine was prepped. The skin was anesthetized with lidocaine.  Free flow of clear CSF was obtained prior to injecting local anesthetic into the CSF.  The spinal needle aspirated freely following injection.  The needle was carefully withdrawn.  The patient tolerated the procedure well. Consent was obtained prior to procedure with all questions answered and concerns addressed. Risks including but not limited to bleeding, infection, nerve damage, paralysis, failed block, inadequate analgesia, allergic reaction, high spinal, itching and headache were discussed and the patient wished to proceed.   Hoy Morn, MD

## 2017-02-24 NOTE — Discharge Instructions (Signed)

## 2017-02-24 NOTE — Anesthesia Preprocedure Evaluation (Addendum)
Anesthesia Evaluation  Patient identified by MRN, date of birth, ID band Patient awake    Reviewed: Allergy & Precautions, NPO status , Patient's Chart, lab work & pertinent test results, reviewed documented beta blocker date and time   Airway Mallampati: II  TM Distance: <3 FB Neck ROM: Full    Dental  (+) Teeth Intact, Dental Advisory Given, Missing, Chipped,    Pulmonary asthma ,    Pulmonary exam normal breath sounds clear to auscultation       Cardiovascular hypertension, Pt. on home beta blockers + Past MI and + Peripheral Vascular Disease (AAA)  + dysrhythmias Atrial Fibrillation + pacemaker (SSS)  Rhythm:Irregular Rate:Normal  Echo 11/30/16: Study Conclusions  - Left ventricle: Diffuse hypokinesis worse in the mid and basal inferior wall. The cavity size was mildly dilated. Wall thickness was increased in a pattern of mild LVH. Systolic function was moderately to severely reduced. The estimated ejection fraction was in the range of 30% to 35%. The study is not technically sufficient to allow evaluation of LV diastolic function. - Mitral valve: Calcified annulus. There was moderate   regurgitation. - Left atrium: The atrium was moderately dilated. - Atrial septum: No defect or patent foramen ovale was identified.   Neuro/Psych  Neuromuscular disease negative psych ROS   GI/Hepatic Neg liver ROS, PUD, GERD  Medicated,H/o gastric bypass, peptic ulcers, gi bleed    Endo/Other  negative endocrine ROS  Renal/GU negative Renal ROS     Musculoskeletal  (+) Arthritis , Rheumatoid disorders,    Abdominal   Peds  Hematology negative hematology ROS (+) Plt 335k   Anesthesia Other Findings Day of surgery medications reviewed with the patient.  Reproductive/Obstetrics                           Anesthesia Physical Anesthesia Plan  ASA: III  Anesthesia Plan: Spinal   Post-op Pain Management:      Induction: Intravenous  PONV Risk Score and Plan: 2 and Ondansetron and Propofol infusion  Airway Management Planned: Simple Face Mask  Additional Equipment:   Intra-op Plan:   Post-operative Plan:   Informed Consent: I have reviewed the patients History and Physical, chart, labs and discussed the procedure including the risks, benefits and alternatives for the proposed anesthesia with the patient or authorized representative who has indicated his/her understanding and acceptance.   Dental advisory given  Plan Discussed with: CRNA, Anesthesiologist and Surgeon  Anesthesia Plan Comments:        Anesthesia Quick Evaluation

## 2017-02-24 NOTE — Evaluation (Signed)
Physical Therapy Evaluation Patient Details Name: Deanna Spencer MRN: [id number] DOB: [birth year] Today's Date: 02/24/2017   History of Present Illness  Pt is a 77 y/o female s/p elective R THA, anterior approach. PMH includes a fib, gout, RA, osteoporosis, bilateral TSA, bilateral TKA, lubar fusion, GI bleed.   Clinical Impression  Pt s/p surgery above with deficits below. Pt in increased pain and only agreeable to transferring to chair this session. HEP and precautions reviewed, however, pt will need further review. Anticipate pt will progress well once pain improved. Will continue to follow acutely to maximize functional mobility independence and safety.     Follow Up Recommendations Follow surgeon's recommendation for DC plan and follow-up therapies;Supervision for mobility/OOB    Equipment Recommendations  None recommended by PT    Recommendations for Other Services OT consult     Precautions / Restrictions Precautions Precautions: Anterior Hip Precaution Booklet Issued: Yes (comment) Precaution Comments: Reviewed anterior hip precautions with pt. Limited tolerance for supine ther ex secondary to pain.  Restrictions Weight Bearing Restrictions: Yes RLE Weight Bearing: Weight bearing as tolerated      Mobility  Bed Mobility Overal bed mobility: Needs Assistance Bed Mobility: Rolling;Sidelying to Sit Rolling: Supervision Sidelying to sit: Supervision       General bed mobility comments: Insistent on rolling to operative side despite education to roll to opposite side. Supervision for safety.   Transfers Overall transfer level: Needs assistance Equipment used: Rolling walker (2 wheeled) Transfers: Sit to/from [facility] Sit to Stand: Min assist Stand pivot transfers: Min assist       General transfer comment: Min A for lift assist and steadying assist. Verbal cues for safe hand placement. Pt only agreeable to performing stand pivot to chair secondary  to pain. Verbal cues for sequencing using RW. Min A for steadying assist. Pt hesitant to place weight on RLE, so encouraged WB as well.   Ambulation/Gait             General Gait Details: NT  Stairs            Wheelchair Mobility    Modified Rankin (Stroke Patients Only)       Balance Overall balance assessment: Needs assistance Sitting-balance support: No upper extremity supported;Feet supported Sitting balance-Leahy Scale: Good     Standing balance support: Bilateral upper extremity supported;During functional activity Standing balance-Leahy Scale: Poor Standing balance comment: Reliant on BUE support.                              Pertinent Vitals/Pain Pain Assessment: Faces Faces Pain Scale: Hurts even more Pain Location: R hip  Pain Descriptors / Indicators: Aching;Operative site guarding Pain Intervention(s): Limited activity within patient's tolerance;Monitored during session;Repositioned    Home Living Family/patient expects to be discharged to:: Private residence Living Arrangements: Children Available Help at Discharge: Family;Available 24 hours/day Type of Home: House Home Access: Stairs to enter Entrance Stairs-Rails: None Entrance Stairs-Number of Steps: 1(curb step ) Home Layout: One level Home Equipment: Cane - single point;Shower seat;Grab bars - toilet;Grab bars - tub/shower;Walker - 2 wheels;Other (comment)(bed rails )      Prior Function Level of Independence: Needs assistance   Gait / Transfers Assistance Needed: uses a cane for gait  ADL's / Homemaking Assistance Needed: Pt's daughter provides supervision/assist for bathing ADLs, assist for socks and shoes        Hand Dominance   Dominant Hand: Right  Extremity/Trunk Assessment   Upper Extremity Assessment Upper Extremity Assessment: Defer to OT evaluation    Lower Extremity Assessment Lower Extremity Assessment: RLE deficits/detail RLE Deficits / Details:  Deficits consistent with post op pain and weakness. Sensory in tact.     Cervical / Trunk Assessment Cervical / Trunk Assessment: Kyphotic  Communication   Communication: No difficulties  Cognition Arousal/Alertness: Awake/alert Behavior During Therapy: WFL for tasks assessed/performed Overall Cognitive Status: Within Functional Limits for tasks assessed                                        General Comments      Exercises Total Joint Exercises Ankle Circles/Pumps: AROM;Both;20 reps Heel Slides: (verbally reviewed ) Long Arc Quad: AROM;Both;10 reps   Assessment/Plan    PT Assessment Patient needs continued PT services  PT Problem List Decreased strength;Decreased range of motion;Decreased activity tolerance;Decreased balance;Decreased knowledge of use of DME;Decreased knowledge of precautions;Decreased mobility;Pain       PT Treatment Interventions DME instruction;Gait training;Therapeutic activities;Functional mobility training;Stair training;Therapeutic exercise;Balance training;Neuromuscular re-education;Patient/family education    PT Goals (Current goals can be found in the Care Plan section)  Acute Rehab PT Goals Patient Stated Goal: to decrease pain  PT Goal Formulation: With patient Time For Goal Achievement: 03/03/17 Potential to Achieve Goals: Good    Frequency 7X/week   Barriers to discharge        Co-evaluation               AM-PAC PT "6 Clicks" Daily Activity  Outcome Measure Difficulty turning over in bed (including adjusting bedclothes, sheets and blankets)?: A Little Difficulty moving from lying on back to sitting on the side of the bed? : Unable Difficulty sitting down on and standing up from a chair with arms (e.g., wheelchair, bedside commode, etc,.)?: Unable Help needed moving to and from a bed to chair (including a wheelchair)?: A Little Help needed walking in hospital room?: A Little Help needed climbing 3-5 steps with a  railing? : A Lot 6 Click Score: 13    End of Session Equipment Utilized During Treatment: Gait belt Activity Tolerance: Patient limited by pain Patient left: in chair;with call bell/phone within reach Nurse Communication: Mobility status PT Visit Diagnosis: Other abnormalities of gait and mobility (R26.89);Pain;Unsteadiness on feet (R26.81) Pain - Right/Left: Right Pain - part of body: Hip    Time: 3612-2449 PT Time Calculation (min) (ACUTE ONLY): 26 min   Charges:   PT Evaluation $PT Eval Low Complexity: 1 Low PT Treatments $Therapeutic Activity: 8-22 mins   PT G Codes:        Leighton Ruff, PT, DPT  Acute Rehabilitation Services  Pager: [phone]   Rudean Hitt 02/24/2017, 6:59 PM

## 2017-02-24 NOTE — Anesthesia Procedure Notes (Addendum)
Procedure Name: MAC Date/Time: 02/24/2017 11:10 AM Performed by: Scheryl Darter, CRNA Pre-anesthesia Checklist: Patient identified, Emergency Drugs available, Suction available, Patient being monitored and Timeout performed Oxygen Delivery Method: Nasal cannula Placement Confirmation: positive ETCO2

## 2017-02-24 NOTE — Interval H&P Note (Signed)
History and Physical Interval Note:  02/24/2017 9:24 AM  Deanna Spencer  has presented today for surgery, with the diagnosis of OA RIGHT HIP  The various methods of treatment have been discussed with the patient and family. After consideration of risks, benefits and other options for treatment, the patient has consented to  Procedure(s): TOTAL HIP ARTHROPLASTY (Right) as a surgical intervention .  The patient's history has been reviewed, patient examined, no change in status, stable for surgery.  I have reviewed the patient's chart and labs.  Questions were answered to the patient's satisfaction.     Yvette Rack

## 2017-02-24 NOTE — Transfer of Care (Signed)
Immediate Anesthesia Transfer of Care Note  Patient: Deanna Spencer  Procedure(s) Performed: TOTAL HIP ARTHROPLASTY (Right Hip)  Patient Location: PACU  Anesthesia Type:MAC, Regional and Spinal  Level of Consciousness: awake, alert  and oriented  Airway & Oxygen Therapy: Patient Spontanous Breathing and Patient connected to nasal cannula oxygen  Post-op Assessment: Report given to RN, Post -op Vital signs reviewed and stable and Patient moving all extremities  Post vital signs: Reviewed and stable  Last Vitals:  Vitals:   02/24/17 0836  BP: 118/76  Pulse: 76  Resp: 18  Temp: 36.5 C  SpO2: 98%    Last Pain:  Vitals:   02/24/17 1342  TempSrc:   PainSc: 0-No pain         Complications: No apparent anesthesia complications

## 2017-02-24 NOTE — Op Note (Addendum)
OPERATIVE REPORT    DATE OF PROCEDURE:  02/24/2017       PREOPERATIVE DIAGNOSIS:  OA RIGHT HIP                                                          POSTOPERATIVE DIAGNOSIS:  OA RIGHT HIP                                                           PROCEDURE: Anterior R total hip arthroplasty using a 48 mm DePuy Pinnacle  Cup, [employer], 0-degree polyethylene liner, a +1.5 x32 mm ceramic head, a 6 std Depuy Triloc stem   SURGEON: Deanna Spencer    ASSISTANT:   Earlie Server, MD  (present throughout entire procedure and necessary for timely completion of the procedure)   ANESTHESIA: Spinal BLOOD LOSS: 350cc FLUID REPLACEMENT: 1500 crystalloid Antibiotic: 2gm ancef Tranexamic Acid: 1gm IV, 2gm Topical COMPLICATIONS: none    INDICATIONS FOR PROCEDURE: A 77 y.o. year-old With  OA RIGHT HIP   for 3 years, x-rays show bone-on-bone arthritic changes, and osteophytes. Despite conservative measures with observation, anti-inflammatory medicine, narcotics, use of a cane, has severe unremitting pain and can ambulate only a few blocks before resting. Patient desires elective R total hip arthroplasty to decrease pain and increase function. The risks, benefits, and alternatives were discussed at length including but not limited to the risks of infection, bleeding, nerve injury, stiffness, blood clots, the need for revision surgery, cardiopulmonary complications, among others, and they were willing to proceed. Questions answered     PROCEDURE IN DETAIL: The patient was identified by armband,  received preoperative IV antibiotics in the holding area at [facility], taken to the operating room , appropriate anesthetic monitors  were attached and  anesthesia was induced with the patienton the gurney. The HANA boots were applied to the feet and he was then transferred to the HANA table with a peroneal post and support underneath the non-operative le, which was locked in 5 lb  traction. Theoperative lower extremity was then prepped and draped in the usual sterile fashion from just above the iliac crest to the knee. And a timeout procedure was performed. We then made a 12 cm incision along the interval at the leading edge of the tensor fascia lata of starting at 2 cm lateral to and 2 cm distal to the ASIS. Small bleeders in the skin and subcutaneous tissue identified and cauterized we dissected down to the fascia and made an incision in the fascia allowing [place] to elevate the fascia of the tensor muscle and exploited the interval between the rectus and the tensor fascia lata. A Hohmann retractor was then placed along the superior neck of the femur and a Cobra retractor along the inferior neck of the femur we teed the capsule starting out at the superior anterior aspect of the acetabulum going distally and made the T along the neck both leaflets of the T were tagged with #2 Ethibond suture. Cobra retractors were then placed along the inferior and superior neck allowing [place] to perform a standard neck cut and  removed the femoral head with a power corkscrew. We then placed a right angle Hohmann retractor along the anterior aspect of the acetabulum a spiked Cobra in the cotyloid notch and posteriorly a Muelller retractor. We then sequentially reamed up to a 47 mm basket reamer obtaining good coverage in all quadrants, verified by C-arm imaging. Under C-arm control with and hammered into place a 48 mm Pinnacle cup in 45 of abduction and 15 of anteversion. The cup seated nicely and required no supplemental screws. We then placed a central hole Eliminator and a 0 polyethylene liner. The foot was then externally rotated to 110, the HANA elevator was placed around the flare of the greater trochanter and the limb was extended and abducted delivering the proximal femur up into the wound. A medium Hohmann retractor was placed over the greater trochanter and a Mueller retractor along the posterior  femoral neck completing the exposure. We then performed releases superiorly and and inferiorly of the capsule going back to the pirformis fossa superiorly and to the lesser trochanter inferiorly. We then entered the proximal femur with the box cutting offset chisel followed by, a canal sounder, the chili pepper and broaching up to a 6 std broach. This seated nicely and we reamed the calcar. A trial reduction was performed with a 1.5 mm 32 mm head.The limb lengths were excellent the hip was stable in 90 of external rotation. At this point the trial components removed and we hammered into place a # 6 std  Offset Tri-Lock stem with G[employer] coating. A + 1.5 x32 mm ceramic ball was then hammered into place the hip was reduced and final C-arm images obtained. The wound was thoroughly irrigated with normal saline solution. We repaired the ant capsule and the tensor fascia lot a with running 0 vicryl suture. the subcutaneous tissue was closed with 2-0 and 3-0 Vicryl suture followed by an Aquacil dressing. At this point the patient was awaken and transferred to hospital gurney without difficulty. The subcutaneous tissue with 0 and 2-0 undyed Vicryl suture and the skin with running  3-0 vicryl subcuticular suture. Aquacil dressing was applied. The patient was then unclamped, rolled supine, awaken extubated and taken to recovery room without difficulty in stable condition.   Deanna Spencer 02/24/2017, 12:48 PM

## 2017-02-25 LAB — BASIC METABOLIC PANEL
Anion gap: 11 (ref 5–15)
BUN: 21 mg/dL — ABNORMAL HIGH (ref 6–20)
CHLORIDE: 105 mmol/L (ref 101–111)
CO2: 21 mmol/L — AB (ref 22–32)
CREATININE: 1.2 mg/dL — AB (ref 0.44–1.00)
Calcium: 9.1 mg/dL (ref 8.9–10.3)
GFR calc non Af Amer: 43 mL/min — ABNORMAL LOW (ref >60)
GFR, EST AFRICAN AMERICAN: 50 mL/min — AB (ref >60)
Glucose, Bld: 121 mg/dL — ABNORMAL HIGH (ref 65–99)
POTASSIUM: 4.6 mmol/L (ref 3.5–5.1)
SODIUM: 137 mmol/L (ref 135–145)

## 2017-02-25 LAB — CBC
HEMATOCRIT: 36.5 % (ref 36.0–46.0)
HEMOGLOBIN: 11.3 g/dL — AB (ref 12.0–15.0)
MCH: 31.5 pg (ref 26.0–34.0)
MCHC: 31 g/dL (ref 30.0–36.0)
MCV: 101.7 fL — ABNORMAL HIGH (ref 78.0–100.0)
Platelets: 310 10*3/uL (ref 150–400)
RBC: 3.59 MIL/uL — AB (ref 3.87–5.11)
RDW: 16.7 % — ABNORMAL HIGH (ref 11.5–15.5)
WBC: 10.1 10*3/uL (ref 4.0–10.5)

## 2017-02-25 LAB — GLUCOSE, CAPILLARY: GLUCOSE-CAPILLARY: 184 mg/dL — AB (ref 65–99)

## 2017-02-25 MED ORDER — SUCRALFATE 1 G PO TABS
1.0000 g | ORAL_TABLET | Freq: Four times a day (QID) | ORAL | Status: DC
  Administered 2017-02-25 – 2017-02-26 (×3): 1 g via ORAL
  Filled 2017-02-25 (×3): qty 1

## 2017-02-25 NOTE — Progress Notes (Signed)
  PATIENT ID: Deanna Spencer  MRN: [id number]  DOB/AGE:  01/16/1940 / 77 y.o.  1 Day Post-Op Procedure(s) (LRB): TOTAL HIP ARTHROPLASTY (Right)  Subjective: Pain is mild.  No c/o chest pain or SOB.   Drowsy following pain meds.  Tol PO, IVFs still at [facility]. Desires D/C home tomorrow   Objective: Vital signs in last 24 hours: Temp:  [97 F (36.1 C)-100.3 F (37.9 C)] 97.7 F (36.5 C) (02/16 0732) Pulse Rate:  [51-133] 72 (02/16 0732) Resp:  [14-18] 18 (02/16 0732) BP: (86-123)/(51-81) 102/58 (02/16 0732) SpO2:  [94 %-100 %] 100 % (02/16 0732)  Intake/Output from previous day: 02/15 0701 - 02/16 0700 In: 1567.9 [P.O.:480; I.V.:877.9; IV Piggyback:210] Out: 800 [Urine:500; Blood:300] Intake/Output this shift: Total I/O In: 240 [P.O.:240] Out: -   Recent Labs    02/25/17 0435  HGB 11.3*   Recent Labs    02/25/17 0435  WBC 10.1  RBC 3.59*  HCT 36.5  PLT 310   Recent Labs    02/25/17 0435  NA 137  K 4.6  CL 105  CO2 21*  BUN 21*  CREATININE 1.20*  GLUCOSE 121*  CALCIUM 9.1   No results for input(s): LABPT, INR in the last 72 hours.  Physical Exam: ABD soft Sensation intact distally Intact pulses distally Dorsiflexion/Plantar flexion intact Incision: dressing C/D/I Compartment soft  Assessment/Plan: 1 Day Post-Op Procedure(s) (LRB): TOTAL HIP ARTHROPLASTY (Right)   Up with therapy D/C IV fluids Plan for discharge tomorrow Weight Bearing as Tolerated (WBAT)  VTE prophylaxis: intermittent pneumatic compression boots   Navi Ewton [address], [place], [place]  [address] Office: [phone] Mobile: [phone]  02/25/2017, 10:47 AM

## 2017-02-25 NOTE — Progress Notes (Signed)
Physical Therapy Treatment Patient Details Name: Deanna Spencer MRN: [id number] DOB: 1940-06-15 Today's Date: 02/25/2017    History of Present Illness Pt is a 77 y/o female s/p elective R THA, anterior approach. PMH includes a fib, gout, RA, osteoporosis, bilateral TSA, bilateral TKA, lubar fusion, GI bleed.     PT Comments    Pt is showing progress towards goals, as she was able to ambulate 100 ft in hallway with min guard for safety. Pt quality of gait and distance currently limited by pain. Plan to practice 1 step this PM to maximize pt's safety with mobility and functional independence prior to d/c.   Follow Up Recommendations  Follow surgeon's recommendation for DC plan and follow-up therapies;Supervision for mobility/OOB     Equipment Recommendations  None recommended by PT    Recommendations for Other Services OT consult     Precautions / Restrictions Precautions Precautions: Anterior Hip Precaution Booklet Issued: Yes (comment) Precaution Comments: Pt unable to recall any hip precautions. Reviewed with pt. Restrictions Weight Bearing Restrictions: Yes RLE Weight Bearing: Weight bearing as tolerated    Mobility  Bed Mobility Overal bed mobility: Needs Assistance Bed Mobility: Supine to Sit     Supine to sit: Min assist     General bed mobility comments: Min a to progress R LE off EOB  Transfers Overall transfer level: Needs assistance Equipment used: Rolling walker (2 wheeled) Transfers: Sit to/from Stand Sit to Stand: Min guard         General transfer comment: Pt insisting on performing transfer without assist. She requires increased time and effort to rise into standing. Poor technique and hand placment on RW. Pt cued for correct hand placement, but reports she has been doing it this way for months and does not attempt therapist's reccomendations.  Ambulation/Gait Ambulation/Gait assistance: Min guard Ambulation Distance (Feet): 100 Feet Assistive  device: Rolling walker (2 wheeled) Gait Pattern/deviations: Step-through pattern;Decreased step length - right;Decreased stance time - left;Decreased stride length;Decreased weight shift to right;Antalgic Gait velocity: decreased Gait velocity interpretation: Below normal speed for age/gender General Gait Details: Pt with antalgic gait and decreased WB on R LE. She ambulated 100 ft with min guard for safety. Increased time and effort required for gait. One standing rest break secondry to pain.   Stairs            Wheelchair Mobility    Modified Rankin (Stroke Patients Only)       Balance Overall balance assessment: Needs assistance Sitting-balance support: No upper extremity supported;Feet supported Sitting balance-Leahy Scale: Good     Standing balance support: Bilateral upper extremity supported;During functional activity Standing balance-Leahy Scale: Poor Standing balance comment: Reliant on BUE support.                             Cognition Arousal/Alertness: Awake/alert Behavior During Therapy: WFL for tasks assessed/performed Overall Cognitive Status: Within Functional Limits for tasks assessed                                        Exercises      General Comments        Pertinent Vitals/Pain Pain Assessment: Faces Faces Pain Scale: Hurts even more Pain Location: R hip  Pain Descriptors / Indicators: Aching;Operative site guarding;Guarding Pain Intervention(s): Monitored during session;Limited activity within patient's tolerance;Repositioned;RN gave pain meds during session  Home Living                      Prior Function            PT Goals (current goals can now be found in the care plan section) Acute Rehab PT Goals Patient Stated Goal: to decrease pain  PT Goal Formulation: With patient Time For Goal Achievement: 03/03/17 Potential to Achieve Goals: Good Progress towards PT goals: Progressing toward  goals    Frequency    7X/week      PT Plan Current plan remains appropriate    Co-evaluation              AM-PAC PT "6 Clicks" Daily Activity  Outcome Measure  Difficulty turning over in bed (including adjusting bedclothes, sheets and blankets)?: A Little Difficulty moving from lying on back to sitting on the side of the bed? : Unable Difficulty sitting down on and standing up from a chair with arms (e.g., wheelchair, bedside commode, etc,.)?: Unable Help needed moving to and from a bed to chair (including a wheelchair)?: A Little Help needed walking in hospital room?: A Little Help needed climbing 3-5 steps with a railing? : A Lot 6 Click Score: 13    End of Session Equipment Utilized During Treatment: Gait belt Activity Tolerance: Patient limited by pain Patient left: in chair;with call bell/phone within reach Nurse Communication: Mobility status PT Visit Diagnosis: Other abnormalities of gait and mobility (R26.89);Pain;Unsteadiness on feet (R26.81) Pain - Right/Left: Right Pain - part of body: Hip     Time: 9794-8016 PT Time Calculation (min) (ACUTE ONLY): 33 min  Charges:  $Gait Training: 23-37 mins                    G Codes:       Benjiman Core, [place] Pager [phone] Acute Rehab   Allena Katz 02/25/2017, 9:30 AM

## 2017-02-25 NOTE — Progress Notes (Signed)
Physical Therapy Treatment Patient Details Name: Deanna Spencer MRN: [id number] DOB: 16-Dec-1940 Today's Date: 02/25/2017    History of Present Illness Pt is a 77 y/o female s/p elective R THA, anterior approach. PMH includes a fib, gout, RA, osteoporosis, bilateral TSA, bilateral TKA, lubar fusion, GI bleed.     PT Comments    Today's skilled session focused on gait training and stair negotiation. Pt ambulated 136ft in hall with improved gait quality from previous session. Pt negotiated 2 curb like steps with min guard for safety and cues for technique. Plan to focus on progressing HEP and gait in next session.  Will continue to follow acutely.   Follow Up Recommendations  Follow surgeon's recommendation for DC plan and follow-up therapies;Supervision for mobility/OOB     Equipment Recommendations  None recommended by PT    Recommendations for Other Services OT consult     Precautions / Restrictions Precautions Precautions: Anterior Hip Precaution Booklet Issued: Yes (comment) Precaution Comments: Pt unable to recall any hip precautions. Reviewed with pt. Restrictions Weight Bearing Restrictions: Yes RLE Weight Bearing: Weight bearing as tolerated    Mobility  Bed Mobility Overal bed mobility: Needs Assistance Bed Mobility: Supine to Sit     Supine to sit: Min guard     General bed mobility comments: Min guard for safety. Pt using UE to assist R LE off EOB  Transfers Overall transfer level: Needs assistance Equipment used: Rolling walker (2 wheeled) Transfers: Sit to/from Stand Sit to Stand: Min guard         General transfer comment: Pt requires increased time and effort to rise into standing. Cueing for technique and hand placement.   Ambulation/Gait Ambulation/Gait assistance: Min guard Ambulation Distance (Feet): 150 Feet Assistive device: Rolling walker (2 wheeled) Gait Pattern/deviations: Step-through pattern;Decreased step length - right;Decreased  stance time - left;Decreased stride length;Decreased weight shift to right;Antalgic Gait velocity: decreased Gait velocity interpretation: Below normal speed for age/gender General Gait Details: Pt with antalgic gait, but overall steady with RW. Min guard for safety. Cueing for postural control   Stairs Stairs: Yes   Stair Management: No rails;Step to pattern;Forwards;With walker Number of Stairs: 2 General stair comments: Pt negotiated curb like step x2 with min guard for safety. VCs for technique.  Wheelchair Mobility    Modified Rankin (Stroke Patients Only)       Balance Overall balance assessment: Needs assistance Sitting-balance support: No upper extremity supported;Feet supported Sitting balance-Leahy Scale: Good     Standing balance support: Bilateral upper extremity supported;During functional activity Standing balance-Leahy Scale: Poor Standing balance comment: Reliant on BUE support.                             Cognition Arousal/Alertness: Awake/alert Behavior During Therapy: WFL for tasks assessed/performed Overall Cognitive Status: Within Functional Limits for tasks assessed                                        Exercises      General Comments        Pertinent Vitals/Pain Pain Assessment: Faces Faces Pain Scale: Hurts little more Pain Location: R hip  Pain Descriptors / Indicators: Aching;Operative site guarding;Guarding Pain Intervention(s): Monitored during session;Limited activity within patient's tolerance    Home Living  Prior Function            PT Goals (current goals can now be found in the care plan section) Acute Rehab PT Goals Patient Stated Goal: to decrease pain  PT Goal Formulation: With patient Time For Goal Achievement: 03/03/17 Potential to Achieve Goals: Good Progress towards PT goals: Progressing toward goals    Frequency    7X/week      PT Plan Current  plan remains appropriate    Co-evaluation              AM-PAC PT "6 Clicks" Daily Activity  Outcome Measure  Difficulty turning over in bed (including adjusting bedclothes, sheets and blankets)?: A Little Difficulty moving from lying on back to sitting on the side of the bed? : Unable Difficulty sitting down on and standing up from a chair with arms (e.g., wheelchair, bedside commode, etc,.)?: Unable Help needed moving to and from a bed to chair (including a wheelchair)?: A Little Help needed walking in hospital room?: A Little Help needed climbing 3-5 steps with a railing? : A Little 6 Click Score: 14    End of Session Equipment Utilized During Treatment: Gait belt Activity Tolerance: Patient tolerated treatment well Patient left: in chair;with call bell/phone within reach Nurse Communication: Mobility status PT Visit Diagnosis: Other abnormalities of gait and mobility (R26.89);Pain;Unsteadiness on feet (R26.81) Pain - Right/Left: Right Pain - part of body: Hip     Time: 5102-5852 PT Time Calculation (min) (ACUTE ONLY): 25 min  Charges:  $Gait Training: 23-37 mins                    G Codes:      Benjiman Core, [place] Pager [phone] Acute Rehab  Allena Katz 02/25/2017, 2:19 PM

## 2017-02-25 NOTE — Evaluation (Signed)
Occupational Therapy Evaluation Patient Details Name: Deanna Spencer MRN: [id number] DOB: [birth year] Today's Date: 02/25/2017    History of Present Illness Pt is a 77 y/o female s/p elective R THA, anterior approach. PMH includes a fib, gout, RA, osteoporosis, bilateral TSA, bilateral TKA, lubar fusion, GI bleed.    Clinical Impression   PTA, pt was living with her daughter and was independent with ADLs using DME and AE. Pt currently performing LB dressing, toileting, and functional mobility with RW at [facility] guard level. Providing education on LB ADLs and toileting. Pt would benefit from further acute OT to address safe tub transfer. Recommend dc home once medically stable per physician.     Follow Up Recommendations  Supervision/Assistance - 24 hour;Follow surgeon's recommendation for DC plan and follow-up therapies    Equipment Recommendations  None recommended by OT    Recommendations for Other Services PT consult     Precautions / Restrictions Precautions Precautions: Anterior Hip Precaution Booklet Issued: Yes (comment) Precaution Comments: Recalling 3/3 hip precautions; just finished with PT upon arrival Restrictions Weight Bearing Restrictions: Yes RLE Weight Bearing: Weight bearing as tolerated      Mobility Bed Mobility Overal bed mobility: Needs Assistance Bed Mobility: Supine to Sit Rolling: Supervision Sidelying to sit: Supervision Supine to sit: Supervision     General bed mobility comments: supervision for safety. Increased time and effort to manage RLE  Transfers Overall transfer level: Needs assistance Equipment used: Rolling walker (2 wheeled) Transfers: Sit to/from Stand Sit to Stand: Min guard         General transfer comment: Min Guard for safety    Balance Overall balance assessment: Needs assistance Sitting-balance support: No upper extremity supported;Feet supported Sitting balance-Leahy Scale: Good     Standing balance support: Bilateral  upper extremity supported;During functional activity Standing balance-Leahy Scale: Poor Standing balance comment: Reliant on BUE support.                            ADL either performed or assessed with clinical judgement   ADL Overall ADL's : Needs assistance/impaired Eating/Feeding: Set up;Sitting   Grooming: Set up;Sitting   Upper Body Bathing: Set up;Supervision/ safety;Sitting   Lower Body Bathing: Min guard;Sit to/from stand;With adaptive equipment   Upper Body Dressing : Set up;Supervision/safety;Sitting   Lower Body Dressing: Min guard;With adaptive equipment;Sit to/from stand Lower Body Dressing Details (indicate cue type and reason): Min Guard for safety. Pt donning underwear with AE. Educating pt on compensatory tehcniques Toilet Transfer: Min guard;Ambulation;Regular Toilet;Grab bars;RW           Functional mobility during ADLs: Min guard;Rolling walker General ADL Comments: Pt demonstrating decreased funcitonal performance. Highly motivated to participate in therapy. Educating pt on LB ADLs and toileting. Will need further education for tub transfer.      Vision Baseline Vision/History: Wears glasses Patient Visual Report: No change from baseline       Perception     Praxis      Pertinent Vitals/Pain Pain Assessment: Faces Faces Pain Scale: Hurts little more Pain Location: R hip  Pain Descriptors / Indicators: Aching;Operative site guarding;Guarding Pain Intervention(s): Monitored during session;Limited activity within patient's tolerance;Repositioned     Hand Dominance Right   Extremity/Trunk Assessment Upper Extremity Assessment Upper Extremity Assessment: Overall WFL for tasks assessed   Lower Extremity Assessment Lower Extremity Assessment: Defer to PT evaluation   Cervical / Trunk Assessment Cervical / Trunk Assessment: Normal   Communication Communication  Communication: No difficulties   Cognition Arousal/Alertness:  Awake/alert Behavior During Therapy: WFL for tasks assessed/performed Overall Cognitive Status: Within Functional Limits for tasks assessed                                     General Comments       Exercises    Shoulder Instructions      Home Living Family/patient expects to be discharged to:: Private residence Living Arrangements: Children(Daughter) Available Help at Discharge: Family;Available 24 hours/day Type of Home: House Home Access: Stairs to enter [employer] of Steps: 1 Entrance Stairs-Rails: None Home Layout: One level     Bathroom Shower/Tub: Teacher, early years/pre: Standard     Home Equipment: Cane - single point;Shower seat;Grab bars - toilet;Grab bars - tub/shower;Walker - 2 wheels;Other (comment);Adaptive equipment(Bed rails) Adaptive Equipment: Reacher;Sock aid;Long-handled shoe horn;Long-handled sponge        Prior Functioning/Environment Level of Independence: Needs assistance  Gait / Transfers Assistance Needed: uses a cane for gait ADL's / Homemaking Assistance Needed: Pt's daughter provides supervision/assist for bathing ADLs, assist for socks and shoes            OT Problem List: Decreased strength;Decreased range of motion;Decreased activity tolerance;Impaired balance (sitting and/or standing);Decreased safety awareness;Decreased knowledge of use of DME or AE;Decreased knowledge of precautions;Pain      OT Treatment/Interventions: Self-care/ADL training;Therapeutic exercise;Energy conservation;DME and/or AE instruction;Therapeutic activities;Patient/family education    OT Goals(Current goals can be found in the care plan section) Acute Rehab OT Goals Patient Stated Goal: to decrease pain  OT Goal Formulation: With patient Time For Goal Achievement: 03/11/17 Potential to Achieve Goals: Good ADL Goals Pt Will Perform Lower Body Dressing: with modified independence;sit to/from stand;with adaptive  equipment Pt Will Transfer to Toilet: with modified independence;ambulating;regular height toilet;grab bars Pt Will Perform Toileting - Clothing Manipulation and hygiene: with modified independence;sit to/from stand Pt Will Perform Tub/Shower Transfer: Tub transfer;shower seat;rolling walker;with supervision;with set-up;ambulating  OT Frequency: Min 2X/week   Barriers to D/C:            Co-evaluation              AM-PAC PT "6 Clicks" Daily Activity     Outcome Measure Help from another person eating meals?: None Help from another person taking care of personal grooming?: None Help from another person toileting, which includes using toliet, bedpan, or urinal?: A Little Help from another person bathing (including washing, rinsing, drying)?: A Little Help from another person to put on and taking off regular upper body clothing?: None Help from another person to put on and taking off regular lower body clothing?: A Little 6 Click Score: 21   End of Session Equipment Utilized During Treatment: Gait belt;Rolling walker Nurse Communication: Mobility status;Precautions;Weight bearing status  Activity Tolerance: Patient tolerated treatment well Patient left: in bed;with call bell/phone within reach  OT Visit Diagnosis: Unsteadiness on feet (R26.81);Other abnormalities of gait and mobility (R26.89);Muscle weakness (generalized) (M62.81);Pain Pain - Right/Left: Right Pain - part of body: Leg                Time: 0539-7673 OT Time Calculation (min): 21 min Charges:  OT General Charges $OT Visit: 1 Visit OT Evaluation $OT Eval Moderate Complexity: 1 Mod G-Codes:     Jaelie Aguilera MSOT, OTR/L Acute Rehab Pager: [phone] Office: [place] 02/25/2017, 3:13 PM

## 2017-02-25 NOTE — Progress Notes (Signed)
Orthopedic Tech Progress Note Patient Details:  Deanna Spencer [birth year] [id number]  Patient ID: Deanna Spencer, female   DOB: Jun 10, 1940, 77 y.o.   MRN: [id number] Pt cant have ohf due to age restrictions  Karolee Stamps 02/25/2017, 3:08 AM

## 2017-02-25 NOTE — Care Management Note (Signed)
Case Management Note  Patient Details  Name: Deanna Spencer MRN: [id number] Date of Birth: [birth year]  Subjective/Objective:  77 yr old female s/p right total hip arthroplasty.                   Action/Plan: Case manager spoke with patient and her daughter concerning discharge plan and DME needs. Patient was preoperatively setup with [facility], no changes. Has RW and 3in1. Patient will have family support at discharge.    Expected Discharge Date:   02/26/17               Expected Discharge Plan:  [place]  In-House Referral:  NA  Discharge planning Services  CM Consult  Post Acute Care Choice:  Home Health Choice offered to:  Patient, Adult Children  DME Arranged:  (has RW and 3in1) DME Agency:  NA  HH Arranged:  PT [place] Agency:  [facility] (formerly [employer]  Status of Service:  Completed, signed off  If discussed at [employer], dates discussed:    Additional Comments:  Deanna Meeker, RN 02/25/2017, 10:26 AM

## 2017-02-26 LAB — CBC
HEMATOCRIT: 36.9 % (ref 36.0–46.0)
Hemoglobin: 11.6 g/dL — ABNORMAL LOW (ref 12.0–15.0)
MCH: 31.6 pg (ref 26.0–34.0)
MCHC: 31.4 g/dL (ref 30.0–36.0)
MCV: 100.5 fL — ABNORMAL HIGH (ref 78.0–100.0)
Platelets: 233 10*3/uL (ref 150–400)
RBC: 3.67 MIL/uL — ABNORMAL LOW (ref 3.87–5.11)
RDW: 16.4 % — AB (ref 11.5–15.5)
WBC: 12.9 10*3/uL — ABNORMAL HIGH (ref 4.0–10.5)

## 2017-02-26 NOTE — Progress Notes (Signed)
Patient was discharged to her home via wheel chair with her daughter.  Written and verbal discharge instructions given.  Patient verbalizes understanding.  Physical Therapist reported that therapy has been completed.  The patient states that she has all the equipment that she will need at home.  Patient has follow up instructions and agrees.

## 2017-02-26 NOTE — Discharge Summary (Signed)
Physician Discharge Summary  Patient ID: Deanna Spencer MRN: [id number] DOB/AGE: 75-Mar-1942 75 y.o.  Admit date: 02/24/2017 Discharge date: 02/26/2017  Admission Diagnoses:  Primary OA B hips  Discharge Diagnoses:  Active Problems:   Primary osteoarthritis of right hip   Primary osteoarthritis of left hip   Past Medical History:  Diagnosis Date  . AAA (abdominal aortic aneurysm) ([place])    3.5cm by CT 11/29/16  . Anemia   . Childhood asthma    connected to allergies  . Chronic lower back pain   . Diverticulosis   . Duodenal ulcer   . Dyspnea   . Dysrhythmia    a fib  . GERD (gastroesophageal reflux disease)   . GI bleed    "have had both upper and lower GIB"  . Gout   . Heart attack ([place]) 2017  . Heart murmur   . History of blood transfusion 2017-2018   "related to bleeding ulcers; 47 units PRBC; 2U plasma" (11/23/2016)  . History of hiatal hernia   . Hx of blood clots    in heart  . IBS (irritable bowel syndrome)   . Neuropathy   . Osteoarthritis   . Osteoporosis   . Peripheral neuropathy   . 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 Presence of Watchman left atrial appendage closure device   . Raynaud disease   . Rheumatoid arteritis   . SSS (sick sinus syndrome) ([place])    a. s/p Medtronic PPM placement in 2017  . Stomach ulcer     Surgeries: Procedure(s): TOTAL HIP ARTHROPLASTY on 02/24/2017   Consultants (if any):   Discharged Condition: Improved  Hospital Course: Deanna Spencer is an 77 y.o. female who was admitted 02/24/2017 with a diagnosis of R hip osteoarthritis and went to the operating room on 02/24/2017 and underwent the above named procedures.    She was given perioperative antibiotics:  Anti-infectives (From admission, onward)   Start     Dose/Rate Route Frequency Ordered Stop   02/24/17 0900  ceFAZolin (ANCEF) IVPB 2g/100 mL premix     2 g 200 mL/hr over 30 Minutes Intravenous To [facility]  02/23/17 1122 02/24/17 1123    .  She was given sequential compression devices, early ambulation for DVT prophylaxis.  She benefited maximally from the hospital stay and there were no complications.  Her postop course was unremarkable and at the time of her d/c, she was tolerating PO diet, ambulating, dressing C/D/I, and NVI.  Recent vital signs:  Vitals:   02/25/17 2025 02/26/17 0537  BP: 118/62 (!) 99/57  Pulse: 76 84  Resp: 16 16  Temp: 98 F (36.7 C) 97.8 F (36.6 C)  SpO2: 94% 99%    Recent laboratory studies:  Lab Results  Component Value Date   HGB 11.6 (L) 02/26/2017   HGB 11.3 (L) 02/25/2017   HGB 12.7 02/10/2017   Lab Results  Component Value Date   WBC 12.9 (H) 02/26/2017   PLT 233 02/26/2017   Lab Results  Component Value Date   INR 1.00 02/10/2017   Lab Results  Component Value Date   NA 137 02/25/2017   K 4.6 02/25/2017   CL 105 02/25/2017   CO2 21 (L) 02/25/2017   BUN 21 (H) 02/25/2017   CREATININE 1.20 (H) 02/25/2017   GLUCOSE 121 (H) 02/25/2017    Discharge Medications:   Allergies as of 02/26/2017      Reactions   Demerol [  meperidine] Anaphylaxis      Medication List    TAKE these medications   acetaminophen 500 MG tablet Commonly known as:  TYLENOL Take 1,000 mg every 6 (six) hours as needed by mouth (pain).   colchicine 0.6 MG tablet Take 0.6 mg 2 (two) times daily as needed by mouth (gout attacks).   ferrous sulfate 325 (65 FE) MG tablet You can resume this in a couple weeks after your feeling better. What changed:    how much to take  how to take this  when to take this  additional instructions   fesoterodine 4 MG Tb24 tablet Commonly known as:  TOVIAZ Take 1 tablet (4 mg total) by mouth daily.   gabapentin 300 MG capsule Commonly known as:  NEURONTIN Take 1 capsule (300 mg total) 3 (three) times daily by mouth.   HYDROcodone-acetaminophen 5-325 MG tablet Commonly known as:  NORCO/VICODIN You can resume using  this as instructed before;  when you are no longer taking plain Tylenol (acetaminophin) and Oxycodone. What changed:    how much to take  how to take this  when to take this  reasons to take this  additional instructions   levocetirizine 5 MG tablet Commonly known as:  XYZAL Take 1 tablet (5 mg total) by mouth every evening.   loperamide 2 MG capsule Commonly known as:  IMODIUM Take 2 mg 3 (three) times daily as needed by mouth for diarrhea or loose stools.   metoprolol succinate 100 MG 24 hr tablet Commonly known as:  TOPROL-XL Take 1 tablet (100 mg total) by mouth daily. Take with or immediately following a meal. What changed:    when to take this  additional instructions   oxyCODONE 5 MG immediate release tablet Commonly known as:  Oxy IR/ROXICODONE Take 1 tablet (5 mg total) by mouth every 4 (four) hours as needed for severe pain. What changed:    how much to take  how to take this  when to take this  reasons to take this  additional instructions   pantoprazole 40 MG tablet Commonly known as:  PROTONIX Take 1 tablet (40 mg total) by mouth 2 (two) times daily. What changed:    when to take this  reasons to take this   rOPINIRole 0.5 MG tablet Commonly known as:  REQUIP Take 1 tablet (0.5 mg total) 3 (three) times daily by mouth. What changed:    how much to take  when to take this  additional instructions   sucralfate 1 g tablet Commonly known as:  CARAFATE TAKE 1 TABLET(1 GRAM) BY MOUTH FOUR TIMES DAILY AT BEDTIME WITH MEALS   tiZANidine 2 MG tablet Commonly known as:  ZANAFLEX Take 1 tablet (2 mg total) by mouth every 6 (six) hours as needed for muscle spasms.            Durable Medical Equipment  (From admission, onward)        Start     Ordered   02/24/17 1631  DME Walker rolling  Once    Question:  Patient needs a walker to treat with the following condition  Answer:  Status post total hip replacement, left   02/24/17 1630    02/24/17 1631  DME 3 n 1  Once     02/24/17 1630   02/24/17 1631  DME Bedside commode  Once    Question:  Patient needs a bedside commode to treat with the following condition  Answer:  Status post total hip  replacement, left   02/24/17 1630      Diagnostic Studies: Dg Chest 2 View  Result Date: 02/24/2017 CLINICAL DATA:  Preop right hip surgery EXAM: CHEST  2 VIEW COMPARISON:  11/21/2016 FINDINGS: Bilateral shoulder replacements. Left pacer is in place, unchanged. Mild cardiomegaly. No confluent airspace opacities, effusions or edema. Eventration of the right hemidiaphragm. No acute bony abnormality. IMPRESSION: Cardiomegaly.  No active disease. Electronically Signed   By: Rolm Baptise M.D.   On: 02/24/2017 09:15   Dg C-arm 1-60 Min  Result Date: 02/24/2017 CLINICAL DATA:  Right hip replacement. EXAM: OPERATIVE RIGHT HIP (WITH PELVIS IF PERFORMED) 2 VIEWS TECHNIQUE: Fluoroscopic spot image(s) were submitted for interpretation post-operatively. COMPARISON:  CT 11/29/2016. FINDINGS: Total right hip replacement with anatomic alignment. Hardware intact. IMPRESSION: Total right hip replacement with anatomic alignment. Electronically Signed   By: Marcello Moores  Register   On: 02/24/2017 13:35   Dg Hip Operative Unilat With Pelvis Right  Result Date: 02/24/2017 CLINICAL DATA:  Right hip replacement. EXAM: OPERATIVE RIGHT HIP (WITH PELVIS IF PERFORMED) 2 VIEWS TECHNIQUE: Fluoroscopic spot image(s) were submitted for interpretation post-operatively. COMPARISON:  CT 11/29/2016. FINDINGS: Total right hip replacement with anatomic alignment. Hardware intact. IMPRESSION: Total right hip replacement with anatomic alignment. Electronically Signed   By: Marcello Moores  Register   On: 02/24/2017 13:35    Disposition: 01-Home or Self Care    Follow-up Information    Frederik Pear, MD Follow up in 2 week(s).   Specialty:  Orthopedic Surgery Contact information: [place] [address] [phone]         [facility] Follow [facility].   Specialty:  [place] Why:  A representative from [facility] will contact you to arrange start date and time for your therapy. Contact information: [address] [place] [address] [phone]            Signed: Jolyn Nap 02/26/2017, 8:30 AM

## 2017-02-26 NOTE — Progress Notes (Signed)
Physical Therapy Treatment Patient Details Name: Deanna Spencer MRN: [id number] DOB: [birth year] Today's Date: 02/26/2017    History of Present Illness Pt is a 77 y/o female s/p elective R THA, anterior approach. PMH includes a fib, gout, RA, osteoporosis, bilateral TSA, bilateral TKA, lubar fusion, GI bleed.     PT Comments    Continuing work on functional mobility and activity tolerance;  Ms. Coyne reports being set in her ways, and will often say, "let me show you how I do it"; Overall her techniques work for her, with a few cues/reminders for precautions; We discussed car transfers, and what to expect post-acutely; Questions solicited and answered; OK for dc home from PT standpoint    Follow Up Recommendations  Follow surgeon's recommendation for DC plan and follow-up therapies;Supervision for mobility/OOB     Equipment Recommendations  None recommended by PT    Recommendations for Other Services       Precautions / Restrictions Precautions Precautions: Anterior Hip Precaution Booklet Issued: Yes (comment) Precaution Comments: Recalling 3/3 hip precautions Restrictions Weight Bearing Restrictions: Yes RLE Weight Bearing: Weight bearing as tolerated    Mobility  Bed Mobility Overal bed mobility: Needs Assistance Bed Mobility: Sit to Supine     Supine to sit: Supervision Sit to supine: Supervision   General bed mobility comments: supervision for safety. Increased time and effort to manage RLE  Transfers Overall transfer level: Needs assistance Equipment used: Rolling walker (2 wheeled) Transfers: Sit to/from Stand Sit to Stand: Min guard         General transfer comment: Min Guard for safety  Ambulation/Gait Ambulation/Gait assistance: Min guard Ambulation Distance (Feet): 120 Feet Assistive device: Rolling walker (2 wheeled) Gait Pattern/deviations: Decreased stance time - right;Decreased step length - left;Step-through pattern(minimal step-through) Gait  velocity: decreased   General Gait Details: Overall steady with RW; Cues for posture and safety   Stairs     Stair Management: No rails;Backwards;With walker Number of Stairs: 1 General stair comments: Cues for technique  Wheelchair Mobility    Modified Rankin (Stroke Patients Only)       Balance Overall balance assessment: Needs assistance Sitting-balance support: No upper extremity supported;Feet supported Sitting balance-Leahy Scale: Good     Standing balance support: Bilateral upper extremity supported;During functional activity Standing balance-Leahy Scale: Fair Standing balance comment: Able to maintain static standing without UE support                            Cognition Arousal/Alertness: Awake/alert Behavior During Therapy: WFL for tasks assessed/performed Overall Cognitive Status: Within Functional Limits for tasks assessed                                        Exercises Total Joint Exercises Ankle Circles/Pumps: AROM;Both;20 reps Quad Sets: AROM;Right;10 reps Gluteal Sets: AROM;Both;10 reps Towel Squeeze: AROM;Both;10 reps Short Arc Quad: AROM;Right;10 reps Heel Slides: AROM;Right;10 reps Hip ABduction/ADduction: AROM;Right;10 reps;Other (comment)(Isometric hip abduction with belt around distal thigh)    General Comments General comments (skin integrity, edema, etc.): Daughter present throughotu session      Pertinent Vitals/Pain Pain Assessment: Faces Faces Pain Scale: Hurts little more Pain Location: R hip  Pain Descriptors / Indicators: Aching;Operative site guarding;Guarding Pain Intervention(s): Monitored during session    Home Living  Prior Function            PT Goals (current goals can now be found in the care plan section) Acute Rehab PT Goals Patient Stated Goal: to decrease pain  PT Goal Formulation: With patient Time For Goal Achievement: 03/03/17 Potential to Achieve  Goals: Good Progress towards PT goals: Progressing toward goals    Frequency    7X/week      PT Plan Current plan remains appropriate    Co-evaluation              AM-PAC PT "6 Clicks" Daily Activity  Outcome Measure  Difficulty turning over in bed (including adjusting bedclothes, sheets and blankets)?: A Little Difficulty moving from lying on back to sitting on the side of the bed? : A Little Difficulty sitting down on and standing up from a chair with arms (e.g., wheelchair, bedside commode, etc,.)?: A Little Help needed moving to and from a bed to chair (including a wheelchair)?: A Little Help needed walking in hospital room?: A Little Help needed climbing 3-5 steps with a railing? : A Little 6 Click Score: 18    End of Session Equipment Utilized During Treatment: Gait belt Activity Tolerance: Patient tolerated treatment well Patient left: with call bell/phone within reach;in bed Nurse Communication: Mobility status PT Visit Diagnosis: Other abnormalities of gait and mobility (R26.89);Pain;Unsteadiness on feet (R26.81) Pain - Right/Left: Right Pain - part of body: Hip     Time: 3474-2595 PT Time Calculation (min) (ACUTE ONLY): 28 min  Charges:  $Gait Training: 8-22 mins $Therapeutic Exercise: 8-22 mins                    G Codes:       Roney [place], PT  Acute Rehabilitation Services Pager [phone] Office [place] 02/26/2017, 11:36 AM

## 2017-02-26 NOTE — Progress Notes (Signed)
Occupational Therapy Treatment Patient Details Name: Deanna Spencer MRN: [id number] DOB: 1940-02-23 Today's Date: 02/26/2017    History of present illness Pt is a 77 y/o female s/p elective R THA, anterior approach. PMH includes a fib, gout, RA, osteoporosis, bilateral TSA, bilateral TKA, lubar fusion, GI bleed.    OT comments  Pt progressing towards established OT goals. Pt performing toileting with Min Guard for safety. Providing pt and daughter with education on safe tub transfer; pt performing with Min A for stability and safety. Answered all pt and family questions in preparation for dc home today. All acute OT needs met and continue to recommend dc home once medically stable.    Follow Up Recommendations  Supervision/Assistance - 24 hour;Follow surgeon's recommendation for DC plan and follow-up therapies    Equipment Recommendations  None recommended by OT    Recommendations for Other Services PT consult    Precautions / Restrictions Precautions Precautions: Anterior Hip Precaution Booklet Issued: Yes (comment) Precaution Comments: Recalling 3/3 hip precautions Restrictions Weight Bearing Restrictions: Yes RLE Weight Bearing: Weight bearing as tolerated       Mobility Bed Mobility Overal bed mobility: Needs Assistance Bed Mobility: Supine to Sit     Supine to sit: Supervision     General bed mobility comments: supervision for safety. Increased time and effort to manage RLE  Transfers Overall transfer level: Needs assistance Equipment used: Rolling walker (2 wheeled) Transfers: Sit to/from Stand Sit to Stand: Min guard         General transfer comment: Min Guard for safety    Balance Overall balance assessment: Needs assistance Sitting-balance support: No upper extremity supported;Feet supported Sitting balance-Leahy Scale: Good     Standing balance support: Bilateral upper extremity supported;During functional activity Standing balance-Leahy Scale:  Fair Standing balance comment: Able to maintain static standing without UE support                           ADL either performed or assessed with clinical judgement   ADL Overall ADL's : Needs assistance/impaired     Grooming: Set up;Oral care;Wash/dry hands;Standing;Supervision/safety                   Toilet Transfer: Min guard;Ambulation;RW;BSC   Toileting- Clothing Manipulation and Hygiene: Set up;Supervision/safety;Sit to/from stand   Tub/ Shower Transfer: Tub transfer;Minimal assistance;Ambulation;Shower Technical sales engineer Details (indicate cue type and reason): Provided education on safe tub trnasfer. Pt requiring Max cues and verbalizing that this is different than her transfers before surgery. Daughter present thorughout session Functional mobility during ADLs: Min guard;Rolling walker General ADL Comments: Focused session on funcitonal transfers. Pt performing toilet trasnfer with Min guard and tub trnasfer with Min A     Vision       Perception     Praxis      Cognition Arousal/Alertness: Awake/alert Behavior During Therapy: WFL for tasks assessed/performed Overall Cognitive Status: Within Functional Limits for tasks assessed                                          Exercises     Shoulder Instructions       General Comments Daughter present throughotu session    Pertinent Vitals/ Pain       Pain Assessment: Faces Faces Pain Scale: Hurts little more Pain Location: R hip  Pain  Descriptors / Indicators: Aching;Operative site guarding;Guarding Pain Intervention(s): Monitored during session;Limited activity within patient's tolerance;Repositioned  Home Living                                          Prior Functioning/Environment              Frequency  Min 2X/week        Progress Toward Goals  OT Goals(current goals can now be found in the care plan section)   Progress towards OT goals: Progressing toward goals  Acute Rehab OT Goals Patient Stated Goal: to decrease pain  OT Goal Formulation: With patient Time For Goal Achievement: 03/11/17 Potential to Achieve Goals: Good ADL Goals Pt Will Perform Lower Body Dressing: with modified independence;sit to/from stand;with adaptive equipment Pt Will Transfer to Toilet: with modified independence;ambulating;regular height toilet;grab bars Pt Will Perform Toileting - Clothing Manipulation and hygiene: with modified independence;sit to/from stand Pt Will Perform Tub/Shower Transfer: Tub transfer;shower seat;rolling walker;with supervision;with set-up;ambulating  Plan Discharge plan remains appropriate    Co-evaluation                 AM-PAC PT "6 Clicks" Daily Activity     Outcome Measure   Help from another person eating meals?: None Help from another person taking care of personal grooming?: None Help from another person toileting, which includes using toliet, bedpan, or urinal?: A Little Help from another person bathing (including washing, rinsing, drying)?: A Little Help from another person to put on and taking off regular upper body clothing?: None Help from another person to put on and taking off regular lower body clothing?: A Little 6 Click Score: 21    End of Session Equipment Utilized During Treatment: Gait belt;Rolling walker  OT Visit Diagnosis: Unsteadiness on feet (R26.81);Other abnormalities of gait and mobility (R26.89);Muscle weakness (generalized) (M62.81);Pain Pain - Right/Left: Right Pain - part of body: Leg   Activity Tolerance Patient tolerated treatment well   Patient Left with call bell/phone within reach;with family/visitor present(OOB with PT)   Nurse Communication Mobility status;Precautions;Weight bearing status        Time: 0086-7619 OT Time Calculation (min): 22 min  Charges: OT General Charges $OT Visit: 1 Visit OT Treatments $Self Care/Home  Management : 8-22 mins  [place], OTR/L Acute Rehab Pager: [phone] Office: [place] 02/26/2017, 9:52 AM

## 2017-02-27 ENCOUNTER — Encounter (HOSPITAL_COMMUNITY): Payer: Self-pay | Admitting: Orthopedic Surgery

## 2017-02-27 DIAGNOSIS — I5022 Chronic systolic (congestive) heart failure: Secondary | ICD-10-CM | POA: Diagnosis not present

## 2017-02-27 DIAGNOSIS — N3289 Other specified disorders of bladder: Secondary | ICD-10-CM | POA: Diagnosis not present

## 2017-02-27 DIAGNOSIS — I714 Abdominal aortic aneurysm, without rupture: Secondary | ICD-10-CM | POA: Diagnosis not present

## 2017-02-27 DIAGNOSIS — M1612 Unilateral primary osteoarthritis, left hip: Secondary | ICD-10-CM | POA: Diagnosis not present

## 2017-02-27 DIAGNOSIS — I495 Sick sinus syndrome: Secondary | ICD-10-CM | POA: Diagnosis not present

## 2017-02-27 DIAGNOSIS — Z471 Aftercare following joint replacement surgery: Secondary | ICD-10-CM | POA: Diagnosis not present

## 2017-02-28 DIAGNOSIS — I5022 Chronic systolic (congestive) heart failure: Secondary | ICD-10-CM | POA: Diagnosis not present

## 2017-02-28 DIAGNOSIS — I495 Sick sinus syndrome: Secondary | ICD-10-CM | POA: Diagnosis not present

## 2017-02-28 DIAGNOSIS — N3289 Other specified disorders of bladder: Secondary | ICD-10-CM | POA: Diagnosis not present

## 2017-02-28 DIAGNOSIS — Z471 Aftercare following joint replacement surgery: Secondary | ICD-10-CM | POA: Diagnosis not present

## 2017-02-28 DIAGNOSIS — M25551 Pain in right hip: Secondary | ICD-10-CM | POA: Diagnosis not present

## 2017-02-28 DIAGNOSIS — M1612 Unilateral primary osteoarthritis, left hip: Secondary | ICD-10-CM | POA: Diagnosis not present

## 2017-02-28 DIAGNOSIS — I714 Abdominal aortic aneurysm, without rupture: Secondary | ICD-10-CM | POA: Diagnosis not present

## 2017-03-01 DIAGNOSIS — N3289 Other specified disorders of bladder: Secondary | ICD-10-CM | POA: Diagnosis not present

## 2017-03-01 DIAGNOSIS — I495 Sick sinus syndrome: Secondary | ICD-10-CM | POA: Diagnosis not present

## 2017-03-01 DIAGNOSIS — M1612 Unilateral primary osteoarthritis, left hip: Secondary | ICD-10-CM | POA: Diagnosis not present

## 2017-03-01 DIAGNOSIS — I714 Abdominal aortic aneurysm, without rupture: Secondary | ICD-10-CM | POA: Diagnosis not present

## 2017-03-01 DIAGNOSIS — Z471 Aftercare following joint replacement surgery: Secondary | ICD-10-CM | POA: Diagnosis not present

## 2017-03-01 DIAGNOSIS — I5022 Chronic systolic (congestive) heart failure: Secondary | ICD-10-CM | POA: Diagnosis not present

## 2017-03-02 DIAGNOSIS — Z471 Aftercare following joint replacement surgery: Secondary | ICD-10-CM | POA: Diagnosis not present

## 2017-03-02 DIAGNOSIS — I5022 Chronic systolic (congestive) heart failure: Secondary | ICD-10-CM | POA: Diagnosis not present

## 2017-03-02 DIAGNOSIS — N3289 Other specified disorders of bladder: Secondary | ICD-10-CM | POA: Diagnosis not present

## 2017-03-02 DIAGNOSIS — I495 Sick sinus syndrome: Secondary | ICD-10-CM | POA: Diagnosis not present

## 2017-03-02 DIAGNOSIS — I714 Abdominal aortic aneurysm, without rupture: Secondary | ICD-10-CM | POA: Diagnosis not present

## 2017-03-02 DIAGNOSIS — M1612 Unilateral primary osteoarthritis, left hip: Secondary | ICD-10-CM | POA: Diagnosis not present

## 2017-03-03 DIAGNOSIS — I714 Abdominal aortic aneurysm, without rupture: Secondary | ICD-10-CM | POA: Diagnosis not present

## 2017-03-03 DIAGNOSIS — Z471 Aftercare following joint replacement surgery: Secondary | ICD-10-CM | POA: Diagnosis not present

## 2017-03-03 DIAGNOSIS — M1612 Unilateral primary osteoarthritis, left hip: Secondary | ICD-10-CM | POA: Diagnosis not present

## 2017-03-03 DIAGNOSIS — N3289 Other specified disorders of bladder: Secondary | ICD-10-CM | POA: Diagnosis not present

## 2017-03-03 DIAGNOSIS — I5022 Chronic systolic (congestive) heart failure: Secondary | ICD-10-CM | POA: Diagnosis not present

## 2017-03-03 DIAGNOSIS — I495 Sick sinus syndrome: Secondary | ICD-10-CM | POA: Diagnosis not present

## 2017-03-06 ENCOUNTER — Encounter (HOSPITAL_COMMUNITY): Payer: Self-pay | Admitting: Orthopedic Surgery

## 2017-03-06 DIAGNOSIS — I5022 Chronic systolic (congestive) heart failure: Secondary | ICD-10-CM | POA: Diagnosis not present

## 2017-03-06 DIAGNOSIS — Z471 Aftercare following joint replacement surgery: Secondary | ICD-10-CM | POA: Diagnosis not present

## 2017-03-06 DIAGNOSIS — I714 Abdominal aortic aneurysm, without rupture: Secondary | ICD-10-CM | POA: Diagnosis not present

## 2017-03-06 DIAGNOSIS — M1612 Unilateral primary osteoarthritis, left hip: Secondary | ICD-10-CM | POA: Diagnosis not present

## 2017-03-06 DIAGNOSIS — N3289 Other specified disorders of bladder: Secondary | ICD-10-CM | POA: Diagnosis not present

## 2017-03-06 DIAGNOSIS — I495 Sick sinus syndrome: Secondary | ICD-10-CM | POA: Diagnosis not present

## 2017-03-06 NOTE — Anesthesia Postprocedure Evaluation (Signed)
Anesthesia Post Note  Patient: Deanna Spencer  Procedure(s) Performed: TOTAL HIP ARTHROPLASTY (Right Hip)     Patient location during evaluation: PACU Anesthesia Type: Spinal Level of consciousness: oriented and awake and alert Pain management: pain level controlled Vital Signs Assessment: post-procedure vital signs reviewed and stable Respiratory status: spontaneous breathing, respiratory function stable and patient connected to nasal cannula oxygen Cardiovascular status: blood pressure returned to baseline and stable Postop Assessment: no headache, no backache, no apparent nausea or vomiting and spinal receding Anesthetic complications: no    Last Vitals:  Vitals:   02/25/17 2025 02/26/17 0537  BP: 118/62 (!) 99/57  Pulse: 76 84  Resp: 16 16  Temp: 36.7 C 36.6 C  SpO2: 94% 99%    Last Pain:  Vitals:   02/26/17 0537  TempSrc: Oral  PainSc:                  Catalina Gravel

## 2017-03-07 ENCOUNTER — Ambulatory Visit: Payer: Medicare Other | Admitting: Cardiology

## 2017-03-08 ENCOUNTER — Encounter: Payer: Self-pay | Admitting: Cardiology

## 2017-03-09 DIAGNOSIS — Z471 Aftercare following joint replacement surgery: Secondary | ICD-10-CM | POA: Diagnosis not present

## 2017-03-09 DIAGNOSIS — M1612 Unilateral primary osteoarthritis, left hip: Secondary | ICD-10-CM | POA: Diagnosis not present

## 2017-03-09 DIAGNOSIS — N3289 Other specified disorders of bladder: Secondary | ICD-10-CM | POA: Diagnosis not present

## 2017-03-09 DIAGNOSIS — I5022 Chronic systolic (congestive) heart failure: Secondary | ICD-10-CM | POA: Diagnosis not present

## 2017-03-09 DIAGNOSIS — I495 Sick sinus syndrome: Secondary | ICD-10-CM | POA: Diagnosis not present

## 2017-03-09 DIAGNOSIS — I714 Abdominal aortic aneurysm, without rupture: Secondary | ICD-10-CM | POA: Diagnosis not present

## 2017-03-10 DIAGNOSIS — M25561 Pain in right knee: Secondary | ICD-10-CM | POA: Diagnosis not present

## 2017-03-13 DIAGNOSIS — R262 Difficulty in walking, not elsewhere classified: Secondary | ICD-10-CM | POA: Diagnosis not present

## 2017-03-13 DIAGNOSIS — M25551 Pain in right hip: Secondary | ICD-10-CM | POA: Diagnosis not present

## 2017-03-15 DIAGNOSIS — R262 Difficulty in walking, not elsewhere classified: Secondary | ICD-10-CM | POA: Diagnosis not present

## 2017-03-15 DIAGNOSIS — M25551 Pain in right hip: Secondary | ICD-10-CM | POA: Diagnosis not present

## 2017-03-17 ENCOUNTER — Encounter: Payer: Medicare Other | Admitting: Cardiology

## 2017-03-21 DIAGNOSIS — M25561 Pain in right knee: Secondary | ICD-10-CM | POA: Diagnosis not present

## 2017-03-23 DIAGNOSIS — R262 Difficulty in walking, not elsewhere classified: Secondary | ICD-10-CM | POA: Diagnosis not present

## 2017-03-23 DIAGNOSIS — M25551 Pain in right hip: Secondary | ICD-10-CM | POA: Diagnosis not present

## 2017-03-24 ENCOUNTER — Telehealth: Payer: Self-pay

## 2017-03-24 NOTE — Telephone Encounter (Signed)
Patient left message asking to speak with Dr Garlan Fillers about prescribing something for her chest cold. Stated that "he will know me". Danley Danker, RN [facility] RN)

## 2017-03-30 DIAGNOSIS — R262 Difficulty in walking, not elsewhere classified: Secondary | ICD-10-CM | POA: Diagnosis not present

## 2017-03-30 DIAGNOSIS — M25551 Pain in right hip: Secondary | ICD-10-CM | POA: Diagnosis not present

## 2017-04-11 ENCOUNTER — Other Ambulatory Visit: Payer: Self-pay | Admitting: Family Medicine

## 2017-04-11 DIAGNOSIS — Z96641 Presence of right artificial hip joint: Secondary | ICD-10-CM | POA: Diagnosis not present

## 2017-04-11 DIAGNOSIS — M222X1 Patellofemoral disorders, right knee: Secondary | ICD-10-CM | POA: Diagnosis not present

## 2017-04-11 DIAGNOSIS — R262 Difficulty in walking, not elsewhere classified: Secondary | ICD-10-CM | POA: Diagnosis not present

## 2017-04-11 DIAGNOSIS — M25551 Pain in right hip: Secondary | ICD-10-CM | POA: Diagnosis not present

## 2017-04-11 DIAGNOSIS — M1711 Unilateral primary osteoarthritis, right knee: Secondary | ICD-10-CM | POA: Diagnosis not present

## 2017-04-18 ENCOUNTER — Other Ambulatory Visit: Payer: Self-pay

## 2017-04-18 MED ORDER — COLCHICINE 0.6 MG PO TABS: 0.6000 mg | ORAL_TABLET | Freq: Two times a day (BID) | ORAL | 0 refills | Status: DC | PRN

## 2017-04-18 NOTE — Telephone Encounter (Signed)
Requesting refill of colchicine for gout flare. [facility] [address] Wallace Cullens, RN

## 2017-04-21 ENCOUNTER — Other Ambulatory Visit: Payer: Self-pay | Admitting: Orthopedic Surgery

## 2017-04-27 ENCOUNTER — Encounter (INDEPENDENT_AMBULATORY_CARE_PROVIDER_SITE_OTHER): Payer: Medicare Other | Admitting: Ophthalmology

## 2017-04-27 DIAGNOSIS — H43813 Vitreous degeneration, bilateral: Secondary | ICD-10-CM

## 2017-04-27 DIAGNOSIS — H26493 Other secondary cataract, bilateral: Secondary | ICD-10-CM | POA: Diagnosis not present

## 2017-05-11 ENCOUNTER — Other Ambulatory Visit: Payer: Self-pay | Admitting: Family Medicine

## 2017-05-18 ENCOUNTER — Other Ambulatory Visit: Payer: Self-pay | Admitting: Family Medicine

## 2017-05-18 ENCOUNTER — Telehealth: Payer: Self-pay

## 2017-05-18 MED ORDER — ROPINIROLE HCL 0.5 MG PO TABS: 0.5000 mg | ORAL_TABLET | Freq: Three times a day (TID) | ORAL | 3 refills | Status: DC

## 2017-05-18 NOTE — Progress Notes (Unsigned)
Called patient and spoke to her over the phone. Refilling this medication that she was getting from her old PCP. Sending to [facility] in [place] as she is there visting friends temporarily.

## 2017-05-18 NOTE — Telephone Encounter (Signed)
Pt called nurse line, would like to speak to Dr. Garlan Fillers- would only say that it is regarding a medication she needs ordered, but that she needs to talk to MD first. Pt call back [phone] Wallace Cullens, RN

## 2017-05-22 ENCOUNTER — Other Ambulatory Visit: Payer: Self-pay

## 2017-05-22 MED ORDER — ROPINIROLE HCL 0.5 MG PO TABS: ORAL_TABLET | ORAL | 3 refills | Status: DC

## 2017-05-24 ENCOUNTER — Other Ambulatory Visit: Payer: Self-pay | Admitting: Orthopedic Surgery

## 2017-05-24 NOTE — Care Plan (Signed)
Spoke with patient and daughter prior to surgery. Known to me from previous surgery. Will discharge to home with HHPT and transition to OPPT at [facility] on 06/15/17 after her MD appointment at 930.  She has all needed equipment at home   Please contact Ladell Heads, [place] with questions or if this plan needs to change.   Thanks

## 2017-05-26 NOTE — Pre-Procedure Instructions (Addendum)
Deanna Spencer  05/26/2017      [facility] [place] [place] [address] [place] [address] Phone: [phone] Fax: [phone]  [facility] [place], [place] - [address] AT [place] [address] Phone: [phone] Fax: [phone]    Your procedure is scheduled on  Friday 06/02/17  Report to [facility] Admitting at 530 A.M.  Call this number if you have problems the morning of surgery:  [phone]   Remember:  No food OR DRINK after midnight.     Take these medicines the morning of surgery with A SIP OF WATER-  COLCHICINE, GABAPENTIN, HYDROCODONE IF NEEDED, TOVIAZ   7 days prior to surgery STOP taking any Aspirin(unless otherwise instructed by your surgeon), Aleve, Naproxen, Ibuprofen, Motrin, Advil, Goody's, BC's, all herbal medications, fish oil, and all vitamins   Do not wear jewelry, make-up or nail polish.  Do not wear lotions, powders, or perfumes, or deodorant.  Do not shave 48 hours prior to surgery.  Men may shave face and neck.  Do not bring valuables to the hospital.  [facility] is not responsible for any belongings or valuables.  Contacts, dentures or bridgework may not be worn into surgery.  Leave your suitcase in the car.  After surgery it may be brought to your room.  For patients admitted to the hospital, discharge time will be determined by your treatment team.  Patients discharged the day of surgery will not be allowed to drive home.   Name and phone number of your driver:    Special instructions:  [place] - Preparing for Surgery  Before surgery, you can play an important role.  Because skin is not sterile, your skin needs to be as free of germs as possible.  You can reduce the number of germs on you skin by washing with CHG (chlorahexidine gluconate) soap before surgery.   CHG is an antiseptic cleaner which kills germs and bonds with the skin to continue killing germs even after washing.  Oral Hygiene is also important in reducing the risk of infection.  Remember to brush your teeth the morning of surgery.  Please DO NOT use if you have an allergy to CHG or antibacterial soaps.  If your skin becomes reddened/irritated stop using the CHG and inform your nurse when you arrive at Short Stay.  Do not shave (including legs and underarms) for at least 48 hours prior to the first CHG shower.  You may shave your face.  Please follow these instructions carefully:   1.  Shower with CHG Soap the night before surgery and the morning of Surgery.  2.  If you choose to wash your hair, wash your hair first as usual with your normal shampoo.  3.  After you shampoo, rinse your hair and body thoroughly to remove the shampoo. 4.  Use CHG as you would any other liquid soap.  You can apply chg directly to the skin and wash gently with a      scrungie or washcloth.           5.  Apply the CHG Soap to your body ONLY FROM THE NECK DOWN.   Do not use on open wounds or open sores. Avoid contact with your eyes, ears, mouth and genitals (private parts).  Wash genitals (  private parts) with your normal soap.  6.  Wash thoroughly, paying special attention to the area where your surgery will be performed.  7.  Thoroughly rinse your body with warm water from the neck down.  8.  DO NOT shower/wash with your normal soap after using and rinsing off the CHG Soap.  9.  Pat yourself dry with a clean towel.            10.  Wear clean pajamas.            11.  Place clean sheets on your bed the night of your first shower and do not sleep with pets.  Day of Surgery  Do not apply any lotions/deoderants the morning of surgery.   Please wear clean clothes to the hospital/surgery center. Remember to brush your teeth.     Please read over the following fact sheets that you were given. MRSA Information  and Surgical Site Infection Prevention

## 2017-05-29 ENCOUNTER — Encounter (HOSPITAL_COMMUNITY): Payer: Self-pay

## 2017-05-29 ENCOUNTER — Other Ambulatory Visit: Payer: Self-pay

## 2017-05-29 ENCOUNTER — Encounter (HOSPITAL_COMMUNITY)
Admission: RE | Admit: 2017-05-29 | Discharge: 2017-05-29 | Disposition: A | Discharge status: Home / Self Care | Payer: Medicare Other | Source: Ambulatory Visit | Attending: Orthopedic Surgery | Admitting: Orthopedic Surgery

## 2017-05-29 DIAGNOSIS — I7300 Raynaud's syndrome without gangrene: Secondary | ICD-10-CM | POA: Diagnosis not present

## 2017-05-29 DIAGNOSIS — G629 Polyneuropathy, unspecified: Secondary | ICD-10-CM | POA: Insufficient documentation

## 2017-05-29 DIAGNOSIS — M1711 Unilateral primary osteoarthritis, right knee: Secondary | ICD-10-CM | POA: Diagnosis not present

## 2017-05-29 DIAGNOSIS — I495 Sick sinus syndrome: Secondary | ICD-10-CM | POA: Diagnosis not present

## 2017-05-29 DIAGNOSIS — I252 Old myocardial infarction: Secondary | ICD-10-CM | POA: Diagnosis not present

## 2017-05-29 DIAGNOSIS — I714 Abdominal aortic aneurysm, without rupture: Secondary | ICD-10-CM | POA: Insufficient documentation

## 2017-05-29 DIAGNOSIS — Z79899 Other long term (current) drug therapy: Secondary | ICD-10-CM | POA: Insufficient documentation

## 2017-05-29 DIAGNOSIS — K219 Gastro-esophageal reflux disease without esophagitis: Secondary | ICD-10-CM | POA: Diagnosis not present

## 2017-05-29 DIAGNOSIS — Z8719 Personal history of other diseases of the digestive system: Secondary | ICD-10-CM | POA: Diagnosis not present

## 2017-05-29 DIAGNOSIS — K589 Irritable bowel syndrome without diarrhea: Secondary | ICD-10-CM | POA: Diagnosis not present

## 2017-05-29 DIAGNOSIS — Z96653 Presence of artificial knee joint, bilateral: Secondary | ICD-10-CM | POA: Insufficient documentation

## 2017-05-29 DIAGNOSIS — Z86718 Personal history of other venous thrombosis and embolism: Secondary | ICD-10-CM | POA: Insufficient documentation

## 2017-05-29 DIAGNOSIS — Z01812 Encounter for preprocedural laboratory examination: Secondary | ICD-10-CM | POA: Diagnosis not present

## 2017-05-29 DIAGNOSIS — Z950 Presence of cardiac pacemaker: Secondary | ICD-10-CM | POA: Insufficient documentation

## 2017-05-29 DIAGNOSIS — Z9841 Cataract extraction status, right eye: Secondary | ICD-10-CM | POA: Diagnosis not present

## 2017-05-29 DIAGNOSIS — Z96619 Presence of unspecified artificial shoulder joint: Secondary | ICD-10-CM | POA: Diagnosis not present

## 2017-05-29 DIAGNOSIS — Z9049 Acquired absence of other specified parts of digestive tract: Secondary | ICD-10-CM | POA: Insufficient documentation

## 2017-05-29 DIAGNOSIS — Z96641 Presence of right artificial hip joint: Secondary | ICD-10-CM | POA: Insufficient documentation

## 2017-05-29 DIAGNOSIS — Z90710 Acquired absence of both cervix and uterus: Secondary | ICD-10-CM | POA: Diagnosis not present

## 2017-05-29 DIAGNOSIS — Z79891 Long term (current) use of opiate analgesic: Secondary | ICD-10-CM | POA: Diagnosis not present

## 2017-05-29 DIAGNOSIS — Z8709 Personal history of other diseases of the respiratory system: Secondary | ICD-10-CM | POA: Insufficient documentation

## 2017-05-29 DIAGNOSIS — Z9884 Bariatric surgery status: Secondary | ICD-10-CM | POA: Diagnosis not present

## 2017-05-29 DIAGNOSIS — Z9842 Cataract extraction status, left eye: Secondary | ICD-10-CM | POA: Insufficient documentation

## 2017-05-29 DIAGNOSIS — I4891 Unspecified atrial fibrillation: Secondary | ICD-10-CM | POA: Insufficient documentation

## 2017-05-29 LAB — URINALYSIS, ROUTINE W REFLEX MICROSCOPIC
BACTERIA UA: NONE SEEN
BILIRUBIN URINE: NEGATIVE
Glucose, UA: NEGATIVE mg/dL
KETONES UR: NEGATIVE mg/dL
NITRITE: NEGATIVE
PROTEIN: NEGATIVE mg/dL
SPECIFIC GRAVITY, URINE: 1.018 (ref 1.005–1.030)
pH: 5 (ref 5.0–8.0)

## 2017-05-29 LAB — CBC WITH DIFFERENTIAL/PLATELET
ABS IMMATURE GRANULOCYTES: 0 10*3/uL (ref 0.0–0.1)
BASOS PCT: 1 %
Basophils Absolute: 0.1 10*3/uL (ref 0.0–0.1)
Eosinophils Absolute: 0.2 10*3/uL (ref 0.0–0.7)
Eosinophils Relative: 2 %
HCT: 39.7 % (ref 36.0–46.0)
Hemoglobin: 11.9 g/dL — ABNORMAL LOW (ref 12.0–15.0)
IMMATURE GRANULOCYTES: 0 %
Lymphocytes Relative: 16 %
Lymphs Abs: 1.1 10*3/uL (ref 0.7–4.0)
MCH: 29.8 pg (ref 26.0–34.0)
MCHC: 30 g/dL (ref 30.0–36.0)
MCV: 99.5 fL (ref 78.0–100.0)
MONOS PCT: 7 %
Monocytes Absolute: 0.5 10*3/uL (ref 0.1–1.0)
NEUTROS ABS: 5.1 10*3/uL (ref 1.7–7.7)
NEUTROS PCT: 74 %
Platelets: 355 10*3/uL (ref 150–400)
RBC: 3.99 MIL/uL (ref 3.87–5.11)
RDW: 13.3 % (ref 11.5–15.5)
WBC: 7 10*3/uL (ref 4.0–10.5)

## 2017-05-29 LAB — BASIC METABOLIC PANEL
ANION GAP: 13 (ref 5–15)
BUN: 12 mg/dL (ref 6–20)
CALCIUM: 9 mg/dL (ref 8.9–10.3)
CHLORIDE: 106 mmol/L (ref 101–111)
CO2: 22 mmol/L (ref 22–32)
Creatinine, Ser: 0.91 mg/dL (ref 0.44–1.00)
GFR calc non Af Amer: >60 mL/min (ref >60)
Glucose, Bld: 124 mg/dL — ABNORMAL HIGH (ref 65–99)
Potassium: 4.2 mmol/L (ref 3.5–5.1)
SODIUM: 141 mmol/L (ref 135–145)

## 2017-05-29 LAB — SURGICAL PCR SCREEN
MRSA, PCR: NEGATIVE
STAPHYLOCOCCUS AUREUS: POSITIVE — AB

## 2017-05-29 LAB — TYPE AND SCREEN
ABO/RH(D): A POS
ANTIBODY SCREEN: NEGATIVE

## 2017-05-29 LAB — APTT: APTT: 32 s (ref 24–36)

## 2017-05-29 LAB — PROTIME-INR
INR: 0.95
Prothrombin Time: 12.6 seconds (ref 11.4–15.2)

## 2017-05-29 NOTE — Progress Notes (Addendum)
Pt. Recently moved from [facility] [place] [phone] Dr. Cherly Anderson placed pt. Watchman device # [phone] Dr. Mearl Latin GI doctor [phone]. Pt. Also has pacemaker Device clinic phone number [phone]  Joylene Igo [phone]   PCP is Dr. Elicia Lamp 04/2017 Cardio here in [facility] [place], [place]) EKG 11/2016 Echo 11/2016 Stress 02/03/2017 Nothing has changed medically or surgically since 01/2017 Note sent to Mosie Epstein, [employer] rep.

## 2017-05-30 DIAGNOSIS — M1711 Unilateral primary osteoarthritis, right knee: Secondary | ICD-10-CM | POA: Diagnosis present

## 2017-05-30 HISTORY — DX: Unilateral primary osteoarthritis, right knee: M17.11

## 2017-05-30 NOTE — H&P (Signed)
TOTAL KNEE REVISION ADMISSION H&P  Patient is being admitted for right revision total knee arthroplasty.  Subjective:  Chief Complaint:right knee pain.  HPI: Deanna Spencer, 77 y.o. female, has a history of pain and functional disability in the right knee(s) due to arthritis and patient has failed non-surgical conservative treatments for greater than 12 weeks to include NSAID's and/or analgesics, corticosteriod injections, flexibility and strengthening excercises, supervised PT with diminished ADL's post treatment, use of assistive devices and activity modification. The indications for the revision of the total knee arthroplasty are patella femoral arthritis. Onset of symptoms was gradual starting several years ago with gradually worsening course since that time.  Prior procedures on the right knee(s) include arthroplasty.  Patient currently rates pain in the right knee(s) at 10 out of 10 with activity. There is night pain, worsening of pain with activity and weight bearing, pain that interferes with activities of daily living and pain with passive range of motion.  Patient has evidence of joint space narrowing by imaging studies. This condition presents safety issues increasing the risk of falls.   There is no current active infection.  Patient Active Problem List   Diagnosis Date Noted  . Primary osteoarthritis of left hip 02/24/2017  . AAA (abdominal aortic aneurysm) without rupture ([place]) 12/21/2016  . Chronic systolic heart failure ([place]) 12/14/2016  . S/P exploratory laparotomy 11/22/2016  . Perforated viscus 11/22/2016  . Atrial fibrillation ([place]) 11/22/2016  . Peripheral neuropathy 11/22/2016  . GERD (gastroesophageal reflux disease) 11/22/2016  . Other chronic pain 11/18/2016  . 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 History of heart attack 11/18/2016  . Parasomnia 11/18/2016  . Need for immunization against influenza 11/18/2016  . Encounter to  establish care 11/18/2016  . History of duodenal ulcer 11/18/2016   Past Medical History:  Diagnosis Date  . AAA (abdominal aortic aneurysm) ([place])    3.5cm by CT 11/29/16  . Anemia   . Childhood asthma    connected to allergies  . Chronic lower back pain   . Diverticulosis   . Duodenal ulcer   . Dyspnea   . Dysrhythmia    a fib  . GERD (gastroesophageal reflux disease)   . GI bleed    "have had both upper and lower GIB"  . Gout   . Heart attack ([place]) 2017  . Heart murmur   . History of blood transfusion 2017-2018   "related to bleeding ulcers; 47 units PRBC; 2U plasma" (11/23/2016)  . History of hiatal hernia   . Hx of blood clots    in heart  . IBS (irritable bowel syndrome)   . Neuropathy   . Osteoarthritis   . Osteoporosis   . Peripheral neuropathy   . 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 Presence of Watchman left atrial appendage closure device   . Raynaud disease   . Rheumatoid arteritis   . SSS (sick sinus syndrome) ([place])    a. s/p Medtronic PPM placement in 2017  . Stomach ulcer     Past Surgical History:  Procedure Laterality Date  . ABDOMINAL HYSTERECTOMY    . APPENDECTOMY    . CARPAL TUNNEL RELEASE Bilateral   . CATARACT EXTRACTION W/ INTRAOCULAR LENS  IMPLANT, BILATERAL Bilateral   . CHOLECYSTECTOMY OPEN    . DILATION AND CURETTAGE OF UTERUS    . FOOT FUSION Right   . INGUINAL HERNIA REPAIR Right X 2  .  INSERT / REPLACE / REMOVE PACEMAKER  2017   medtronic  . JOINT REPLACEMENT    . LAPAROTOMY N/A 11/21/2016   Procedure: EXPLORATORY LAPAROTOMY, REVISION OF  GASTROJEJUNOSTOMY, ESOPHAGOGASTROSCOPY;  Surgeon: Clovis Riley, MD;  Location: [facility];  Service: General;  Laterality: N/A;  . LEFT ATRIAL APPENDAGE OCCLUSION  2018   placed  via groin  . LUMBAR FUSION  X 2  . REVERSE SHOULDER ARTHROPLASTY Left    reverse shoulder replacement  . ROUX-EN-Y GASTRIC BYPASS  2005  . ROUX-EN-Y PROCEDURE  20017 X 2    "revisions"  . TONSILLECTOMY AND ADENOIDECTOMY    . TOTAL HIP ARTHROPLASTY Right 02/24/2017   Procedure: TOTAL HIP ARTHROPLASTY;  Surgeon: Earlie Server, MD;  Location: [facility];  Service: Orthopedics;  Laterality: Right;  . TOTAL KNEE ARTHROPLASTY Bilateral   . TOTAL SHOULDER ARTHROPLASTY Bilateral   . TUBAL LIGATION      No current facility-administered medications for this encounter.    Current Outpatient Medications  Medication Sig Dispense Refill Last Dose  . acetaminophen (TYLENOL) 500 MG tablet Take 1,000 mg every 6 (six) hours as needed by mouth (pain).    02/24/2017 at 0730  . colchicine 0.6 MG tablet Take 1 tablet (0.6 mg total) by mouth 2 (two) times daily as needed (gout attacks). 30 tablet 0   . ferrous sulfate 325 (65 FE) MG tablet You can resume this in a couple weeks after your feeling better. (Patient taking differently: Take 325 mg by mouth daily. )  3 02/23/2017 at Unknown time  . gabapentin (NEURONTIN) 300 MG capsule TAKE 1 CAPSULE(300 MG) BY MOUTH THREE TIMES DAILY 90 capsule 0   . HYDROcodone-acetaminophen (NORCO/VICODIN) 5-325 MG tablet You can resume using this as instructed before;  when you are no longer taking plain Tylenol (acetaminophin) and Oxycodone. (Patient taking differently: Take 1 tablet by mouth 2 (two) times daily as needed (for pain.). You can resume using this as instructed before;  when you are no longer taking plain Tylenol (acetaminophin) and Oxycodone.) 30 tablet 0 More than a month at Unknown time  . rOPINIRole (REQUIP) 0.5 MG tablet TAKE 1 TABLET(0.5 MG) BY MOUTH THREE TIMES DAILY (Patient taking differently: Take 0.5-1 mg by mouth. TAKE 1 TABLET(0.5 MG) IN THE AFTERNOON, AND 2 TABLETS (1MG ) AT BEDTIME) 270 tablet 3   . sucralfate (CARAFATE) 1 g tablet TAKE 1 TABLET(1 GRAM) BY MOUTH FOUR TIMES DAILY AT BEDTIME WITH MEALS 368 tablet 3 02/24/2017 at 0730  . TOVIAZ 4 MG TB24 tablet TAKE 1 TABLET(4 MG) BY MOUTH DAILY 30 tablet 0   . traMADol (ULTRAM) 50 MG  tablet Take 50 mg by mouth every 6 (six) hours as needed.      Allergies  Allergen Reactions  . Demerol [Meperidine] Anaphylaxis    Social History   Tobacco Use  . Smoking status: Never Smoker  . Smokeless tobacco: Never Used  Substance Use Topics  . Alcohol use: No    Frequency: Never    Comment: very rare    Family History  Problem Relation Age of Onset  . Heart disease Mother   . Liver disease Father   . Ulcers Father   . Prostate cancer Brother   . Colon cancer Neg Hx       Review of Systems  Constitutional: Negative.   HENT: Negative.   Eyes: Negative.   Respiratory: Negative.   Cardiovascular:       Htn  Gastrointestinal:  Ulcers  Genitourinary: Negative.   Musculoskeletal: Positive for joint pain.  Skin: Negative.   Neurological: Negative.   Endo/Heme/Allergies: Negative.   Psychiatric/Behavioral: Negative.      Objective:  Physical Exam  Constitutional: She is oriented to person, place, and time. She appears well-developed and well-nourished.  HENT:  Head: Normocephalic and atraumatic.  Eyes: Pupils are equal, round, and reactive to light.  Neck: Normal range of motion. Neck supple.  Cardiovascular: Intact distal pulses.  Respiratory: Effort normal.  Musculoskeletal:  Patient's right knee does have pain with range of motion including terminal flexion and extension.  She has a range from 0-120.  No instability with valgus or varus stress.  Calves are soft and nontender.    Neurological: She is alert and oriented to person, place, and time.  Skin: Skin is warm and dry.  Psychiatric: She has a normal mood and affect. Her behavior is normal. Judgment and thought content normal.    Vital signs in last 24 hours: Temp:  [97.7 F (36.5 C)] 97.7 F (36.5 C) (05/20 0846) Pulse Rate:  [78] 78 (05/20 0846) Resp:  [20] 20 (05/20 0846) BP: (122)/(80) 122/80 (05/20 0846) SpO2:  [100 %] 100 % (05/20 0846) Weight:  [56.8 kg (125 lb 3.2 oz)] 56.8 kg (125  lb 3.2 oz) (05/20 0846)  Labs:  Estimated body mass index is 25.29 kg/m as calculated from the following:   Height as of 05/29/17: 4\' 11"  (1.499 m).   Weight as of 05/29/17: 56.8 kg (125 lb 3.2 oz).  Imaging Review Plain radiographs demonstrate bilateral AP weightbearing, lateral sunrise views of the right knee are taken and reviewed in office today.  The patient's right total knee appears to be well-placed and well fixed however, at the time of her surgery they did not replace the patella and it appears that she may be having erosion of the patella.  Patient does have a left total knee arthroplasty with tibial screws.    Preoperative templating of the joint replacement has been completed, documented, and submitted to the Operating Room personnel in order to optimize intra-operative equipment management.   Assessment/Plan:  End stage arthritis, right knee(s) with failed previous arthroplasty.   The patient history, physical examination, clinical judgment of the provider and imaging studies are consistent with end stage degenerative joint disease of the right knee(s), previous total knee arthroplasty. Revision total knee arthroplasty is deemed medically necessary. The treatment options including medical management, injection therapy, arthroscopy and revision arthroplasty were discussed at length. The risks and benefits of revision total knee arthroplasty were presented and reviewed. The risks due to aseptic loosening, infection, stiffness, patella tracking problems, thromboembolic complications and other imponderables were discussed. The patient acknowledged the explanation, agreed to proceed with the plan and consent was signed. Patient is being admitted for inpatient treatment for surgery, pain control, PT, OT, prophylactic antibiotics, VTE prophylaxis, progressive ambulation and ADL's and discharge planning.The patient is planning to be discharged home with home health services.

## 2017-05-30 NOTE — Progress Notes (Addendum)
Anesthesia Chart Review:   Case:  [id number] Date/Time:  06/02/17 0715   Procedure:  RIGHT PATELLA-FEMORAL ARTHROPLASTY (Right )   Anesthesia type:  Choice   Pre-op diagnosis:  RIGHT KNEE PATELLA OSTEOARTHRITIS   Location:  [facility] OR ROOM 06 / [facility]   Surgeon:  Frederik Pear, MD      DISCUSSION:  - Pt is a 77 year old female with hx atrial fibrillation (has watchman device), sick sinus syndrome, pacemaker (Medtronic; last known device check 02/2017 prior to THA- report in media tab), AAA (3.5cm by CT 11/29/16). Previous hx roux-en-y gastric bypass.  - Hospitalized 11/12 - 11/30/16 for perforated viscus (s/p revision 11/22/16)  - Moved from [place] to [place] this past fall.   - It appears pt has not yet established with EP cardiology here in [place]. ?? device managed by [facility] providers? For previous surgery (THA 02/24/17), device rep was called to interrogate pacemaker prior to surgery.  Device rep has been notified about this upcoming surgery.    VS: BP 122/80   Pulse 78   Temp 36.5 C   Resp 20   Ht 4\' 11"  (1.499 m)   Wt 125 lb 3.2 oz (56.8 kg)   SpO2 100%   BMI 25.29 kg/m    PROVIDERS: PCP is Nuala Alpha, DO at the [facility] is Kirk Ruths, MD. Last office visit 12/14/16 - It appears pt has not yet established with EP cardiology here in [place].    LABS: Labs reviewed: Acceptable for surgery. (all labs ordered are listed, but only abnormal results are displayed)  Labs Reviewed  SURGICAL PCR SCREEN - Abnormal; Notable for the following components:      Result Value   Staphylococcus aureus POSITIVE (*)    All other components within normal limits  BASIC METABOLIC PANEL - Abnormal; Notable for the following components:   Glucose, Bld 124 (*)    All other components within normal limits  CBC WITH DIFFERENTIAL/PLATELET - Abnormal; Notable for the following components:   Hemoglobin 11.9 (*)    All other components within normal limits   URINALYSIS, ROUTINE W REFLEX MICROSCOPIC - Abnormal; Notable for the following components:   Hgb urine dipstick SMALL (*)    Leukocytes, UA MODERATE (*)    All other components within normal limits  APTT  PROTIME-INR  TYPE AND SCREEN     IMAGES:  CXR 02/24/17: Cardiomegaly.  No active disease.  CT abdomen/pelvis 11/29/16:  - Stable hiatal hernia. - Postoperative changes are seen involving the stomach, with surgical drain present entering right lower quadrant of the abdomen and passing under left hepatic lobe, with distal tip in left upper quadrant. No abnormal fluid collection is noted. - Moderate anasarca. - Stable intrahepatic and extrahepatic biliary dilatation is noted most consistent with post cholecystectomy status. - Stable 3.5 cm infrarenal abdominal aortic aneurysm. Recommend followup by ultrasound in 2 years.  - 1.3 cm enhancing abnormality is noted in left hepatic lobe which may represent hemangioma. Nonemergent evaluation with MRI when patient can follow breathing instructions and hold still for prolonged amount of time is recommended to rule out other pathology.   EKG 11/21/16: - Atrial fibrillation (113 bpm). RBBB. Inferior infarct, age indeterminate - EKG done in the ED in the setting of perforation of viscus.    CV:  Nuclear stress test 02/03/17:   The study is normal.  This is a low risk study.  The patient was in atrial fibrillation so gating was  not possible. Therefore, we were not able to obtain any information regarding left ventricular function.  Echo 11/30/16:  - Left ventricle: Diffuse hypokinesis worse in the mid and basalinferior wall. The cavity size was mildly dilated. Wall thicknesswas increased in a pattern of mild LVH. Systolic function wasmoderately to severely reduced. The estimated ejection fractionwas in the range of 30% to 35%. The study is not technicallysufficient to allow evaluation of LV diastolic function. - Mitral valve:  Calcified annulus. There was moderateregurgitation. - Left atrium: The atrium was moderately dilated. - Atrial septum: No defect or patent foramen ovale was identified.  Perioperative prescription form for pacemaker pending    Past Medical History:  Diagnosis Date  . AAA (abdominal aortic aneurysm) ([place])    3.5cm by CT 11/29/16  . Anemia   . Childhood asthma    connected to allergies  . Chronic lower back pain   . Diverticulosis   . Duodenal ulcer   . Dyspnea   . Dysrhythmia    a fib  . GERD (gastroesophageal reflux disease)   . GI bleed    "have had both upper and lower GIB"  . Gout   . Heart attack ([place]) 2017  . Heart murmur   . History of blood transfusion 2017-2018   "related to bleeding ulcers; 47 units PRBC; 2U plasma" (11/23/2016)  . History of hiatal hernia   . Hx of blood clots    in heart  . IBS (irritable bowel syndrome)   . Neuropathy   . Osteoarthritis   . Osteoporosis   . Peripheral neuropathy   . 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 Presence of Watchman left atrial appendage closure device   . Raynaud disease   . Rheumatoid arteritis   . SSS (sick sinus syndrome) ([place])    a. s/p Medtronic PPM placement in 2017  . Stomach ulcer     Past Surgical History:  Procedure Laterality Date  . ABDOMINAL HYSTERECTOMY    . APPENDECTOMY    . CARPAL TUNNEL RELEASE Bilateral   . CATARACT EXTRACTION W/ INTRAOCULAR LENS  IMPLANT, BILATERAL Bilateral   . CHOLECYSTECTOMY OPEN    . DILATION AND CURETTAGE OF UTERUS    . FOOT FUSION Right   . INGUINAL HERNIA REPAIR Right X 2  . INSERT / REPLACE / REMOVE PACEMAKER  2017   medtronic  . JOINT REPLACEMENT    . LAPAROTOMY N/A 11/21/2016   Procedure: EXPLORATORY LAPAROTOMY, REVISION OF  GASTROJEJUNOSTOMY, ESOPHAGOGASTROSCOPY;  Surgeon: Clovis Riley, MD;  Location: [facility];  Service: General;  Laterality: N/A;  . LEFT ATRIAL APPENDAGE OCCLUSION  2018   placed  via  groin  . LUMBAR FUSION  X 2  . REVERSE SHOULDER ARTHROPLASTY Left    reverse shoulder replacement  . ROUX-EN-Y GASTRIC BYPASS  2005  . ROUX-EN-Y PROCEDURE  20017 X 2   "revisions"  . TONSILLECTOMY AND ADENOIDECTOMY    . TOTAL HIP ARTHROPLASTY Right 02/24/2017   Procedure: TOTAL HIP ARTHROPLASTY;  Surgeon: Earlie Server, MD;  Location: [facility];  Service: Orthopedics;  Laterality: Right;  . TOTAL KNEE ARTHROPLASTY Bilateral   . TOTAL SHOULDER ARTHROPLASTY Bilateral   . TUBAL LIGATION      MEDICATIONS: . acetaminophen (TYLENOL) 500 MG tablet  . colchicine 0.6 MG tablet  . ferrous sulfate 325 (65 FE) MG tablet  . gabapentin (NEURONTIN) 300 MG capsule  . HYDROcodone-acetaminophen (NORCO/VICODIN) 5-325 MG tablet  . rOPINIRole (REQUIP) 0.5 MG  tablet  . sucralfate (CARAFATE) 1 g tablet  . TOVIAZ 4 MG TB24 tablet  . traMADol (ULTRAM) 50 MG tablet   No current facility-administered medications for this encounter.     If no changes, I anticipate pt can proceed with surgery as scheduled.   Willeen Cass, FNP-BC [facility]/Anesthesiology Phone: [phone] 05/30/2017 12:41 PM

## 2017-05-31 ENCOUNTER — Encounter (INDEPENDENT_AMBULATORY_CARE_PROVIDER_SITE_OTHER): Payer: Medicare Other | Admitting: Ophthalmology

## 2017-06-01 DIAGNOSIS — M25561 Pain in right knee: Secondary | ICD-10-CM | POA: Diagnosis not present

## 2017-06-01 DIAGNOSIS — M25551 Pain in right hip: Secondary | ICD-10-CM | POA: Diagnosis not present

## 2017-06-01 MED ORDER — TRANEXAMIC ACID 1000 MG/10ML IV SOLN
1000.0000 mg | INTRAVENOUS | Status: AC
  Administered 2017-06-02: 1000 mg via INTRAVENOUS
  Filled 2017-06-01: qty 1100

## 2017-06-01 MED ORDER — TRANEXAMIC ACID 1000 MG/10ML IV SOLN
2000.0000 mg | INTRAVENOUS | Status: DC
  Filled 2017-06-01: qty 20

## 2017-06-01 MED ORDER — BUPIVACAINE LIPOSOME 1.3 % IJ SUSP
20.0000 mL | INTRAMUSCULAR | Status: DC
  Filled 2017-06-01: qty 20

## 2017-06-01 NOTE — Anesthesia Preprocedure Evaluation (Addendum)
Anesthesia Evaluation  Patient identified by MRN, date of birth, ID band Patient awake    Reviewed: Allergy & Precautions, NPO status , Patient's Chart, lab work & pertinent test results  Airway Mallampati: II  TM Distance: <3 FB Neck ROM: Full    Dental  (+) Missing, Chipped,    Pulmonary neg pulmonary ROS,    Pulmonary exam normal breath sounds clear to auscultation       Cardiovascular hypertension, + Past MI and + Peripheral Vascular Disease (AAA)  + dysrhythmias Atrial Fibrillation + pacemaker (SSS) + Valvular Problems/Murmurs MR  Rhythm:Irregular Rate:Normal  '18 TTE - Diffuse hypokinesis worse in the mid and basal inferior wall. LV cavity size was mildly dilated. Mild LVH. EF 30% to 35%. Moderate MR with moderately dilated LA.   Hx SSS   Neuro/Psych  Neuromuscular disease negative psych ROS   GI/Hepatic Neg liver ROS, hiatal hernia, PUD, GERD  Medicated and Controlled,H/o gastric bypass, peptic ulcers, gi bleed, IBS   Endo/Other  negative endocrine ROS  Renal/GU negative Renal ROS     Musculoskeletal  (+) Arthritis , Rheumatoid disorders,    Abdominal   Peds  Hematology negative hematology ROS (+)   Anesthesia Other Findings   Reproductive/Obstetrics                            Anesthesia Physical  Anesthesia Plan  ASA: III  Anesthesia Plan: Spinal   Post-op Pain Management:  Regional for Post-op pain   Induction: Intravenous  PONV Risk Score and Plan: 2 and Ondansetron, Propofol infusion and Treatment may vary due to age or medical condition  Airway Management Planned: Simple Face Mask  Additional Equipment: None  Intra-op Plan:   Post-operative Plan:   Informed Consent: I have reviewed the patients History and Physical, chart, labs and discussed the procedure including the risks, benefits and alternatives for the proposed anesthesia with the patient or authorized  representative who has indicated his/her understanding and acceptance.     Plan Discussed with: CRNA and Anesthesiologist  Anesthesia Plan Comments:        Anesthesia Quick Evaluation

## 2017-06-02 ENCOUNTER — Inpatient Hospital Stay (HOSPITAL_COMMUNITY): Payer: Medicare Other | Admitting: Certified Registered Nurse Anesthetist

## 2017-06-02 ENCOUNTER — Other Ambulatory Visit: Payer: Self-pay

## 2017-06-02 ENCOUNTER — Other Ambulatory Visit: Payer: Self-pay | Admitting: Orthopedic Surgery

## 2017-06-02 ENCOUNTER — Inpatient Hospital Stay (HOSPITAL_COMMUNITY)
Admission: RE | Admit: 2017-06-02 | Discharge: 2017-06-04 | DRG: 467 | Disposition: A | Discharge status: Home Health | Payer: Medicare Other | Source: Ambulatory Visit | Attending: Orthopedic Surgery | Admitting: Orthopedic Surgery

## 2017-06-02 ENCOUNTER — Inpatient Hospital Stay (HOSPITAL_COMMUNITY): Payer: Medicare Other | Admitting: Emergency Medicine

## 2017-06-02 ENCOUNTER — Encounter (HOSPITAL_COMMUNITY): Payer: Self-pay | Admitting: *Deleted

## 2017-06-02 ENCOUNTER — Encounter (HOSPITAL_COMMUNITY)
Admission: RE | Disposition: A | Discharge status: Home Health | Payer: Self-pay | Source: Ambulatory Visit | Attending: Orthopedic Surgery

## 2017-06-02 DIAGNOSIS — Z8711 Personal history of peptic ulcer disease: Secondary | ICD-10-CM

## 2017-06-02 DIAGNOSIS — M1711 Unilateral primary osteoarthritis, right knee: Secondary | ICD-10-CM | POA: Diagnosis present

## 2017-06-02 DIAGNOSIS — T8484XA Pain due to internal orthopedic prosthetic devices, implants and grafts, initial encounter: Secondary | ICD-10-CM | POA: Diagnosis not present

## 2017-06-02 DIAGNOSIS — M810 Age-related osteoporosis without current pathological fracture: Secondary | ICD-10-CM | POA: Diagnosis present

## 2017-06-02 DIAGNOSIS — Z950 Presence of cardiac pacemaker: Secondary | ICD-10-CM | POA: Diagnosis not present

## 2017-06-02 DIAGNOSIS — G8929 Other chronic pain: Secondary | ICD-10-CM | POA: Diagnosis present

## 2017-06-02 DIAGNOSIS — Z9841 Cataract extraction status, right eye: Secondary | ICD-10-CM

## 2017-06-02 DIAGNOSIS — K219 Gastro-esophageal reflux disease without esophagitis: Secondary | ICD-10-CM | POA: Diagnosis present

## 2017-06-02 DIAGNOSIS — M545 Low back pain: Secondary | ICD-10-CM | POA: Diagnosis present

## 2017-06-02 DIAGNOSIS — Z981 Arthrodesis status: Secondary | ICD-10-CM | POA: Diagnosis not present

## 2017-06-02 DIAGNOSIS — Z961 Presence of intraocular lens: Secondary | ICD-10-CM | POA: Diagnosis present

## 2017-06-02 DIAGNOSIS — K589 Irritable bowel syndrome without diarrhea: Secondary | ICD-10-CM | POA: Diagnosis present

## 2017-06-02 DIAGNOSIS — Z96612 Presence of left artificial shoulder joint: Secondary | ICD-10-CM | POA: Diagnosis present

## 2017-06-02 DIAGNOSIS — I252 Old myocardial infarction: Secondary | ICD-10-CM | POA: Diagnosis not present

## 2017-06-02 DIAGNOSIS — M109 Gout, unspecified: Secondary | ICD-10-CM | POA: Diagnosis present

## 2017-06-02 DIAGNOSIS — Z79899 Other long term (current) drug therapy: Secondary | ICD-10-CM

## 2017-06-02 DIAGNOSIS — Z9049 Acquired absence of other specified parts of digestive tract: Secondary | ICD-10-CM | POA: Diagnosis not present

## 2017-06-02 DIAGNOSIS — Z9842 Cataract extraction status, left eye: Secondary | ICD-10-CM | POA: Diagnosis not present

## 2017-06-02 DIAGNOSIS — Z96651 Presence of right artificial knee joint: Secondary | ICD-10-CM | POA: Diagnosis not present

## 2017-06-02 DIAGNOSIS — Z885 Allergy status to narcotic agent status: Secondary | ICD-10-CM

## 2017-06-02 DIAGNOSIS — Z96653 Presence of artificial knee joint, bilateral: Secondary | ICD-10-CM | POA: Diagnosis present

## 2017-06-02 DIAGNOSIS — I5022 Chronic systolic (congestive) heart failure: Secondary | ICD-10-CM | POA: Diagnosis present

## 2017-06-02 DIAGNOSIS — Z90710 Acquired absence of both cervix and uterus: Secondary | ICD-10-CM

## 2017-06-02 DIAGNOSIS — M25761 Osteophyte, right knee: Secondary | ICD-10-CM | POA: Diagnosis present

## 2017-06-02 DIAGNOSIS — Z9884 Bariatric surgery status: Secondary | ICD-10-CM

## 2017-06-02 DIAGNOSIS — Z96641 Presence of right artificial hip joint: Secondary | ICD-10-CM | POA: Diagnosis present

## 2017-06-02 DIAGNOSIS — I110 Hypertensive heart disease with heart failure: Secondary | ICD-10-CM | POA: Diagnosis present

## 2017-06-02 DIAGNOSIS — Z96611 Presence of right artificial shoulder joint: Secondary | ICD-10-CM | POA: Diagnosis present

## 2017-06-02 DIAGNOSIS — I739 Peripheral vascular disease, unspecified: Secondary | ICD-10-CM | POA: Diagnosis present

## 2017-06-02 DIAGNOSIS — I714 Abdominal aortic aneurysm, without rupture: Secondary | ICD-10-CM | POA: Diagnosis present

## 2017-06-02 DIAGNOSIS — T84092A Other mechanical complication of internal right knee prosthesis, initial encounter: Secondary | ICD-10-CM | POA: Diagnosis not present

## 2017-06-02 DIAGNOSIS — Z79891 Long term (current) use of opiate analgesic: Secondary | ICD-10-CM

## 2017-06-02 DIAGNOSIS — Z8249 Family history of ischemic heart disease and other diseases of the circulatory system: Secondary | ICD-10-CM

## 2017-06-02 DIAGNOSIS — G629 Polyneuropathy, unspecified: Secondary | ICD-10-CM | POA: Diagnosis present

## 2017-06-02 DIAGNOSIS — G8918 Other acute postprocedural pain: Secondary | ICD-10-CM | POA: Diagnosis not present

## 2017-06-02 DIAGNOSIS — I4891 Unspecified atrial fibrillation: Secondary | ICD-10-CM | POA: Diagnosis present

## 2017-06-02 HISTORY — PX: ARTHROPLASTY: SHX135

## 2017-06-02 HISTORY — DX: Unilateral primary osteoarthritis, right knee: M17.11

## 2017-06-02 HISTORY — PX: PATELLA-FEMORAL ARTHROPLASTY: SHX5037

## 2017-06-02 SURGERY — ARTHROPLASTY, PATELLOFEMORAL
Anesthesia: Spinal | Laterality: Right

## 2017-06-02 MED ORDER — ZOLPIDEM TARTRATE 5 MG PO TABS: 5.0000 mg | ORAL_TABLET | Freq: Every evening | ORAL | Status: DC | PRN

## 2017-06-02 MED ORDER — FENTANYL CITRATE (PF) 100 MCG/2ML IJ SOLN
INTRAMUSCULAR | Status: DC | PRN
  Administered 2017-06-02 (×2): 50 ug via INTRAVENOUS

## 2017-06-02 MED ORDER — DOCUSATE SODIUM 100 MG PO CAPS
100.0000 mg | ORAL_CAPSULE | Freq: Two times a day (BID) | ORAL | Status: DC
  Administered 2017-06-02 – 2017-06-04 (×4): 100 mg via ORAL
  Filled 2017-06-02 (×4): qty 1

## 2017-06-02 MED ORDER — SUCCINYLCHOLINE CHLORIDE 200 MG/10ML IV SOSY
PREFILLED_SYRINGE | INTRAVENOUS | Status: AC
  Filled 2017-06-02: qty 10

## 2017-06-02 MED ORDER — PHENOL 1.4 % MT LIQD: 1.0000 | OROMUCOSAL | Status: DC | PRN

## 2017-06-02 MED ORDER — LACTATED RINGERS IV SOLN
INTRAVENOUS | Status: DC
  Administered 2017-06-02: 07:00:00 via INTRAVENOUS

## 2017-06-02 MED ORDER — SODIUM CHLORIDE 0.9 % IR SOLN
Status: DC | PRN
  Administered 2017-06-02: 3000 mL

## 2017-06-02 MED ORDER — KCL IN DEXTROSE-NACL 20-5-0.45 MEQ/L-%-% IV SOLN
INTRAVENOUS | Status: DC
  Administered 2017-06-02: 11:00:00 via INTRAVENOUS
  Filled 2017-06-02: qty 1000

## 2017-06-02 MED ORDER — ONDANSETRON HCL 4 MG/2ML IJ SOLN
INTRAMUSCULAR | Status: DC | PRN
  Administered 2017-06-02: 4 mg via INTRAVENOUS

## 2017-06-02 MED ORDER — ROPINIROLE HCL 1 MG PO TABS
1.0000 mg | ORAL_TABLET | Freq: Every day | ORAL | Status: DC
  Administered 2017-06-02 – 2017-06-03 (×2): 1 mg via ORAL
  Filled 2017-06-02 (×2): qty 1

## 2017-06-02 MED ORDER — FESOTERODINE FUMARATE ER 4 MG PO TB24
4.0000 mg | ORAL_TABLET | Freq: Every day | ORAL | Status: DC
  Administered 2017-06-02 – 2017-06-04 (×3): 4 mg via ORAL
  Filled 2017-06-02 (×3): qty 1

## 2017-06-02 MED ORDER — TIZANIDINE HCL 2 MG PO TABS: 2.0000 mg | ORAL_TABLET | Freq: Four times a day (QID) | ORAL | 0 refills | Status: DC | PRN

## 2017-06-02 MED ORDER — GABAPENTIN 300 MG PO CAPS
300.0000 mg | ORAL_CAPSULE | Freq: Three times a day (TID) | ORAL | Status: DC
  Administered 2017-06-02 – 2017-06-04 (×6): 300 mg via ORAL
  Filled 2017-06-02 (×6): qty 1

## 2017-06-02 MED ORDER — DIPHENHYDRAMINE HCL 12.5 MG/5ML PO ELIX: 12.5000 mg | ORAL_SOLUTION | ORAL | Status: DC | PRN

## 2017-06-02 MED ORDER — PROPOFOL 500 MG/50ML IV EMUL
INTRAVENOUS | Status: DC | PRN
  Administered 2017-06-02: 50 ug/kg/min via INTRAVENOUS

## 2017-06-02 MED ORDER — BUPIVACAINE-EPINEPHRINE (PF) 0.5% -1:200000 IJ SOLN
INTRAMUSCULAR | Status: DC | PRN
  Administered 2017-06-02: 20 mL via PERINEURAL

## 2017-06-02 MED ORDER — HYDROMORPHONE HCL 2 MG/ML IJ SOLN
0.5000 mg | INTRAMUSCULAR | Status: DC | PRN
  Administered 2017-06-02 – 2017-06-03 (×3): 0.5 mg via INTRAVENOUS
  Filled 2017-06-02 (×4): qty 1

## 2017-06-02 MED ORDER — METHOCARBAMOL 500 MG PO TABS
500.0000 mg | ORAL_TABLET | Freq: Four times a day (QID) | ORAL | Status: DC | PRN
  Administered 2017-06-02 – 2017-06-04 (×3): 500 mg via ORAL
  Filled 2017-06-02 (×3): qty 1

## 2017-06-02 MED ORDER — FENTANYL CITRATE (PF) 100 MCG/2ML IJ SOLN: 25.0000 ug | INTRAMUSCULAR | Status: DC | PRN

## 2017-06-02 MED ORDER — MIDAZOLAM HCL 5 MG/5ML IJ SOLN
INTRAMUSCULAR | Status: DC | PRN
  Administered 2017-06-02: 2 mg via INTRAVENOUS

## 2017-06-02 MED ORDER — METOCLOPRAMIDE HCL 5 MG PO TABS
5.0000 mg | ORAL_TABLET | Freq: Three times a day (TID) | ORAL | Status: DC | PRN
Start: 2017-06-02 — End: 2017-06-04

## 2017-06-02 MED ORDER — TRANEXAMIC ACID 1000 MG/10ML IV SOLN
1000.0000 mg | Freq: Once | INTRAVENOUS | Status: AC
  Administered 2017-06-02: 1000 mg via INTRAVENOUS
  Filled 2017-06-02: qty 10

## 2017-06-02 MED ORDER — PHENYLEPHRINE 40 MCG/ML (10ML) SYRINGE FOR IV PUSH (FOR BLOOD PRESSURE SUPPORT)
PREFILLED_SYRINGE | INTRAVENOUS | Status: DC | PRN
  Administered 2017-06-02 (×7): 80 ug via INTRAVENOUS

## 2017-06-02 MED ORDER — APIXABAN 2.5 MG PO TABS: 2.5000 mg | ORAL_TABLET | Freq: Two times a day (BID) | ORAL | 0 refills | Status: DC

## 2017-06-02 MED ORDER — BISACODYL 5 MG PO TBEC
5.0000 mg | DELAYED_RELEASE_TABLET | Freq: Every day | ORAL | Status: DC | PRN
  Administered 2017-06-04: 5 mg via ORAL
  Filled 2017-06-02: qty 1

## 2017-06-02 MED ORDER — ONDANSETRON HCL 4 MG/2ML IJ SOLN
INTRAMUSCULAR | Status: AC
  Filled 2017-06-02: qty 2

## 2017-06-02 MED ORDER — ACETAMINOPHEN 325 MG PO TABS: 325.0000 mg | ORAL_TABLET | Freq: Four times a day (QID) | ORAL | Status: DC | PRN

## 2017-06-02 MED ORDER — BUPIVACAINE LIPOSOME 1.3 % IJ SUSP
INTRAMUSCULAR | Status: DC | PRN
  Administered 2017-06-02: 20 mL

## 2017-06-02 MED ORDER — ROPINIROLE HCL 0.5 MG PO TABS
0.5000 mg | ORAL_TABLET | Freq: Every day | ORAL | Status: DC
  Administered 2017-06-02 – 2017-06-04 (×3): 0.5 mg via ORAL
  Filled 2017-06-02 (×3): qty 1

## 2017-06-02 MED ORDER — BUPIVACAINE-EPINEPHRINE 0.25% -1:200000 IJ SOLN
INTRAMUSCULAR | Status: AC
  Filled 2017-06-02: qty 1

## 2017-06-02 MED ORDER — PANTOPRAZOLE SODIUM 40 MG PO TBEC
40.0000 mg | DELAYED_RELEASE_TABLET | Freq: Every day | ORAL | Status: DC
  Administered 2017-06-02 – 2017-06-04 (×3): 40 mg via ORAL
  Filled 2017-06-02 (×3): qty 1

## 2017-06-02 MED ORDER — BUPIVACAINE-EPINEPHRINE (PF) 0.25% -1:200000 IJ SOLN
INTRAMUSCULAR | Status: DC | PRN
  Administered 2017-06-02: 50 mL via PERINEURAL

## 2017-06-02 MED ORDER — OXYCODONE HCL 5 MG PO TABS: 5.0000 mg | ORAL_TABLET | Freq: Once | ORAL | Status: DC | PRN

## 2017-06-02 MED ORDER — COLCHICINE 0.6 MG PO TABS: 0.6000 mg | ORAL_TABLET | Freq: Two times a day (BID) | ORAL | Status: DC | PRN

## 2017-06-02 MED ORDER — TRAMADOL HCL 50 MG PO TABS
50.0000 mg | ORAL_TABLET | Freq: Four times a day (QID) | ORAL | Status: DC
  Administered 2017-06-02 – 2017-06-04 (×8): 50 mg via ORAL
  Filled 2017-06-02 (×8): qty 1

## 2017-06-02 MED ORDER — METHOCARBAMOL 1000 MG/10ML IJ SOLN
500.0000 mg | Freq: Four times a day (QID) | INTRAVENOUS | Status: DC | PRN
  Filled 2017-06-02: qty 5

## 2017-06-02 MED ORDER — ROPINIROLE HCL 0.5 MG PO TABS: 0.5000 mg | ORAL_TABLET | Freq: Two times a day (BID) | ORAL | Status: DC

## 2017-06-02 MED ORDER — TRANEXAMIC ACID 1000 MG/10ML IV SOLN
INTRAVENOUS | Status: DC | PRN
  Administered 2017-06-02: 2000 mg via TOPICAL

## 2017-06-02 MED ORDER — CHLORHEXIDINE GLUCONATE 4 % EX LIQD: 60.0000 mL | Freq: Once | CUTANEOUS | Status: DC

## 2017-06-02 MED ORDER — METOCLOPRAMIDE HCL 5 MG/ML IJ SOLN: 5.0000 mg | Freq: Three times a day (TID) | INTRAMUSCULAR | Status: DC | PRN

## 2017-06-02 MED ORDER — PROPOFOL 10 MG/ML IV BOLUS
INTRAVENOUS | Status: AC
  Filled 2017-06-02: qty 40

## 2017-06-02 MED ORDER — LIDOCAINE 2% (20 MG/ML) 5 ML SYRINGE
INTRAMUSCULAR | Status: AC
  Filled 2017-06-02: qty 5

## 2017-06-02 MED ORDER — SODIUM CHLORIDE 0.9 % IJ SOLN
INTRAMUSCULAR | Status: DC | PRN
  Administered 2017-06-02: 50 mL

## 2017-06-02 MED ORDER — CEFAZOLIN SODIUM-DEXTROSE 2-4 GM/100ML-% IV SOLN
2.0000 g | INTRAVENOUS | Status: AC
  Administered 2017-06-02: 2 g via INTRAVENOUS
  Filled 2017-06-02: qty 100

## 2017-06-02 MED ORDER — OXYCODONE HCL 5 MG PO TABS
5.0000 mg | ORAL_TABLET | ORAL | Status: DC | PRN
  Administered 2017-06-02: 10 mg via ORAL
  Administered 2017-06-02: 5 mg via ORAL
  Administered 2017-06-03 – 2017-06-04 (×6): 10 mg via ORAL
  Filled 2017-06-02 (×2): qty 2
  Filled 2017-06-02: qty 1
  Filled 2017-06-02 (×5): qty 2

## 2017-06-02 MED ORDER — MIDAZOLAM HCL 2 MG/2ML IJ SOLN
INTRAMUSCULAR | Status: AC
  Filled 2017-06-02: qty 2

## 2017-06-02 MED ORDER — SUGAMMADEX SODIUM 200 MG/2ML IV SOLN
INTRAVENOUS | Status: AC
  Filled 2017-06-02: qty 2

## 2017-06-02 MED ORDER — ONDANSETRON HCL 4 MG PO TABS: 4.0000 mg | ORAL_TABLET | Freq: Four times a day (QID) | ORAL | Status: DC | PRN

## 2017-06-02 MED ORDER — PHENYLEPHRINE 40 MCG/ML (10ML) SYRINGE FOR IV PUSH (FOR BLOOD PRESSURE SUPPORT)
PREFILLED_SYRINGE | INTRAVENOUS | Status: AC
  Filled 2017-06-02: qty 10

## 2017-06-02 MED ORDER — ONDANSETRON HCL 4 MG/2ML IJ SOLN: 4.0000 mg | Freq: Four times a day (QID) | INTRAMUSCULAR | Status: DC | PRN

## 2017-06-02 MED ORDER — DEXAMETHASONE SODIUM PHOSPHATE 10 MG/ML IJ SOLN
INTRAMUSCULAR | Status: AC
  Filled 2017-06-02: qty 1

## 2017-06-02 MED ORDER — ROCURONIUM BROMIDE 10 MG/ML (PF) SYRINGE
PREFILLED_SYRINGE | INTRAVENOUS | Status: AC
  Filled 2017-06-02: qty 5

## 2017-06-02 MED ORDER — ONDANSETRON HCL 4 MG/2ML IJ SOLN: 4.0000 mg | Freq: Once | INTRAMUSCULAR | Status: DC | PRN

## 2017-06-02 MED ORDER — APIXABAN 2.5 MG PO TABS
2.5000 mg | ORAL_TABLET | Freq: Two times a day (BID) | ORAL | Status: DC
Start: 2017-06-03 — End: 2017-06-04
  Administered 2017-06-03 – 2017-06-04 (×3): 2.5 mg via ORAL
  Filled 2017-06-02 (×3): qty 1

## 2017-06-02 MED ORDER — OXYCODONE HCL 5 MG/5ML PO SOLN: 5.0000 mg | Freq: Once | ORAL | Status: DC | PRN

## 2017-06-02 MED ORDER — OXYCODONE-ACETAMINOPHEN 5-325 MG PO TABS: 1.0000 | ORAL_TABLET | ORAL | 0 refills | Status: DC | PRN

## 2017-06-02 MED ORDER — POLYETHYLENE GLYCOL 3350 17 G PO PACK: 17.0000 g | PACK | Freq: Every day | ORAL | Status: DC | PRN

## 2017-06-02 MED ORDER — BUPIVACAINE IN DEXTROSE 0.75-8.25 % IT SOLN
INTRATHECAL | Status: DC | PRN
  Administered 2017-06-02: 1.4 mL via INTRATHECAL

## 2017-06-02 MED ORDER — SUCRALFATE 1 G PO TABS
1.0000 g | ORAL_TABLET | Freq: Three times a day (TID) | ORAL | Status: DC
  Administered 2017-06-02 – 2017-06-04 (×9): 1 g via ORAL
  Filled 2017-06-02 (×9): qty 1

## 2017-06-02 MED ORDER — MENTHOL 3 MG MT LOZG: 1.0000 | LOZENGE | OROMUCOSAL | Status: DC | PRN

## 2017-06-02 MED ORDER — FENTANYL CITRATE (PF) 250 MCG/5ML IJ SOLN
INTRAMUSCULAR | Status: AC
  Filled 2017-06-02: qty 5

## 2017-06-02 MED ORDER — FLEET ENEMA 7-19 GM/118ML RE ENEM: 1.0000 | ENEMA | Freq: Once | RECTAL | Status: DC | PRN

## 2017-06-02 MED ORDER — FERROUS SULFATE 325 (65 FE) MG PO TABS
325.0000 mg | ORAL_TABLET | Freq: Every day | ORAL | Status: DC
  Administered 2017-06-03 – 2017-06-04 (×2): 325 mg via ORAL
  Filled 2017-06-02 (×2): qty 1

## 2017-06-02 MED ORDER — ALUM & MAG HYDROXIDE-SIMETH 200-200-20 MG/5ML PO SUSP: 30.0000 mL | ORAL | Status: DC | PRN

## 2017-06-02 SURGICAL SUPPLY — 53 items
BANDAGE ESMARK 6X9 LF (GAUZE/BANDAGES/DRESSINGS) ×1 IMPLANT
BLADE SAG 18X100X1.27 (BLADE) ×3 IMPLANT
BLADE SAGITTAL 13X1.27X60 (BLADE) IMPLANT
BLADE SAGITTAL 13X1.27X60MM (BLADE)
BLADE SAW SGTL 13X75X1.27 (BLADE) IMPLANT
BLADE SURG 10 STRL SS (BLADE) ×9 IMPLANT
BNDG ELASTIC 6X10 VLCR STRL LF (GAUZE/BANDAGES/DRESSINGS) ×3 IMPLANT
BNDG ESMARK 6X9 LF (GAUZE/BANDAGES/DRESSINGS) ×3
BOWL SMART MIX CTS (DISPOSABLE) ×3 IMPLANT
CEMENT HV SMART SET (Cement) ×3 IMPLANT
COVER SURGICAL LIGHT HANDLE (MISCELLANEOUS) ×3 IMPLANT
CUFF TOURNIQUET SINGLE 34IN LL (TOURNIQUET CUFF) ×3 IMPLANT
DRAPE EXTREMITY T 121X128X90 (DRAPE) ×3 IMPLANT
DRAPE U-SHAPE 47X51 STRL (DRAPES) ×3 IMPLANT
DRSG AQUACEL AG ADV 3.5X10 (GAUZE/BANDAGES/DRESSINGS) ×3 IMPLANT
DURAPREP 26ML APPLICATOR (WOUND CARE) ×3 IMPLANT
ELECT REM PT RETURN 9FT ADLT (ELECTROSURGICAL) ×3
ELECTRODE REM PT RTRN 9FT ADLT (ELECTROSURGICAL) ×1 IMPLANT
GLOVE BIO SURGEON STRL SZ7.5 (GLOVE) ×3 IMPLANT
GLOVE BIO SURGEON STRL SZ8.5 (GLOVE) ×6 IMPLANT
GLOVE BIOGEL PI IND STRL 8 (GLOVE) ×2 IMPLANT
GLOVE BIOGEL PI IND STRL 9 (GLOVE) ×1 IMPLANT
GLOVE BIOGEL PI INDICATOR 8 (GLOVE) ×4
GLOVE BIOGEL PI INDICATOR 9 (GLOVE) ×2
GOWN STRL REUS W/ TWL LRG LVL3 (GOWN DISPOSABLE) ×1 IMPLANT
GOWN STRL REUS W/ TWL XL LVL3 (GOWN DISPOSABLE) ×2 IMPLANT
GOWN STRL REUS W/TWL LRG LVL3 (GOWN DISPOSABLE) ×2
GOWN STRL REUS W/TWL XL LVL3 (GOWN DISPOSABLE) ×4
HANDPIECE INTERPULSE COAX TIP (DISPOSABLE) ×2
HOOD PEEL AWAY FACE SHEILD DIS (HOOD) ×9 IMPLANT
HOOD PEEL AWAY FLYTE STAYCOOL (MISCELLANEOUS) ×3 IMPLANT
INSERT TIB TRIATH 16X3 SZ3 (Insert) ×3 IMPLANT
KIT BASIN OR (CUSTOM PROCEDURE TRAY) ×3 IMPLANT
KIT TURNOVER KIT B (KITS) ×3 IMPLANT
MANIFOLD NEPTUNE II (INSTRUMENTS) ×3 IMPLANT
NEEDLE 22X1 1/2 (OR ONLY) (NEEDLE) ×6 IMPLANT
NS IRRIG 1000ML POUR BTL (IV SOLUTION) ×3 IMPLANT
PACK TOTAL JOINT (CUSTOM PROCEDURE TRAY) ×3 IMPLANT
PAD ARMBOARD 7.5X6 YLW CONV (MISCELLANEOUS) ×6 IMPLANT
PATELLA TRIATHLON SZ 29 9 MM (Orthopedic Implant) ×3 IMPLANT
SET HNDPC FAN SPRY TIP SCT (DISPOSABLE) ×1 IMPLANT
SUT VIC AB 0 CT1 27 (SUTURE) ×2
SUT VIC AB 0 CT1 27XBRD ANBCTR (SUTURE) ×1 IMPLANT
SUT VIC AB 1 CTX 36 (SUTURE) ×2
SUT VIC AB 1 CTX36XBRD ANBCTR (SUTURE) ×1 IMPLANT
SUT VIC AB 2-0 CT1 27 (SUTURE) ×2
SUT VIC AB 2-0 CT1 TAPERPNT 27 (SUTURE) ×1 IMPLANT
SUT VIC AB 3-0 CT1 27 (SUTURE) ×2
SUT VIC AB 3-0 CT1 TAPERPNT 27 (SUTURE) ×1 IMPLANT
SYR CONTROL 10ML LL (SYRINGE) ×6 IMPLANT
TOWEL OR 17X24 6PK STRL BLUE (TOWEL DISPOSABLE) ×3 IMPLANT
TOWEL OR 17X26 10 PK STRL BLUE (TOWEL DISPOSABLE) ×3 IMPLANT
TRAY CATH 16FR W/PLASTIC CATH (SET/KITS/TRAYS/PACK) IMPLANT

## 2017-06-02 NOTE — Op Note (Signed)
PATIENT ID:      Deanna Spencer  MRN:     [id number] DOB/AGE:    [birth year] / 77 y.o.       OPERATIVE REPORT    DATE OF PROCEDURE:  06/02/2017       PREOPERATIVE DIAGNOSIS:   RIGHT KNEE PATELLA OSTEOARTHRITIS      Estimated body mass index is 25.25 kg/m as calculated from the following:   Height as of this encounter: 4\' 11"  (1.499 m).   Weight as of this encounter: 125 lb (56.7 kg).                                                        POSTOPERATIVE DIAGNOSIS:   RIGHT KNEE PATELLA OSTEOARTHRITIS                                                                      PROCEDURE:  Procedure(s): RIGHT PATELLA-FEMORAL ARTHROPLASTY, with placement of a cemented 29 mm Stryker asymmetric triathlon patellar button, revision of the tibial polyethylene bearing.  SURGEON: Kerin Salen    ASSISTANT:   Kerry Hough. [employer]   (Present and scrubbed throughout the case, critical for assistance with exposure, retraction, instrumentation, and closure.)         ANESTHESIA: Spinal, 20cc Exparel, 50cc 0.25% Marcaine  EBL: 150 cc  FLUID REPLACEMENT: 1 L crystalloid  TOURNIQUET TIME: None  Drains: None  Tranexamic Acid: 1gm IV, 2gm topical  COMPLICATIONS:  None         INDICATIONS FOR PROCEDURE: The patient has a pre-existing cemented Stryker total knee arthroplasty that did not include resurfacing of the patella procedure was done about 5 years ago.  She has developed anterior knee pain radiographs show erosion of the patella patient got good relief with anesthetic and cortisone injection that was temporary.  She desires elective resurfacing of the patella which will probably require revision of the tibial polyethylene bearing to gain access and evert the patella.  Risks and benefits of surgery been discussed and all questions answered.  DESCRIPTION OF PROCEDURE: The patient identified by armband, received  IV antibiotics, in the holding area at [facility]. Patient taken to the operating room,  appropriate anesthetic  monitors were attached, and spinal anesthesia was  induced. Tourniquet  applied high to the operative thigh. Lateral post and foot positioner  applied to the table, the lower extremity was then prepped and draped  in usual sterile fashion from the toes to the tourniquet. Time-out procedure was performed. We began the operation, with the knee flexed 120 degrees, by making the anterior midline incision starting at handbreadth above the patella going over the patella 1 cm medial to and 4 cm distal to the tibial tubercle. Small bleeders in the skin and the  subcutaneous tissue identified and cauterized. Transverse retinaculum was incised and reflected medially and a medial parapatellar arthrotomy was accomplished.  We are able to laterally subluxed the patella but not everted because of the scar tissue from her index arthroplasty.  At this point we elected to remove the tibial  baseplate bearing which was accomplished with 1/4 inch osteotome.  This did allow [place] to evert the patella and remove a large lateral osteophyte further accomplishing and effective lateral release.  We then remove the scar tissue from around the patella brought up the Stryker patellar clamp and removed the posterior 9 to 10 mm of the patella with a straight cut.  We measured for a 29 mm asymmetric button.  The template was placed and drilled without difficulty.  At this point we irrigated out the knee with normal saline solution and also irrigated with trans-Amick acid solution.  A new polyethylene tibial bearing was then inserted and snapped into place we placed the trial patellar button and noticed excellent tracking of the patella with no lateral tilt.  The trial button was then removed the bony surface irrigated with normal saline solution pulse lavage Exparel was placed on the cancellus bone and the patella again dried with a sponge.  A single batch of bone cement was then mixed applied to the patellar button and a  bony cancellus bone the button was then clamped into place and excess cement removed.  The wound was then pulse lavaged clean one more time.  We injected Exparel into the soft tissues unclamped the patella and closed the wound with running #1 Vicryl suture. The subcutaneous tissue with 0 and 2-0 undyed Vicryl suture, and the skin with running 3-0 SQ vicryl. A dressing of Xeroform,  4 x 4, dressing sponges, Webril, and Ace wrap applied. The patient  awakened, and taken to recovery room without difficulty.   Kerin Salen 06/02/2017, 8:42 AM

## 2017-06-02 NOTE — Progress Notes (Signed)
Pt and family at bedside instructed on benefits of bone foam and pt refused and requested to have it removed upon placement. Pt verbalized understanding how a pillow will not be used upon her request, but pt repositioned for comfort.

## 2017-06-02 NOTE — Transfer of Care (Signed)
Immediate Anesthesia Transfer of Care Note  Patient: Deanna Spencer  Procedure(s) Performed: RIGHT PATELLA-FEMORAL ARTHROPLASTY (Right )  Patient Location: PACU  Anesthesia Type:MAC  Level of Consciousness: awake, alert , oriented and patient cooperative  Airway & Oxygen Therapy: Patient Spontanous Breathing and Patient connected to nasal cannula oxygen  Post-op Assessment: Report given to RN and Post -op Vital signs reviewed and stable  Post vital signs: Reviewed and stable  Last Vitals:  Vitals Value Taken Time  BP 109/67 06/02/2017  9:16 AM  Temp    Pulse 93 06/02/2017  9:16 AM  Resp 19 06/02/2017  9:16 AM  SpO2 85 % 06/02/2017  9:16 AM  Vitals shown include unvalidated device data.  Last Pain:  Vitals:   06/02/17 0647  PainSc: 0-No pain         Complications: No apparent anesthesia complications

## 2017-06-02 NOTE — Interval H&P Note (Signed)
History and Physical Interval Note:  06/02/2017 7:08 AM  Deanna Spencer  has presented today for surgery, with the diagnosis of RIGHT KNEE PATELLA OSTEOARTHRITIS  The various methods of treatment have been discussed with the patient and family. After consideration of risks, benefits and other options for treatment, the patient has consented to  Procedure(s): RIGHT PATELLA-FEMORAL ARTHROPLASTY (Right) as a surgical intervention .  The patient's history has been reviewed, patient examined, no change in status, stable for surgery.  I have reviewed the patient's chart and labs.  Questions were answered to the patient's satisfaction.     Kerin Salen

## 2017-06-02 NOTE — Social Work (Signed)
CSW acknowledging SNF consult, await PT/OT evaluations.  CSW will continue to follow.  Alexander Mt, [place] Work [phone]

## 2017-06-02 NOTE — Anesthesia Procedure Notes (Signed)
Anesthesia Regional Block: Adductor canal block   Pre-Anesthetic Checklist: ,, timeout performed, Correct Patient, Correct Site, Correct Laterality, Correct Procedure, Correct Position, site marked, Risks and benefits discussed,  Surgical consent,  Pre-op evaluation,  At surgeon's request and post-op pain management  Laterality: Right  Prep: chloraprep       Needles:  Injection technique: Single-shot  Needle Type: Echogenic Needle     Needle Length: 9cm  Needle Gauge: 21     Additional Needles:   Narrative:  Start time: 06/02/2017 7:04 AM End time: 06/02/2017 7:08 AM Injection made incrementally with aspirations every 5 mL.  Performed by: Personally  Anesthesiologist: Audry Pili, MD  Additional Notes: No pain on injection. No increased resistance to injection. Injection made in 5cc increments. Good needle visualization. Patient tolerated the procedure well.

## 2017-06-02 NOTE — Anesthesia Postprocedure Evaluation (Signed)
Anesthesia Post Note  Patient: Deanna Spencer  Procedure(s) Performed: RIGHT PATELLA-FEMORAL ARTHROPLASTY (Right )     Patient location during evaluation: PACU Anesthesia Type: Spinal Level of consciousness: awake and alert Pain management: pain level controlled Vital Signs Assessment: post-procedure vital signs reviewed and stable Respiratory status: spontaneous breathing and respiratory function stable Cardiovascular status: blood pressure returned to baseline and stable Postop Assessment: spinal receding and no apparent nausea or vomiting Anesthetic complications: no    Last Vitals:  Vitals:   06/02/17 1030 06/02/17 1047  BP: 134/84 (!) 108/53  Pulse: 84 77  Resp: 15 15  Temp: (!) 36.2 C (!) 36.4 C  SpO2: 98% 100%    Last Pain:  Vitals:   06/02/17 1047  TempSrc: Axillary  PainSc:                  Audry Pili

## 2017-06-02 NOTE — Anesthesia Procedure Notes (Signed)
Spinal  Patient location during procedure: OR Start time: 06/02/2017 7:36 AM End time: 06/02/2017 7:44 AM Staffing Anesthesiologist: Audry Pili, MD Performed: anesthesiologist  Preanesthetic Checklist Completed: patient identified, surgical consent, pre-op evaluation, timeout performed, IV checked, risks and benefits discussed and monitors and equipment checked Spinal Block Patient position: sitting Prep: DuraPrep Patient monitoring: heart rate, cardiac monitor, continuous pulse ox and blood pressure Approach: midline Location: L2-3 Injection technique: single-shot Needle Needle type: Quincke  Needle gauge: 22 G Additional Notes Functioning IV was confirmed and monitors were applied. Sterile prep and drape, including hand hygiene, mask, and sterile gloves were used. The patient was positioned and the spine was prepped. The skin was anesthetized with lidocaine. Free flow of clear CSF was obtained prior to injecting local anesthetic into the CSF. The spinal needle aspirated freely following injection. The needle was carefully withdrawn. The patient tolerated the procedure well. Consent was obtained prior to the procedure with all questions answered and concerns addressed. Risks including, but not limited to, bleeding, infection, nerve damage, paralysis, failed block, inadequate analgesia, allergic reaction, high spinal, itching, and headache were discussed and the patient wished to proceed.  Renold Don, MD

## 2017-06-02 NOTE — Care Plan (Signed)
Patient lives with her daughter, Deanna Spencer. She is planning to discharge to home with her and HHPT from [facility]. She has all needed equipment at home. She is scheduled to see Dr. Mayer Camel in the office on 06/15/17 @ 930 and will transition to OPPT after.  I have spoke with Daughter, Deanna Spencer, today and confirmed this plan.  No other needs  Please contact Ladell Heads, [place] with questions or if this plan should need to change.   Thanks

## 2017-06-02 NOTE — Progress Notes (Signed)
Orthopedic Tech Progress Note Patient Details:  Deanna Spencer 11/02/1940 [id number]  Ortho Devices Type of Ortho Device: Bone foam zero knee   Post Interventions Instructions Provided: Care of device   [name] 06/02/2017, 3:37 PM

## 2017-06-02 NOTE — Evaluation (Signed)
Physical Therapy Evaluation Patient Details Name: Deanna Spencer MRN: [id number] DOB: 1940-12-02 Today's Date: 06/02/2017   History of Present Illness  Pt is a 77 y/o female who presents s/p R patella-femoral arthroplasty on 06/02/17. PMH significant for SSS s/p PPM 2017, RA, peripheral neuropathy, osteoporosis, OA, MI, gout, AAA, TKA bilateral, R THA feb 2019, Bilateral shoulder arthroplasties, lumbar fusion.  Clinical Impression  Pt admitted with above diagnosis. Pt currently with functional limitations due to the deficits listed below (see PT Problem List). At the time of PT eval pt was able to perform transfers and ambulation with gross min guard assist to min assist for balance support and safety with the RW. Pt having numbness in bilateral feet and reports feeling her LE's were going to give out on her, so gait training was limited this session. Anticipate that pt will progress well with post-op mobility however pt will need to demonstrate safe stair negotiation prior to d/c home. Acutely, pt will benefit from skilled PT to increase their independence and safety with mobility to allow discharge to the venue listed below.     Follow Up Recommendations Outpatient PT;Supervision for mobility/OOB    Equipment Recommendations  Rolling walker with 5" wheels(youth size)    Recommendations for Other Services       Precautions / Restrictions Precautions Precautions: Fall Precaution Comments: Pt was educated on positioning in extension during periods of rest Restrictions Weight Bearing Restrictions: Yes RLE Weight Bearing: Weight bearing as tolerated      Mobility  Bed Mobility Overal bed mobility: Needs Assistance Bed Mobility: Supine to Sit     Supine to sit: Supervision     General bed mobility comments: Pt was able to transition to EOB without assistance. Increased time and use of rails required.   Transfers Overall transfer level: Needs assistance Equipment used: Rolling walker  (2 wheeled) Transfers: Sit to/from Stand Sit to Stand: Min guard         General transfer comment: Hands-on guarding for safety as pt powered up to full stand. Pt demonstrated proper hand placement on seated surface for safety.   Ambulation/Gait Ambulation/Gait assistance: Min assist Ambulation Distance (Feet): 5 Feet Assistive device: Rolling walker (2 wheeled) Gait Pattern/deviations: Step-through pattern;Decreased stride length Gait velocity: Decreased Gait velocity interpretation: <1.31 ft/sec, indicative of household ambulator General Gait Details: Pt was able to take a few pivotal steps around to the recliner; and then to/from [facility]. Pt reports her feet are numb and legs feel shaky so further ambulation was not pursued.   Stairs            Wheelchair Mobility    Modified Rankin (Stroke Patients Only)       Balance Overall balance assessment: Needs assistance Sitting-balance support: Feet supported;No upper extremity supported Sitting balance-Leahy Scale: Fair     Standing balance support: Bilateral upper extremity supported;During functional activity Standing balance-Leahy Scale: Poor Standing balance comment: Reliant on UE support                             Pertinent Vitals/Pain Pain Assessment: Faces Faces Pain Scale: Hurts little more Pain Location: R knee Pain Descriptors / Indicators: Operative site guarding Pain Intervention(s): Limited activity within patient's tolerance;Monitored during session;Repositioned    Home Living Family/patient expects to be discharged to:: Private residence Living Arrangements: Children Available Help at Discharge: Family;Available 24 hours/day Type of Home: House Home Access: Stairs to enter Entrance Stairs-Rails: Chemical engineer  of Steps: 4 Home Layout: One level Home Equipment: Shower seat;Cane - single point      Prior Function Level of Independence: Independent          Comments: "I was doing really well after my hip surgery in February"     Hand Dominance   Dominant Hand: Right    Extremity/Trunk Assessment   Upper Extremity Assessment Upper Extremity Assessment: Overall WFL for tasks assessed    Lower Extremity Assessment Lower Extremity Assessment: RLE deficits/detail;LLE deficits/detail RLE Deficits / Details: Decreased strength and AROM consistent with above mentioned procedure.  RLE: Unable to fully assess due to pain RLE Sensation: decreased light touch(Bilateral feet) LLE Deficits / Details: Pt reports bilateral feet are numb when ambulating    Cervical / Trunk Assessment Cervical / Trunk Assessment: Normal  Communication   Communication: No difficulties  Cognition Arousal/Alertness: Awake/alert Behavior During Therapy: WFL for tasks assessed/performed Overall Cognitive Status: Within Functional Limits for tasks assessed                                        General Comments      Exercises     Assessment/Plan    PT Assessment Patient needs continued PT services  PT Problem List Decreased strength;Decreased range of motion;Decreased activity tolerance;Decreased balance;Decreased mobility;Decreased knowledge of use of DME;Decreased safety awareness;Decreased knowledge of precautions;Pain       PT Treatment Interventions DME instruction;Gait training;Stair training;Functional mobility training;Therapeutic activities;Therapeutic exercise;Neuromuscular re-education;Patient/family education    PT Goals (Current goals can be found in the Care Plan section)  Acute Rehab PT Goals Patient Stated Goal: home at d/c PT Goal Formulation: With patient Time For Goal Achievement: 06/09/17 Potential to Achieve Goals: Good    Frequency Min 5X/week   Barriers to discharge        Co-evaluation               AM-PAC PT "6 Clicks" Daily Activity  Outcome Measure Difficulty turning over in bed (including  adjusting bedclothes, sheets and blankets)?: None Difficulty moving from lying on back to sitting on the side of the bed? : A Little Difficulty sitting down on and standing up from a chair with arms (e.g., wheelchair, bedside commode, etc,.)?: A Little Help needed moving to and from a bed to chair (including a wheelchair)?: A Little Help needed walking in hospital room?: A Little Help needed climbing 3-5 steps with a railing? : A Little 6 Click Score: 19    End of Session Equipment Utilized During Treatment: Gait belt Activity Tolerance: Patient tolerated treatment well Patient left: in chair;with call bell/phone within reach;with chair alarm set;with family/visitor present;with nursing/sitter in room Nurse Communication: Mobility status PT Visit Diagnosis: Unsteadiness on feet (R26.81);Pain;Muscle weakness (generalized) (M62.81);Other symptoms and signs involving the nervous system (R29.898) Pain - Right/Left: Right Pain - part of body: Knee    Time: 0093-8182 PT Time Calculation (min) (ACUTE ONLY): 29 min   Charges:   PT Evaluation $PT Eval Moderate Complexity: 1 Mod PT Treatments $Gait Training: 8-22 mins   PT G Codes:        Rolinda Roan, PT, DPT Acute Rehabilitation Services Pager: [phone]   Thelma Comp 06/02/2017, 3:32 PM

## 2017-06-02 NOTE — Discharge Instructions (Addendum)
INSTRUCTIONS AFTER JOINT REPLACEMENT  ° °o Remove items at home which could result in a fall. This includes throw rugs or furniture in walking pathways °o ICE to the affected joint every three hours while awake for 30 minutes at a time, for at least the first 3-5 days, and then as needed for pain and swelling.  Continue to use ice for pain and swelling. You may notice swelling that will progress down to the foot and ankle.  This is normal after surgery.  Elevate your leg when you are not up walking on it.   °o Continue to use the breathing machine you got in the hospital (incentive spirometer) which will help keep your temperature down.  It is common for your temperature to cycle up and down following surgery, especially at night when you are not up moving around and exerting yourself.  The breathing machine keeps your lungs expanded and your temperature down. ° ° °DIET:  As you were doing prior to hospitalization, we recommend a well-balanced diet. ° °DRESSING / WOUND CARE / SHOWERING ° °Keep the surgical dressing until follow up.  The dressing is water proof, so you can shower without any extra covering.  IF THE DRESSING FALLS OFF or the wound gets wet inside, change the dressing with sterile gauze.  Please use good hand washing techniques before changing the dressing.  Do not use any lotions or creams on the incision until instructed by your surgeon.   ° °ACTIVITY ° °o Increase activity slowly as tolerated, but follow the weight bearing instructions below.   °o No driving for 6 weeks or until further direction given by your physician.  You cannot drive while taking narcotics.  °o No lifting or carrying greater than 10 lbs. until further directed by your surgeon. °o Avoid periods of inactivity such as sitting longer than an hour when not asleep. This helps prevent blood clots.  °o You may return to work once you are authorized by your doctor.  ° ° ° °WEIGHT BEARING  ° °Weight bearing as tolerated with assist  device (walker, cane, etc) as directed, use it as long as suggested by your surgeon or therapist, typically at least 4-6 weeks. ° ° °EXERCISES ° °Results after joint replacement surgery are often greatly improved when you follow the exercise, range of motion and muscle strengthening exercises prescribed by your doctor. Safety measures are also important to protect the joint from further injury. Any time any of these exercises cause you to have increased pain or swelling, decrease what you are doing until you are comfortable again and then slowly increase them. If you have problems or questions, call your caregiver or physical therapist for advice.  ° °Rehabilitation is important following a joint replacement. After just a few days of immobilization, the muscles of the leg can become weakened and shrink (atrophy).  These exercises are designed to build up the tone and strength of the thigh and leg muscles and to improve motion. Often times heat used for twenty to thirty minutes before working out will loosen up your tissues and help with improving the range of motion but do not use heat for the first two weeks following surgery (sometimes heat can increase post-operative swelling).  ° °These exercises can be done on a training (exercise) mat, on the floor, on a table or on a bed. Use whatever works the best and is most comfortable for you.    Use music or television while you are exercising so that   the exercises are a pleasant break in your day. This will make your life better with the exercises acting as a break in your routine that you can look forward to.   Perform all exercises about fifteen times, three times per day or as directed.  You should exercise both the operative leg and the other leg as well. ° °Exercises include: °  °• Quad Sets - Tighten up the muscle on the front of the thigh (Quad) and hold for 5-10 seconds.   °• Straight Leg Raises - With your knee straight (if you were given a brace, keep it on),  lift the leg to 60 degrees, hold for 3 seconds, and slowly lower the leg.  Perform this exercise against resistance later as your leg gets stronger.  °• Leg Slides: Lying on your back, slowly slide your foot toward your buttocks, bending your knee up off the floor (only go as far as is comfortable). Then slowly slide your foot back down until your leg is flat on the floor again.  °• Angel Wings: Lying on your back spread your legs to the side as far apart as you can without causing discomfort.  °• Hamstring Strength:  Lying on your back, push your heel against the floor with your leg straight by tightening up the muscles of your buttocks.  Repeat, but this time bend your knee to a comfortable angle, and push your heel against the floor.  You may put a pillow under the heel to make it more comfortable if necessary.  ° °A rehabilitation program following joint replacement surgery can speed recovery and prevent re-injury in the future due to weakened muscles. Contact your doctor or a physical therapist for more information on knee rehabilitation.  ° ° °CONSTIPATION ° °Constipation is defined medically as fewer than three stools per week and severe constipation as less than one stool per week.  Even if you have a regular bowel pattern at home, your normal regimen is likely to be disrupted due to multiple reasons following surgery.  Combination of anesthesia, postoperative narcotics, change in appetite and fluid intake all can affect your bowels.  ° °YOU MUST use at least one of the following options; they are listed in order of increasing strength to get the job done.  They are all available over the counter, and you may need to use some, POSSIBLY even all of these options:   ° °Drink plenty of fluids (prune juice may be helpful) and high fiber foods °Colace 100 mg by mouth twice a day  °Senokot for constipation as directed and as needed Dulcolax (bisacodyl), take with full glass of water  °Miralax (polyethylene glycol)  once or twice a day as needed. ° °If you have tried all these things and are unable to have a bowel movement in the first 3-4 days after surgery call either your surgeon or your primary doctor.   ° °If you experience loose stools or diarrhea, hold the medications until you stool forms back up.  If your symptoms do not get better within 1 week or if they get worse, check with your doctor.  If you experience "the worst abdominal pain ever" or develop nausea or vomiting, please contact the office immediately for further recommendations for treatment. ° ° °ITCHING:  If you experience itching with your medications, try taking only a single pain pill, or even half a pain pill at a time.  You can also use Benadryl over the counter for itching or also to   help with sleep.   TED HOSE STOCKINGS:  Use stockings on both legs until for at least 2 weeks or as directed by physician office. They may be removed at night for sleeping.  MEDICATIONS:  See your medication summary on the After Visit Summary that nursing will review with you.  You may have some home medications which will be placed on hold until you complete the course of blood thinner medication.  It is important for you to complete the blood thinner medication as prescribed.  PRECAUTIONS:  If you experience chest pain or shortness of breath - call 911 immediately for transfer to the hospital emergency department.   If you develop a fever greater that 101 F, purulent drainage from wound, increased redness or drainage from wound, foul odor from the wound/dressing, or calf pain - CONTACT YOUR SURGEON.                                                   FOLLOW-UP APPOINTMENTS:  If you do not already have a post-op appointment, please call the office for an appointment to be seen by your surgeon.  Guidelines for how soon to be seen are listed in your After Visit Summary, but are typically between 1-4 weeks after surgery.  OTHER INSTRUCTIONS:   Knee  Replacement:  Do not place pillow under knee, focus on keeping the knee straight while resting. CPM instructions: 0-90 degrees, 2 hours in the morning, 2 hours in the afternoon, and 2 hours in the evening. Place foam block, curve side up under heel at all times except when in CPM or when walking.  DO NOT modify, tear, cut, or change the foam block in any way.  MAKE SURE YOU:   Understand these instructions.   Get help right away if you are not doing well or get worse.    Thank you for letting us be a part of your medical care team.  It is a privilege we respect greatly.  We hope these instructions will help you stay on track for a fast and full recovery!    Information on my medicine - ELIQUIS (apixaban)  This medication education was reviewed with me or my healthcare representative as part of my discharge preparation.  The pharmacist that spoke with me during my hospital stay was:  [facility], Margot Chimes, [facility]  Why was Eliquis prescribed for you? Eliquis was prescribed for you to reduce the risk of blood clots forming after orthopedic surgery.    What do You need to know about Eliquis? Take your Eliquis TWICE DAILY - one tablet in the morning and one tablet in the evening with or without food.  It would be best to take the dose about the same time each day.  If you have difficulty swallowing the tablet whole please discuss with your pharmacist how to take the medication safely.  Take Eliquis exactly as prescribed by your doctor and DO NOT stop taking Eliquis without talking to the doctor who prescribed the medication.  Stopping without other medication to take the place of Eliquis may increase your risk of developing a clot.  After discharge, you should have regular check-up appointments with your healthcare provider that is prescribing your Eliquis.  What do you do if you miss a dose? If a dose of ELIQUIS is not taken at the scheduled time,  take it as soon as possible on  the same day and twice-daily administration should be resumed.  The dose should not be doubled to make up for a missed dose.  Do not take more than one tablet of ELIQUIS at the same time.  Important Safety Information A possible side effect of Eliquis is bleeding. You should call your healthcare provider right away if you experience any of the following: ? Bleeding from an injury or your nose that does not stop. ? Unusual colored urine (red or dark brown) or unusual colored stools (red or black). ? Unusual bruising for unknown reasons. ? A serious fall or if you hit your head (even if there is no bleeding).  Some medicines may interact with Eliquis and might increase your risk of bleeding or clotting while on Eliquis. To help avoid this, consult your healthcare provider or pharmacist prior to using any new prescription or non-prescription medications, including herbals, vitamins, non-steroidal anti-inflammatory drugs (NSAIDs) and supplements.  This website has more information on Eliquis (apixaban): http://[email or web address].com/eliquis/home

## 2017-06-02 NOTE — Anesthesia Procedure Notes (Signed)
Procedure Name: MAC Date/Time: 06/02/2017 7:50 AM Performed by: Lowella Dell, CRNA Pre-anesthesia Checklist: Patient identified, Emergency Drugs available, Suction available, Patient being monitored and Timeout performed Patient Re-evaluated:Patient Re-evaluated prior to induction Oxygen Delivery Method: Simple face mask Preoxygenation: Pre-oxygenation with 100% oxygen Placement Confirmation: positive ETCO2 Dental Injury: Teeth and Oropharynx as per pre-operative assessment

## 2017-06-03 LAB — CBC
HEMATOCRIT: 33.3 % — AB (ref 36.0–46.0)
Hemoglobin: 10 g/dL — ABNORMAL LOW (ref 12.0–15.0)
MCH: 29.4 pg (ref 26.0–34.0)
MCHC: 30 g/dL (ref 30.0–36.0)
MCV: 97.9 fL (ref 78.0–100.0)
Platelets: 288 10*3/uL (ref 150–400)
RBC: 3.4 MIL/uL — AB (ref 3.87–5.11)
RDW: 13.3 % (ref 11.5–15.5)
WBC: 8 10*3/uL (ref 4.0–10.5)

## 2017-06-03 LAB — BASIC METABOLIC PANEL
Anion gap: 8 (ref 5–15)
BUN: 13 mg/dL (ref 6–20)
CHLORIDE: 107 mmol/L (ref 101–111)
CO2: 21 mmol/L — ABNORMAL LOW (ref 22–32)
Calcium: 8.5 mg/dL — ABNORMAL LOW (ref 8.9–10.3)
Creatinine, Ser: 0.92 mg/dL (ref 0.44–1.00)
GFR, EST NON AFRICAN AMERICAN: 59 mL/min — AB (ref >60)
Glucose, Bld: 106 mg/dL — ABNORMAL HIGH (ref 65–99)
POTASSIUM: 4.8 mmol/L (ref 3.5–5.1)
SODIUM: 136 mmol/L (ref 135–145)

## 2017-06-03 NOTE — Progress Notes (Signed)
Physical Therapy Treatment Patient Details Name: Deanna Spencer MRN: [id number] DOB: 1940-07-22 Today's Date: 06/03/2017    History of Present Illness Pt is a 77 y/o female who presents s/p R patella-femoral arthroplasty on 06/02/17. PMH significant for SSS s/p PPM 2017, RA, peripheral neuropathy, osteoporosis, OA, MI, gout, AAA, TKA bilateral, R THA feb 2019, Bilateral shoulder arthroplasties, lumbar fusion.    PT Comments    Pt making good progress with functional mobility and successfully completed stair training this session. Plan is for pt to d/c home today with family assistance. She is ready to d/c from a PT perspective.  Pt would continue to benefit from skilled physical therapy services at this time while admitted and after d/c to address the below listed limitations in order to improve overall safety and independence with functional mobility.    Follow Up Recommendations  Outpatient PT;Supervision for mobility/OOB     Equipment Recommendations  Rolling walker with 5" wheels;Other (comment)(youth size)    Recommendations for Other Services       Precautions / Restrictions Precautions Precautions: Fall Precaution Comments: Pt was educated on positioning in extension during periods of rest; PT educated pt on use of Bone Foam Restrictions Weight Bearing Restrictions: Yes RLE Weight Bearing: Weight bearing as tolerated    Mobility  Bed Mobility               General bed mobility comments: pt sitting on toilet in bathroom upon arrival  Transfers Overall transfer level: Needs assistance Equipment used: Rolling walker (2 wheeled) Transfers: Sit to/from Stand Sit to Stand: Min guard         General transfer comment: pt with good technique, min guard for safety  Ambulation/Gait Ambulation/Gait assistance: Min guard Ambulation Distance (Feet): 50 Feet(50' x2 with sitting rest break) Assistive device: Rolling walker (2 wheeled) Gait Pattern/deviations:  Step-through pattern;Decreased stride length Gait velocity: Decreased Gait velocity interpretation: <1.31 ft/sec, indicative of household ambulator General Gait Details: pt steady with RW, min guard for safety   Stairs Stairs: Yes Stairs assistance: Min guard Stair Management: One rail Left;Step to pattern;Forwards Number of Stairs: 6 General stair comments: no concerns or issues; pt already knew proper sequencing   Wheelchair Mobility    Modified Rankin (Stroke Patients Only)       Balance Overall balance assessment: Needs assistance Sitting-balance support: Feet supported;No upper extremity supported Sitting balance-Leahy Scale: Good     Standing balance support: During functional activity;No upper extremity supported Standing balance-Leahy Scale: Fair                              Cognition Arousal/Alertness: Awake/alert Behavior During Therapy: WFL for tasks assessed/performed Overall Cognitive Status: Within Functional Limits for tasks assessed                                        Exercises      General Comments        Pertinent Vitals/Pain Pain Assessment: No/denies pain    Home Living                      Prior Function            PT Goals (current goals can now be found in the care plan section) Acute Rehab PT Goals PT Goal Formulation: With patient Time For Goal  Achievement: 06/09/17 Potential to Achieve Goals: Good Progress towards PT goals: Progressing toward goals    Frequency    Min 5X/week      PT Plan Current plan remains appropriate    Co-evaluation              AM-PAC PT "6 Clicks" Daily Activity  Outcome Measure  Difficulty turning over in bed (including adjusting bedclothes, sheets and blankets)?: None Difficulty moving from lying on back to sitting on the side of the bed? : None Difficulty sitting down on and standing up from a chair with arms (e.g., wheelchair, bedside  commode, etc,.)?: A Little Help needed moving to and from a bed to chair (including a wheelchair)?: None Help needed walking in hospital room?: A Little Help needed climbing 3-5 steps with a railing? : A Little 6 Click Score: 21    End of Session Equipment Utilized During Treatment: Gait belt Activity Tolerance: Patient tolerated treatment well Patient left: in bed;with call bell/phone within reach;Other (comment)(sitting EOB) Nurse Communication: Mobility status PT Visit Diagnosis: Unsteadiness on feet (R26.81);Pain;Muscle weakness (generalized) (M62.81);Other symptoms and signs involving the nervous system (R29.898) Pain - Right/Left: Right Pain - part of body: Knee     Time: 1093-2355 PT Time Calculation (min) (ACUTE ONLY): 69 min  Charges:  $Gait Training: 23-37 mins $Therapeutic Activity: 23-37 mins                    G Codes:       [place], [place], [place] 06/03/2017, 11:41 AM

## 2017-06-03 NOTE — Progress Notes (Signed)
   PATIENT ID: Deanna Spencer   1 Day Post-Op Procedure(s) (LRB): RIGHT PATELLA-FEMORAL ARTHROPLASTY (Right)  Subjective: Reports knee is sore but tolerating things well. Ready to get up with PT this am. Reports okay going home today if things are going well with PT.   Objective:  Vitals:   06/02/17 2251 06/03/17 0347  BP: 134/77 129/85  Pulse: 94 97  Resp: 16   Temp: 97.9 F (36.6 C) 98.3 F (36.8 C)  SpO2: 97% 95%     R knee dressing c/d/i Wiggles toes, distally NVI  Labs:  Recent Labs    06/03/17 0637  HGB 10.0*   Recent Labs    06/03/17 0637  WBC 8.0  RBC 3.40*  HCT 33.3*  PLT 288   Recent Labs    06/03/17 0454  NA 136  K 4.8  CL 107  CO2 21*  BUN 13  CREATININE 0.92  GLUCOSE 106*  CALCIUM 8.5*    Assessment and Plan: 1 day s/p R patella femoral arthroplasty Up with PT again today If doing well ambulating and safe to go home/pain controlled can d/c home today May need to stay another day Scripts in chart  VTE proph: eliquis, scds

## 2017-06-03 NOTE — Discharge Summary (Signed)
Patient ID: Deanna Spencer MRN: [id number] DOB/AGE: Sep 26, 1940 77 y.o.  Admit date: 06/02/2017 Discharge date: 06/03/2017  Admission Diagnoses:  Principal Problem:   Osteoarthritis of right knee Active Problems:   Primary osteoarthritis of right knee   Discharge Diagnoses:  Same  Past Medical History:  Diagnosis Date  . AAA (abdominal aortic aneurysm) ([place])    3.5cm by CT 11/29/16  . Anemia   . Childhood asthma    connected to allergies  . Chronic lower back pain   . Diverticulosis   . Duodenal ulcer   . Dyspnea   . Dysrhythmia    a fib  . GERD (gastroesophageal reflux disease)   . GI bleed    "have had both upper and lower GIB"  . Gout   . Heart attack ([place]) 2017  . Heart murmur   . History of blood transfusion 2017-2018   "related to bleeding ulcers; 47 units PRBC; 2U plasma" (11/23/2016)  . History of hiatal hernia   . Hx of blood clots    in heart  . IBS (irritable bowel syndrome)   . Neuropathy   . Osteoarthritis   . Osteoporosis   . Peripheral neuropathy   . 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 Presence of Watchman left atrial appendage closure device   . Raynaud disease   . Rheumatoid arteritis   . SSS (sick sinus syndrome) ([place])    a. s/p Medtronic PPM placement in 2017  . Stomach ulcer     Surgeries: Procedure(s): RIGHT PATELLA-FEMORAL ARTHROPLASTY on 06/02/2017   Consultants:   Discharged Condition: Improved  Hospital Course: Deanna Spencer is an 77 y.o. female who was admitted 06/02/2017 for operative treatment ofOsteoarthritis of right knee. Patient has severe unremitting pain that affects sleep, daily activities, and work/hobbies. After pre-op clearance the patient was taken to the operating room on 06/02/2017 and underwent  Procedure(s): RIGHT PATELLA-FEMORAL ARTHROPLASTY.    Patient was given perioperative antibiotics:  Anti-infectives (From admission, onward)   Start     Dose/Rate Route  Frequency Ordered Stop   06/02/17 0645  ceFAZolin (ANCEF) IVPB 2g/100 mL premix     2 g 200 mL/hr over 30 Minutes Intravenous On call to O.R. 06/02/17 6010 06/02/17 0740       Patient was given sequential compression devices, early ambulation, and chemoprophylaxis to prevent DVT.  Patient benefited maximally from hospital stay and there were no complications.    Recent vital signs:  Patient Vitals for the past 24 hrs:  BP Temp Temp src Pulse Resp SpO2  06/03/17 1338 137/85 98.1 F (36.7 C) Oral 88 16 96 %  06/03/17 0347 129/85 98.3 F (36.8 C) Oral 97 - 95 %  06/02/17 2251 134/77 97.9 F (36.6 C) Oral 94 16 97 %     Recent laboratory studies:  Recent Labs    06/03/17 0454 06/03/17 0637  WBC  --  8.0  HGB  --  10.0*  HCT  --  33.3*  PLT  --  288  NA 136  --   K 4.8  --   CL 107  --   CO2 21*  --   BUN 13  --   CREATININE 0.92  --   GLUCOSE 106*  --   CALCIUM 8.5*  --      Discharge Medications:   Allergies as of 06/03/2017      Reactions   Demerol [meperidine] Anaphylaxis   Nsaids  Patient has had MULTIPLE 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   apixaban 2.5 MG Tabs tablet Commonly known as:  ELIQUIS Take 1 tablet (2.5 mg total) by mouth 2 (two) times daily.   colchicine 0.6 MG tablet Take 1 tablet (0.6 mg total) by mouth 2 (two) times daily as needed (gout attacks).   ferrous sulfate 325 (65 FE) MG tablet You can resume this in a couple weeks after your feeling better. What changed:    how much to take  how to take this  when to take this  additional instructions   gabapentin 300 MG capsule Commonly known as:   NEURONTIN TAKE 1 CAPSULE(300 MG) BY MOUTH THREE TIMES DAILY   oxyCODONE-acetaminophen 5-325 MG tablet Commonly known as:  PERCOCET/ROXICET Take 1 tablet by mouth every 4 (four) hours as needed for severe pain.   rOPINIRole 0.5 MG tablet Commonly known as:  REQUIP TAKE 1 TABLET(0.5 MG) BY MOUTH THREE TIMES DAILY What changed:    how much to take  how to take this  additional instructions   sucralfate 1 g tablet Commonly known as:  CARAFATE TAKE 1 TABLET(1 GRAM) BY MOUTH FOUR TIMES DAILY AT BEDTIME WITH MEALS   tiZANidine 2 MG tablet Commonly known as:  ZANAFLEX Take 1 tablet (2 mg total) by mouth every 6 (six) hours as needed.   TOVIAZ 4 MG Tb24 tablet Generic drug:  fesoterodine TAKE 1 TABLET(4 MG) BY MOUTH DAILY   traMADol 50 MG tablet Commonly known as:  ULTRAM Take 50 mg by mouth every 6 (six) hours as needed.            Durable Medical Equipment  (From admission, onward)        Start     Ordered   06/03/17 1211  DME Walker rolling  Once    Comments:  Youth walker  Question:  Patient needs a walker to treat with the following condition  Answer:  Status post right knee replacement   06/03/17 1211   06/02/17 1046  DME 3 n 1  Once     06/02/17 1045      Diagnostic Studies: No results found.  Disposition: Discharge disposition: 01-Home or Self Care       Discharge Instructions    Call MD / Call 911   Complete by:  As directed    If you experience chest pain or shortness of breath, CALL 911 and be transported to the hospital emergency room.  If you develope a fever above 101 F, pus (white drainage) or increased drainage or redness at the wound, or calf pain, call your surgeon's office.   Constipation Prevention   Complete by:  As directed    Drink plenty of fluids.  Prune juice may be helpful.  You may use a stool softener, such as Colace (over the counter) 100 mg twice a day.  Use MiraLax (over the counter) for constipation as needed.   Diet - low  sodium heart healthy   Complete by:  As directed    Increase activity slowly as tolerated   Complete by:  As directed       Follow-up  Information    Frederik Pear, MD In 2 weeks.   Specialty:  Orthopedic Surgery Contact information: [address] [address] [phone]            Signed: Grier Mitts 06/03/2017, 2:31 PM

## 2017-06-04 LAB — CBC
HEMATOCRIT: 34.5 % — AB (ref 36.0–46.0)
HEMOGLOBIN: 10.9 g/dL — AB (ref 12.0–15.0)
MCH: 29.9 pg (ref 26.0–34.0)
MCHC: 31.6 g/dL (ref 30.0–36.0)
MCV: 94.8 fL (ref 78.0–100.0)
Platelets: 332 10*3/uL (ref 150–400)
RBC: 3.64 MIL/uL — AB (ref 3.87–5.11)
RDW: 13.2 % (ref 11.5–15.5)
WBC: 7.7 10*3/uL (ref 4.0–10.5)

## 2017-06-04 NOTE — Progress Notes (Signed)
AVS and printed prescriptions reviewed and given to pt. All questions answered to satisfaction. Walker and BSC at bedside. Awaiting pt's family for transportation. Will continue to monitor.

## 2017-06-04 NOTE — Care Management Note (Signed)
Case Management Note  Patient Details  Name: Deanna Spencer MRN: [id number] Date of Birth: 1940/04/23  Subjective/Objective:  Pt is a 77 y/o female who presents s/p R patella-femoral arthroplasty on 06/02/17.  PTA, pt independent, lives at home with adult children.                Action/Plan: Family able to provide 24h care at dc.    Expected Discharge Date:  06/04/17               Expected Discharge Plan:  [place]  In-House Referral:     Discharge planning Services  CM Consult  Post Acute Care Choice:    Choice offered to:  Patient  DME Arranged:    DME Agency:     HH Arranged:  PT [place]:  [facility] (now [facility])  Status of Service:  Completed, signed off  If discussed at [place] of Stay Meetings, dates discussed:    Additional Comments:  Ella Bodo, RN 06/04/2017, 12:23 PM

## 2017-06-04 NOTE — Progress Notes (Signed)
   PATIENT ID: Ersilia A Arora   2 Days Post-Op Procedure(s) (LRB): RIGHT PATELLA-FEMORAL ARTHROPLASTY (Right)  Subjective: Doing great this am. Feels ready to go home. Pain well controlled.   Objective:  Vitals:   06/03/17 2105 06/04/17 0503  BP: 138/89 (!) 138/96  Pulse: 78 (!) 102  Resp:    Temp: 98.3 F (36.8 C) 98.1 F (36.7 C)  SpO2: 96% 96%     R knee dressing c/d/i Wiggles toes, distally NVI    Labs:  Recent Labs    06/03/17 0637 06/04/17 0523  HGB 10.0* 10.9*   Recent Labs    06/03/17 0637 06/04/17 0523  WBC 8.0 7.7  RBC 3.40* 3.64*  HCT 33.3* 34.5*  PLT 288 332   Recent Labs    06/03/17 0454  NA 136  K 4.8  CL 107  CO2 21*  BUN 13  CREATININE 0.92  GLUCOSE 106*  CALCIUM 8.5*    Assessment and Plan: 2 days s/p R patella femoral arthroplasty Cleared by PT for d/c Home today Scripts in chart  VTE proph: eliquis, scds

## 2017-06-05 ENCOUNTER — Encounter (HOSPITAL_COMMUNITY): Payer: Self-pay | Admitting: Orthopedic Surgery

## 2017-06-06 DIAGNOSIS — T84092D Other mechanical complication of internal right knee prosthesis, subsequent encounter: Secondary | ICD-10-CM | POA: Diagnosis not present

## 2017-06-06 DIAGNOSIS — I4891 Unspecified atrial fibrillation: Secondary | ICD-10-CM | POA: Diagnosis not present

## 2017-06-06 DIAGNOSIS — M810 Age-related osteoporosis without current pathological fracture: Secondary | ICD-10-CM | POA: Diagnosis not present

## 2017-06-06 DIAGNOSIS — G629 Polyneuropathy, unspecified: Secondary | ICD-10-CM | POA: Diagnosis not present

## 2017-06-06 DIAGNOSIS — I5022 Chronic systolic (congestive) heart failure: Secondary | ICD-10-CM | POA: Diagnosis not present

## 2017-06-06 DIAGNOSIS — J45909 Unspecified asthma, uncomplicated: Secondary | ICD-10-CM | POA: Diagnosis not present

## 2017-06-08 DIAGNOSIS — G629 Polyneuropathy, unspecified: Secondary | ICD-10-CM | POA: Diagnosis not present

## 2017-06-08 DIAGNOSIS — I4891 Unspecified atrial fibrillation: Secondary | ICD-10-CM | POA: Diagnosis not present

## 2017-06-08 DIAGNOSIS — M810 Age-related osteoporosis without current pathological fracture: Secondary | ICD-10-CM | POA: Diagnosis not present

## 2017-06-08 DIAGNOSIS — I5022 Chronic systolic (congestive) heart failure: Secondary | ICD-10-CM | POA: Diagnosis not present

## 2017-06-08 DIAGNOSIS — J45909 Unspecified asthma, uncomplicated: Secondary | ICD-10-CM | POA: Diagnosis not present

## 2017-06-08 DIAGNOSIS — T84092D Other mechanical complication of internal right knee prosthesis, subsequent encounter: Secondary | ICD-10-CM | POA: Diagnosis not present

## 2017-06-09 ENCOUNTER — Encounter (INDEPENDENT_AMBULATORY_CARE_PROVIDER_SITE_OTHER): Payer: Medicare Other | Admitting: Ophthalmology

## 2017-06-09 DIAGNOSIS — H2702 Aphakia, left eye: Secondary | ICD-10-CM

## 2017-06-12 DIAGNOSIS — T84092D Other mechanical complication of internal right knee prosthesis, subsequent encounter: Secondary | ICD-10-CM | POA: Diagnosis not present

## 2017-06-12 DIAGNOSIS — J45909 Unspecified asthma, uncomplicated: Secondary | ICD-10-CM | POA: Diagnosis not present

## 2017-06-12 DIAGNOSIS — I4891 Unspecified atrial fibrillation: Secondary | ICD-10-CM | POA: Diagnosis not present

## 2017-06-12 DIAGNOSIS — M810 Age-related osteoporosis without current pathological fracture: Secondary | ICD-10-CM | POA: Diagnosis not present

## 2017-06-12 DIAGNOSIS — G629 Polyneuropathy, unspecified: Secondary | ICD-10-CM | POA: Diagnosis not present

## 2017-06-12 DIAGNOSIS — I5022 Chronic systolic (congestive) heart failure: Secondary | ICD-10-CM | POA: Diagnosis not present

## 2017-06-13 DIAGNOSIS — I4891 Unspecified atrial fibrillation: Secondary | ICD-10-CM | POA: Diagnosis not present

## 2017-06-13 DIAGNOSIS — M810 Age-related osteoporosis without current pathological fracture: Secondary | ICD-10-CM | POA: Diagnosis not present

## 2017-06-13 DIAGNOSIS — T84092D Other mechanical complication of internal right knee prosthesis, subsequent encounter: Secondary | ICD-10-CM | POA: Diagnosis not present

## 2017-06-13 DIAGNOSIS — J45909 Unspecified asthma, uncomplicated: Secondary | ICD-10-CM | POA: Diagnosis not present

## 2017-06-13 DIAGNOSIS — G629 Polyneuropathy, unspecified: Secondary | ICD-10-CM | POA: Diagnosis not present

## 2017-06-13 DIAGNOSIS — I5022 Chronic systolic (congestive) heart failure: Secondary | ICD-10-CM | POA: Diagnosis not present

## 2017-06-15 DIAGNOSIS — M1711 Unilateral primary osteoarthritis, right knee: Secondary | ICD-10-CM | POA: Diagnosis not present

## 2017-06-16 DIAGNOSIS — I5022 Chronic systolic (congestive) heart failure: Secondary | ICD-10-CM | POA: Diagnosis not present

## 2017-06-16 DIAGNOSIS — I4891 Unspecified atrial fibrillation: Secondary | ICD-10-CM | POA: Diagnosis not present

## 2017-06-16 DIAGNOSIS — G629 Polyneuropathy, unspecified: Secondary | ICD-10-CM | POA: Diagnosis not present

## 2017-06-16 DIAGNOSIS — M810 Age-related osteoporosis without current pathological fracture: Secondary | ICD-10-CM | POA: Diagnosis not present

## 2017-06-16 DIAGNOSIS — T84092D Other mechanical complication of internal right knee prosthesis, subsequent encounter: Secondary | ICD-10-CM | POA: Diagnosis not present

## 2017-06-16 DIAGNOSIS — J45909 Unspecified asthma, uncomplicated: Secondary | ICD-10-CM | POA: Diagnosis not present

## 2017-06-17 ENCOUNTER — Other Ambulatory Visit: Payer: Self-pay | Admitting: Family Medicine

## 2017-06-21 DIAGNOSIS — Z96651 Presence of right artificial knee joint: Secondary | ICD-10-CM | POA: Diagnosis not present

## 2017-06-27 DIAGNOSIS — Z96651 Presence of right artificial knee joint: Secondary | ICD-10-CM | POA: Diagnosis not present

## 2017-06-27 DIAGNOSIS — Z961 Presence of intraocular lens: Secondary | ICD-10-CM | POA: Diagnosis not present

## 2017-06-28 ENCOUNTER — Ambulatory Visit (INDEPENDENT_AMBULATORY_CARE_PROVIDER_SITE_OTHER): Payer: Medicare Other | Admitting: Family Medicine

## 2017-06-28 ENCOUNTER — Encounter: Payer: Self-pay | Admitting: Family Medicine

## 2017-06-28 ENCOUNTER — Other Ambulatory Visit: Payer: Self-pay

## 2017-06-28 DIAGNOSIS — T63441A Toxic effect of venom of bees, accidental (unintentional), initial encounter: Secondary | ICD-10-CM | POA: Diagnosis not present

## 2017-06-28 DIAGNOSIS — H6120 Impacted cerumen, unspecified ear: Secondary | ICD-10-CM | POA: Insufficient documentation

## 2017-06-28 DIAGNOSIS — H6121 Impacted cerumen, right ear: Secondary | ICD-10-CM

## 2017-06-28 NOTE — Progress Notes (Signed)
Subjective  Deanna Spencer is a 77 y.o. female is presenting with the following  BEE STING To left lower leg about 10 days ago.  Pain is gone but still very itchy.  Thinks was a yellow jacket.  No lip swelling or shortness of breath or redness or fever  EAR WAX Feels her ears are stopped up with wax.  Has happened before.  No discharge or pain or redness   Chief Complaint noted Review of Symptoms - see HPI PMH - Smoking status noted.    Objective Vital Signs reviewed BP 124/70   Pulse 86   Temp 97.8 F (36.6 C) (Oral)   Wt 125 lb 6.4 oz (56.9 kg)   SpO2 97%   BMI 25.33 kg/m   Left lateral lower leg where itches - no redness or streaking or fluctuance or pain Ears - R ear occluded with wax and a narrow canal. L ear clear   Assessments/Plans  See after visit summary for details of patient instuctions  No problem-specific Assessment & Plan notes found for this encounter.

## 2017-06-28 NOTE — Assessment & Plan Note (Signed)
Acute - irrigate

## 2017-06-28 NOTE — Patient Instructions (Addendum)
Nice to see you  For the itch - Use Sarna ointment up to 4 x a day whenever it itches.   If it is not better in 2 weeks or worsening or red please return  You can use the Murine Ear Wax system to keep the wax out of your ears

## 2017-06-28 NOTE — Assessment & Plan Note (Signed)
Residual itching consistent with yellow jacket sting.  Treat symptomatically.  No signs of infection

## 2017-06-30 DIAGNOSIS — Z96651 Presence of right artificial knee joint: Secondary | ICD-10-CM | POA: Diagnosis not present

## 2017-07-12 DIAGNOSIS — M25561 Pain in right knee: Secondary | ICD-10-CM | POA: Diagnosis not present

## 2017-07-12 DIAGNOSIS — Z471 Aftercare following joint replacement surgery: Secondary | ICD-10-CM | POA: Diagnosis not present

## 2017-07-12 DIAGNOSIS — Z96651 Presence of right artificial knee joint: Secondary | ICD-10-CM | POA: Diagnosis not present

## 2017-07-15 ENCOUNTER — Other Ambulatory Visit: Payer: Self-pay | Admitting: Family Medicine

## 2017-07-16 ENCOUNTER — Other Ambulatory Visit: Payer: Self-pay | Admitting: Family Medicine

## 2017-07-19 DIAGNOSIS — Z96651 Presence of right artificial knee joint: Secondary | ICD-10-CM | POA: Diagnosis not present

## 2017-07-21 DIAGNOSIS — Z96651 Presence of right artificial knee joint: Secondary | ICD-10-CM | POA: Diagnosis not present

## 2017-07-24 DIAGNOSIS — H5053 Vertical heterophoria: Secondary | ICD-10-CM | POA: Diagnosis not present

## 2017-07-24 DIAGNOSIS — H5005 Alternating esotropia: Secondary | ICD-10-CM | POA: Diagnosis not present

## 2017-07-24 DIAGNOSIS — Z961 Presence of intraocular lens: Secondary | ICD-10-CM | POA: Diagnosis not present

## 2017-07-25 DIAGNOSIS — Z96651 Presence of right artificial knee joint: Secondary | ICD-10-CM | POA: Diagnosis not present

## 2017-07-27 DIAGNOSIS — Z96651 Presence of right artificial knee joint: Secondary | ICD-10-CM | POA: Diagnosis not present

## 2017-07-31 ENCOUNTER — Ambulatory Visit (INDEPENDENT_AMBULATORY_CARE_PROVIDER_SITE_OTHER): Payer: Medicare Other | Admitting: Family Medicine

## 2017-07-31 ENCOUNTER — Other Ambulatory Visit: Payer: Self-pay

## 2017-07-31 ENCOUNTER — Encounter

## 2017-07-31 ENCOUNTER — Encounter: Payer: Self-pay | Admitting: Family Medicine

## 2017-07-31 VITALS — BP 125/70 | HR 94 | Temp 97.9°F | Ht 64.0 in | Wt 129.4 lb

## 2017-07-31 DIAGNOSIS — H532 Diplopia: Secondary | ICD-10-CM | POA: Diagnosis not present

## 2017-07-31 DIAGNOSIS — Z8719 Personal history of other diseases of the digestive system: Secondary | ICD-10-CM

## 2017-07-31 DIAGNOSIS — R1084 Generalized abdominal pain: Secondary | ICD-10-CM

## 2017-07-31 DIAGNOSIS — H539 Unspecified visual disturbance: Secondary | ICD-10-CM | POA: Diagnosis not present

## 2017-07-31 DIAGNOSIS — K769 Liver disease, unspecified: Secondary | ICD-10-CM

## 2017-07-31 DIAGNOSIS — J302 Other seasonal allergic rhinitis: Secondary | ICD-10-CM | POA: Diagnosis not present

## 2017-07-31 MED ORDER — LORATADINE 10 MG PO TABS: 10.0000 mg | ORAL_TABLET | Freq: Every day | ORAL | 11 refills | Status: DC

## 2017-07-31 NOTE — Patient Instructions (Addendum)
It was great to see you today Deanna Spencer! I always enjoy seeing you and appreciate the trust you put in me to provide you with good care. So, thank you for letting me participate in your care!  Today, we discussed your current symptoms of abdominal pain. Due to your history I am getting labs and imaging today. Our office will schedule to imaging for you and will call you and let you know when to go to the hospital to get the CT scan done. I will call you with results.  I will see you back in one week to discuss your results and to go over the other issues you have been having.  Be well, Harolyn Rutherford, DO PGY-2, [facility]

## 2017-07-31 NOTE — Progress Notes (Signed)
Subjective: Chief Complaint  Patient presents with  . Abdominal Pain   HPI: Deanna Spencer is a 77 y.o. presenting to clinic today to discuss the following:  Abdominal Pain Patient is having abdominal pain similar to what she has had in the past when she had stomach ulcers. She states the pain is not the same in quality or quantity as when she had a perforated ulcer. She endorses intermittent episodes of pain with decreased appetite. She has also been drinking some sodas lately and knows she is not supposed to drink them. This has been occurring for the past month several times per day. She has the pain mostly in her LUQ that radiates to the epigastrum. She has not had any vomiting or coughing up blood. She has had some nausea. Pain is worse when bending over.  Incidental Finding on Left Hepatic Lobe Patient needs Abdominal MRI for follow up on hepatic lobe lesion found incidentally on Abd CT scan. Patient is going out of town but will order for future.  Seasonal Allergies Starting on Claritin.  Vision Changes She was seen for acute vision changes by a pediatric ophthalmologist in [place] who recommended a head MRI. Will order when patient returns from vacation.  Health Maintenance: None     ROS noted in HPI.   Past Medical, Surgical, Social, and Family History Reviewed & Updated per EMR.   Pertinent Historical Findings include:   Social History   Tobacco Use  Smoking Status Never Smoker  Smokeless Tobacco Never Used    Objective: BP 125/70 (BP Location: Right Arm, Patient Position: Sitting, Cuff Size: Normal)   Pulse 94   Temp 97.9 F (36.6 C) (Oral)   Ht 5\' 4"  (1.626 m)   Wt 129 lb 6.4 oz (58.7 kg)   SpO2 98%   BMI 22.21 kg/m  Vitals and nursing notes reviewed  Physical Exam Gen: Alert and Oriented x 3, NAD HEENT: Normocephalic, atraumatic, PERRLA, EOMI CV: RRR, no murmurs, normal S1, S2 split, +2 pulses dorsalis pedis bilaterally, no JVD, no carotid  bruits Resp: CTAB, no wheezing, rales, or rhonchi, comfortable work of breathing Abd: non-distended, non-tender, soft, +bs in all four quadrants, no hepatomegaly, old surgical scars from previous abdominal surgeries Ext: no clubbing, cyanosis, or edema Skin: warm, dry, intact, no rashes  Assessment/Plan:  History of duodenal ulcer Symptoms concerning for ulcer but given her history of perforation I am concerned about this possibility as well. Perforation unlikely given relatively benign exam, no fever, and tolerating foods. CBC and CMP to ensure this is not other GI etiology like pancreatitis or liver inflammation. CBC to ensure she is not having some bleed from ulcer and becoming anemic.   Abdominal CT to rule out perforation and check for ulcer vs gastritis. H.Pylori test performed and was negative.  Seasonal allergies Claritin 10mg  daily  Vision changes Per patient reported recommendation from opthamologist who will send her records of encounter with patient she would like a head MRI to rule out any optic muscle pathology that could be contributing to patient acute episodes of double vision.  Liver lesion MRI of abdomen as recommended to rule out malignancy of incidental lesion found on liver via Abdominal CT scan.   PATIENT EDUCATION PROVIDED: See AVS    Diagnosis and plan along with any newly prescribed medication(s) were discussed in detail with this patient today. The patient verbalized understanding and agreed with the plan. Patient advised if symptoms worsen return to clinic or ER.  Health Maintainance:   Orders Placed This Encounter  Procedures  . CT Abdomen Pelvis W Contrast    Standing Status:   Future    Standing Expiration Date:   10/31/2017    Order Specific Question:   If indicated for the ordered procedure, I authorize the administration of contrast media per Radiology protocol    Answer:   Yes    Order Specific Question:   Preferred imaging location?     Answer:   [facility]    Order Specific Question:   Is Oral Contrast requested for this exam?    Answer:   Yes, Per Radiology protocol    Order Specific Question:   Radiology Contrast Protocol - do NOT remove file path    Answer:   [email or web address]  . CBC  . Comprehensive metabolic panel  . H. pylori breath test    Meds ordered this encounter  Medications  . loratadine (CLARITIN) 10 MG tablet    Sig: Take 1 tablet (10 mg total) by mouth daily.    Dispense:  30 tablet    Refill:  [place], DO 07/31/2017, 1:52 PM PGY-2 [facility]

## 2017-08-01 LAB — CBC
Hematocrit: 38.2 % (ref 34.0–46.6)
Hemoglobin: 12 g/dL (ref 11.1–15.9)
MCH: 28.5 pg (ref 26.6–33.0)
MCHC: 31.4 g/dL — ABNORMAL LOW (ref 31.5–35.7)
MCV: 91 fL (ref 79–97)
Platelets: 356 10*3/uL (ref 150–450)
RBC: 4.21 x10E6/uL (ref 3.77–5.28)
RDW: 15.2 % (ref 12.3–15.4)
WBC: 5.3 10*3/uL (ref 3.4–10.8)

## 2017-08-01 LAB — COMPREHENSIVE METABOLIC PANEL
ALBUMIN: 3.7 g/dL (ref 3.5–4.8)
ALK PHOS: 90 IU/L (ref 39–117)
ALT: 8 IU/L (ref 0–32)
AST: 16 IU/L (ref 0–40)
Albumin/Globulin Ratio: 1.4 (ref 1.2–2.2)
BUN / CREAT RATIO: 20 (ref 12–28)
BUN: 20 mg/dL (ref 8–27)
Bilirubin Total: <0.2 mg/dL (ref 0.0–1.2)
CALCIUM: 9.6 mg/dL (ref 8.7–10.3)
CO2: 21 mmol/L (ref 20–29)
CREATININE: 0.99 mg/dL (ref 0.57–1.00)
Chloride: 106 mmol/L (ref 96–106)
GFR calc Af Amer: 64 mL/min/{1.73_m2} (ref >59)
GFR, EST NON AFRICAN AMERICAN: 56 mL/min/{1.73_m2} — AB (ref >59)
GLUCOSE: 86 mg/dL (ref 65–99)
Globulin, Total: 2.7 g/dL (ref 1.5–4.5)
Potassium: 4.9 mmol/L (ref 3.5–5.2)
SODIUM: 144 mmol/L (ref 134–144)
Total Protein: 6.4 g/dL (ref 6.0–8.5)

## 2017-08-02 DIAGNOSIS — Z96651 Presence of right artificial knee joint: Secondary | ICD-10-CM | POA: Diagnosis not present

## 2017-08-02 LAB — H. PYLORI BREATH TEST: H pylori Breath Test: NEGATIVE

## 2017-08-04 ENCOUNTER — Encounter: Payer: Self-pay | Admitting: Family Medicine

## 2017-08-04 DIAGNOSIS — Z96651 Presence of right artificial knee joint: Secondary | ICD-10-CM | POA: Diagnosis not present

## 2017-08-04 NOTE — Progress Notes (Signed)
Called patient and informed her of normal results. Sending letter with labs to patient.

## 2017-08-07 ENCOUNTER — Ambulatory Visit: Payer: Medicare Other | Admitting: Family Medicine

## 2017-08-07 DIAGNOSIS — K769 Liver disease, unspecified: Secondary | ICD-10-CM | POA: Insufficient documentation

## 2017-08-07 DIAGNOSIS — Z96651 Presence of right artificial knee joint: Secondary | ICD-10-CM | POA: Diagnosis not present

## 2017-08-07 DIAGNOSIS — H539 Unspecified visual disturbance: Secondary | ICD-10-CM

## 2017-08-07 DIAGNOSIS — J302 Other seasonal allergic rhinitis: Secondary | ICD-10-CM

## 2017-08-07 HISTORY — DX: Other seasonal allergic rhinitis: J30.2

## 2017-08-07 HISTORY — DX: Liver disease, unspecified: K76.9

## 2017-08-07 HISTORY — DX: Unspecified visual disturbance: H53.9

## 2017-08-07 NOTE — Assessment & Plan Note (Signed)
MRI of abdomen as recommended to rule out malignancy of incidental lesion found on liver via Abdominal CT scan.

## 2017-08-07 NOTE — Assessment & Plan Note (Signed)
Per patient reported recommendation from opthamologist who will send her records of encounter with patient she would like a head MRI to rule out any optic muscle pathology that could be contributing to patient acute episodes of double vision.

## 2017-08-07 NOTE — Assessment & Plan Note (Signed)
Claritin 10 mg daily

## 2017-08-07 NOTE — Assessment & Plan Note (Signed)
Symptoms concerning for ulcer but given her history of perforation I am concerned about this possibility as well. Perforation unlikely given relatively benign exam, no fever, and tolerating foods. CBC and CMP to ensure this is not other GI etiology like pancreatitis or liver inflammation. CBC to ensure she is not having some bleed from ulcer and becoming anemic.   Abdominal CT to rule out perforation and check for ulcer vs gastritis. H.Pylori test performed and was negative.

## 2017-08-08 ENCOUNTER — Ambulatory Visit (HOSPITAL_COMMUNITY)
Admission: RE | Admit: 2017-08-08 | Discharge: 2017-08-08 | Disposition: A | Discharge status: Home / Self Care | Payer: Medicare Other | Source: Ambulatory Visit | Attending: Family Medicine | Admitting: Family Medicine

## 2017-08-08 DIAGNOSIS — Z9049 Acquired absence of other specified parts of digestive tract: Secondary | ICD-10-CM | POA: Insufficient documentation

## 2017-08-08 DIAGNOSIS — R1084 Generalized abdominal pain: Secondary | ICD-10-CM | POA: Diagnosis not present

## 2017-08-08 DIAGNOSIS — I714 Abdominal aortic aneurysm, without rupture: Secondary | ICD-10-CM | POA: Insufficient documentation

## 2017-08-08 DIAGNOSIS — K5730 Diverticulosis of large intestine without perforation or abscess without bleeding: Secondary | ICD-10-CM | POA: Diagnosis not present

## 2017-08-08 DIAGNOSIS — I700 Atherosclerosis of aorta: Secondary | ICD-10-CM | POA: Diagnosis not present

## 2017-08-08 MED ORDER — IOHEXOL 300 MG/ML  SOLN
100.0000 mL | Freq: Once | INTRAMUSCULAR | Status: AC | PRN
  Administered 2017-08-08: 100 mL via INTRAVENOUS

## 2017-08-09 DIAGNOSIS — Z96651 Presence of right artificial knee joint: Secondary | ICD-10-CM | POA: Diagnosis not present

## 2017-08-09 DIAGNOSIS — M25561 Pain in right knee: Secondary | ICD-10-CM | POA: Diagnosis not present

## 2017-08-09 DIAGNOSIS — Z471 Aftercare following joint replacement surgery: Secondary | ICD-10-CM | POA: Diagnosis not present

## 2017-08-15 DIAGNOSIS — Z950 Presence of cardiac pacemaker: Secondary | ICD-10-CM | POA: Diagnosis not present

## 2017-08-15 DIAGNOSIS — Z6826 Body mass index (BMI) 26.0-26.9, adult: Secondary | ICD-10-CM | POA: Diagnosis not present

## 2017-08-15 DIAGNOSIS — I482 Chronic atrial fibrillation: Secondary | ICD-10-CM | POA: Diagnosis not present

## 2017-08-15 DIAGNOSIS — I495 Sick sinus syndrome: Secondary | ICD-10-CM | POA: Diagnosis not present

## 2017-08-17 ENCOUNTER — Other Ambulatory Visit: Payer: Self-pay | Admitting: Family Medicine

## 2017-08-17 DIAGNOSIS — K289 Gastrojejunal ulcer, unspecified as acute or chronic, without hemorrhage or perforation: Secondary | ICD-10-CM | POA: Diagnosis not present

## 2017-08-21 ENCOUNTER — Telehealth: Payer: Self-pay

## 2017-08-21 NOTE — Telephone Encounter (Signed)
Patient left message she is in [place] and has had diarrhea x 8 days. Wants something called in to stop it or for PCP to call her.  Patient number is [phone]  Pharmacy is [facility] in [place], [place], Therapist, sports [facility])

## 2017-08-22 ENCOUNTER — Other Ambulatory Visit: Payer: Self-pay | Admitting: Family Medicine

## 2017-08-22 MED ORDER — BISMUTH SUBSALICYLATE 262 MG PO CHEW: 524.0000 mg | CHEWABLE_TABLET | ORAL | 0 refills | Status: DC | PRN

## 2017-08-22 NOTE — Progress Notes (Signed)
Patient is traveling and requested medication to help with diarrhea. Sending to pharmacy that she requested.

## 2017-08-23 ENCOUNTER — Telehealth: Payer: Self-pay

## 2017-08-23 NOTE — Telephone Encounter (Signed)
Patient left message requesting PCP to call her regarding diarrhea which she now states has been going on x 14 days and a couple of other issues. Is in [place].  Call back [phone]  Danley Danker, RN [facility])

## 2017-08-31 DIAGNOSIS — R809 Proteinuria, unspecified: Secondary | ICD-10-CM | POA: Diagnosis not present

## 2017-08-31 DIAGNOSIS — I10 Essential (primary) hypertension: Secondary | ICD-10-CM | POA: Diagnosis not present

## 2017-08-31 DIAGNOSIS — R350 Frequency of micturition: Secondary | ICD-10-CM | POA: Diagnosis not present

## 2017-08-31 DIAGNOSIS — R351 Nocturia: Secondary | ICD-10-CM | POA: Diagnosis not present

## 2017-08-31 DIAGNOSIS — N3281 Overactive bladder: Secondary | ICD-10-CM | POA: Diagnosis not present

## 2017-08-31 DIAGNOSIS — N3941 Urge incontinence: Secondary | ICD-10-CM | POA: Diagnosis not present

## 2017-09-01 DIAGNOSIS — K9589 Other complications of other bariatric procedure: Secondary | ICD-10-CM | POA: Diagnosis not present

## 2017-09-01 DIAGNOSIS — K259 Gastric ulcer, unspecified as acute or chronic, without hemorrhage or perforation: Secondary | ICD-10-CM | POA: Diagnosis not present

## 2017-09-01 DIAGNOSIS — K289 Gastrojejunal ulcer, unspecified as acute or chronic, without hemorrhage or perforation: Secondary | ICD-10-CM | POA: Diagnosis not present

## 2017-09-01 DIAGNOSIS — Z9884 Bariatric surgery status: Secondary | ICD-10-CM | POA: Diagnosis not present

## 2017-09-01 DIAGNOSIS — Y838 Other surgical procedures as the cause of abnormal reaction of the patient, or of later complication, without mention of misadventure at the time of the procedure: Secondary | ICD-10-CM | POA: Diagnosis not present

## 2017-09-12 ENCOUNTER — Telehealth: Payer: Self-pay

## 2017-09-12 NOTE — Telephone Encounter (Signed)
Pt called nurse line requesting to speak with PCP. Only wants to talk to Dr. Garlan Fillers- states "its kind of urgent" Call back [phone] Wallace Cullens, RN

## 2017-09-14 NOTE — Telephone Encounter (Signed)
Pt called nurse line upset that she has not received a call back from "Dr. Octavia Bruckner". States she really needs to speak with him. Call back number [phone] Wallace Cullens, RN

## 2017-09-15 ENCOUNTER — Telehealth: Payer: Self-pay | Admitting: Family Medicine

## 2017-09-15 NOTE — Telephone Encounter (Signed)
I sent a staff message to you regarding a question from Dr. Posey Pronto about labwork for patient.

## 2017-09-16 ENCOUNTER — Other Ambulatory Visit: Payer: Self-pay | Admitting: Family Medicine

## 2017-09-19 ENCOUNTER — Telehealth: Payer: Self-pay | Admitting: Family Medicine

## 2017-09-19 NOTE — Telephone Encounter (Signed)
Pt called and said Dr. Garlan Fillers needs to call Dr. Shirlee Limerick concerning some test she wants ran on this pt. Orders have to be put in for these tests and [place] has to call Dr. Shirlee Limerick to know the times the tests are done. Pt said Dr. Garlan Fillers should already have Dr. Jason Nest number. Please advise Pt's phone was messing up really bad, so this was the best I could get out of what she said.

## 2017-09-20 ENCOUNTER — Other Ambulatory Visit: Payer: Self-pay | Admitting: Family Medicine

## 2017-09-22 ENCOUNTER — Other Ambulatory Visit: Payer: Self-pay | Admitting: Family Medicine

## 2017-09-22 ENCOUNTER — Encounter: Payer: Self-pay | Admitting: Family Medicine

## 2017-09-22 ENCOUNTER — Other Ambulatory Visit: Payer: Self-pay

## 2017-09-22 ENCOUNTER — Ambulatory Visit (INDEPENDENT_AMBULATORY_CARE_PROVIDER_SITE_OTHER): Payer: Medicare Other | Admitting: Family Medicine

## 2017-09-22 VITALS — BP 128/72 | HR 76 | Temp 97.5°F | Ht 64.0 in | Wt 135.8 lb

## 2017-09-22 DIAGNOSIS — G6289 Other specified polyneuropathies: Secondary | ICD-10-CM | POA: Diagnosis not present

## 2017-09-22 DIAGNOSIS — M069 Rheumatoid arthritis, unspecified: Secondary | ICD-10-CM | POA: Diagnosis not present

## 2017-09-22 DIAGNOSIS — H532 Diplopia: Secondary | ICD-10-CM

## 2017-09-22 MED ORDER — MELATONIN 1 MG PO CAPS: 1.0000 | ORAL_CAPSULE | Freq: Every day | ORAL | 6 refills | Status: DC

## 2017-09-22 MED ORDER — DICLOFENAC SODIUM 75 MG PO TBEC: 75.0000 mg | DELAYED_RELEASE_TABLET | Freq: Two times a day (BID) | ORAL | 0 refills | Status: DC

## 2017-09-22 MED ORDER — GABAPENTIN 300 MG PO CAPS: ORAL_CAPSULE | ORAL | 6 refills | Status: DC

## 2017-09-22 NOTE — Progress Notes (Signed)
Subjective: Chief Complaint  Patient presents with  . Follow-up     HPI: Deanna Spencer is a 77 y.o. presenting to clinic today to discuss the following:  Deanna Spencer Patient is being seen by Dr. Lenox Ahr, ophthalmologist in [place], [place]. Per Dr. Posey Pronto she would like some labs to rule out Myasthenia Gravis. She is ordering an MRI of her brain and orbits to discover the cause of her double vision but per my phone conversation with Dr. Posey Pronto she believes it is due to weakening of her ocular muscles. Per patient, blurry vision and double vision has been getting worse since my last visit.  Deanna Spencer Patient describes worsening sharp pain running down her legs with numbness and tingling. No change in quality of pain just getting more frequent and slightly more intense.  Health Maintenance: none today     ROS noted in HPI.   Past Medical, Surgical, Social, and Family History Reviewed & Updated per EMR.   Pertinent Historical Findings include:   Social History   Tobacco Use  Smoking Status Never Smoker  Smokeless Tobacco Never Used      Objective: BP 128/72   Pulse 76   Temp (!) 97.5 F (36.4 C) (Oral)   Ht 5\' 4"  (1.626 m)   Wt 135 lb 12.8 oz (61.6 kg)   SpO2 91%   BMI 23.31 kg/m  Vitals and nursing notes reviewed  Physical Exam Gen: Alert and Oriented x 3, NAD HEENT: Normocephalic, atraumatic, PERRLA, EOMI, normal field of vision Neck: trachea midline, no thyroidmegaly, no LAD CV: RRR, no murmurs, normal S1, S2 split Resp: CTAB, no wheezing, rales, or rhonchi, comfortable work of breathing MSK: Moves all four extremities, arthritic changes to MCPs bilaterally Ext: no clubbing, cyanosis, or edema Neuro: No gross deficits Skin: warm, dry, intact, no rashes  No results found for this or any previous visit (from the past 72 hour(s)).  Assessment/Plan:  Rheumatoid arthritis involving both hands (HCC) Diclofenac cream to help with Rheumatoid  arthritis pain. Discussed with patient may help with pain and even though it is NSAID it is not systemic so should not cause issue with her stomach ulcers. Patient is leaving in two weeks to go to [place]. She will be there until March of next year so could not set up appointment with Rheumatology.  Encouraged patient to find a Rheumatologist in [place] as she is not on any controlling medication.  Diplopia Acetylcholine Receptor Ab labs along with MuSK antibodies reflex.  Also obtaining TSH and free T3/T4  Deanna Spencer Worsening. Increased from 300 BID to 600 BID   PATIENT EDUCATION PROVIDED: See AVS    Diagnosis and plan along with any newly prescribed medication(s) were discussed in detail with this patient today. The patient verbalized understanding and agreed with the plan. Patient advised if symptoms worsen return to clinic or ER.   Health Maintainance:   Orders Placed This Encounter  Procedures  . Acetylcholine Receptor Ab, All  . TSH+T4F+T3Free  . Ambulatory referral to Rheumatology    Referral Priority:   Routine    Referral Type:   Consultation    Referral Reason:   Specialty Services Required    Requested Specialty:   Rheumatology    Number of Visits Requested:   1    Meds ordered this encounter  Medications  . Melatonin 1 MG CAPS    Sig: Take 1 capsule (1 mg total) by mouth at bedtime.    Dispense:  30 capsule    Refill:  6  . gabapentin (NEURONTIN) 300 MG capsule    Sig: TAKE 2 CAPSULEs(600 MG) BY MOUTH THREE TIMES DAILY    Dispense:  180 capsule    Refill:  6  . DISCONTD: diclofenac (VOLTAREN) 75 MG EC tablet    Sig: Take 1 tablet (75 mg total) by mouth 2 (two) times daily.    Dispense:  30 tablet    Refill:  0     Deanna Rutherford, DO 09/22/2017, 4:10 PM PGY-2 [place]

## 2017-09-25 ENCOUNTER — Telehealth: Payer: Self-pay | Admitting: *Deleted

## 2017-09-25 NOTE — Telephone Encounter (Signed)
Pt calls back and has the following concerns:  1. She thought the gabapentin was supposed to be increased to 500mg  but the 300mg  was sent in.  2. The diclofenac says not to take with heart conditions, she wants to make sure its ok to take.  Will forward to MD.  Kamilo Och, Salome Spotted, [place]

## 2017-09-27 DIAGNOSIS — H532 Diplopia: Secondary | ICD-10-CM

## 2017-09-27 DIAGNOSIS — M069 Rheumatoid arthritis, unspecified: Secondary | ICD-10-CM | POA: Insufficient documentation

## 2017-09-27 HISTORY — DX: Diplopia: H53.2

## 2017-09-27 HISTORY — DX: Rheumatoid arthritis, unspecified: M06.9

## 2017-09-27 NOTE — Assessment & Plan Note (Signed)
Worsening. Increased from 300 BID to 600 BID

## 2017-09-27 NOTE — Assessment & Plan Note (Signed)
Acetylcholine Receptor Ab labs along with MuSK antibodies reflex.  Also obtaining TSH and free T3/T4

## 2017-09-27 NOTE — Assessment & Plan Note (Signed)
Diclofenac cream to help with Rheumatoid arthritis pain. Discussed with patient may help with pain and even though it is NSAID it is not systemic so should not cause issue with her stomach ulcers. Patient is leaving in two weeks to go to [place]. She will be there until March of next year so could not set up appointment with Rheumatology.  Encouraged patient to find a Rheumatologist in [place] as she is not on any controlling medication.

## 2017-09-29 ENCOUNTER — Other Ambulatory Visit: Payer: Self-pay | Admitting: Ophthalmology

## 2017-09-29 DIAGNOSIS — H5053 Vertical heterophoria: Secondary | ICD-10-CM

## 2017-09-29 DIAGNOSIS — H5005 Alternating esotropia: Secondary | ICD-10-CM

## 2017-09-29 LAB — SPECIMEN STATUS REPORT

## 2017-09-29 LAB — TSH+T4F+T3FREE
FREE T4: 0.95 ng/dL (ref 0.82–1.77)
T3, Free: 3 pg/mL (ref 2.0–4.4)
TSH: 0.654 u[IU]/mL (ref 0.450–4.500)

## 2017-10-03 LAB — MUSK ANTIBODIES: MuSK Antibodies: <1 U/mL

## 2017-10-03 LAB — ACHR ABS WITH REFLEX TO MUSK: AChR Binding Ab, Serum: 0.04 nmol/L (ref 0.00–0.24)

## 2017-10-03 LAB — SPECIMEN STATUS REPORT

## 2017-10-11 ENCOUNTER — Telehealth: Payer: Self-pay

## 2017-10-11 NOTE — Telephone Encounter (Signed)
Pt LVM on nurse line, it was very hard to hear, all I could get was her date of birth and last night. I tried calling patient and got her daughter, her daughter was unsure what she might have wanted. [name] will continue to reach her and let her know we returned her call.

## 2017-10-12 NOTE — Telephone Encounter (Signed)
Pt is calling to check status again.  She is concerned about the Diclofenac because of the ulcer warnings ( she has a hx of this and had a transfusion) and because of her pacemaker.  She is still waiting on the increase Rx of gabapentin ( last phone note) Will forward to MD. Dariah Mcsorley, Salome Spotted, [place]

## 2017-10-12 NOTE — Telephone Encounter (Signed)
Pt called nurse line. Would like to talk to Dr. Garlan Fillers about a medication he wants her to take. The medication said do not take if you have stomach bleeding. She has this and would like to speak to Dr. Garlan Fillers to make sure this medication is ok for her to take. She is very concerned. Please call her on [phone]. Ottis Stain, CMA

## 2017-10-13 ENCOUNTER — Other Ambulatory Visit: Payer: Self-pay | Admitting: Family Medicine

## 2017-10-13 MED ORDER — GABAPENTIN 300 MG PO CAPS: ORAL_CAPSULE | ORAL | 6 refills | Status: DC

## 2017-10-13 NOTE — Progress Notes (Signed)
Refilling gabapentin to relfect increase in dose

## 2017-10-13 NOTE — Telephone Encounter (Signed)
Patient calling a third time about Diclofenac. Please call her at [phone], if Allene Dillon, answers it is okay to speak with her also.  Danley Danker, RN [facility] RN)

## 2017-10-16 ENCOUNTER — Telehealth: Payer: Self-pay

## 2017-10-16 ENCOUNTER — Other Ambulatory Visit: Payer: Self-pay | Admitting: Family Medicine

## 2017-10-16 ENCOUNTER — Encounter: Payer: Self-pay | Admitting: Family Medicine

## 2017-10-16 NOTE — Progress Notes (Signed)
Please fax to [phone]  Patient called and left message. I returned her call later that morning and her daughter answered. Daughter states her mom has been taking CBD oil and needed a prescription. I did explain to the daugher that I could not give her a prescription for CBD oil but I could write a letter stating I have discussed the risks/benefits of its use with her mom and while I don't recommend it I won't stop her from taking it as she is capable of making her own medical decisions.

## 2017-10-16 NOTE — Progress Notes (Signed)
Patient called clinic this morning requesting prescription for CBD oil. I returned the call and spoke with the daughter. I explained I could not write a prescription for CBD oil but would write a letter stating Deanna Spencer is capable of making her own health decisions, was made aware of the risks of taking CBD oil and that it had no proven benefit for her RA.   Please Fax letter to [phone]

## 2017-10-16 NOTE — Telephone Encounter (Signed)
Pt called nurse line very frantic asking to speak with PCP "immediately." Pt stated this over and over again. I informed her you were not in clinic today and I would send a message to you. Please call her when you can.

## 2017-10-17 ENCOUNTER — Other Ambulatory Visit: Payer: Self-pay | Admitting: Family Medicine

## 2017-10-17 NOTE — Progress Notes (Signed)
Printing letter for patient

## 2017-10-18 ENCOUNTER — Other Ambulatory Visit: Payer: Self-pay | Admitting: Family Medicine

## 2017-11-14 ENCOUNTER — Other Ambulatory Visit: Payer: Self-pay | Admitting: Family Medicine

## 2017-11-15 ENCOUNTER — Other Ambulatory Visit: Payer: Self-pay | Admitting: *Deleted

## 2017-11-16 MED ORDER — GABAPENTIN 300 MG PO CAPS: ORAL_CAPSULE | ORAL | 6 refills | Status: DC

## 2017-11-24 ENCOUNTER — Other Ambulatory Visit: Payer: Self-pay | Admitting: Family Medicine

## 2017-12-11 ENCOUNTER — Other Ambulatory Visit: Payer: Self-pay | Admitting: Family Medicine

## 2017-12-13 ENCOUNTER — Other Ambulatory Visit: Payer: Self-pay | Admitting: Family Medicine

## 2017-12-13 ENCOUNTER — Other Ambulatory Visit: Payer: Self-pay | Admitting: *Deleted

## 2017-12-13 NOTE — Telephone Encounter (Signed)
Pt wants to know if she can have a refill on her zanaflex.  She fell yesterday and thinks she pulled a muscle.  She has a few zanaflex left but has finished them and states that they really helped.   She is in [place] now and does not want to go to an urgent care if she can avoid it.  Will forward to MD. Latina Frank, Salome Spotted, [place]

## 2017-12-14 MED ORDER — TIZANIDINE HCL 2 MG PO TABS: 2.0000 mg | ORAL_TABLET | Freq: Four times a day (QID) | ORAL | 0 refills | Status: DC | PRN

## 2017-12-18 DIAGNOSIS — I482 Chronic atrial fibrillation, unspecified: Secondary | ICD-10-CM | POA: Diagnosis not present

## 2017-12-18 DIAGNOSIS — K5730 Diverticulosis of large intestine without perforation or abscess without bleeding: Secondary | ICD-10-CM | POA: Diagnosis not present

## 2017-12-18 DIAGNOSIS — I5022 Chronic systolic (congestive) heart failure: Secondary | ICD-10-CM | POA: Diagnosis present

## 2017-12-18 DIAGNOSIS — Z9884 Bariatric surgery status: Secondary | ICD-10-CM | POA: Diagnosis not present

## 2017-12-18 DIAGNOSIS — I4891 Unspecified atrial fibrillation: Secondary | ICD-10-CM | POA: Diagnosis not present

## 2017-12-18 DIAGNOSIS — Z452 Encounter for adjustment and management of vascular access device: Secondary | ICD-10-CM | POA: Diagnosis not present

## 2017-12-18 DIAGNOSIS — E785 Hyperlipidemia, unspecified: Secondary | ICD-10-CM | POA: Diagnosis not present

## 2017-12-18 DIAGNOSIS — Z981 Arthrodesis status: Secondary | ICD-10-CM | POA: Diagnosis not present

## 2017-12-18 DIAGNOSIS — E876 Hypokalemia: Secondary | ICD-10-CM | POA: Diagnosis not present

## 2017-12-18 DIAGNOSIS — K5792 Diverticulitis of intestine, part unspecified, without perforation or abscess without bleeding: Secondary | ICD-10-CM | POA: Diagnosis not present

## 2017-12-18 DIAGNOSIS — K9589 Other complications of other bariatric procedure: Secondary | ICD-10-CM | POA: Diagnosis not present

## 2017-12-18 DIAGNOSIS — R1032 Left lower quadrant pain: Secondary | ICD-10-CM | POA: Diagnosis not present

## 2017-12-18 DIAGNOSIS — I480 Paroxysmal atrial fibrillation: Secondary | ICD-10-CM | POA: Diagnosis not present

## 2017-12-18 DIAGNOSIS — Z8711 Personal history of peptic ulcer disease: Secondary | ICD-10-CM | POA: Diagnosis not present

## 2017-12-18 DIAGNOSIS — I2510 Atherosclerotic heart disease of native coronary artery without angina pectoris: Secondary | ICD-10-CM | POA: Diagnosis present

## 2017-12-18 DIAGNOSIS — Z950 Presence of cardiac pacemaker: Secondary | ICD-10-CM | POA: Diagnosis not present

## 2017-12-18 DIAGNOSIS — I110 Hypertensive heart disease with heart failure: Secondary | ICD-10-CM | POA: Diagnosis present

## 2017-12-18 DIAGNOSIS — M810 Age-related osteoporosis without current pathological fracture: Secondary | ICD-10-CM | POA: Diagnosis present

## 2017-12-18 DIAGNOSIS — I255 Ischemic cardiomyopathy: Secondary | ICD-10-CM | POA: Diagnosis present

## 2017-12-18 DIAGNOSIS — R1084 Generalized abdominal pain: Secondary | ICD-10-CM | POA: Diagnosis not present

## 2017-12-18 DIAGNOSIS — K659 Peritonitis, unspecified: Secondary | ICD-10-CM | POA: Diagnosis not present

## 2017-12-18 DIAGNOSIS — K255 Chronic or unspecified gastric ulcer with perforation: Secondary | ICD-10-CM | POA: Diagnosis present

## 2017-12-18 DIAGNOSIS — K289 Gastrojejunal ulcer, unspecified as acute or chronic, without hemorrhage or perforation: Secondary | ICD-10-CM | POA: Diagnosis not present

## 2017-12-18 DIAGNOSIS — R109 Unspecified abdominal pain: Secondary | ICD-10-CM | POA: Diagnosis not present

## 2017-12-18 DIAGNOSIS — R079 Chest pain, unspecified: Secondary | ICD-10-CM | POA: Diagnosis not present

## 2017-12-18 DIAGNOSIS — K9189 Other postprocedural complications and disorders of digestive system: Secondary | ICD-10-CM | POA: Diagnosis not present

## 2017-12-18 DIAGNOSIS — K650 Generalized (acute) peritonitis: Secondary | ICD-10-CM | POA: Diagnosis not present

## 2017-12-18 DIAGNOSIS — N390 Urinary tract infection, site not specified: Secondary | ICD-10-CM | POA: Diagnosis not present

## 2017-12-18 DIAGNOSIS — K5780 Diverticulitis of intestine, part unspecified, with perforation and abscess without bleeding: Secondary | ICD-10-CM | POA: Diagnosis not present

## 2017-12-18 DIAGNOSIS — K631 Perforation of intestine (nontraumatic): Secondary | ICD-10-CM | POA: Diagnosis not present

## 2017-12-18 DIAGNOSIS — K5740 Diverticulitis of both small and large intestine with perforation and abscess without bleeding: Secondary | ICD-10-CM | POA: Diagnosis not present

## 2017-12-18 NOTE — Telephone Encounter (Signed)
error 

## 2017-12-26 DIAGNOSIS — M810 Age-related osteoporosis without current pathological fracture: Secondary | ICD-10-CM | POA: Diagnosis not present

## 2017-12-26 DIAGNOSIS — I255 Ischemic cardiomyopathy: Secondary | ICD-10-CM | POA: Diagnosis not present

## 2017-12-26 DIAGNOSIS — Z452 Encounter for adjustment and management of vascular access device: Secondary | ICD-10-CM | POA: Diagnosis not present

## 2017-12-26 DIAGNOSIS — K5780 Diverticulitis of intestine, part unspecified, with perforation and abscess without bleeding: Secondary | ICD-10-CM | POA: Diagnosis not present

## 2017-12-26 DIAGNOSIS — I110 Hypertensive heart disease with heart failure: Secondary | ICD-10-CM | POA: Diagnosis not present

## 2017-12-26 DIAGNOSIS — I5020 Unspecified systolic (congestive) heart failure: Secondary | ICD-10-CM | POA: Diagnosis not present

## 2017-12-27 DIAGNOSIS — I110 Hypertensive heart disease with heart failure: Secondary | ICD-10-CM | POA: Diagnosis not present

## 2017-12-27 DIAGNOSIS — Z452 Encounter for adjustment and management of vascular access device: Secondary | ICD-10-CM | POA: Diagnosis not present

## 2017-12-27 DIAGNOSIS — I5020 Unspecified systolic (congestive) heart failure: Secondary | ICD-10-CM | POA: Diagnosis not present

## 2017-12-27 DIAGNOSIS — I255 Ischemic cardiomyopathy: Secondary | ICD-10-CM | POA: Diagnosis not present

## 2017-12-27 DIAGNOSIS — M810 Age-related osteoporosis without current pathological fracture: Secondary | ICD-10-CM | POA: Diagnosis not present

## 2017-12-27 DIAGNOSIS — K5780 Diverticulitis of intestine, part unspecified, with perforation and abscess without bleeding: Secondary | ICD-10-CM | POA: Diagnosis not present

## 2017-12-28 DIAGNOSIS — M810 Age-related osteoporosis without current pathological fracture: Secondary | ICD-10-CM | POA: Diagnosis not present

## 2017-12-28 DIAGNOSIS — K5780 Diverticulitis of intestine, part unspecified, with perforation and abscess without bleeding: Secondary | ICD-10-CM | POA: Diagnosis not present

## 2017-12-28 DIAGNOSIS — I5020 Unspecified systolic (congestive) heart failure: Secondary | ICD-10-CM | POA: Diagnosis not present

## 2017-12-28 DIAGNOSIS — K5720 Diverticulitis of large intestine with perforation and abscess without bleeding: Secondary | ICD-10-CM | POA: Diagnosis not present

## 2017-12-28 DIAGNOSIS — I255 Ischemic cardiomyopathy: Secondary | ICD-10-CM | POA: Diagnosis not present

## 2017-12-28 DIAGNOSIS — Z452 Encounter for adjustment and management of vascular access device: Secondary | ICD-10-CM | POA: Diagnosis not present

## 2017-12-28 DIAGNOSIS — K5730 Diverticulosis of large intestine without perforation or abscess without bleeding: Secondary | ICD-10-CM | POA: Diagnosis not present

## 2017-12-28 DIAGNOSIS — I480 Paroxysmal atrial fibrillation: Secondary | ICD-10-CM | POA: Diagnosis not present

## 2017-12-28 DIAGNOSIS — I110 Hypertensive heart disease with heart failure: Secondary | ICD-10-CM | POA: Diagnosis not present

## 2017-12-29 DIAGNOSIS — Z452 Encounter for adjustment and management of vascular access device: Secondary | ICD-10-CM | POA: Diagnosis not present

## 2017-12-29 DIAGNOSIS — Z7901 Long term (current) use of anticoagulants: Secondary | ICD-10-CM | POA: Diagnosis not present

## 2017-12-29 DIAGNOSIS — I110 Hypertensive heart disease with heart failure: Secondary | ICD-10-CM | POA: Diagnosis not present

## 2017-12-29 DIAGNOSIS — K5780 Diverticulitis of intestine, part unspecified, with perforation and abscess without bleeding: Secondary | ICD-10-CM | POA: Diagnosis not present

## 2017-12-29 DIAGNOSIS — M810 Age-related osteoporosis without current pathological fracture: Secondary | ICD-10-CM | POA: Diagnosis not present

## 2017-12-29 DIAGNOSIS — Z950 Presence of cardiac pacemaker: Secondary | ICD-10-CM | POA: Diagnosis not present

## 2017-12-29 DIAGNOSIS — I255 Ischemic cardiomyopathy: Secondary | ICD-10-CM | POA: Diagnosis not present

## 2017-12-29 DIAGNOSIS — I4891 Unspecified atrial fibrillation: Secondary | ICD-10-CM | POA: Diagnosis not present

## 2017-12-29 DIAGNOSIS — I5020 Unspecified systolic (congestive) heart failure: Secondary | ICD-10-CM | POA: Diagnosis not present

## 2017-12-30 DIAGNOSIS — M810 Age-related osteoporosis without current pathological fracture: Secondary | ICD-10-CM | POA: Diagnosis not present

## 2017-12-30 DIAGNOSIS — K5780 Diverticulitis of intestine, part unspecified, with perforation and abscess without bleeding: Secondary | ICD-10-CM | POA: Diagnosis not present

## 2017-12-30 DIAGNOSIS — I110 Hypertensive heart disease with heart failure: Secondary | ICD-10-CM | POA: Diagnosis not present

## 2017-12-30 DIAGNOSIS — I255 Ischemic cardiomyopathy: Secondary | ICD-10-CM | POA: Diagnosis not present

## 2017-12-30 DIAGNOSIS — Z452 Encounter for adjustment and management of vascular access device: Secondary | ICD-10-CM | POA: Diagnosis not present

## 2017-12-30 DIAGNOSIS — I5020 Unspecified systolic (congestive) heart failure: Secondary | ICD-10-CM | POA: Diagnosis not present

## 2017-12-31 DIAGNOSIS — I255 Ischemic cardiomyopathy: Secondary | ICD-10-CM | POA: Diagnosis not present

## 2017-12-31 DIAGNOSIS — I110 Hypertensive heart disease with heart failure: Secondary | ICD-10-CM | POA: Diagnosis not present

## 2017-12-31 DIAGNOSIS — Z452 Encounter for adjustment and management of vascular access device: Secondary | ICD-10-CM | POA: Diagnosis not present

## 2017-12-31 DIAGNOSIS — K5780 Diverticulitis of intestine, part unspecified, with perforation and abscess without bleeding: Secondary | ICD-10-CM | POA: Diagnosis not present

## 2017-12-31 DIAGNOSIS — I5020 Unspecified systolic (congestive) heart failure: Secondary | ICD-10-CM | POA: Diagnosis not present

## 2017-12-31 DIAGNOSIS — M810 Age-related osteoporosis without current pathological fracture: Secondary | ICD-10-CM | POA: Diagnosis not present

## 2018-01-01 DIAGNOSIS — K5780 Diverticulitis of intestine, part unspecified, with perforation and abscess without bleeding: Secondary | ICD-10-CM | POA: Diagnosis not present

## 2018-01-01 DIAGNOSIS — I255 Ischemic cardiomyopathy: Secondary | ICD-10-CM | POA: Diagnosis not present

## 2018-01-01 DIAGNOSIS — M810 Age-related osteoporosis without current pathological fracture: Secondary | ICD-10-CM | POA: Diagnosis not present

## 2018-01-01 DIAGNOSIS — I110 Hypertensive heart disease with heart failure: Secondary | ICD-10-CM | POA: Diagnosis not present

## 2018-01-01 DIAGNOSIS — K251 Acute gastric ulcer with perforation: Secondary | ICD-10-CM | POA: Diagnosis not present

## 2018-01-01 DIAGNOSIS — Z452 Encounter for adjustment and management of vascular access device: Secondary | ICD-10-CM | POA: Diagnosis not present

## 2018-01-01 DIAGNOSIS — I5020 Unspecified systolic (congestive) heart failure: Secondary | ICD-10-CM | POA: Diagnosis not present

## 2018-01-05 DIAGNOSIS — I25119 Atherosclerotic heart disease of native coronary artery with unspecified angina pectoris: Secondary | ICD-10-CM | POA: Diagnosis not present

## 2018-01-08 DIAGNOSIS — K251 Acute gastric ulcer with perforation: Secondary | ICD-10-CM | POA: Diagnosis not present

## 2018-01-09 ENCOUNTER — Other Ambulatory Visit: Payer: Self-pay | Admitting: Family Medicine

## 2018-01-11 ENCOUNTER — Other Ambulatory Visit: Payer: Self-pay | Admitting: Family Medicine

## 2018-01-12 DIAGNOSIS — K5780 Diverticulitis of intestine, part unspecified, with perforation and abscess without bleeding: Secondary | ICD-10-CM | POA: Diagnosis not present

## 2018-01-12 DIAGNOSIS — R197 Diarrhea, unspecified: Secondary | ICD-10-CM | POA: Diagnosis not present

## 2018-01-12 DIAGNOSIS — K219 Gastro-esophageal reflux disease without esophagitis: Secondary | ICD-10-CM | POA: Diagnosis not present

## 2018-01-12 DIAGNOSIS — R10816 Epigastric abdominal tenderness: Secondary | ICD-10-CM | POA: Diagnosis not present

## 2018-01-12 DIAGNOSIS — Z87898 Personal history of other specified conditions: Secondary | ICD-10-CM | POA: Diagnosis not present

## 2018-01-15 DIAGNOSIS — K251 Acute gastric ulcer with perforation: Secondary | ICD-10-CM | POA: Diagnosis not present

## 2018-01-17 DIAGNOSIS — I255 Ischemic cardiomyopathy: Secondary | ICD-10-CM | POA: Diagnosis not present

## 2018-01-22 DIAGNOSIS — N390 Urinary tract infection, site not specified: Secondary | ICD-10-CM | POA: Diagnosis not present

## 2018-01-23 DIAGNOSIS — I10 Essential (primary) hypertension: Secondary | ICD-10-CM | POA: Diagnosis not present

## 2018-01-23 DIAGNOSIS — K255 Chronic or unspecified gastric ulcer with perforation: Secondary | ICD-10-CM | POA: Diagnosis not present

## 2018-01-23 DIAGNOSIS — I255 Ischemic cardiomyopathy: Secondary | ICD-10-CM | POA: Diagnosis not present

## 2018-01-23 DIAGNOSIS — I25119 Atherosclerotic heart disease of native coronary artery with unspecified angina pectoris: Secondary | ICD-10-CM | POA: Diagnosis not present

## 2018-01-26 DIAGNOSIS — I480 Paroxysmal atrial fibrillation: Secondary | ICD-10-CM | POA: Diagnosis not present

## 2018-01-26 DIAGNOSIS — N183 Chronic kidney disease, stage 3 (moderate): Secondary | ICD-10-CM | POA: Diagnosis not present

## 2018-01-26 DIAGNOSIS — K5730 Diverticulosis of large intestine without perforation or abscess without bleeding: Secondary | ICD-10-CM | POA: Diagnosis not present

## 2018-01-26 DIAGNOSIS — Z8711 Personal history of peptic ulcer disease: Secondary | ICD-10-CM | POA: Diagnosis not present

## 2018-01-26 DIAGNOSIS — K5720 Diverticulitis of large intestine with perforation and abscess without bleeding: Secondary | ICD-10-CM | POA: Diagnosis not present

## 2018-01-30 DIAGNOSIS — I209 Angina pectoris, unspecified: Secondary | ICD-10-CM | POA: Diagnosis not present

## 2018-01-30 DIAGNOSIS — I472 Ventricular tachycardia: Secondary | ICD-10-CM | POA: Diagnosis not present

## 2018-02-06 DIAGNOSIS — I25119 Atherosclerotic heart disease of native coronary artery with unspecified angina pectoris: Secondary | ICD-10-CM | POA: Diagnosis not present

## 2018-02-06 DIAGNOSIS — I340 Nonrheumatic mitral (valve) insufficiency: Secondary | ICD-10-CM | POA: Diagnosis not present

## 2018-02-06 DIAGNOSIS — I472 Ventricular tachycardia: Secondary | ICD-10-CM | POA: Diagnosis not present

## 2018-02-06 DIAGNOSIS — I255 Ischemic cardiomyopathy: Secondary | ICD-10-CM | POA: Diagnosis not present

## 2018-02-06 DIAGNOSIS — I4891 Unspecified atrial fibrillation: Secondary | ICD-10-CM | POA: Diagnosis not present

## 2018-02-07 DIAGNOSIS — I2589 Other forms of chronic ischemic heart disease: Secondary | ICD-10-CM | POA: Diagnosis not present

## 2018-02-07 DIAGNOSIS — I4891 Unspecified atrial fibrillation: Secondary | ICD-10-CM | POA: Diagnosis not present

## 2018-02-07 DIAGNOSIS — I472 Ventricular tachycardia: Secondary | ICD-10-CM | POA: Diagnosis not present

## 2018-02-07 DIAGNOSIS — I25119 Atherosclerotic heart disease of native coronary artery with unspecified angina pectoris: Secondary | ICD-10-CM | POA: Diagnosis not present

## 2018-02-07 DIAGNOSIS — I340 Nonrheumatic mitral (valve) insufficiency: Secondary | ICD-10-CM | POA: Diagnosis not present

## 2018-02-07 DIAGNOSIS — I255 Ischemic cardiomyopathy: Secondary | ICD-10-CM | POA: Diagnosis not present

## 2018-02-13 DIAGNOSIS — Z9884 Bariatric surgery status: Secondary | ICD-10-CM | POA: Diagnosis not present

## 2018-02-13 DIAGNOSIS — R450 Nervousness: Secondary | ICD-10-CM | POA: Diagnosis not present

## 2018-02-13 DIAGNOSIS — K5732 Diverticulitis of large intestine without perforation or abscess without bleeding: Secondary | ICD-10-CM | POA: Diagnosis not present

## 2018-02-13 DIAGNOSIS — K229 Disease of esophagus, unspecified: Secondary | ICD-10-CM | POA: Diagnosis not present

## 2018-02-13 DIAGNOSIS — K219 Gastro-esophageal reflux disease without esophagitis: Secondary | ICD-10-CM | POA: Diagnosis not present

## 2018-02-13 DIAGNOSIS — K626 Ulcer of anus and rectum: Secondary | ICD-10-CM | POA: Diagnosis not present

## 2018-02-13 DIAGNOSIS — K5289 Other specified noninfective gastroenteritis and colitis: Secondary | ICD-10-CM | POA: Diagnosis not present

## 2018-02-13 DIAGNOSIS — K210 Gastro-esophageal reflux disease with esophagitis: Secondary | ICD-10-CM | POA: Diagnosis not present

## 2018-02-13 DIAGNOSIS — Z8679 Personal history of other diseases of the circulatory system: Secondary | ICD-10-CM | POA: Diagnosis not present

## 2018-02-13 DIAGNOSIS — I10 Essential (primary) hypertension: Secondary | ICD-10-CM | POA: Diagnosis not present

## 2018-02-19 DIAGNOSIS — I255 Ischemic cardiomyopathy: Secondary | ICD-10-CM | POA: Diagnosis not present

## 2018-03-05 ENCOUNTER — Other Ambulatory Visit: Payer: Self-pay | Admitting: Family Medicine

## 2018-03-05 DIAGNOSIS — K219 Gastro-esophageal reflux disease without esophagitis: Secondary | ICD-10-CM | POA: Diagnosis not present

## 2018-03-05 DIAGNOSIS — K626 Ulcer of anus and rectum: Secondary | ICD-10-CM | POA: Diagnosis not present

## 2018-03-05 DIAGNOSIS — K649 Unspecified hemorrhoids: Secondary | ICD-10-CM | POA: Diagnosis not present

## 2018-03-05 DIAGNOSIS — R10812 Left upper quadrant abdominal tenderness: Secondary | ICD-10-CM | POA: Diagnosis not present

## 2018-03-05 DIAGNOSIS — R194 Change in bowel habit: Secondary | ICD-10-CM | POA: Diagnosis not present

## 2018-03-13 DIAGNOSIS — Z961 Presence of intraocular lens: Secondary | ICD-10-CM | POA: Diagnosis not present

## 2018-03-13 DIAGNOSIS — H43813 Vitreous degeneration, bilateral: Secondary | ICD-10-CM | POA: Diagnosis not present

## 2018-03-13 DIAGNOSIS — H532 Diplopia: Secondary | ICD-10-CM | POA: Diagnosis not present

## 2018-03-13 DIAGNOSIS — H04123 Dry eye syndrome of bilateral lacrimal glands: Secondary | ICD-10-CM | POA: Diagnosis not present

## 2018-03-13 DIAGNOSIS — H26491 Other secondary cataract, right eye: Secondary | ICD-10-CM | POA: Diagnosis not present

## 2018-03-15 DIAGNOSIS — H26491 Other secondary cataract, right eye: Secondary | ICD-10-CM | POA: Diagnosis not present

## 2018-03-19 ENCOUNTER — Other Ambulatory Visit: Payer: Self-pay | Admitting: Family Medicine

## 2018-03-19 DIAGNOSIS — R58 Hemorrhage, not elsewhere classified: Secondary | ICD-10-CM | POA: Diagnosis not present

## 2018-03-19 DIAGNOSIS — N281 Cyst of kidney, acquired: Secondary | ICD-10-CM | POA: Diagnosis not present

## 2018-03-19 DIAGNOSIS — R109 Unspecified abdominal pain: Secondary | ICD-10-CM | POA: Diagnosis not present

## 2018-03-20 DIAGNOSIS — N3281 Overactive bladder: Secondary | ICD-10-CM | POA: Diagnosis not present

## 2018-03-20 DIAGNOSIS — M069 Rheumatoid arthritis, unspecified: Secondary | ICD-10-CM | POA: Diagnosis not present

## 2018-03-20 DIAGNOSIS — J209 Acute bronchitis, unspecified: Secondary | ICD-10-CM | POA: Diagnosis not present

## 2018-03-30 DIAGNOSIS — Z20828 Contact with and (suspected) exposure to other viral communicable diseases: Secondary | ICD-10-CM | POA: Diagnosis not present

## 2018-03-30 DIAGNOSIS — R05 Cough: Secondary | ICD-10-CM | POA: Diagnosis not present

## 2018-03-31 DIAGNOSIS — K289 Gastrojejunal ulcer, unspecified as acute or chronic, without hemorrhage or perforation: Secondary | ICD-10-CM | POA: Diagnosis not present

## 2018-03-31 DIAGNOSIS — Z79899 Other long term (current) drug therapy: Secondary | ICD-10-CM | POA: Diagnosis not present

## 2018-03-31 DIAGNOSIS — R0602 Shortness of breath: Secondary | ICD-10-CM | POA: Diagnosis not present

## 2018-03-31 DIAGNOSIS — M1990 Unspecified osteoarthritis, unspecified site: Secondary | ICD-10-CM | POA: Diagnosis not present

## 2018-03-31 DIAGNOSIS — R109 Unspecified abdominal pain: Secondary | ICD-10-CM | POA: Diagnosis not present

## 2018-03-31 DIAGNOSIS — Z8249 Family history of ischemic heart disease and other diseases of the circulatory system: Secondary | ICD-10-CM | POA: Diagnosis not present

## 2018-03-31 DIAGNOSIS — E860 Dehydration: Secondary | ICD-10-CM | POA: Diagnosis not present

## 2018-03-31 DIAGNOSIS — Z9049 Acquired absence of other specified parts of digestive tract: Secondary | ICD-10-CM | POA: Diagnosis not present

## 2018-03-31 DIAGNOSIS — K219 Gastro-esophageal reflux disease without esophagitis: Secondary | ICD-10-CM | POA: Diagnosis not present

## 2018-03-31 DIAGNOSIS — I10 Essential (primary) hypertension: Secondary | ICD-10-CM | POA: Diagnosis not present

## 2018-03-31 DIAGNOSIS — R1012 Left upper quadrant pain: Secondary | ICD-10-CM | POA: Diagnosis not present

## 2018-03-31 DIAGNOSIS — R110 Nausea: Secondary | ICD-10-CM | POA: Diagnosis not present

## 2018-03-31 DIAGNOSIS — R531 Weakness: Secondary | ICD-10-CM | POA: Diagnosis not present

## 2018-03-31 DIAGNOSIS — E162 Hypoglycemia, unspecified: Secondary | ICD-10-CM | POA: Diagnosis not present

## 2018-03-31 DIAGNOSIS — M069 Rheumatoid arthritis, unspecified: Secondary | ICD-10-CM | POA: Diagnosis not present

## 2018-03-31 DIAGNOSIS — R630 Anorexia: Secondary | ICD-10-CM | POA: Diagnosis not present

## 2018-03-31 DIAGNOSIS — I4891 Unspecified atrial fibrillation: Secondary | ICD-10-CM | POA: Diagnosis not present

## 2018-03-31 DIAGNOSIS — Z8379 Family history of other diseases of the digestive system: Secondary | ICD-10-CM | POA: Diagnosis not present

## 2018-03-31 DIAGNOSIS — Z950 Presence of cardiac pacemaker: Secondary | ICD-10-CM | POA: Diagnosis not present

## 2018-03-31 DIAGNOSIS — Z9884 Bariatric surgery status: Secondary | ICD-10-CM | POA: Diagnosis not present

## 2018-03-31 DIAGNOSIS — R05 Cough: Secondary | ICD-10-CM | POA: Diagnosis not present

## 2018-04-01 DIAGNOSIS — I10 Essential (primary) hypertension: Secondary | ICD-10-CM | POA: Diagnosis not present

## 2018-04-01 DIAGNOSIS — K289 Gastrojejunal ulcer, unspecified as acute or chronic, without hemorrhage or perforation: Secondary | ICD-10-CM | POA: Diagnosis not present

## 2018-04-01 DIAGNOSIS — M1990 Unspecified osteoarthritis, unspecified site: Secondary | ICD-10-CM | POA: Diagnosis not present

## 2018-04-01 DIAGNOSIS — I4891 Unspecified atrial fibrillation: Secondary | ICD-10-CM | POA: Diagnosis not present

## 2018-04-01 DIAGNOSIS — R109 Unspecified abdominal pain: Secondary | ICD-10-CM | POA: Diagnosis not present

## 2018-04-02 DIAGNOSIS — I4891 Unspecified atrial fibrillation: Secondary | ICD-10-CM | POA: Diagnosis not present

## 2018-04-02 DIAGNOSIS — R109 Unspecified abdominal pain: Secondary | ICD-10-CM | POA: Diagnosis not present

## 2018-04-02 DIAGNOSIS — K289 Gastrojejunal ulcer, unspecified as acute or chronic, without hemorrhage or perforation: Secondary | ICD-10-CM | POA: Diagnosis not present

## 2018-04-02 DIAGNOSIS — M1990 Unspecified osteoarthritis, unspecified site: Secondary | ICD-10-CM | POA: Diagnosis not present

## 2018-04-02 DIAGNOSIS — I10 Essential (primary) hypertension: Secondary | ICD-10-CM | POA: Diagnosis not present

## 2018-04-02 DIAGNOSIS — K259 Gastric ulcer, unspecified as acute or chronic, without hemorrhage or perforation: Secondary | ICD-10-CM | POA: Diagnosis not present

## 2018-04-09 DIAGNOSIS — K5732 Diverticulitis of large intestine without perforation or abscess without bleeding: Secondary | ICD-10-CM | POA: Diagnosis not present

## 2018-04-09 DIAGNOSIS — K2970 Gastritis, unspecified, without bleeding: Secondary | ICD-10-CM | POA: Diagnosis not present

## 2018-04-10 DIAGNOSIS — I714 Abdominal aortic aneurysm, without rupture: Secondary | ICD-10-CM | POA: Diagnosis not present

## 2018-04-10 DIAGNOSIS — I517 Cardiomegaly: Secondary | ICD-10-CM | POA: Diagnosis not present

## 2018-04-10 DIAGNOSIS — K449 Diaphragmatic hernia without obstruction or gangrene: Secondary | ICD-10-CM | POA: Diagnosis not present

## 2018-04-12 DIAGNOSIS — R112 Nausea with vomiting, unspecified: Secondary | ICD-10-CM | POA: Diagnosis not present

## 2018-04-12 DIAGNOSIS — R109 Unspecified abdominal pain: Secondary | ICD-10-CM | POA: Diagnosis not present

## 2018-04-12 DIAGNOSIS — Z8719 Personal history of other diseases of the digestive system: Secondary | ICD-10-CM | POA: Diagnosis not present

## 2018-04-12 DIAGNOSIS — K219 Gastro-esophageal reflux disease without esophagitis: Secondary | ICD-10-CM | POA: Diagnosis not present

## 2018-04-12 DIAGNOSIS — K253 Acute gastric ulcer without hemorrhage or perforation: Secondary | ICD-10-CM | POA: Diagnosis not present

## 2018-04-12 DIAGNOSIS — R194 Change in bowel habit: Secondary | ICD-10-CM | POA: Diagnosis not present

## 2018-04-12 DIAGNOSIS — K626 Ulcer of anus and rectum: Secondary | ICD-10-CM | POA: Diagnosis not present

## 2018-04-13 DIAGNOSIS — R109 Unspecified abdominal pain: Secondary | ICD-10-CM | POA: Diagnosis not present

## 2018-04-13 DIAGNOSIS — I4891 Unspecified atrial fibrillation: Secondary | ICD-10-CM | POA: Diagnosis present

## 2018-04-13 DIAGNOSIS — I252 Old myocardial infarction: Secondary | ICD-10-CM | POA: Diagnosis not present

## 2018-04-13 DIAGNOSIS — I5022 Chronic systolic (congestive) heart failure: Secondary | ICD-10-CM | POA: Diagnosis present

## 2018-04-13 DIAGNOSIS — D72829 Elevated white blood cell count, unspecified: Secondary | ICD-10-CM | POA: Diagnosis not present

## 2018-04-13 DIAGNOSIS — K5732 Diverticulitis of large intestine without perforation or abscess without bleeding: Secondary | ICD-10-CM | POA: Diagnosis present

## 2018-04-13 DIAGNOSIS — I255 Ischemic cardiomyopathy: Secondary | ICD-10-CM | POA: Diagnosis present

## 2018-04-13 DIAGNOSIS — Z95818 Presence of other cardiac implants and grafts: Secondary | ICD-10-CM | POA: Diagnosis not present

## 2018-04-13 DIAGNOSIS — K5740 Diverticulitis of both small and large intestine with perforation and abscess without bleeding: Secondary | ICD-10-CM | POA: Diagnosis not present

## 2018-04-13 DIAGNOSIS — R1032 Left lower quadrant pain: Secondary | ICD-10-CM | POA: Diagnosis not present

## 2018-04-13 DIAGNOSIS — E876 Hypokalemia: Secondary | ICD-10-CM | POA: Diagnosis not present

## 2018-04-13 DIAGNOSIS — I340 Nonrheumatic mitral (valve) insufficiency: Secondary | ICD-10-CM | POA: Diagnosis present

## 2018-04-13 DIAGNOSIS — I110 Hypertensive heart disease with heart failure: Secondary | ICD-10-CM | POA: Diagnosis present

## 2018-04-13 DIAGNOSIS — I2510 Atherosclerotic heart disease of native coronary artery without angina pectoris: Secondary | ICD-10-CM | POA: Diagnosis present

## 2018-04-13 DIAGNOSIS — K5900 Constipation, unspecified: Secondary | ICD-10-CM | POA: Diagnosis not present

## 2018-04-13 DIAGNOSIS — E785 Hyperlipidemia, unspecified: Secondary | ICD-10-CM | POA: Diagnosis present

## 2018-05-05 ENCOUNTER — Emergency Department (HOSPITAL_COMMUNITY): Payer: Medicare Other

## 2018-05-05 ENCOUNTER — Inpatient Hospital Stay: Payer: Self-pay

## 2018-05-05 ENCOUNTER — Other Ambulatory Visit: Payer: Self-pay

## 2018-05-05 ENCOUNTER — Inpatient Hospital Stay (HOSPITAL_COMMUNITY): Payer: Medicare Other

## 2018-05-05 ENCOUNTER — Inpatient Hospital Stay (HOSPITAL_COMMUNITY)
Admission: EM | Admit: 2018-05-05 | Discharge: 2018-05-14 | DRG: 378 | Disposition: A | Discharge status: Skilled Nursing Facility | Payer: Medicare Other | Attending: Family Medicine | Admitting: Family Medicine

## 2018-05-05 ENCOUNTER — Encounter (HOSPITAL_COMMUNITY): Payer: Self-pay | Admitting: Emergency Medicine

## 2018-05-05 DIAGNOSIS — D638 Anemia in other chronic diseases classified elsewhere: Secondary | ICD-10-CM | POA: Diagnosis present

## 2018-05-05 DIAGNOSIS — K449 Diaphragmatic hernia without obstruction or gangrene: Secondary | ICD-10-CM | POA: Diagnosis present

## 2018-05-05 DIAGNOSIS — K289 Gastrojejunal ulcer, unspecified as acute or chronic, without hemorrhage or perforation: Secondary | ICD-10-CM

## 2018-05-05 DIAGNOSIS — D500 Iron deficiency anemia secondary to blood loss (chronic): Secondary | ICD-10-CM | POA: Diagnosis not present

## 2018-05-05 DIAGNOSIS — R0602 Shortness of breath: Secondary | ICD-10-CM

## 2018-05-05 DIAGNOSIS — Z7901 Long term (current) use of anticoagulants: Secondary | ICD-10-CM

## 2018-05-05 DIAGNOSIS — Z950 Presence of cardiac pacemaker: Secondary | ICD-10-CM | POA: Diagnosis not present

## 2018-05-05 DIAGNOSIS — Z980 Intestinal bypass and anastomosis status: Secondary | ICD-10-CM | POA: Diagnosis not present

## 2018-05-05 DIAGNOSIS — K838 Other specified diseases of biliary tract: Secondary | ICD-10-CM | POA: Diagnosis not present

## 2018-05-05 DIAGNOSIS — K210 Gastro-esophageal reflux disease with esophagitis: Secondary | ICD-10-CM | POA: Diagnosis not present

## 2018-05-05 DIAGNOSIS — E860 Dehydration: Secondary | ICD-10-CM

## 2018-05-05 DIAGNOSIS — Z96611 Presence of right artificial shoulder joint: Secondary | ICD-10-CM | POA: Diagnosis present

## 2018-05-05 DIAGNOSIS — K253 Acute gastric ulcer without hemorrhage or perforation: Secondary | ICD-10-CM | POA: Diagnosis not present

## 2018-05-05 DIAGNOSIS — J302 Other seasonal allergic rhinitis: Secondary | ICD-10-CM | POA: Diagnosis present

## 2018-05-05 DIAGNOSIS — I739 Peripheral vascular disease, unspecified: Secondary | ICD-10-CM | POA: Diagnosis present

## 2018-05-05 DIAGNOSIS — M810 Age-related osteoporosis without current pathological fracture: Secondary | ICD-10-CM | POA: Diagnosis present

## 2018-05-05 DIAGNOSIS — K589 Irritable bowel syndrome without diarrhea: Secondary | ICD-10-CM | POA: Diagnosis present

## 2018-05-05 DIAGNOSIS — R109 Unspecified abdominal pain: Secondary | ICD-10-CM | POA: Diagnosis not present

## 2018-05-05 DIAGNOSIS — E440 Moderate protein-calorie malnutrition: Secondary | ICD-10-CM | POA: Diagnosis present

## 2018-05-05 DIAGNOSIS — K5900 Constipation, unspecified: Secondary | ICD-10-CM | POA: Diagnosis present

## 2018-05-05 DIAGNOSIS — R1013 Epigastric pain: Secondary | ICD-10-CM | POA: Diagnosis not present

## 2018-05-05 DIAGNOSIS — Z9882 Breast implant status: Secondary | ICD-10-CM | POA: Diagnosis not present

## 2018-05-05 DIAGNOSIS — Z1159 Encounter for screening for other viral diseases: Secondary | ICD-10-CM

## 2018-05-05 DIAGNOSIS — K5730 Diverticulosis of large intestine without perforation or abscess without bleeding: Secondary | ICD-10-CM | POA: Diagnosis not present

## 2018-05-05 DIAGNOSIS — N3281 Overactive bladder: Secondary | ICD-10-CM | POA: Diagnosis present

## 2018-05-05 DIAGNOSIS — D508 Other iron deficiency anemias: Secondary | ICD-10-CM | POA: Diagnosis present

## 2018-05-05 DIAGNOSIS — E785 Hyperlipidemia, unspecified: Secondary | ICD-10-CM | POA: Diagnosis present

## 2018-05-05 DIAGNOSIS — I2510 Atherosclerotic heart disease of native coronary artery without angina pectoris: Secondary | ICD-10-CM | POA: Diagnosis present

## 2018-05-05 DIAGNOSIS — N390 Urinary tract infection, site not specified: Secondary | ICD-10-CM | POA: Diagnosis present

## 2018-05-05 DIAGNOSIS — K219 Gastro-esophageal reflux disease without esophagitis: Secondary | ICD-10-CM | POA: Diagnosis present

## 2018-05-05 DIAGNOSIS — Z96653 Presence of artificial knee joint, bilateral: Secondary | ICD-10-CM | POA: Diagnosis present

## 2018-05-05 DIAGNOSIS — G8929 Other chronic pain: Secondary | ICD-10-CM | POA: Diagnosis present

## 2018-05-05 DIAGNOSIS — I4891 Unspecified atrial fibrillation: Secondary | ICD-10-CM

## 2018-05-05 DIAGNOSIS — R195 Other fecal abnormalities: Secondary | ICD-10-CM | POA: Diagnosis not present

## 2018-05-05 DIAGNOSIS — Z79899 Other long term (current) drug therapy: Secondary | ICD-10-CM

## 2018-05-05 DIAGNOSIS — I5022 Chronic systolic (congestive) heart failure: Secondary | ICD-10-CM | POA: Diagnosis present

## 2018-05-05 DIAGNOSIS — Z885 Allergy status to narcotic agent status: Secondary | ICD-10-CM

## 2018-05-05 DIAGNOSIS — Z886 Allergy status to analgesic agent status: Secondary | ICD-10-CM

## 2018-05-05 DIAGNOSIS — M069 Rheumatoid arthritis, unspecified: Secondary | ICD-10-CM | POA: Diagnosis present

## 2018-05-05 DIAGNOSIS — D62 Acute posthemorrhagic anemia: Secondary | ICD-10-CM | POA: Diagnosis not present

## 2018-05-05 DIAGNOSIS — E46 Unspecified protein-calorie malnutrition: Secondary | ICD-10-CM | POA: Diagnosis not present

## 2018-05-05 DIAGNOSIS — M25512 Pain in left shoulder: Secondary | ICD-10-CM | POA: Diagnosis not present

## 2018-05-05 DIAGNOSIS — Z9114 Patient's other noncompliance with medication regimen: Secondary | ICD-10-CM

## 2018-05-05 DIAGNOSIS — M6281 Muscle weakness (generalized): Secondary | ICD-10-CM | POA: Diagnosis not present

## 2018-05-05 DIAGNOSIS — K284 Chronic or unspecified gastrojejunal ulcer with hemorrhage: Principal | ICD-10-CM | POA: Diagnosis present

## 2018-05-05 DIAGNOSIS — Z452 Encounter for adjustment and management of vascular access device: Secondary | ICD-10-CM

## 2018-05-05 DIAGNOSIS — R0902 Hypoxemia: Secondary | ICD-10-CM | POA: Diagnosis not present

## 2018-05-05 DIAGNOSIS — G4700 Insomnia, unspecified: Secondary | ICD-10-CM | POA: Diagnosis present

## 2018-05-05 DIAGNOSIS — A419 Sepsis, unspecified organism: Secondary | ICD-10-CM

## 2018-05-05 DIAGNOSIS — K5732 Diverticulitis of large intestine without perforation or abscess without bleeding: Secondary | ICD-10-CM | POA: Diagnosis present

## 2018-05-05 DIAGNOSIS — Z8711 Personal history of peptic ulcer disease: Secondary | ICD-10-CM

## 2018-05-05 DIAGNOSIS — Z95810 Presence of automatic (implantable) cardiac defibrillator: Secondary | ICD-10-CM | POA: Diagnosis not present

## 2018-05-05 DIAGNOSIS — I482 Chronic atrial fibrillation, unspecified: Secondary | ICD-10-CM | POA: Diagnosis present

## 2018-05-05 DIAGNOSIS — I252 Old myocardial infarction: Secondary | ICD-10-CM | POA: Diagnosis not present

## 2018-05-05 DIAGNOSIS — D649 Anemia, unspecified: Secondary | ICD-10-CM | POA: Diagnosis not present

## 2018-05-05 DIAGNOSIS — I495 Sick sinus syndrome: Secondary | ICD-10-CM | POA: Diagnosis present

## 2018-05-05 DIAGNOSIS — K283 Acute gastrojejunal ulcer without hemorrhage or perforation: Secondary | ICD-10-CM | POA: Diagnosis not present

## 2018-05-05 DIAGNOSIS — M109 Gout, unspecified: Secondary | ICD-10-CM | POA: Diagnosis present

## 2018-05-05 DIAGNOSIS — Z95818 Presence of other cardiac implants and grafts: Secondary | ICD-10-CM

## 2018-05-05 DIAGNOSIS — I4821 Permanent atrial fibrillation: Secondary | ICD-10-CM | POA: Diagnosis not present

## 2018-05-05 DIAGNOSIS — I959 Hypotension, unspecified: Secondary | ICD-10-CM | POA: Diagnosis not present

## 2018-05-05 DIAGNOSIS — K9189 Other postprocedural complications and disorders of digestive system: Secondary | ICD-10-CM | POA: Diagnosis not present

## 2018-05-05 DIAGNOSIS — S4992XA Unspecified injury of left shoulder and upper arm, initial encounter: Secondary | ICD-10-CM | POA: Diagnosis not present

## 2018-05-05 DIAGNOSIS — R1032 Left lower quadrant pain: Secondary | ICD-10-CM | POA: Diagnosis not present

## 2018-05-05 DIAGNOSIS — K5750 Diverticulosis of both small and large intestine without perforation or abscess without bleeding: Secondary | ICD-10-CM | POA: Diagnosis not present

## 2018-05-05 DIAGNOSIS — Z6824 Body mass index (BMI) 24.0-24.9, adult: Secondary | ICD-10-CM

## 2018-05-05 DIAGNOSIS — M25519 Pain in unspecified shoulder: Secondary | ICD-10-CM

## 2018-05-05 DIAGNOSIS — Z96612 Presence of left artificial shoulder joint: Secondary | ICD-10-CM | POA: Diagnosis present

## 2018-05-05 DIAGNOSIS — R1310 Dysphagia, unspecified: Secondary | ICD-10-CM | POA: Diagnosis not present

## 2018-05-05 DIAGNOSIS — Z9884 Bariatric surgery status: Secondary | ICD-10-CM

## 2018-05-05 DIAGNOSIS — Z8249 Family history of ischemic heart disease and other diseases of the circulatory system: Secondary | ICD-10-CM

## 2018-05-05 DIAGNOSIS — Z79891 Long term (current) use of opiate analgesic: Secondary | ICD-10-CM

## 2018-05-05 DIAGNOSIS — F419 Anxiety disorder, unspecified: Secondary | ICD-10-CM | POA: Diagnosis present

## 2018-05-05 DIAGNOSIS — K279 Peptic ulcer, site unspecified, unspecified as acute or chronic, without hemorrhage or perforation: Secondary | ICD-10-CM | POA: Diagnosis not present

## 2018-05-05 DIAGNOSIS — G629 Polyneuropathy, unspecified: Secondary | ICD-10-CM | POA: Diagnosis present

## 2018-05-05 DIAGNOSIS — Z9049 Acquired absence of other specified parts of digestive tract: Secondary | ICD-10-CM

## 2018-05-05 DIAGNOSIS — I10 Essential (primary) hypertension: Secondary | ICD-10-CM | POA: Diagnosis not present

## 2018-05-05 DIAGNOSIS — G2581 Restless legs syndrome: Secondary | ICD-10-CM | POA: Diagnosis present

## 2018-05-05 DIAGNOSIS — Z86718 Personal history of other venous thrombosis and embolism: Secondary | ICD-10-CM

## 2018-05-05 DIAGNOSIS — I714 Abdominal aortic aneurysm, without rupture: Secondary | ICD-10-CM | POA: Diagnosis present

## 2018-05-05 DIAGNOSIS — Z90710 Acquired absence of both cervix and uterus: Secondary | ICD-10-CM

## 2018-05-05 DIAGNOSIS — Z981 Arthrodesis status: Secondary | ICD-10-CM

## 2018-05-05 DIAGNOSIS — R1114 Bilious vomiting: Secondary | ICD-10-CM | POA: Diagnosis not present

## 2018-05-05 DIAGNOSIS — Z7401 Bed confinement status: Secondary | ICD-10-CM | POA: Diagnosis not present

## 2018-05-05 DIAGNOSIS — I517 Cardiomegaly: Secondary | ICD-10-CM | POA: Diagnosis not present

## 2018-05-05 DIAGNOSIS — K769 Liver disease, unspecified: Secondary | ICD-10-CM | POA: Diagnosis present

## 2018-05-05 DIAGNOSIS — M2550 Pain in unspecified joint: Secondary | ICD-10-CM | POA: Diagnosis not present

## 2018-05-05 HISTORY — DX: Unilateral primary osteoarthritis, right knee: M17.11

## 2018-05-05 HISTORY — DX: Parasomnia, unspecified: G47.50

## 2018-05-05 HISTORY — DX: Other postprocedural complications and disorders of digestive system: K91.89

## 2018-05-05 HISTORY — DX: Abdominal aortic aneurysm, without rupture: I71.4

## 2018-05-05 HISTORY — DX: Personal history of other diseases of the digestive system: Z87.19

## 2018-05-05 HISTORY — DX: Other chronic pain: G89.29

## 2018-05-05 HISTORY — DX: Old myocardial infarction: I25.2

## 2018-05-05 HISTORY — DX: Unspecified atrial fibrillation: I48.91

## 2018-05-05 HISTORY — DX: Chronic systolic (congestive) heart failure: I50.22

## 2018-05-05 HISTORY — DX: Impacted cerumen, unspecified ear: H61.20

## 2018-05-05 HISTORY — DX: Other specified symptoms and signs involving the digestive system and abdomen: R19.8

## 2018-05-05 HISTORY — DX: Other seasonal allergic rhinitis: J30.2

## 2018-05-05 HISTORY — DX: Other specified disorders of bladder: N32.89

## 2018-05-05 HISTORY — DX: Toxic effect of venom of bees, accidental (unintentional), initial encounter: T63.441A

## 2018-05-05 HISTORY — DX: Diplopia: H53.2

## 2018-05-05 HISTORY — DX: Other specified postprocedural states: Z98.890

## 2018-05-05 HISTORY — DX: Unilateral primary osteoarthritis, right hip: M16.11

## 2018-05-05 HISTORY — DX: Unspecified visual disturbance: H53.9

## 2018-05-05 HISTORY — DX: Liver disease, unspecified: K76.9

## 2018-05-05 HISTORY — DX: Unilateral primary osteoarthritis, left hip: M16.12

## 2018-05-05 HISTORY — DX: Unspecified abdominal pain: R10.9

## 2018-05-05 HISTORY — DX: Rheumatoid arthritis, unspecified: M06.9

## 2018-05-05 LAB — URINALYSIS, ROUTINE W REFLEX MICROSCOPIC
Bilirubin Urine: NEGATIVE
Glucose, UA: NEGATIVE mg/dL
Ketones, ur: 20 mg/dL — AB
Nitrite: NEGATIVE
Protein, ur: NEGATIVE mg/dL
Specific Gravity, Urine: 1.034 — ABNORMAL HIGH (ref 1.005–1.030)
WBC, UA: >50 WBC/hpf — ABNORMAL HIGH (ref 0–5)
pH: 5 (ref 5.0–8.0)

## 2018-05-05 LAB — CBC WITH DIFFERENTIAL/PLATELET
Abs Immature Granulocytes: 0.14 10*3/uL — ABNORMAL HIGH (ref 0.00–0.07)
Basophils Absolute: 0.1 10*3/uL (ref 0.0–0.1)
Basophils Relative: 0 %
Eosinophils Absolute: 0 10*3/uL (ref 0.0–0.5)
Eosinophils Relative: 0 %
HCT: 26.8 % — ABNORMAL LOW (ref 36.0–46.0)
Hemoglobin: 8.3 g/dL — ABNORMAL LOW (ref 12.0–15.0)
Immature Granulocytes: 1 %
Lymphocytes Relative: 6 %
Lymphs Abs: 1 10*3/uL (ref 0.7–4.0)
MCH: 29.7 pg (ref 26.0–34.0)
MCHC: 31 g/dL (ref 30.0–36.0)
MCV: 96.1 fL (ref 80.0–100.0)
Monocytes Absolute: 0.8 10*3/uL (ref 0.1–1.0)
Monocytes Relative: 5 %
Neutro Abs: 14.5 10*3/uL — ABNORMAL HIGH (ref 1.7–7.7)
Neutrophils Relative %: 88 %
Platelets: 860 10*3/uL — ABNORMAL HIGH (ref 150–400)
RBC: 2.79 MIL/uL — ABNORMAL LOW (ref 3.87–5.11)
RDW: 15.2 % (ref 11.5–15.5)
WBC: 16.4 10*3/uL — ABNORMAL HIGH (ref 4.0–10.5)
nRBC: 0.8 % — ABNORMAL HIGH (ref 0.0–0.2)

## 2018-05-05 LAB — LIPID PANEL
Cholesterol: 114 mg/dL (ref 0–200)
HDL: 31 mg/dL — ABNORMAL LOW (ref >40)
LDL Cholesterol: 58 mg/dL (ref 0–99)
Total CHOL/HDL Ratio: 3.7 RATIO
Triglycerides: 123 mg/dL (ref <150)
VLDL: 25 mg/dL (ref 0–40)

## 2018-05-05 LAB — CBC
HCT: 22.1 % — ABNORMAL LOW (ref 36.0–46.0)
Hemoglobin: 7 g/dL — ABNORMAL LOW (ref 12.0–15.0)
MCH: 29.3 pg (ref 26.0–34.0)
MCHC: 31.7 g/dL (ref 30.0–36.0)
MCV: 92.5 fL (ref 80.0–100.0)
Platelets: 681 10*3/uL — ABNORMAL HIGH (ref 150–400)
RBC: 2.39 MIL/uL — ABNORMAL LOW (ref 3.87–5.11)
RDW: 14.8 % (ref 11.5–15.5)
WBC: 15.1 10*3/uL — ABNORMAL HIGH (ref 4.0–10.5)
nRBC: 0.5 % — ABNORMAL HIGH (ref 0.0–0.2)

## 2018-05-05 LAB — COMPREHENSIVE METABOLIC PANEL
ALT: 70 U/L — ABNORMAL HIGH (ref 0–44)
AST: 29 U/L (ref 15–41)
Albumin: 2.8 g/dL — ABNORMAL LOW (ref 3.5–5.0)
Alkaline Phosphatase: 70 U/L (ref 38–126)
Anion gap: 17 — ABNORMAL HIGH (ref 5–15)
BUN: 25 mg/dL — ABNORMAL HIGH (ref 8–23)
CO2: 17 mmol/L — ABNORMAL LOW (ref 22–32)
Calcium: 9.1 mg/dL (ref 8.9–10.3)
Chloride: 103 mmol/L (ref 98–111)
Creatinine, Ser: 0.78 mg/dL (ref 0.44–1.00)
GFR calc Af Amer: >60 mL/min (ref >60)
GFR calc non Af Amer: >60 mL/min (ref >60)
Glucose, Bld: 141 mg/dL — ABNORMAL HIGH (ref 70–99)
Potassium: 3.4 mmol/L — ABNORMAL LOW (ref 3.5–5.1)
Sodium: 137 mmol/L (ref 135–145)
Total Bilirubin: 0.7 mg/dL (ref 0.3–1.2)
Total Protein: 6.5 g/dL (ref 6.5–8.1)

## 2018-05-05 LAB — LACTIC ACID, PLASMA
Lactic Acid, Venous: 1.1 mmol/L (ref 0.5–1.9)
Lactic Acid, Venous: 0.7 mmol/L (ref 0.5–1.9)

## 2018-05-05 LAB — IRON AND TIBC
Iron: 8 ug/dL — ABNORMAL LOW (ref 28–170)
Saturation Ratios: 3 % — ABNORMAL LOW (ref 10.4–31.8)
TIBC: 245 ug/dL — ABNORMAL LOW (ref 250–450)
UIBC: 237 ug/dL

## 2018-05-05 LAB — ACETAMINOPHEN LEVEL: Acetaminophen (Tylenol), Serum: <10 ug/mL — ABNORMAL LOW (ref 10–30)

## 2018-05-05 LAB — POC OCCULT BLOOD, ED: Fecal Occult Bld: POSITIVE — AB

## 2018-05-05 LAB — FERRITIN: Ferritin: 418 ng/mL — ABNORMAL HIGH (ref 11–307)

## 2018-05-05 LAB — LIPASE, BLOOD: Lipase: 27 U/L (ref 11–51)

## 2018-05-05 MED ORDER — FESOTERODINE FUMARATE ER 4 MG PO TB24
4.0000 mg | ORAL_TABLET | Freq: Every day | ORAL | Status: DC
  Administered 2018-05-06 – 2018-05-14 (×9): 4 mg via ORAL
  Filled 2018-05-05 (×10): qty 1

## 2018-05-05 MED ORDER — SODIUM CHLORIDE 0.9 % IV SOLN
INTRAVENOUS | Status: DC
  Administered 2018-05-05: 23:00:00 via INTRAVENOUS

## 2018-05-05 MED ORDER — MORPHINE SULFATE (PF) 2 MG/ML IV SOLN
2.0000 mg | INTRAVENOUS | Status: DC | PRN
  Administered 2018-05-05 – 2018-05-08 (×11): 2 mg via INTRAVENOUS
  Filled 2018-05-05 (×12): qty 1

## 2018-05-05 MED ORDER — SODIUM CHLORIDE 0.9 % IV BOLUS
500.0000 mL | Freq: Once | INTRAVENOUS | Status: AC
  Administered 2018-05-05: 500 mL via INTRAVENOUS

## 2018-05-05 MED ORDER — SODIUM CHLORIDE 0.9% FLUSH: 10.0000 mL | INTRAVENOUS | Status: DC | PRN

## 2018-05-05 MED ORDER — IOHEXOL 350 MG/ML SOLN
100.0000 mL | Freq: Once | INTRAVENOUS | Status: AC | PRN
  Administered 2018-05-05: 22:00:00 100 mL via INTRAVENOUS

## 2018-05-05 MED ORDER — SODIUM CHLORIDE 0.9 % IV SOLN: INTRAVENOUS | Status: DC

## 2018-05-05 MED ORDER — SUCRALFATE 1 G PO TABS
1.0000 g | ORAL_TABLET | Freq: Four times a day (QID) | ORAL | Status: DC
  Administered 2018-05-05 – 2018-05-07 (×5): 1 g via ORAL
  Filled 2018-05-05 (×5): qty 1

## 2018-05-05 MED ORDER — PIPERACILLIN-TAZOBACTAM 3.375 G IVPB: 3.3750 g | Freq: Three times a day (TID) | INTRAVENOUS | Status: DC

## 2018-05-05 MED ORDER — PIPERACILLIN-TAZOBACTAM 3.375 G IVPB 30 MIN
3.3750 g | Freq: Once | INTRAVENOUS | Status: DC
  Filled 2018-05-05: qty 50

## 2018-05-05 MED ORDER — FENTANYL CITRATE (PF) 100 MCG/2ML IJ SOLN
50.0000 ug | Freq: Once | INTRAMUSCULAR | Status: AC
  Administered 2018-05-05: 50 ug via INTRAVENOUS
  Filled 2018-05-05: qty 2

## 2018-05-05 MED ORDER — PIPERACILLIN-TAZOBACTAM 3.375 G IVPB
3.3750 g | Freq: Three times a day (TID) | INTRAVENOUS | Status: DC
  Administered 2018-05-05: 3.375 g via INTRAVENOUS
  Filled 2018-05-05: qty 50

## 2018-05-05 MED ORDER — ROPINIROLE HCL 1 MG PO TABS
1.0000 mg | ORAL_TABLET | Freq: Every day | ORAL | Status: DC
  Administered 2018-05-05 – 2018-05-13 (×9): 1 mg via ORAL
  Filled 2018-05-05 (×9): qty 1

## 2018-05-05 MED ORDER — SODIUM CHLORIDE 0.9% FLUSH
3.0000 mL | Freq: Two times a day (BID) | INTRAVENOUS | Status: DC
  Administered 2018-05-06 – 2018-05-08 (×4): 3 mL via INTRAVENOUS
  Administered 2018-05-10: 10:00:00 10 mL via INTRAVENOUS
  Administered 2018-05-11: 3 mL via INTRAVENOUS
  Administered 2018-05-11: 10 mL via INTRAVENOUS
  Administered 2018-05-12 – 2018-05-14 (×3): 3 mL via INTRAVENOUS

## 2018-05-05 MED ORDER — ROPINIROLE HCL 1 MG PO TABS
0.5000 mg | ORAL_TABLET | Freq: Every day | ORAL | Status: DC
  Administered 2018-05-07 – 2018-05-14 (×8): 0.5 mg via ORAL
  Filled 2018-05-05 (×10): qty 1

## 2018-05-05 MED ORDER — PANTOPRAZOLE SODIUM 40 MG IV SOLR
40.0000 mg | Freq: Once | INTRAVENOUS | Status: AC
  Administered 2018-05-05: 40 mg via INTRAVENOUS
  Filled 2018-05-05: qty 40

## 2018-05-05 MED ORDER — SODIUM CHLORIDE 0.9 % IV BOLUS: 500.0000 mL | Freq: Once | INTRAVENOUS | Status: DC

## 2018-05-05 MED ORDER — PANTOPRAZOLE SODIUM 40 MG PO TBEC
40.0000 mg | DELAYED_RELEASE_TABLET | Freq: Two times a day (BID) | ORAL | Status: DC
  Administered 2018-05-05 – 2018-05-06 (×2): 40 mg via ORAL
  Filled 2018-05-05 (×2): qty 1

## 2018-05-05 MED ORDER — METOPROLOL TARTRATE 50 MG PO TABS
50.0000 mg | ORAL_TABLET | Freq: Two times a day (BID) | ORAL | Status: DC
  Administered 2018-05-05 – 2018-05-07 (×4): 50 mg via ORAL
  Filled 2018-05-05 (×6): qty 1

## 2018-05-05 NOTE — Progress Notes (Addendum)
Pharmacy Antibiotic Note  Deanna Spencer is a 78 y.o. female admitted on 05/05/2018 with concerns for intra-abdominal infection.  Pharmacy has been consulted for zosyn dosing. On admission patient afebrile, elevated WBC of 16.4, stable Scr of 0.78.   Plan: Zosyn 3.375g IV q8h (4 hour infusion). (first dose to be administered after blood cultures are obtained) Monitor Scr, CBC, and clinical progression F/u C&S, LOT, and de-escalation plans  Height: 4\' 11"  (149.9 cm) Weight: 122 lb (55.3 kg) IBW/kg (Calculated) : 43.2  Temp (24hrs), Avg:98.3 F (36.8 C), Min:98.3 F (36.8 C), Max:98.3 F (36.8 C)  Recent Labs  Lab 05/05/18 1130 05/05/18 1326  WBC 16.4*  --   CREATININE  --  0.78    Estimated Creatinine Clearance: 45.3 mL/min (by C-G formula based on SCr of 0.78 mg/dL).    Allergies  Allergen Reactions  . Demerol [Meperidine] Anaphylaxis  . Nsaids     Patient has had MULTIPLE revisions of her gastrojejunostomy for marginal ulcers including recent perforation    Antimicrobials this admission: Zosyn 4/25 >>   Dose adjustments this admission: N/A  Microbiology results: 4/25 BCx:  4/25 UCx:    Thank you for allowing pharmacy to be a part of this patient's care.  Thank you for allowing pharmacy to be a part of this patient's care.  Leron Croak, PharmD PGY1 Pharmacy Resident Phone: [phone]  Please check AMION for all Deanna Spencer phone numbers 05/05/2018 5:57 PM

## 2018-05-05 NOTE — ED Provider Notes (Signed)
[facility] Provider Note   CSN: [id number] Arrival date & time: 05/05/18  1037    History   Chief Complaint Chief Complaint  Patient presents with  . Abdominal Pain  . Palpitations    HPI Deanna Spencer is a 78 y.o. female.     Patient is a 78 year old female who status post gastric bypass surgery that has been complicated by recurrent ulcers.  She is required multiple blood transfusions.  She states starting in November of last year she started having worsening pain in her lower abdomen and left side.  She was hospitalized in [place] in November and later this month.  She was discharged on April 8 for diverticulitis.  Repeat CT scan at that time showed resolution on discharge of inflammation.  She was continued on Cipro and Flagyl.  She states that she now has worsening pain across her lower abdomen and left side.  She denies any fevers.  She has a very poor appetite and has not been able to eat or drink.  She is had ongoing nausea.  She states she has not taken her medications in about 2 weeks.  She does have a history of atrial fibrillation status post Watchman device.  She is not on anticoagulants due to her recurrent GI bleeding.  She denies any known fevers.  No cough or cold symptoms.  She currently is back in [place] but has been living in a hotel because she does not want to stay with her daughter who she normally lives with due to the need for social distancing.  She checked out of the hotel room this morning to come to the emergency room.  She denies any blood in her stool but states she has not had a bowel movement in over a week.     Past Medical History:  Diagnosis Date  . AAA (abdominal aortic aneurysm) ([place])    3.5cm by CT 11/29/16  . Anemia   . Childhood asthma    connected to allergies  . Chronic lower back pain   . Diverticulosis   . Duodenal ulcer   . Dyspnea   . Dysrhythmia    a fib  . GERD (gastroesophageal  reflux disease)   . GI bleed    "have had both upper and lower GIB"  . Gout   . Heart attack ([place]) 2017  . Heart murmur   . History of blood transfusion 2017-2018   "related to bleeding ulcers; 47 units PRBC; 2U plasma" (11/23/2016)  . History of hiatal hernia   . Hx of blood clots    in heart  . IBS (irritable bowel syndrome)   . Neuropathy   . Osteoarthritis   . Osteoporosis   . Peripheral neuropathy   . 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 Presence of Watchman left atrial appendage closure device   . Raynaud disease   . Rheumatoid arteritis ([place])   . SSS (sick sinus syndrome) ([place])    a. s/p Medtronic PPM placement in 2017  . Stomach ulcer     Patient Active Problem List   Diagnosis Date Noted  . Diplopia 09/27/2017  . Rheumatoid arthritis involving both hands ([place]) 09/27/2017  . Seasonal allergies 08/07/2017  . Vision changes 08/07/2017  . Liver lesion 08/07/2017  . Cerumen impaction 06/28/2017  . Bee sting 06/28/2017  . Primary osteoarthritis of right knee 06/02/2017  . Osteoarthritis of right knee 05/30/2017  .  Primary osteoarthritis of left hip 02/24/2017  . AAA (abdominal aortic aneurysm) without rupture ([place]) 12/21/2016  . Chronic systolic heart failure ([place]) 12/14/2016  . S/P exploratory laparotomy 11/22/2016  . Perforated viscus 11/22/2016  . Atrial fibrillation ([place]) 11/22/2016  . Peripheral neuropathy 11/22/2016  . GERD (gastroesophageal reflux disease) 11/22/2016  . Other chronic pain 11/18/2016  . 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 History of heart attack 11/18/2016  . Parasomnia 11/18/2016  . Need for immunization against influenza 11/18/2016  . Encounter to establish care 11/18/2016  . History of duodenal ulcer 11/18/2016    Past Surgical History:  Procedure Laterality Date  . ABDOMINAL HYSTERECTOMY    . APPENDECTOMY    . ARTHROPLASTY Right 06/02/2017    RIGHT PATELLA-FEMORAL ARTHROPLASTY, with placement of a cemented 29 mm Stryker asymmetric triathlon patellar button, revision of the tibial polyethylene bearing.  Marland Kitchen CARPAL TUNNEL RELEASE Bilateral   . CATARACT EXTRACTION W/ INTRAOCULAR LENS  IMPLANT, BILATERAL Bilateral   . CHOLECYSTECTOMY OPEN    . DILATION AND CURETTAGE OF UTERUS    . FOOT FUSION Right   . INGUINAL HERNIA REPAIR Right X 2  . INSERT / REPLACE / REMOVE PACEMAKER  2017   medtronic  . JOINT REPLACEMENT    . LAPAROTOMY N/A 11/21/2016   Procedure: EXPLORATORY LAPAROTOMY, REVISION OF  GASTROJEJUNOSTOMY, ESOPHAGOGASTROSCOPY;  Surgeon: Clovis Riley, MD;  Location: [facility];  Service: General;  Laterality: N/A;  . LEFT ATRIAL APPENDAGE OCCLUSION  2018   placed  via groin  . LUMBAR FUSION  X 2  . PATELLA-FEMORAL ARTHROPLASTY Right 06/02/2017   Procedure: RIGHT PATELLA-FEMORAL ARTHROPLASTY;  Surgeon: Frederik Pear, MD;  Location: [facility];  Service: Orthopedics;  Laterality: Right;  . REVERSE SHOULDER ARTHROPLASTY Left    reverse shoulder replacement  . ROUX-EN-Y GASTRIC BYPASS  2005  . ROUX-EN-Y PROCEDURE  20017 X 2   "revisions"  . TONSILLECTOMY AND ADENOIDECTOMY    . TOTAL HIP ARTHROPLASTY Right 02/24/2017   Procedure: TOTAL HIP ARTHROPLASTY;  Surgeon: Earlie Server, MD;  Location: [facility];  Service: Orthopedics;  Laterality: Right;  . TOTAL KNEE ARTHROPLASTY Bilateral   . TOTAL SHOULDER ARTHROPLASTY Bilateral   . TUBAL LIGATION       OB History   No obstetric history on file.      Home Medications    Prior to Admission medications   Medication Sig Start Date End Date Taking? Authorizing Provider  acetaminophen (TYLENOL) 500 MG tablet Take 1,000 mg every 6 (six) hours as needed by mouth (pain).     [provider]  apixaban (ELIQUIS) 2.5 MG TABS tablet Take 1 tablet (2.5 mg total) by mouth 2 (two) times daily. 06/02/17   Leighton Parody, PA-C  bismuth subsalicylate (PEPTO-BISMOL) 262 MG chewable tablet Chew  2 tablets (524 mg total) by mouth as needed. Can take up to 8 tablets total per day. Wait one hour between doses. 08/22/17   Nuala Alpha, DO  colchicine 0.6 MG tablet Take 1 tablet (0.6 mg total) by mouth 2 (two) times daily as needed (gout attacks). 04/18/17   Nuala Alpha, DO  diclofenac (VOLTAREN) 75 MG EC tablet TAKE 1 TABLET(75 MG) BY MOUTH TWICE DAILY 09/25/17   Nuala Alpha, DO  ferrous sulfate 325 (65 FE) MG tablet You can resume this in a couple weeks after your feeling better. Patient taking differently: Take 325 mg by mouth daily.  11/30/16   Earnstine Regal, PA-C  gabapentin (  NEURONTIN) 300 MG capsule TAKE 2 CAPSULEs(600 MG) BY MOUTH THREE TIMES DAILY 11/16/17   Nuala Alpha, DO  loratadine (CLARITIN) 10 MG tablet Take 1 tablet (10 mg total) by mouth daily. 07/31/17   Nuala Alpha, DO  Melatonin 1 MG CAPS Take 1 capsule (1 mg total) by mouth at bedtime. 09/22/17   Nuala Alpha, DO  oxyCODONE-acetaminophen (PERCOCET/ROXICET) 5-325 MG tablet Take 1 tablet by mouth every 4 (four) hours as needed for severe pain. 06/02/17   Leighton Parody, PA-C  rOPINIRole (REQUIP) 0.5 MG tablet TAKE 1 TABLET BY MOUTH THREE TIMES DAILY 08/21/17   Nuala Alpha, DO  rOPINIRole (REQUIP) 0.5 MG tablet TAKE 1 TABLET BY MOUTH THREE TIMES DAILY 03/19/18   Lockamy, Timothy, DO  sucralfate (CARAFATE) 1 g tablet TAKE 1 TABLET(1 GRAM) BY MOUTH FOUR TIMES DAILY AT BEDTIME WITH MEALS 03/05/18   Lockamy, Timothy, DO  tiZANidine (ZANAFLEX) 2 MG tablet Take 1 tablet (2 mg total) by mouth every 6 (six) hours as needed. 12/14/17   Lockamy, Timothy, DO  TOVIAZ 4 MG TB24 tablet TAKE 1 TABLET(4 MG) BY MOUTH DAILY 10/19/17   Lockamy, Timothy, DO  TOVIAZ 4 MG TB24 tablet TAKE 1 TABLET(4 MG) BY MOUTH DAILY 12/13/17   Lockamy, Timothy, DO  traMADol (ULTRAM) 50 MG tablet Take 50 mg by mouth every 6 (six) hours as needed.    [provider]    Family History Family History  Problem Relation Age of Onset   . Heart disease Mother   . Liver disease Father   . Ulcers Father   . Prostate cancer Brother   . Colon cancer Neg Hx     Social History Social History   Tobacco Use  . Smoking status: Never Smoker  . Smokeless tobacco: Never Used  Substance Use Topics  . Alcohol use: No    Frequency: Never    Comment: very rare  . Drug use: No     Allergies   Demerol [meperidine] and Nsaids   Review of Systems Review of Systems  Constitutional: Positive for appetite change and fatigue. Negative for chills, diaphoresis and fever.  HENT: Negative for congestion, rhinorrhea and sneezing.   Eyes: Negative.   Respiratory: Negative for cough, chest tightness and shortness of breath.   Cardiovascular: Negative for chest pain and leg swelling.  Gastrointestinal: Positive for abdominal pain and nausea. Negative for blood in stool, diarrhea and vomiting.  Genitourinary: Negative for difficulty urinating, flank pain, frequency and hematuria.  Musculoskeletal: Negative for arthralgias and back pain.  Skin: Negative for rash.  Neurological: Negative for dizziness, speech difficulty, weakness, numbness and headaches.     Physical Exam Updated Vital Signs BP 122/76   Pulse (!) 123   Temp 98.3 F (36.8 C) (Oral)   Resp 17   Ht 4\' 11"  (1.499 m)   Wt 55.3 kg   SpO2 99%   BMI 24.64 kg/m   Physical Exam Constitutional:      Appearance: She is well-developed. She is ill-appearing.  HENT:     Head: Normocephalic and atraumatic.  Eyes:     Pupils: Pupils are equal, round, and reactive to light.  Neck:     Musculoskeletal: Normal range of motion and neck supple.  Cardiovascular:     Rate and Rhythm: Normal rate and regular rhythm.     Heart sounds: Normal heart sounds.  Pulmonary:     Effort: Pulmonary effort is normal. No respiratory distress.     Breath sounds: Normal breath  sounds. No wheezing or rales.  Chest:     Chest wall: No tenderness.  Abdominal:     General: Bowel sounds  are normal.     Palpations: Abdomen is soft.     Tenderness: There is abdominal tenderness in the periumbilical area, suprapubic area and left lower quadrant. There is no guarding or rebound.  Musculoskeletal: Normal range of motion.  Lymphadenopathy:     Cervical: No cervical adenopathy.  Skin:    General: Skin is warm and dry.     Findings: No rash.  Neurological:     Mental Status: She is alert and oriented to person, place, and time.      ED Treatments / Results  Labs (all labs ordered are listed, but only abnormal results are displayed) Labs Reviewed  CBC WITH DIFFERENTIAL/PLATELET - Abnormal; Notable for the following components:      Result Value   WBC 16.4 (*)    RBC 2.79 (*)    Hemoglobin 8.3 (*)    HCT 26.8 (*)    Platelets 860 (*)    nRBC 0.8 (*)    Neutro Abs 14.5 (*)    Abs Immature Granulocytes 0.14 (*)    All other components within normal limits  COMPREHENSIVE METABOLIC PANEL - Abnormal; Notable for the following components:   Potassium 3.4 (*)    CO2 17 (*)    Glucose, Bld 141 (*)    BUN 25 (*)    Albumin 2.8 (*)    ALT 70 (*)    Anion gap 17 (*)    All other components within normal limits  POC OCCULT BLOOD, ED - Abnormal; Notable for the following components:   Fecal Occult Bld POSITIVE (*)    All other components within normal limits  LIPASE, BLOOD  URINALYSIS, ROUTINE W REFLEX MICROSCOPIC  TYPE AND SCREEN    EKG EKG Interpretation  Date/Time:  Saturday May 05 2018 10:48:14 EDT Ventricular Rate:  144 PR Interval:    QRS Duration: 119 QT Interval:  297 QTC Calculation: 460 R Axis:   70 Text Interpretation:  Atrial fibrillation Right bundle branch block Probable inferior infarct, age indeterminate Confirmed by Malvin Johns [phone]) on 05/05/2018 10:57:57 AM   Radiology Ct Abdomen Pelvis Wo Contrast  Result Date: 05/05/2018 CLINICAL DATA:  Chronic lower abdominal pain. EXAM: CT ABDOMEN AND PELVIS WITHOUT CONTRAST TECHNIQUE:  Multidetector CT imaging of the abdomen and pelvis was performed following the standard protocol without IV contrast. COMPARISON:  CT of the abdomen and pelvis with contrast on 08/08/2017 FINDINGS: Lower chest: No acute abnormality. Hepatobiliary: Stable dilatation of bile ducts after prior cholecystectomy. Unenhanced appearance of the liver is unremarkable. Pancreas: Unremarkable. No pancreatic ductal dilatation or surrounding inflammatory changes. Spleen: Normal in size without focal abnormality. Adrenals/Urinary Tract: No adrenal masses. Stable cortical scarring of both kidneys. No hydronephrosis or renal calculi identified. The bladder appears unremarkable. Stomach/Bowel: Stable appearance of prior gastric bypass. No evidence of bowel obstruction, ileus or free intraperitoneal air. Stable diverticulosis of the sigmoid colon without evidence by CT of acute diverticulitis. Vascular/Lymphatic: Stable mild aneurysmal dilatation of the infrarenal abdominal aorta measuring 3.3 cm in greatest diameter. No evidence of rupture. No enlarged lymph nodes identified. Reproductive: Status post hysterectomy. No adnexal masses. Other: No abdominal wall hernia or abnormality. No abdominopelvic ascites. Musculoskeletal: Stable appearance of spine after prior posterior lumbar fusion at L3-4. No fractures or bony lesions identified. IMPRESSION: 1. No acute findings in the abdomen or pelvis. 2. Stable dilatation of  bile ducts after prior cholecystectomy. 3. Stable diverticulosis of the sigmoid colon without evidence of acute diverticulitis. 4. Stable mild aneurysmal disease of the abdominal aorta measuring 3.3 cm. 5. Stable appearance of prior gastric bypass. Electronically Signed   By: Aletta Edouard M.D.   On: 05/05/2018 15:41    Procedures Procedures (including critical care time)  Medications Ordered in ED Medications  pantoprazole (PROTONIX) injection 40 mg (has no administration in time range)  fentaNYL (SUBLIMAZE)  injection 50 mcg (50 mcg Intravenous Given 05/05/18 1318)  sodium chloride 0.9 % bolus 500 mL (500 mLs Intravenous New Bag/Given 05/05/18 1318)     Initial Impression / Assessment and Plan / ED Course  I have reviewed the triage vital signs and the nursing notes.  Pertinent labs & imaging results that were available during my care of the patient were reviewed by me and considered in my medical decision making (see chart for details).        Patient is a 78 year old female who presents with worsening of some recurrent abdominal pain.  CT scan shows no evidence of acute abnormality.  She does have a drop in her hemoglobin from around 10 on her recent hospitalization to 8 today.  Her Hemoccult is positive for blood.  She is mildly tachycardic and is in atrial fibrillation with RVR.  She has not taken her beta-blockers which could be contributing to this.  Her blood pressure is stable.  Will consult family practice service for admission.    Final Clinical Impressions(s) / ED Diagnoses   Final diagnoses:  PUD (peptic ulcer disease)  Other iron deficiency anemia  Atrial fibrillation with rapid ventricular response [facility])    ED Discharge Orders    None       Malvin Johns, MD 05/05/18 1552

## 2018-05-05 NOTE — Progress Notes (Signed)
Spoke with Dr Fransico Setters re PICC placed immediately after blood cultures drawn.  Orders to go ahead and place PICC regardless of Blood cultures due to emergent need for CTA and no other attempts successful other than 24G PIV.

## 2018-05-05 NOTE — Progress Notes (Addendum)
Notified MD Kris Mouton of patient HR sustained 130's (A fib with RVR) at approximately 2200. Informed to administer scheduled metoprolol and continue to monitor HR and BP.  Patient describe some SOB, A&Ox4, stated has felt feeling before. No distress.   Informed transport Broadus John at 2048 to take patient to xray not CT. Spoke with [place] in [place] to confirm patient will be going to Xray first. Xray to confirm PICC placement before use.   Notified by lab [place] at [facility] pt. Hgb 6.8. Paged MD at [facility]. 1 unit RBC's ordered.

## 2018-05-05 NOTE — ED Triage Notes (Signed)
Pt arrives to ED from home with complaints of lower abdominal pain since November. Pt states the pain has ben the same since with no relief. 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 Pt also reports heart palpitations since Good Friday when she traveled to [facility] from [facility]. Pt has been living in a hotel until this week and now lives with her daughter.

## 2018-05-05 NOTE — Progress Notes (Signed)
Peripherally Inserted Central Catheter/Midline Placement  The IV Nurse has discussed with the patient and/or persons authorized to consent for the patient, the purpose of this procedure and the potential benefits and risks involved with this procedure.  The benefits include less needle sticks, lab draws from the catheter, and the patient may be discharged home with the catheter. Risks include, but not limited to, infection, bleeding, blood clot (thrombus formation), and puncture of an artery; nerve damage and irregular heartbeat and possibility to perform a PICC exchange if needed/ordered by physician.  Alternatives to this procedure were also discussed.  Bard Power PICC patient education guide, fact sheet on infection prevention and patient information card has been provided to patient /or left at bedside.    PICC/Midline Placement Documentation  PICC Single Lumen 05/05/18 PICC Right Brachial 33 cm 0 cm (Active)  Indication for Insertion or Continuance of Line Poor Vasculature-patient has had multiple peripheral attempts or PIVs lasting less than 24 hours 05/05/2018  7:28 PM  Exposed Catheter (cm) 0 cm 05/05/2018  7:28 PM  Site Assessment Clean;Dry;Intact 05/05/2018  7:28 PM  Line Status Saline locked;Flushed;Blood return noted 05/05/2018  7:28 PM  Dressing Type Transparent 05/05/2018  7:28 PM  Dressing Status Clean;Dry;Intact;Antimicrobial disc in place 05/05/2018  7:28 PM  Line Care Connections checked and tightened 05/05/2018  7:28 PM  Line Adjustment (NICU/IV Team Only) No 05/05/2018  7:28 PM  Dressing Intervention New dressing 05/05/2018  7:28 PM  Dressing Change Due 05/12/18 05/05/2018  7:28 PM       Deanna Spencer 05/05/2018, 7:30 PM

## 2018-05-05 NOTE — Consult Note (Signed)
Reason for Consult: Abdominal pain Referring Physician: Dorris Singh, MD  Deanna Spencer is an 78 y.o. female.  HPI:  Pt is a 78 yo F who presented to the ED with continued abdominal pain and 2 weeks of worsening nausea and intolerance of PO intake.  Of note, the patient is status post gastric bypass surgery with multiple revisions and ulcer at 1 of her anastomoses.  One of these revisions was by Dr. Kae Heller and Dr. Lucia Gaskins 11/2016.  She also has been told she has a history of diverticulitis.  However, she takes the antibiotics and gets a little bit better, but over a few days to weeks at home, she gets worse again.  She reports pain since November.  She was admitted to the hospital in [place] in November, February, and April.  She was found to have a ulcer in February or April.  She thinks it was April, but the paperwork she has with her suggests that it was at her previous admission in February.  She states her pain is almost always in the left lower quadrant.  She describes the pain as severe.  She has pain and nausea to the point that she has not eaten "hardly anything" in the last 2 weeks.  She is very frustrated as she does not feel like she has a good explanation for her pain.  Her testing is unrevealing.  She does have diverticuli that are present on CT, but no evidence of inflammation.  This is true of our CT here as well as a CT performed in April in [place].  She does have this report with her.  She denies fever/chills.  Colonoscopy was several years ago.  She is very concerned that she has a hidden cancer.  The pain is better lying on her side.  She has lost weight, but can't say how much.    The patient goes between [place] and here as her daughter lives here.  She was living with her but went to a hotel in order to social distance.    Past Medical History:  Diagnosis Date  . AAA (abdominal aortic aneurysm) ([place])    3.5cm by CT 11/29/16  . Anemia   . Childhood asthma    connected to  allergies  . Chronic lower back pain   . Diverticulosis   . Duodenal ulcer   . Dyspnea   . Dysrhythmia    a fib  . GERD (gastroesophageal reflux disease)   . GI bleed    "have had both upper and lower GIB"  . Gout   . Heart attack ([place]) 2017  . Heart murmur   . History of blood transfusion 2017-2018   "related to bleeding ulcers; 47 units PRBC; 2U plasma" (11/23/2016)  . History of hiatal hernia   . Hx of blood clots    in heart  . IBS (irritable bowel syndrome)   . Neuropathy   . Osteoarthritis   . Osteoporosis   . Peripheral neuropathy   . 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 Presence of Watchman left atrial appendage closure device   . Raynaud disease   . Rheumatoid arteritis ([place])   . SSS (sick sinus syndrome) ([place])    a. s/p Medtronic PPM placement in 2017  . Stomach ulcer     Past Surgical History:  Procedure Laterality Date  . ABDOMINAL HYSTERECTOMY    . APPENDECTOMY    . ARTHROPLASTY Right 06/02/2017  RIGHT PATELLA-FEMORAL ARTHROPLASTY, with placement of a cemented 29 mm Stryker asymmetric triathlon patellar button, revision of the tibial polyethylene bearing.  Marland Kitchen CARPAL TUNNEL RELEASE Bilateral   . CATARACT EXTRACTION W/ INTRAOCULAR LENS  IMPLANT, BILATERAL Bilateral   . CHOLECYSTECTOMY OPEN    . DILATION AND CURETTAGE OF UTERUS    . FOOT FUSION Right   . INGUINAL HERNIA REPAIR Right X 2  . INSERT / REPLACE / REMOVE PACEMAKER  2017   medtronic  . JOINT REPLACEMENT    . LAPAROTOMY N/A 11/21/2016   Procedure: EXPLORATORY LAPAROTOMY, REVISION OF  GASTROJEJUNOSTOMY, ESOPHAGOGASTROSCOPY;  Surgeon: Clovis Riley, MD;  Location: [facility];  Service: General;  Laterality: N/A;  . LEFT ATRIAL APPENDAGE OCCLUSION  2018   placed  via groin  . LUMBAR FUSION  X 2  . PATELLA-FEMORAL ARTHROPLASTY Right 06/02/2017   Procedure: RIGHT PATELLA-FEMORAL ARTHROPLASTY;  Surgeon: Frederik Pear, MD;  Location: [facility];  Service:  Orthopedics;  Laterality: Right;  . REVERSE SHOULDER ARTHROPLASTY Left    reverse shoulder replacement  . ROUX-EN-Y GASTRIC BYPASS  2005  . ROUX-EN-Y PROCEDURE  20017 X 2   "revisions"  . TONSILLECTOMY AND ADENOIDECTOMY    . TOTAL HIP ARTHROPLASTY Right 02/24/2017   Procedure: TOTAL HIP ARTHROPLASTY;  Surgeon: Earlie Server, MD;  Location: [facility];  Service: Orthopedics;  Laterality: Right;  . TOTAL KNEE ARTHROPLASTY Bilateral   . TOTAL SHOULDER ARTHROPLASTY Bilateral   . TUBAL LIGATION      Family History  Problem Relation Age of Onset  . Heart disease Mother   . Liver disease Father   . Ulcers Father   . Prostate cancer Brother   . Colon cancer Neg Hx     Social History:  reports that she has never smoked. She has never used smokeless tobacco. She reports that she does not drink alcohol or use drugs.  Allergies:  Allergies  Allergen Reactions  . Demerol [Meperidine] Anaphylaxis  . Nsaids     Patient has had MULTIPLE revisions of her gastrojejunostomy for marginal ulcers including recent perforation    Medications:  Current Meds  Medication Sig  . acetaminophen (TYLENOL) 500 MG tablet Take 1,000 mg every 6 (six) hours as needed by mouth (pain).   . Ensure (ENSURE) Take 237 mLs by mouth daily as needed (meal supplement).  . gabapentin (NEURONTIN) 300 MG capsule TAKE 2 CAPSULEs(600 MG) BY MOUTH THREE TIMES DAILY (Patient taking differently: Take 600 mg by mouth 3 (three) times daily. )  . losartan (COZAAR) 25 MG tablet Take 25 mg by mouth daily.   . metoprolol tartrate (LOPRESSOR) 50 MG tablet Take 50 mg by mouth 2 (two) times a day.  . pantoprazole (PROTONIX) 40 MG tablet Take 40 mg by mouth 2 (two) times a day.  Marland Kitchen rOPINIRole (REQUIP) 0.5 MG tablet TAKE 1 TABLET BY MOUTH THREE TIMES DAILY (Patient taking differently: Take 0.5-1 mg by mouth See admin instructions. Take one tablet (0.5 mg) by mouth every afternoon and two tablets (1 mg) at night - for restless legs)  .  sucralfate (CARAFATE) 1 g tablet TAKE 1 TABLET(1 GRAM) BY MOUTH FOUR TIMES DAILY AT BEDTIME WITH MEALS (Patient taking differently: Take 1 g by mouth 4 (four) times daily. )  . TOVIAZ 4 MG TB24 tablet TAKE 1 TABLET(4 MG) BY MOUTH DAILY (Patient taking differently: Take 4 mg by mouth daily. )     Results for orders placed or performed during the hospital encounter of 05/05/18 (from the  past 48 hour(s))  CBC with Differential     Status: Abnormal   Collection Time: 05/05/18 11:30 AM  Result Value Ref Range   WBC 16.4 (H) 4.0 - 10.5 K/uL   RBC 2.79 (L) 3.87 - 5.11 MIL/uL   Hemoglobin 8.3 (L) 12.0 - 15.0 g/dL   HCT 26.8 (L) 36.0 - 46.0 %   MCV 96.1 80.0 - 100.0 fL   MCH 29.7 26.0 - 34.0 pg   MCHC 31.0 30.0 - 36.0 g/dL   RDW 15.2 11.5 - 15.5 %   Platelets 860 (H) 150 - 400 K/uL   nRBC 0.8 (H) 0.0 - 0.2 %   Neutrophils Relative % 88 %   Neutro Abs 14.5 (H) 1.7 - 7.7 K/uL   Lymphocytes Relative 6 %   Lymphs Abs 1.0 0.7 - 4.0 K/uL   Monocytes Relative 5 %   Monocytes Absolute 0.8 0.1 - 1.0 K/uL   Eosinophils Relative 0 %   Eosinophils Absolute 0.0 0.0 - 0.5 K/uL   Basophils Relative 0 %   Basophils Absolute 0.1 0.0 - 0.1 K/uL   Immature Granulocytes 1 %   Abs Immature Granulocytes 0.14 (H) 0.00 - 0.07 K/uL    Comment: Performed at [facility], [address], [place], [place] [address]  POC occult blood, ED RN will collect     Status: Abnormal   Collection Time: 05/05/18  1:16 PM  Result Value Ref Range   Fecal Occult Bld POSITIVE (A) NEGATIVE  Comprehensive metabolic panel     Status: Abnormal   Collection Time: 05/05/18  1:26 PM  Result Value Ref Range   Sodium 137 135 - 145 mmol/L   Potassium 3.4 (L) 3.5 - 5.1 mmol/L   Chloride 103 98 - 111 mmol/L   CO2 17 (L) 22 - 32 mmol/L   Glucose, Bld 141 (H) 70 - 99 mg/dL   BUN 25 (H) 8 - 23 mg/dL   Creatinine, Ser 0.78 0.44 - 1.00 mg/dL   Calcium 9.1 8.9 - 10.3 mg/dL   Total Protein 6.5 6.5 - 8.1 g/dL   Albumin 2.8 (L) 3.5 -  5.0 g/dL   AST 29 15 - 41 U/L   ALT 70 (H) 0 - 44 U/L   Alkaline Phosphatase 70 38 - 126 U/L   Total Bilirubin 0.7 0.3 - 1.2 mg/dL   GFR calc non Af Amer >60 >60 mL/min   GFR calc Af Amer >60 >60 mL/min   Anion gap 17 (H) 5 - 15    Comment: Performed at [facility], [place] [address] [place], [place] [address]  Lipase, blood     Status: None   Collection Time: 05/05/18  1:26 PM  Result Value Ref Range   Lipase 27 11 - 51 U/L    Comment: Performed at [facility] [address], [place], [place] [address]  Type and screen     Status: None   Collection Time: 05/05/18  1:26 PM  Result Value Ref Range   ABO/RH(D) A POS    Antibody Screen NEG    Sample Expiration      05/08/2018 Performed at [facility], [place] [address], [place], [place] [address]   Urinalysis, Routine w reflex microscopic     Status: Abnormal   Collection Time: 05/05/18  3:57 PM  Result Value Ref Range   Color, Urine YELLOW YELLOW   APPearance HAZY (A) CLEAR   Specific Gravity, Urine 1.034 (H) 1.005 -  1.030   pH 5.0 5.0 - 8.0   Glucose, UA NEGATIVE NEGATIVE mg/dL   Hgb urine dipstick MODERATE (A) NEGATIVE   Bilirubin Urine NEGATIVE NEGATIVE   Ketones, ur 20 (A) NEGATIVE mg/dL   Protein, ur NEGATIVE NEGATIVE mg/dL   Nitrite NEGATIVE NEGATIVE   Leukocytes,Ua LARGE (A) NEGATIVE   RBC / HPF 11-20 0 - 5 RBC/hpf   WBC, UA >50 (H) 0 - 5 WBC/hpf   Bacteria, UA RARE (A) NONE SEEN   Squamous Epithelial / LPF 11-20 0 - 5   Mucus PRESENT     Comment: Performed at [facility], [place] [address], [place], [place] [address]  Acetaminophen level     Status: Abnormal   Collection Time: 05/05/18  6:45 PM  Result Value Ref Range   Acetaminophen (Tylenol), Serum <10 (L) 10 - 30 ug/mL    Comment: (NOTE) Therapeutic concentrations vary significantly. A range of 10-30 ug/mL  may be an effective concentration for many patients. However, some  are best treated at concentrations outside of this  range. Acetaminophen concentrations >150 ug/mL at 4 hours after ingestion  and >50 ug/mL at 12 hours after ingestion are often associated with  toxic reactions. Performed at [facility], [place] [address], [place], [place] [address]   Lactic acid, plasma     Status: None   Collection Time: 05/05/18  6:45 PM  Result Value Ref Range   Lactic Acid, Venous 1.1 0.5 - 1.9 mmol/L    Comment: Performed at [facility] [address] [place], [place] [address]  Iron and TIBC     Status: Abnormal   Collection Time: 05/05/18  6:45 PM  Result Value Ref Range   Iron 8 (L) 28 - 170 ug/dL   TIBC 245 (L) 250 - 450 ug/dL   Saturation Ratios 3 (L) 10.4 - 31.8 %   UIBC 237 ug/dL    Comment: Performed at [facility], [place] [address] [place], [place] [address]  Ferritin     Status: Abnormal   Collection Time: 05/05/18  6:45 PM  Result Value Ref Range   Ferritin 418 (H) 11 - 307 ng/mL    Comment: Performed at [facility], [place] [address], [place], [place] [address]  Lipid panel     Status: Abnormal   Collection Time: 05/05/18  6:45 PM  Result Value Ref Range   Cholesterol 114 0 - 200 mg/dL   Triglycerides 123 <150 mg/dL   HDL 31 (L) >40 mg/dL   Total CHOL/HDL Ratio 3.7 RATIO   VLDL 25 0 - 40 mg/dL   LDL Cholesterol 58 0 - 99 mg/dL    Comment:        Total Cholesterol/HDL:CHD Risk Coronary Heart Disease Risk Table                     Men   Women  1/2 Average Risk   3.4   3.3  Average Risk       5.0   4.4  2 X Average Risk   9.6   7.1  3 X Average Risk  23.4   11.0        Use the calculated Patient Ratio above and the CHD Risk Table to determine the patient's CHD Risk.        ATP III CLASSIFICATION (LDL):  <100     mg/dL   Optimal  100-129  mg/dL   Near or  Above                    Optimal  130-159  mg/dL   Borderline  160-189  mg/dL   High  >190     mg/dL   Very High Performed at [facility] [address], [place], [place] [address]     Ct Abdomen  Pelvis Wo Contrast  Result Date: 05/05/2018 CLINICAL DATA:  Chronic lower abdominal pain. EXAM: CT ABDOMEN AND PELVIS WITHOUT CONTRAST TECHNIQUE: Multidetector CT imaging of the abdomen and pelvis was performed following the standard protocol without IV contrast. COMPARISON:  CT of the abdomen and pelvis with contrast on 08/08/2017 FINDINGS: Lower chest: No acute abnormality. Hepatobiliary: Stable dilatation of bile ducts after prior cholecystectomy. Unenhanced appearance of the liver is unremarkable. Pancreas: Unremarkable. No pancreatic ductal dilatation or surrounding inflammatory changes. Spleen: Normal in size without focal abnormality. Adrenals/Urinary Tract: No adrenal masses. Stable cortical scarring of both kidneys. No hydronephrosis or renal calculi identified. The bladder appears unremarkable. Stomach/Bowel: Stable appearance of prior gastric bypass. No evidence of bowel obstruction, ileus or free intraperitoneal air. Stable diverticulosis of the sigmoid colon without evidence by CT of acute diverticulitis. Vascular/Lymphatic: Stable mild aneurysmal dilatation of the infrarenal abdominal aorta measuring 3.3 cm in greatest diameter. No evidence of rupture. No enlarged lymph nodes identified. Reproductive: Status post hysterectomy. No adnexal masses. Other: No abdominal wall hernia or abnormality. No abdominopelvic ascites. Musculoskeletal: Stable appearance of spine after prior posterior lumbar fusion at L3-4. No fractures or bony lesions identified. IMPRESSION: 1. No acute findings in the abdomen or pelvis. 2. Stable dilatation of bile ducts after prior cholecystectomy. 3. Stable diverticulosis of the sigmoid colon without evidence of acute diverticulitis. 4. Stable mild aneurysmal disease of the abdominal aorta measuring 3.3 cm. 5. Stable appearance of prior gastric bypass. Electronically Signed   By: Aletta Edouard M.D.   On: 05/05/2018 15:41   Dg Chest 2 View  Addendum Date: 05/05/2018    ADDENDUM REPORT: 05/05/2018 21:33 ADDENDUM: Right PICC line has been placed with the tip in the SVC. Electronically Signed   By: Rolm Baptise M.D.   On: 05/05/2018 21:33   Result Date: 05/05/2018 CLINICAL DATA:  Abdominal pain EXAM: CHEST - 2 VIEW COMPARISON:  02/24/2017 FINDINGS: Left pacer remains in place, unchanged. Cardiomegaly. Lungs clear. No effusions. No acute bony abnormality. Prior bilateral shoulder replacements. IMPRESSION: Cardiomegaly.  No acute findings. Electronically Signed: By: Rolm Baptise M.D. On: 05/05/2018 21:14   Dg Abd 2 Views  Result Date: 05/05/2018 CLINICAL DATA:  Abdominal pain EXAM: ABDOMEN - 2 VIEW COMPARISON:  CT 05/05/2018 FINDINGS: Nonobstructive bowel gas pattern. No organomegaly or free air. Mild cardiomegaly. Pacer wires noted in the right heart. Lung bases clear. IMPRESSION: No obstruction or free air. Electronically Signed   By: Rolm Baptise M.D.   On: 05/05/2018 21:14   [place] Ekg Site Rite  Result Date: 05/05/2018 If Site Rite image not attached, placement could not be confirmed due to current cardiac rhythm.  Ct Angio Abd/pel W/ And/or W/o  Result Date: 05/05/2018 CLINICAL DATA:  Abdominal pain.  Concern for ischemia. EXAM: CTA ABDOMEN AND PELVIS wITHOUT AND WITH CONTRAST TECHNIQUE: Multidetector CT imaging of the abdomen and pelvis was performed using the standard protocol during bolus administration of intravenous contrast. Multiplanar reconstructed images and MIPs were obtained and reviewed to evaluate the vascular anatomy. CONTRAST:  154mL OMNIPAQUE IOHEXOL 350 MG/ML SOLN COMPARISON:  05/05/2018 FINDINGS: VASCULAR Aorta: 3  cm infrarenal abdominal aortic aneurysm.  Atherosclerosis. Celiac: Very small in caliber. The splenic artery appears to possibly fill via collaterals. SMA: Patent without evidence of aneurysm, dissection, vasculitis or significant stenosis. Renals: 2 small left renal arteries and 1 right renal artery. No stenosis in the right renal artery.  IMA: Patent Inflow: Iliac atherosclerosis. No aneurysm or stenosis. No dissection. Proximal Outflow: Grossly unremarkable. Veins: No obvious venous abnormality within the limitations of this arterial phase study. Review of the MIP images confirms the above findings. NON-VASCULAR Lower chest: Cardiomegaly. Pacer wires in the right heart. Posterior left diaphragmatic hernia containing fat. Lung bases clear. No effusions. Hepatobiliary: Prior cholecystectomy.  No focal hepatic abnormality. Pancreas: No focal abnormality or ductal dilatation. Spleen: No focal abnormality.  Normal size. Adrenals/Urinary Tract: Area of cortical thinning and scarring in the anterior left kidney and upper pole of the left kidney. Lobular contours of the right kidney. No hydronephrosis. Adrenal glands and urinary bladder unremarkable. Stomach/Bowel: Postoperative changes in the stomach and proximal small bowel. No visible complicating feature. Scattered colonic diverticulosis, most pronounced in the descending and sigmoid colon. No active diverticulitis. Small bowel decompressed. No evidence of bowel obstruction. Lymphatic: No adenopathy Reproductive: Prior hysterectomy.  No adnexal masses. Other: No free fluid or free air. Musculoskeletal: Prior right hip replacement. Posterior fusion changes in the lumbar spine. No acute bony abnormality. IMPRESSION: VASCULAR Calcified aorta. Infrarenal abdominal aortic aneurysm, 3 cm. Recommend followup by ultrasound in 3 years. This recommendation follows ACR consensus guidelines: White Paper of the ACR Incidental Findings Committee II on Vascular Findings. J Am Coll Radiol 2013; 10:789-794. 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 SMA and IMA appear widely patent. NON-VASCULAR Prior cholecystectomy and hysterectomy. Colonic diverticulosis. No active diverticulitis. No visible bowel changes to suggest ischemia. Electronically Signed   By: Rolm Baptise M.D.   On: 05/05/2018 22:06    Review of Systems  Constitutional: Negative.   HENT: Negative.   Eyes: Negative.   Respiratory: Negative.   Cardiovascular: Positive for palpitations (h/o atrial fibrillation).  Gastrointestinal: Positive for abdominal pain, blood in stool, constipation and nausea.  Genitourinary:       Foul smelling urine  Musculoskeletal: Positive for back pain (chronic low back pain.).  Skin: Negative.   Neurological: Negative.   Endo/Heme/Allergies: Negative.   Psychiatric/Behavioral: The patient is nervous/anxious.      Blood pressure 102/65, pulse (!) 106, temperature 98.5 F (36.9 C), temperature source Oral, resp. rate 18, height 4\' 11"  (1.499 m), weight 55.3 kg, SpO2 (!) 85 %. Physical Exam  Constitutional: She is oriented to person, place, and time. She appears well-developed and well-nourished. She appears distressed.  HENT:  Head: Normocephalic and atraumatic.  Eyes: Pupils are equal, round, and reactive to light. Conjunctivae are normal. Right eye exhibits no discharge. Left eye exhibits no discharge. No scleral icterus.  Neck: Normal range of motion. Neck supple. No tracheal deviation present. No thyromegaly present.  Cardiovascular: Regular rhythm and intact distal pulses. Exam reveals no gallop and no friction rub.  No murmur heard. tachycardic  Respiratory: Effort normal and breath sounds normal. No respiratory distress. She has no wheezes. She has no rales. She exhibits no tenderness.  GI: Soft. She exhibits no distension and no mass. There is abdominal tenderness (LLQ). There is no rebound and no guarding.  Genitourinary: Rectum:     Guaiac result positive.   Musculoskeletal: Normal range of motion.        General:  No tenderness or deformity.  Neurological: She is alert and oriented to person, place, and time. Coordination normal.  Skin: Skin is warm and dry. No rash noted. She is not diaphoretic. No erythema. No pallor.  Psychiatric: She  has a normal mood and affect. Her behavior is normal. Judgment and thought content normal.    Assessment/Plan: Chronic LLQ abdominal pain Chronic gastrojejunal ulceration Heme positive stool Tachycardia UTI Diverticulosis Leukocytosis Protein calorie malnutrition  This chronic abdominal pain is understandably a very frustrating problem for the patient.  I do not have a definitive diagnosis for the patient at this point, but she does not have any evidence for urgent need for operative intervention.  She has diverticuli, but no evidence of inflammation on scan.  She has no evidence of vascular compromise to the bowel on CT angio or by lactic acid.  She does not have any evidence of left nephroureterolithiasis.  She does have a left ovary, but has no evidence of ovarian mass or cyst.  There is no evidence of internal hernia or volvulus.    Because of the heme positive stool and the constipation as well as the location of the pain, I do recommend GI consult with consideration of colonoscopy.  She could have subacute ischemia to the sigmoid colon or a mass.  She also has weight loss which could be concerning for malignancy.  I think that the heme positive stool is most likely from continued issues with ulceration her gastrojejunostomy.  I do not know that she needs a repeat upper endoscopy since she reports EGD in February.    Also, her symptomatology is primarily in the left lower quadrant.  The patient should be assessed for prealbumin to see if she may need IV nutrition.  It would be helpful to have an endoscopy report from [place].  This was not in the patient's paperwork and I cannot find it in [place].  I unfortunately suspect that the cause of this patient's abdominal pain will not be solved in a short period of time given the fact that she has been admitted 3 times over 5 to 6 months without resolution at multiple hospitals.  I am trying to look for causes that have not been  evaluated.  This is why I suspect that colonoscopy could potentially be helpful as this has not yet been performed.   Stark Klein 05/05/2018, 10:17 PM

## 2018-05-05 NOTE — ED Notes (Signed)
Got patient undress on the monitor did ekg shown to Dr Tamera Punt patient is resting with call bell in reach

## 2018-05-05 NOTE — Progress Notes (Signed)
NURSING PROGRESS NOTE  Deanna Spencer [id number] Admission Data: 05/05/2018 6:55 PM Attending Provider: Martyn Malay, MD PVX:[id number], Deanna Reading, DO Code Status: partial, no CPR  Deanna Spencer is a 78 y.o. female patient admitted from ED:  -No acute distress noted.  -No complaints of shortness of breath.  -No complaints of chest pain.   Cardiac Monitoring: Box # 24 in place. Cardiac monitor yields:atrial fibrillation, with ventricular rate of 119.  Blood pressure 100/89, pulse (!) 121, temperature 98.5 F (36.9 C), temperature source Oral, resp. rate 18, height 4\' 11"  (1.499 m), weight 55.3 kg, SpO2 100 %.   IV Fluids:  IV in place, occlusive dsg intact without redness, IV cath hand right, condition patent and no redness none.   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  Past Surgical History:   has a past surgical history that includes Appendectomy; laparotomy (N/A, 11/21/2016); Foot Fusion (Right); Left Atrial Appendage Occlusion (2018); Reverse shoulder arthroplasty (Left); Cholecystectomy open; Abdominal hysterectomy; Inguinal hernia repair (Right, X 2); Carpal tunnel release (Bilateral); Lumbar fusion (X 2); Total knee arthroplasty (Bilateral); Total shoulder arthroplasty (Bilateral); Joint replacement; Cataract  extraction w/ intraocular lens  implant, bilateral (Bilateral); Tonsillectomy and adenoidectomy; Dilation and curettage of uterus; Tubal ligation; Roux-en-Y Gastric Bypass (2005); Roux-en-y procedure (20017 X 2); Insert / replace / remove pacemaker (2017); Total hip arthroplasty (Right, 02/24/2017); Arthroplasty (Right, 06/02/2017); and Patella-femoral arthroplasty (Right, 06/02/2017).  Social History:   reports that she has never smoked. She has never used smokeless tobacco. She reports that she does not drink alcohol or use drugs.  Skin: intact  Patient orientated to room. Information packet given to patient/family. Admission inpatient armband information verified with patient/family to include name and date of birth and placed on patient arm. Side rails up x 2, fall assessment and education completed with patient/family. Patient/family able to verbalize understanding of risk associated with falls and verbalized understanding to call for assistance before getting out of bed. Call light within reach. Patient/family able to voice and demonstrate understanding of unit orientation instructions.

## 2018-05-05 NOTE — ED Notes (Signed)
IV team unable to place any type of peripheral IV or midline. MD will order PICC line placement for patient. Pt has been attempted on x6 attempts.

## 2018-05-05 NOTE — H&P (Signed)
[facility] Admission History and Physical Service Pager: [phone]  Patient name: Deanna Spencer Medical record number: [id number] Date of birth: 1940/08/13 Age: 78 y.o. Gender: female  Primary Care Provider: Nuala Alpha, DO Consultants: General surgery Code Status: Intubation ok, no cpr  Chief Complaint: abd pain  Assessment and Plan: Deanna Spencer is a 78 y.o. female presenting with abdominal pain. PMH is significant for Roux-en-Y surgery with multiple claimed revisions, multiple bleeding ulcers and history, repeated diverticulitis, history of MI, pacemaker and watchman, A. 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 fib without anticoagulation.   Belly exam was exquisitely tender with guarding and rebound tenderness, patient was extremely uncomfortable.  Belly was not notably distended.  Wide differential at this point.  CT imaging did not show acute diverticulitis, the patient was discharged [facility] on April 8 with diagnosed diverticulitis on [place] and Cipro which she only took 2 days of and stopped taking due to stomach upset.  Patient is known to have multiple recurring episodes of bleeding ulcers and is FOBT positive, but she claims that this pain is more significant than those in the past.  Hemoglobin was 8.3 on admission, baseline appears to be somewhere between 10-12.  Patient has noticed dark stools but says that she has stopped having bowel movements over the last week which he attributes to lack of appetite and not eating which she calls a solid meal for 2 weeks.  No significant stool burden noted on CT imaging but complaint of inability to hold down  food and lack of bowel movement leaves constipation has lower on differential.   Patient with elevated white count to 16.4.  Which is concerning for intra-abdominal infection.  She is A. fib without anticoagulation there is also concern for potential clotting and mesenteric ischemia.  Patient also with 3.3 cm AAA which is stable from prior imaging, and CT on this admission does not show acute change.  Also on differential based off UA would be urinary tract infection, but there is no hydronephrosis noted on CT and although patient complains of malodorous urine she does not complain of any dysuria.  Lipase within normal limits -Admit to telemetry, Dr. Owens Shark -General surgery consulted for exam out of concern of potential acute abdomen -Blood cultures and urine cultures drawn -We will start Zosyn after cultures drawn -Out of concern for mesenteric ischemia will get abdomen CTA -N.p.o., sips with meds -Gentle hydration as patient has reduced ejection fraction -8 PM CBC followed by 5 AM, if acutely bleeding or potentially consult GI -Given cardiac history, transfusion threshold is 8 -Trend lactic acid  Difficult IV access: Received patient IV team, multiple nurses and the IV team were unable to get adequate peripheral access for drawing of cultures and administering contrast for imaging.  Normal protocol is to wait 48 hours after cultures from placing a PICC line but patient is in need of urgent imaging.  If cultures come back positive it should be noted that PICC line is not source this is been placed right now.  Current hospital policy says the [facility] team is not allowed over time they, but on my instruction team is agreed to stay because this is a potentially life-threatening issue for this patient and is very time sensitive.  I can be contacted for any questions. -Authorization for PICC line to be placed   Potential Tylenol toxicity: Patient describes take Tylenol "like a tick tacks "she says she takes  2-3 extra strength Tylenol every 3 hours while awake and has been doing this for the last 3 weeks.  She says she does this because she is not allowed to take NSAIDs due to history of GI bleed.  AST within normal limits and ALT elevated 70.  No suspicion of intentional overdose -Stat Tylenol level  Complicated cardiac history : history of MI, pacemaker and watchman, A. fib with no anticoagulation,  HFrEF 30-35%.:  Patient tachycardic to the 120s on admission exam.  Not anticoagulated due to history of GI bleed.  Is on metoprolol for rate control.  Rate control medication regimen restricted by ejection fraction of 30 to 35%. -Unsure of patient's morning metoprolol dose, will restart metoprolol as blood pressure is within normal limits -Monitor on telemetry  overactive bladder-continue Toviaz  restless leg-on ropinirole  FEN/GI: npo sips w/ meds Prophylaxis: SCDs  Disposition: Telemetry  History of Present Illness:  Deanna Spencer is a 78 y.o. female presenting with abdominal pain.  She says this is been on and off since November.  These few months have been consistent with her prior history of ulcers in the past, although the severity has been significantly worse for the last 2 weeks for her.  She states that she has been unable to eat regular food for the last 2 weeks because the pain is been so bad, she has had episodes of dry heaving with no sign of blood.  Bowel movements stopped approximately 1 week ago which she attributes to low intake.  Prior to that they had been dark.  There have been minor incidences of trace blood on tissue paper.  Patient describes being consistent with prescribed medication although the medication list in the epic chart contains some erroneous information, particularly of note she is not on Eliquis.  She has had 3 hospitalizations in [place] since November.  Per records that she printed and brought with her one of these seem to indicate finding an ulcer, in addition to  find active diverticulitis in early April.  She was discharged on April 8 with metronidazole and ciprofloxacin for diverticulitis, she only took 2 days of the prescribed 7 for this as she says it causes gastric upset.  She says with her history of Roux-en-Y she is nervous that this will be another episode in which she describes as multiple revision surgeries being required.  She did have a surgery at [facility] in the last 2 years with a Dr. Windle Guard.  She denies any passing out, she says that she has had malodorous urine but denies any urine symptoms beyond that.  Denies any recent injuries or falls.  She would like to be partial code, no CPR but would like intubation.  Review Of Systems: Per HPI with the following additions:   Review of Systems  Constitutional: Positive for malaise/fatigue. Negative for chills and fever.  HENT: Negative for congestion, hearing loss and nosebleeds.   Respiratory: Negative for cough, hemoptysis, shortness of breath, wheezing and stridor.   Cardiovascular: Positive for palpitations. Negative for chest pain and leg swelling.  Gastrointestinal: Positive for abdominal pain, blood in stool, constipation, heartburn, melena, nausea and vomiting.  Genitourinary: Negative for dysuria, frequency and urgency.       Malordorous urine  Musculoskeletal: Negative for falls.  Skin: Negative.  Negative for  itching and rash.  Neurological: Positive for weakness. Negative for sensory change, speech change, focal weakness and loss of consciousness.       Restless leg  Endo/Heme/Allergies: Negative for polydipsia.  Psychiatric/Behavioral: Negative for substance abuse.    Patient Active Problem List   Diagnosis Date Noted  . Acute abdomen 05/05/2018  . Diplopia 09/27/2017  . Rheumatoid arthritis involving both hands ([place]) 09/27/2017  . Seasonal allergies 08/07/2017  . Vision changes 08/07/2017  . Liver lesion 08/07/2017  . Cerumen impaction 06/28/2017  . Bee sting 06/28/2017   . Primary osteoarthritis of right knee 06/02/2017  . Osteoarthritis of right knee 05/30/2017  . Primary osteoarthritis of left hip 02/24/2017  . AAA (abdominal aortic aneurysm) without rupture ([place]) 12/21/2016  . Chronic systolic heart failure ([place]) 12/14/2016  . S/P exploratory laparotomy 11/22/2016  . Perforated viscus 11/22/2016  . Atrial fibrillation ([place]) 11/22/2016  . Peripheral neuropathy 11/22/2016  . GERD (gastroesophageal reflux disease) 11/22/2016  . Other chronic pain 11/18/2016  . 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 History of heart attack 11/18/2016  . Parasomnia 11/18/2016  . Need for immunization against influenza 11/18/2016  . Encounter to establish care 11/18/2016  . History of duodenal ulcer 11/18/2016    Past Medical History: Past Medical History:  Diagnosis Date  . AAA (abdominal aortic aneurysm) ([place])    3.5cm by CT 11/29/16  . Anemia   . Childhood asthma    connected to allergies  . Chronic lower back pain   . Diverticulosis   . Duodenal ulcer   . Dyspnea   . Dysrhythmia    a fib  . GERD (gastroesophageal reflux disease)   . GI bleed    "have had both upper and lower GIB"  . Gout   . Heart attack ([place]) 2017  . Heart murmur   . History of blood transfusion 2017-2018   "related to bleeding ulcers; 47 units PRBC; 2U plasma" (11/23/2016)  . History of hiatal hernia   . Hx of blood clots    in heart  . IBS (irritable bowel syndrome)   . Neuropathy   . Osteoarthritis   . Osteoporosis   . Peripheral neuropathy   . 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 Presence of Watchman left atrial appendage closure device   . Raynaud disease   . Rheumatoid arteritis ([place])   . SSS (sick sinus syndrome) ([place])    a. s/p Medtronic PPM placement in 2017  . Stomach ulcer     Past Surgical History: Past Surgical History:  Procedure Laterality Date  . ABDOMINAL  HYSTERECTOMY    . APPENDECTOMY    . ARTHROPLASTY Right 06/02/2017   RIGHT PATELLA-FEMORAL ARTHROPLASTY, with placement of a cemented 29 mm Stryker asymmetric triathlon patellar button, revision of the tibial polyethylene bearing.  Marland Kitchen CARPAL TUNNEL RELEASE Bilateral   . CATARACT EXTRACTION W/ INTRAOCULAR LENS  IMPLANT, BILATERAL Bilateral   . CHOLECYSTECTOMY OPEN    . DILATION AND CURETTAGE OF UTERUS    . FOOT FUSION Right   . INGUINAL HERNIA REPAIR Right X 2  . INSERT / REPLACE / REMOVE PACEMAKER  2017   medtronic  . JOINT REPLACEMENT    . LAPAROTOMY N/A 11/21/2016   Procedure: EXPLORATORY LAPAROTOMY, REVISION OF  GASTROJEJUNOSTOMY, ESOPHAGOGASTROSCOPY;  Surgeon: Clovis Riley, MD;  Location: [facility];  Service: General;  Laterality: N/A;  . LEFT ATRIAL APPENDAGE OCCLUSION  2018  placed  via groin  . LUMBAR FUSION  X 2  . PATELLA-FEMORAL ARTHROPLASTY Right 06/02/2017   Procedure: RIGHT PATELLA-FEMORAL ARTHROPLASTY;  Surgeon: Frederik Pear, MD;  Location: [facility];  Service: Orthopedics;  Laterality: Right;  . REVERSE SHOULDER ARTHROPLASTY Left    reverse shoulder replacement  . ROUX-EN-Y GASTRIC BYPASS  2005  . ROUX-EN-Y PROCEDURE  20017 X 2   "revisions"  . TONSILLECTOMY AND ADENOIDECTOMY    . TOTAL HIP ARTHROPLASTY Right 02/24/2017   Procedure: TOTAL HIP ARTHROPLASTY;  Surgeon: Earlie Server, MD;  Location: [facility];  Service: Orthopedics;  Laterality: Right;  . TOTAL KNEE ARTHROPLASTY Bilateral   . TOTAL SHOULDER ARTHROPLASTY Bilateral   . TUBAL LIGATION      Social History: Social History   Tobacco Use  . Smoking status: Never Smoker  . Smokeless tobacco: Never Used  Substance Use Topics  . Alcohol use: No    Frequency: Never    Comment: very rare  . Drug use: No   Additional social history: Splits time between [place] and [place], daughter is main contact (in demographics and decision maker if patient cannot respond  Please also refer to relevant sections of EMR.  Family  History: Family History  Problem Relation Age of Onset  . Heart disease Mother   . Liver disease Father   . Ulcers Father   . Prostate cancer Brother   . Colon cancer Neg Hx    Allergies and Medications: Allergies  Allergen Reactions  . Demerol [Meperidine] Anaphylaxis  . Nsaids     Patient has had MULTIPLE revisions of her gastrojejunostomy for marginal ulcers including recent perforation   No current facility-administered medications on file prior to encounter.    Current Outpatient Medications on File Prior to Encounter  Medication Sig Dispense Refill  . acetaminophen (TYLENOL) 500 MG tablet Take 1,000 mg every 6 (six) hours as needed by mouth (pain).     Marland Kitchen apixaban (ELIQUIS) 2.5 MG TABS tablet Take 1 tablet (2.5 mg total) by mouth 2 (two) times daily. 30 tablet 0  . bismuth subsalicylate (PEPTO-BISMOL) 262 MG chewable tablet Chew 2 tablets (524 mg total) by mouth as needed. Can take up to 8 tablets total per day. Wait one hour between doses. 30 tablet 0  . colchicine 0.6 MG tablet Take 1 tablet (0.6 mg total) by mouth 2 (two) times daily as needed (gout attacks). 30 tablet 0  . diclofenac (VOLTAREN) 75 MG EC tablet TAKE 1 TABLET(75 MG) BY MOUTH TWICE DAILY 180 tablet 1  . ferrous sulfate 325 (65 FE) MG tablet You can resume this in a couple weeks after your feeling better. (Patient taking differently: Take 325 mg by mouth daily. )  3  . gabapentin (NEURONTIN) 300 MG capsule TAKE 2 CAPSULEs(600 MG) BY MOUTH THREE TIMES DAILY 360 capsule 6  . loratadine (CLARITIN) 10 MG tablet Take 1 tablet (10 mg total) by mouth daily. 30 tablet 11  . Melatonin 1 MG CAPS Take 1 capsule (1 mg total) by mouth at bedtime. 30 capsule 6  . oxyCODONE-acetaminophen (PERCOCET/ROXICET) 5-325 MG tablet Take 1 tablet by mouth every 4 (four) hours as needed for severe pain. 30 tablet 0  . rOPINIRole (REQUIP) 0.5 MG tablet TAKE 1 TABLET BY MOUTH THREE TIMES DAILY 90 tablet 0  . rOPINIRole (REQUIP) 0.5 MG tablet  TAKE 1 TABLET BY MOUTH THREE TIMES DAILY 270 tablet 0  . sucralfate (CARAFATE) 1 g tablet TAKE 1 TABLET(1 GRAM) BY MOUTH FOUR TIMES DAILY  AT BEDTIME WITH MEALS 368 tablet 0  . tiZANidine (ZANAFLEX) 2 MG tablet Take 1 tablet (2 mg total) by mouth every 6 (six) hours as needed. 60 tablet 0  . TOVIAZ 4 MG TB24 tablet TAKE 1 TABLET(4 MG) BY MOUTH DAILY 30 tablet 0  . TOVIAZ 4 MG TB24 tablet TAKE 1 TABLET(4 MG) BY MOUTH DAILY 90 tablet 3  . traMADol (ULTRAM) 50 MG tablet Take 50 mg by mouth every 6 (six) hours as needed.      Objective: BP 122/76   Pulse (!) 123   Temp 98.3 F (36.8 C) (Oral)   Resp (!) 23   Ht 4\' 11"  (1.499 m)   Wt 55.3 kg   SpO2 100%   BMI 24.64 kg/m  Exam: General: Frail and extremely uncomfortable, in fetal position on bed Eyes: Clear sclera no drainage ENTM: No epistaxis, chapped lips Neck: Soft, supple Cardiovascular: Tachycardic to 120s, irregular, murmur difficult to auscultate given rate, no edema in legs Respiratory: No increased work of breathing, was tachypneic to 20s, clear to auscultation bilaterally, no crackles or wheeze, no cough during our exam Gastrointestinal: Exquisitely tender abdomen to palpation.  Guarding and rebound, teary-eyed during palpation, abdomen with excess skin, consistent with history of Roux-en-Y MSK: No significant abnormality, patient able to move limbs at will only limited by abdominal pain Derm: No significant wounds or lesions to exposed skin Neuro: No focal deficits to exam Psych: Completely alert and oriented, processing thoughts appropriately  Labs and Imaging: CBC BMET  Recent Labs  Lab 05/05/18 1130  WBC 16.4*  HGB 8.3*  HCT 26.8*  PLT 860*   Recent Labs  Lab 05/05/18 1326  NA 137  K 3.4*  CL 103  CO2 17*  BUN 25*  CREATININE 0.78  GLUCOSE 141*  CALCIUM 9.1     Ct Abdomen Pelvis Wo Contrast  Result Date: 05/05/2018 CLINICAL DATA:  Chronic lower abdominal pain. EXAM: CT ABDOMEN AND PELVIS WITHOUT  CONTRAST TECHNIQUE: Multidetector CT imaging of the abdomen and pelvis was performed following the standard protocol without IV contrast. COMPARISON:  CT of the abdomen and pelvis with contrast on 08/08/2017 FINDINGS: Lower chest: No acute abnormality. Hepatobiliary: Stable dilatation of bile ducts after prior cholecystectomy. Unenhanced appearance of the liver is unremarkable. Pancreas: Unremarkable. No pancreatic ductal dilatation or surrounding inflammatory changes. Spleen: Normal in size without focal abnormality. Adrenals/Urinary Tract: No adrenal masses. Stable cortical scarring of both kidneys. No hydronephrosis or renal calculi identified. The bladder appears unremarkable. Stomach/Bowel: Stable appearance of prior gastric bypass. No evidence of bowel obstruction, ileus or free intraperitoneal air. Stable diverticulosis of the sigmoid colon without evidence by CT of acute diverticulitis. Vascular/Lymphatic: Stable mild aneurysmal dilatation of the infrarenal abdominal aorta measuring 3.3 cm in greatest diameter. No evidence of rupture. No enlarged lymph nodes identified. Reproductive: Status post hysterectomy. No adnexal masses. Other: No abdominal wall hernia or abnormality. No abdominopelvic ascites. Musculoskeletal: Stable appearance of spine after prior posterior lumbar fusion at L3-4. No fractures or bony lesions identified. IMPRESSION: 1. No acute findings in the abdomen or pelvis. 2. Stable dilatation of bile ducts after prior cholecystectomy. 3. Stable diverticulosis of the sigmoid colon without evidence of acute diverticulitis. 4. Stable mild aneurysmal disease of the abdominal aorta measuring 3.3 cm. 5. Stable appearance of prior gastric bypass. Electronically Signed   By: Aletta Edouard M.D.   On: 05/05/2018 15:41    Sherene Sires, DO 05/05/2018, 5:15 PM PGY-2, [place] Intern pager:  [phone], text pages welcome

## 2018-05-05 NOTE — Progress Notes (Signed)
Family Medicine interim progress note  Reviewed EKG which showed afib with rvr. Cardiac monitor 20 minutes after ekg showing HR of 106, afib but no rvr which has been present since admission. Will monitor closely for re-occurrence of rvr, shortness of breath, hypotension. Will continue current orders at this time, will expect continued improved rate control as po metoprolol metabolized.   Guadalupe Dawn MD PGY-2 Family Medicine Resident

## 2018-05-05 NOTE — Progress Notes (Signed)
Pt transport to be to inpatient room within the next 15-20 minutes.  RN requested to wait til transfer done.

## 2018-05-05 NOTE — ED Notes (Signed)
Report given to [place], Therapist, sports. Pt to be met by PICC team upstairs in pt's room. After PICC line is on placed, pt to be transferred to X-ray and CT.

## 2018-05-05 NOTE — Progress Notes (Addendum)
Paged by nurse stating that patient in afib with rvr per tele, with hr in 130s-140s. She is no acute distress but slightly having trouble catching breath. BP 106/59 which is unchanged from earlier. She is due for 2200 oral metoprolol. Can administer and recheck in 5-10 minutes. If BP lowers, becomes more short of breath, then will consider further treatment via IV rate control  Reviewed initial ekg on admission and patient had afib with vent rate of 144. Unclear if this problem has persisted during entirety of admission. Will get stat EKG to re-assess.  Guadalupe Dawn MD PGY-2 Family Medicine Resident

## 2018-05-05 NOTE — ED Notes (Signed)
Missed IV attempt x2 by two separate nurses. Will consult IV team.

## 2018-05-06 ENCOUNTER — Inpatient Hospital Stay (HOSPITAL_COMMUNITY): Payer: Medicare Other | Admitting: Anesthesiology

## 2018-05-06 ENCOUNTER — Encounter (HOSPITAL_COMMUNITY): Payer: Self-pay | Admitting: Family Medicine

## 2018-05-06 ENCOUNTER — Encounter (HOSPITAL_COMMUNITY)
Admission: EM | Disposition: A | Discharge status: Skilled Nursing Facility | Payer: Self-pay | Source: Home / Self Care | Attending: Family Medicine

## 2018-05-06 DIAGNOSIS — R109 Unspecified abdominal pain: Secondary | ICD-10-CM

## 2018-05-06 DIAGNOSIS — R1114 Bilious vomiting: Secondary | ICD-10-CM

## 2018-05-06 DIAGNOSIS — E860 Dehydration: Secondary | ICD-10-CM

## 2018-05-06 DIAGNOSIS — K289 Gastrojejunal ulcer, unspecified as acute or chronic, without hemorrhage or perforation: Secondary | ICD-10-CM

## 2018-05-06 DIAGNOSIS — Z950 Presence of cardiac pacemaker: Secondary | ICD-10-CM | POA: Insufficient documentation

## 2018-05-06 DIAGNOSIS — K210 Gastro-esophageal reflux disease with esophagitis: Secondary | ICD-10-CM

## 2018-05-06 DIAGNOSIS — I4821 Permanent atrial fibrillation: Secondary | ICD-10-CM

## 2018-05-06 DIAGNOSIS — I5022 Chronic systolic (congestive) heart failure: Secondary | ICD-10-CM

## 2018-05-06 DIAGNOSIS — K9189 Other postprocedural complications and disorders of digestive system: Secondary | ICD-10-CM

## 2018-05-06 DIAGNOSIS — Z980 Intestinal bypass and anastomosis status: Secondary | ICD-10-CM

## 2018-05-06 DIAGNOSIS — I4891 Unspecified atrial fibrillation: Secondary | ICD-10-CM | POA: Insufficient documentation

## 2018-05-06 HISTORY — PX: BIOPSY: SHX5522

## 2018-05-06 HISTORY — DX: Other postprocedural complications and disorders of digestive system: K91.89

## 2018-05-06 HISTORY — PX: ESOPHAGOGASTRODUODENOSCOPY (EGD) WITH PROPOFOL: SHX5813

## 2018-05-06 LAB — BASIC METABOLIC PANEL
Anion gap: 11 (ref 5–15)
BUN: 20 mg/dL (ref 8–23)
CO2: 22 mmol/L (ref 22–32)
Calcium: 8.5 mg/dL — ABNORMAL LOW (ref 8.9–10.3)
Chloride: 107 mmol/L (ref 98–111)
Creatinine, Ser: 0.78 mg/dL (ref 0.44–1.00)
GFR calc Af Amer: >60 mL/min (ref >60)
GFR calc non Af Amer: >60 mL/min (ref >60)
Glucose, Bld: 92 mg/dL (ref 70–99)
Potassium: 3.5 mmol/L (ref 3.5–5.1)
Sodium: 140 mmol/L (ref 135–145)

## 2018-05-06 LAB — HEMOGLOBIN AND HEMATOCRIT, BLOOD
HCT: 24.9 % — ABNORMAL LOW (ref 36.0–46.0)
Hemoglobin: 8 g/dL — ABNORMAL LOW (ref 12.0–15.0)

## 2018-05-06 LAB — PHOSPHORUS: Phosphorus: 3.1 mg/dL (ref 2.5–4.6)

## 2018-05-06 LAB — MAGNESIUM: Magnesium: 1.7 mg/dL (ref 1.7–2.4)

## 2018-05-06 LAB — CBC
HCT: 21.7 % — ABNORMAL LOW (ref 36.0–46.0)
Hemoglobin: 6.8 g/dL — CL (ref 12.0–15.0)
MCH: 28.8 pg (ref 26.0–34.0)
MCHC: 31.3 g/dL (ref 30.0–36.0)
MCV: 91.9 fL (ref 80.0–100.0)
Platelets: 627 10*3/uL — ABNORMAL HIGH (ref 150–400)
RBC: 2.36 MIL/uL — ABNORMAL LOW (ref 3.87–5.11)
RDW: 15.1 % (ref 11.5–15.5)
WBC: 13.8 10*3/uL — ABNORMAL HIGH (ref 4.0–10.5)
nRBC: 0.6 % — ABNORMAL HIGH (ref 0.0–0.2)

## 2018-05-06 LAB — PREPARE RBC (CROSSMATCH)

## 2018-05-06 SURGERY — ESOPHAGOGASTRODUODENOSCOPY (EGD) WITH PROPOFOL
Anesthesia: General

## 2018-05-06 MED ORDER — PROPOFOL 10 MG/ML IV BOLUS
INTRAVENOUS | Status: DC | PRN
  Administered 2018-05-06: 70 mg via INTRAVENOUS

## 2018-05-06 MED ORDER — ONDANSETRON HCL 4 MG/2ML IJ SOLN
INTRAMUSCULAR | Status: DC | PRN
  Administered 2018-05-06: 4 mg via INTRAVENOUS

## 2018-05-06 MED ORDER — PANTOPRAZOLE SODIUM 40 MG IV SOLR
40.0000 mg | Freq: Two times a day (BID) | INTRAVENOUS | Status: DC
  Administered 2018-05-06 – 2018-05-07 (×2): 40 mg via INTRAVENOUS
  Filled 2018-05-06 (×2): qty 40

## 2018-05-06 MED ORDER — SODIUM CHLORIDE 0.9% IV SOLUTION: Freq: Once | INTRAVENOUS | Status: DC

## 2018-05-06 MED ORDER — CHLORHEXIDINE GLUCONATE 0.12 % MT SOLN
OROMUCOSAL | Status: AC
  Administered 2018-05-06: 15 mL
  Filled 2018-05-06: qty 15

## 2018-05-06 MED ORDER — LACTATED RINGERS IV SOLN
INTRAVENOUS | Status: DC
  Administered 2018-05-06: 13:00:00 via INTRAVENOUS

## 2018-05-06 MED ORDER — SUCCINYLCHOLINE CHLORIDE 200 MG/10ML IV SOSY
PREFILLED_SYRINGE | INTRAVENOUS | Status: DC | PRN
  Administered 2018-05-06: 80 mg via INTRAVENOUS

## 2018-05-06 MED ORDER — PHENYLEPHRINE 40 MCG/ML (10ML) SYRINGE FOR IV PUSH (FOR BLOOD PRESSURE SUPPORT)
PREFILLED_SYRINGE | INTRAVENOUS | Status: DC | PRN
  Administered 2018-05-06: 80 ug via INTRAVENOUS

## 2018-05-06 MED ORDER — LIDOCAINE 2% (20 MG/ML) 5 ML SYRINGE
INTRAMUSCULAR | Status: DC | PRN
  Administered 2018-05-06: 80 mg via INTRAVENOUS

## 2018-05-06 SURGICAL SUPPLY — 14 items

## 2018-05-06 NOTE — Progress Notes (Addendum)
Family medicine interim progress note  AM hemoglobin reviewed. 6.8 from 7.0. Will give 1 uprbc as transfusion threshold is 7.0 based on existing medical problems.  Guadalupe Dawn MD PGY-2 Family Medicine Resident

## 2018-05-06 NOTE — Progress Notes (Signed)
Consulted vascular surgery who reviewed imaging and chart.  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  With fully patent SMA there would not be benefit to this procedure and they suspect poor vacularization would produce more diffuse indication of ischemia.  --Dr. Criss Rosales

## 2018-05-06 NOTE — Op Note (Signed)
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 Estimated Blood Loss:     Estimated blood loss was minimal. Procedure:                Pre-Anesthesia Assessment:                           - Prior to the procedure, a History and Physical                            was performed, and patient medications and                            allergies were reviewed. The patient's tolerance of                            previous anesthesia was also reviewed. The risks                            and benefits of the procedure and the sedation                            options and risks were discussed with the patient.                            All questions were answered, and informed consent                            was obtained. Prior Anticoagulants: The patient has                            taken no previous anticoagulant or antiplatelet  agents. ASA Grade Assessment: III - A patient with                            severe systemic disease. After reviewing the risks                            and benefits, the patient was deemed in                            satisfactory condition to undergo the procedure.                           After obtaining informed consent, the endoscope was                         passed under direct vision. Throughout the                            procedure, the patient's blood pressure, pulse, and                            oxygen saturations were monitored continuously. The                            GIF-H190 ([id number]) Olympus gastroscope was                            introduced through the mouth, and advanced to the                            afferent and efferent jejunal loops. The upper GI                            endoscopy was accomplished without difficulty. The                            patient tolerated the procedure well. 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 The       gastrojejunal anastomosis was characterized by ulceration. This was       traversed. The pouch-to-jejunum limb was characterized by healthy       appearing mucosa. The duodeno-jejunal anastomosis was characterized by       healthy appearing mucosa. Biopsies of ulceration were taken with a cold       forceps for histology. Verification of patient identification for the       specimen was done. Estimated blood loss was minimal.      LA Grade B (one or more mucosal breaks greater than 5 mm, not extending       between the tops of two mucosal folds) esophagitis with no bleeding was       found at the gastroesophageal junction.      The pouch was otherwise normal on retroflexion.      The exam was otherwise without abnormality. Impression:               -  Roux-en-Y gastrojejunostomy with gastrojejunal                            anastomosis characterized by ulceration. Biopsied.                           - LA Grade B reflux esophagitis.                           - The examination was otherwise normal. Recommendation:           - Return patient to hospital ward for ongoing care.                           - Clear liquid diet.                           - I TOOK BIOPSIES BUT THE APPEARANCE OF THE                             ULCERATION (SERPIGINOUS, SUPERFICIAL) LOOKS                            ISCHEMIC.NOT TOO CHRONIC IN APPEARANCE BUT WE KNOW                            THIS IS RECURRENT ISSUE                           ? IF COULD BE RELATED TO THE SMALL CALIBER CELIAC                            ARTERY SEEN ON CT ANGIO                           NEEDS SURGICAL F/U AND ? IF ANY RESTORATION OF                            BLOOD SUPPLY POSSIBLE FROM A VASCULAR INTERVENTION.                            DIFFICULT SITUATION                           I ALSO CONSULTED DIETITIAN AS SHE COULD NEED TPN OR                            PERHAPS EVEN A SURGICAL FEEDING JEJUNOSTOMY                            DEPENDING UPON WHAT HAPPENS                           I DON'T THINK A COLONOSCOPY IS NEEDED NOR WOULD IT  BE POSSIBLE RIGHT NOW Procedure Code(s):        --- Professional ---                           [phone], Esophagogastroduodenoscopy, flexible,                            transoral; with biopsy, single or multiple Diagnosis Code(s):        --- Professional ---                           Z98.0, Intestinal bypass and anastomosis status                           K21.0, Gastro-esophageal reflux disease with                            esophagitis                           R10.12, Left upper quadrant pain                           R19.5, Other fecal abnormalities CPT copyright 2019 [employer]. All rights reserved. The codes documented in this report are preliminary and upon coder review may  be revised to meet current compliance requirements. Gatha Mayer, MD 05/06/2018 1:50:39 PM This report has been signed electronically. Number of Addenda: 0

## 2018-05-06 NOTE — Anesthesia Preprocedure Evaluation (Addendum)
Anesthesia Evaluation  Patient identified by MRN, date of birth, ID band Patient awake    Reviewed: Allergy & Precautions, NPO status , Patient's Chart, lab work & pertinent test results  Airway Mallampati: II  TM Distance: >3 FB Neck ROM: Full    Dental no notable dental hx.    Pulmonary neg pulmonary ROS,    Pulmonary exam normal breath sounds clear to auscultation       Cardiovascular + Past MI and + Peripheral Vascular Disease  + dysrhythmias Atrial Fibrillation + pacemaker  Rhythm:Irregular Rate:Normal     Neuro/Psych negative neurological ROS  negative psych ROS   GI/Hepatic Neg liver ROS, PUD, GERD  ,  Endo/Other  negative endocrine ROS  Renal/GU negative Renal ROS  negative genitourinary   Musculoskeletal negative musculoskeletal ROS (+)   Abdominal   Peds negative pediatric ROS (+)  Hematology  (+) anemia ,   Anesthesia Other Findings   Reproductive/Obstetrics negative OB ROS                            Anesthesia Physical Anesthesia Plan  ASA: III and emergent  Anesthesia Plan: General   Post-op Pain Management:    Induction: Intravenous and Rapid sequence  PONV Risk Score and Plan:   Airway Management Planned:   Additional Equipment:   Intra-op Plan:   Post-operative Plan: Extubation in OR  Informed Consent: I have reviewed the patients History and Physical, chart, labs and discussed the procedure including the risks, benefits and alternatives for the proposed anesthesia with the patient or authorized representative who has indicated his/her understanding and acceptance.     Dental advisory given  Plan Discussed with: CRNA  Anesthesia Plan Comments:         Anesthesia Quick Evaluation

## 2018-05-06 NOTE — Progress Notes (Signed)
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  This in combination with her history of multiple recurring ulcers as well as CTA showing "severely diseased "celiac artery is concerning for vascular cause of her repeated issues.  Called general surgery to clarify whether this would fall under their jurisdiction or with vascular.  They suggested I call vascular surgery, who is now been consulted.  Dr. Criss Rosales

## 2018-05-06 NOTE — Anesthesia Procedure Notes (Signed)
Procedure Name: Intubation Date/Time: 05/06/2018 1:17 PM Performed by: Myna Bright, CRNA Pre-anesthesia Checklist: Patient identified, Emergency Drugs available, Suction available and Patient being monitored Patient Re-evaluated:Patient Re-evaluated prior to induction Oxygen Delivery Method: Circle system utilized Preoxygenation: Pre-oxygenation with 100% oxygen Induction Type: IV induction, Rapid sequence and Cricoid Pressure applied Laryngoscope Size: Mac and 3 Grade View: Grade I Tube type: Oral Tube size: 7.0 mm Number of attempts: 1 Airway Equipment and Method: Stylet Placement Confirmation: ETT inserted through vocal cords under direct vision,  positive ETCO2 and breath sounds checked- equal and bilateral Secured at: 20 cm Tube secured with: Tape Dental Injury: Teeth and Oropharynx as per pre-operative assessment

## 2018-05-06 NOTE — Consult Note (Addendum)
[facility] Consult: 9:10 AM 05/06/2018  LOS: 1 day    Referring Provider: Dr McDiarmid  Primary Care Physician:  Nuala Alpha, DO Primary Gastroenterologist:  Dr Loletha Carrow     Reason for Consultation:  Anemia.     HPI: Deanna Spencer is a 78 y.o. female.  Extensive past hx listed below.   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  Has required PRBCs for blood loss anemia.  S/p 2 separate surgical revisions to address anastomotic ulcers in 2017/2018.  Notes mention ulcer eroding into pancreas.   Medical mgt of GIB in 09/2016 with blood transfusion. EGD showed (? Duodenal) ulcer. Carafate added, repeat EGD showed ulcer healed.   Dehiscence of gastrojejunostomy, s/p 11/2016 ex lap, revision (3rd) of gastro-jejunostomy, eophagogastroscopy by Dr Romana Juniper in [place].  Endoscopy by Dr Lucia Gaskins showed viable gastric mucosa and anastomosis, no leak upon instillation of saline.   Pt recalls a colonoscopy within last 18 months, thinks this was in [place] (vs [place], she rotates living from [facility] to [place] to [place]).  On BID Protonix and Carafate AC/HS.  No longer on ASA.  S/p Watchman procedure for A fib, after which Coumadin discontinued.   Seen once by Dr Loletha Carrow 11/2016 for hx duodenal ulcer and need for hip surgery, assess risk for bleeding if AC added.  Scheduled EGD cxl'd when she developed the dehiscence and went to surgery.  Had THR in 02/2017.    She has been admitted to hospitals in [place] 3 times in the last 6 months for recurrences of severe abdominal pain, nausea and vomiting.  Admissions were 11/2017, 02/2018, 04/13/2018 - 04/18/2018 and averaged about a 6-day stay.  She was discharged from that third  hospitalization on 04/18/2018.  After that she returned to [facility] 4/10. During these hospitalization she underwent radiologic and upper endoscopic evaluation.  EGD 02/2018 showed gastric ulcer.  02/07/2018 cardiac cath: notes say no CAD, inferior wall akinesis, EF 45%, moderate mitral regurgitation.  Discharge diagnosis most recently included acute UTI, hypovolemic shock, malnutrition, anemia, sigmoid diverticulitis with resolution of diverticulitis on repeat CT.  Discharged to finish Cipro, flagyl but she stopped these due to N/V.  Continued Protonix 40 BID and Carafate qid.   Patient says she really was not tolerating a quick advance from clear liquids to solid diet when she left the hospital on 4/8.  She never was pain-free.  Since returning to [facility] she has had ongoing diffuse, persistent abdominal pain, to her awareness she feels this is not ulcer pain that it is "behind" the usual location of ulcer pain.  Also this pain is constant, whereas ulcer pain tends to ebb and flow.  She is vomiting mostly foamy white material, sometimes pale clear yellow material.  No blood or coffee grounds.  Has not had any diarrhea or formed stool in around 2 weeks.  Not been able to tolerate anything other than some water and milk.  Has not had  any access to antiemetics.  She has been taking Tylenol for the pain and this is not very effective  Hgb 8.3 >> 6.8 >> 1 U PRBC transfusing currently.  MCV 91. Hgb 04/18/2026 was 10.5.  AST 70, and low albumin, O/w normal LFTs.   Anemia studies c/w low iron, anemia of chronic dz though Ferritin is 418.  FOBT +.    + pyuria.  No urine clx.  Blood clx pending.   CT ab/pelvis without CM: stable post chole CBD dilation.  Sigmoid tics, 3.3 AAA.  Stable gastric bypass.    CT angio abd/pelvis: no evidence bowel ischemia.  3 CM infrarenal AAA.  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  Patent SMA and IMA.   Morphine is helping pain.        Past Medical  History:  Diagnosis Date   AAA (abdominal aortic aneurysm) ([place])    3.5cm by CT 11/29/16   AAA (abdominal aortic aneurysm) without rupture ([facility]) 12/21/2016   Stable 3cm 4/20, needs f/u imaging ~4/23   Acute abdominal pain 05/05/2018   Anemia    Atrial fibrillation ([place]) 11/22/2016   Bee sting 06/28/2017   Cerumen impaction 06/28/2017   Childhood asthma    connected to allergies   Chronic lower back pain    Chronic systolic heart failure ([place]) 12/14/2016   Diplopia 09/27/2017   Diverticulosis    Duodenal ulcer    Dyspnea    Dysrhythmia    a fib   GERD (gastroesophageal reflux disease)    GI bleed    "have had both upper and lower GIB"   Gout    Heart attack ([place]) 2017   Heart murmur    History of anastomosis and bypass hemorrhage 05/06/2018   History of blood transfusion 2017-2018   "related to bleeding ulcers; 47 units PRBC; 2U plasma" (11/23/2016)   History of duodenal ulcer 11/18/2016   History of heart attack 11/18/2016   History of hiatal hernia    Hx of blood clots    in heart   IBS (irritable bowel syndrome)    Liver lesion 08/07/2017   Neuropathy    Osteoarthritis    Osteoarthritis of right knee 05/30/2017   Osteoporosis    Other chronic pain 11/18/2016   Parasomnia 11/18/2016   Perforated viscus 11/22/2016   Peripheral neuropathy    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 s/p Medtronic PPM placement in 2017   Stomach ulcer     Vision changes 08/07/2017    Past Surgical History:  Procedure Laterality Date   ABDOMINAL HYSTERECTOMY     APPENDECTOMY     ARTHROPLASTY Right 06/02/2017   RIGHT PATELLA-FEMORAL ARTHROPLASTY, with placement of a cemented 29 mm Stryker asymmetric triathlon patellar button, revision of the tibial polyethylene bearing.   CARPAL TUNNEL RELEASE Bilateral    CATARACT EXTRACTION W/ INTRAOCULAR LENS  IMPLANT, BILATERAL Bilateral    CHOLECYSTECTOMY OPEN     DILATION AND CURETTAGE OF UTERUS  FOOT FUSION Right    INGUINAL HERNIA REPAIR Right X 2   INSERT / REPLACE / REMOVE PACEMAKER  2017   medtronic   JOINT REPLACEMENT     LAPAROTOMY N/A 11/21/2016   Procedure: EXPLORATORY LAPAROTOMY, REVISION OF  GASTROJEJUNOSTOMY, ESOPHAGOGASTROSCOPY;  Surgeon: Clovis Riley, MD;  Location: [facility];  Service: General;  Laterality: N/A;   LEFT ATRIAL APPENDAGE OCCLUSION  2018   placed  via groin   LUMBAR FUSION  X 2   PATELLA-FEMORAL ARTHROPLASTY Right 06/02/2017   Procedure: RIGHT PATELLA-FEMORAL ARTHROPLASTY;  Surgeon: Frederik Pear, MD;  Location: [facility];  Service: Orthopedics;  Laterality: Right;   REVERSE SHOULDER ARTHROPLASTY Left    reverse shoulder replacement   ROUX-EN-Y GASTRIC BYPASS  2005   ROUX-EN-Y PROCEDURE  20017 X 2   "revisions"   TONSILLECTOMY AND ADENOIDECTOMY     TOTAL HIP ARTHROPLASTY Right 02/24/2017   Procedure: TOTAL HIP ARTHROPLASTY;  Surgeon: Earlie Server, MD;  Location: [facility];  Service: Orthopedics;  Laterality: Right;   TOTAL KNEE ARTHROPLASTY Bilateral    TOTAL SHOULDER ARTHROPLASTY Bilateral    TUBAL LIGATION      Prior to Admission medications   Medication Sig Start Date End Date Taking? Authorizing Provider  acetaminophen (TYLENOL) 500 MG tablet Take 1,000 mg every 6 (six) hours as needed by mouth (pain).    Yes [provider]  Ensure (ENSURE) Take 237 mLs by mouth daily as needed (meal supplement).   Yes [provider]    gabapentin (NEURONTIN) 300 MG capsule TAKE 2 CAPSULEs(600 MG) BY MOUTH THREE TIMES DAILY Patient taking differently: Take 600 mg by mouth 3 (three) times daily.  11/16/17  Yes Lockamy, Timothy, DO  losartan (COZAAR) 25 MG tablet Take 25 mg by mouth daily.  02/26/18  Yes [provider]  metoprolol tartrate (LOPRESSOR) 50 MG tablet Take 50 mg by mouth 2 (two) times a day. 02/26/18  Yes [provider]  pantoprazole (PROTONIX) 40 MG tablet Take 40 mg by mouth 2 (two) times a day. 01/27/18  Yes [provider]  rOPINIRole (REQUIP) 0.5 MG tablet TAKE 1 TABLET BY MOUTH THREE TIMES DAILY Patient taking differently: Take 0.5-1 mg by mouth See admin instructions. Take one tablet (0.5 mg) by mouth every afternoon and two tablets (1 mg) at night - for restless legs 03/19/18  Yes Lockamy, Timothy, DO  sucralfate (CARAFATE) 1 g tablet TAKE 1 TABLET(1 GRAM) BY MOUTH FOUR TIMES DAILY AT BEDTIME WITH MEALS Patient taking differently: Take 1 g by mouth 4 (four) times daily.  03/05/18  Yes Lockamy, Timothy, DO  TOVIAZ 4 MG TB24 tablet TAKE 1 TABLET(4 MG) BY MOUTH DAILY Patient taking differently: Take 4 mg by mouth daily.  12/13/17  Yes Nuala Alpha, DO  bismuth subsalicylate (PEPTO-BISMOL) 262 MG chewable tablet Chew 2 tablets (524 mg total) by mouth as needed. Can take up to 8 tablets total per day. Wait one hour between doses. Patient not taking: Reported on 05/05/2018 08/22/17   Nuala Alpha, DO  colchicine 0.6 MG tablet Take 1 tablet (0.6 mg total) by mouth 2 (two) times daily as needed (gout attacks). Patient not taking: Reported on 05/05/2018 04/18/17   Nuala Alpha, DO  diclofenac (VOLTAREN) 75 MG EC tablet TAKE 1 TABLET(75 MG) BY MOUTH TWICE DAILY Patient not taking: Reported on 05/05/2018 09/25/17   Nuala Alpha, DO  ferrous sulfate 325 (65 FE) MG tablet You can resume this in a couple weeks after your feeling better. Patient  not taking: Reported on 05/05/2018 11/30/16    Earnstine Regal, PA-C  loratadine (CLARITIN) 10 MG tablet Take 1 tablet (10 mg total) by mouth daily. Patient not taking: Reported on 05/05/2018 07/31/17   Nuala Alpha, DO  Melatonin 1 MG CAPS Take 1 capsule (1 mg total) by mouth at bedtime. Patient not taking: Reported on 05/05/2018 09/22/17   Nuala Alpha, DO  oxyCODONE-acetaminophen (PERCOCET/ROXICET) 5-325 MG tablet Take 1 tablet by mouth every 4 (four) hours as needed for severe pain. Patient not taking: Reported on 05/05/2018 06/02/17   Leighton Parody, PA-C  rOPINIRole (REQUIP) 0.5 MG tablet TAKE 1 TABLET BY MOUTH THREE TIMES DAILY Patient not taking: Reported on 05/05/2018 08/21/17   Nuala Alpha, DO  tiZANidine (ZANAFLEX) 2 MG tablet Take 1 tablet (2 mg total) by mouth every 6 (six) hours as needed. Patient not taking: Reported on 05/05/2018 12/14/17   Nuala Alpha, DO    Scheduled Meds:  sodium chloride   Intravenous Once   fesoterodine  4 mg Oral Daily   metoprolol tartrate  50 mg Oral BID   pantoprazole  40 mg Oral BID   rOPINIRole  0.5 mg Oral QPC lunch   rOPINIRole  1 mg Oral QHS   sodium chloride flush  3 mL Intravenous Q12H   sucralfate  1 g Oral QID   Infusions:  sodium chloride 50 mL/hr at 05/05/18 2321   piperacillin-tazobactam (ZOSYN)  IV 3.375 g (05/05/18 2322)   PRN Meds: morphine injection, sodium chloride flush   Allergies as of 05/05/2018 - Review Complete 05/05/2018  Allergen Reaction Noted   Demerol [meperidine] Anaphylaxis 11/15/2016   Nsaids  06/02/2017    Family History  Problem Relation Age of Onset   Heart disease Mother    Liver disease Father    Ulcers Father    Prostate cancer Brother    Colon cancer Neg Hx     Social History   Socioeconomic History   Marital status: Widowed    Spouse name: Not on file   Number of children: Not on file   Years of education: Not on file   Highest education level: Not on file  Occupational History   Not on file    Social Needs   Financial resource strain: Not on file   Food insecurity:    Worry: Not on file    Inability: Not on file   Transportation needs:    Medical: Not on file    Non-medical: Not on file  Tobacco Use   Smoking status: Never Smoker   Smokeless tobacco: Never Used  Substance and Sexual Activity   Alcohol use: No    Frequency: Never    Comment: very rare   Drug use: No   Sexual activity: Not Currently  Lifestyle   Physical activity:    Days per week: Not on file    Minutes per session: Not on file   Stress: Not on file  Relationships   Social connections:    Talks on phone: Not on file    Gets together: Not on file    Attends religious service: Not on file    Active member of club or organization: Not on file    Attends meetings of clubs or organizations: Not on file    Relationship status: Not on file   Intimate partner violence:    Fear of current or ex partner: Not on file    Emotionally abused: Not on file    Physically abused: Not  on file    Forced sexual activity: Not on file  Other Topics Concern   Not on file  Social History Narrative   Not on file    REVIEW OF SYSTEMS: Constitutional: Weakness. ENT:  No nose bleeds Pulm: No shortness of breath or cough. CV:  No palpitations, no LE edema.  No exertional chest pain. GU:  No hematuria, no frequency GI: See HPI. Heme: Has not seen any bleeding from her skin, orifices.  No excessive bruising. Transfusions: Had transfusions in the past but none in the last few years. Neuro:  No headaches, no peripheral tingling or numbness Derm:  No itching, no rash or sores.  Endocrine:  No sweats or chills.  No polyuria or dysuria Immunization: Reviewed the record.  She had a flu shot in 11/2016. Travel:  None beyond local counties in last few months.    PHYSICAL EXAM: Vital signs in last 24 hours: Vitals:   05/06/18 0817 05/06/18 0859  BP: 93/64 (!) 91/50  Pulse: 81 99  Resp: 19 20  Temp:  98.1 F (36.7 C) 98.7 F (37.1 C)  SpO2: (!) 86%    Wt Readings from Last 3 Encounters:  05/05/18 55.3 kg  09/22/17 61.6 kg  07/31/17 58.7 kg    General: Thin, chronically ill, slightly anxious WF Head: No facial asymmetry or swelling.  No signs of head trauma. Eyes: No scleral icterus.  No conjunctival pallor. Ears: My. Nose: No congestion, no discharge. Mouth: Oropharynx moist, pink, clear.  Tongue midline. Neck: No JVD, no masses, no thyromegaly. Lungs: Clear bilaterally.  No labored breathing. Heart: RRR.  No MRG.  S1, S2 present. Abdomen: Tender throughout without guarding or rebound.  No tinkling or tympanitic bowel sounds though normal quality bowel sounds are scant.  No HSM, masses, or bruits..   Rectal: Deferred Musc/Skeltl: No joint redness, swelling or gross deformity. Extremities: No CCE.  Limbs thin. Neurologic: Oriented x3.  Moves all 4 limbs, no tremor.  Strength not tested.  No gross neurologic deficits. Skin: No rashes, sores, suspicious lesions or significant bruising. Tattoos: None observed. Nodes: No cervical or inguinal adenopathy. Psych: Not anxious, cooperative, fluid speech.  LAB RESULTS: Recent Labs    05/05/18 1130 05/05/18 2154 05/06/18 0338  WBC 16.4* 15.1* 13.8*  HGB 8.3* 7.0* 6.8*  HCT 26.8* 22.1* 21.7*  PLT 860* 681* 627*   BMET Lab Results  Component Value Date   NA 140 05/06/2018   NA 137 05/05/2018   NA 144 07/31/2017   K 3.5 05/06/2018   K 3.4 (L) 05/05/2018   K 4.9 07/31/2017   CL 107 05/06/2018   CL 103 05/05/2018   CL 106 07/31/2017   CO2 22 05/06/2018   CO2 17 (L) 05/05/2018   CO2 21 07/31/2017   GLUCOSE 92 05/06/2018   GLUCOSE 141 (H) 05/05/2018   GLUCOSE 86 07/31/2017   BUN 20 05/06/2018   BUN 25 (H) 05/05/2018   BUN 20 07/31/2017   CREATININE 0.78 05/06/2018   CREATININE 0.78 05/05/2018   CREATININE 0.99 07/31/2017   CALCIUM 8.5 (L) 05/06/2018   CALCIUM 9.1 05/05/2018   CALCIUM 9.6 07/31/2017   LFT Recent  Labs    05/05/18 1326  PROT 6.5  ALBUMIN 2.8*  AST 29  ALT 70*  ALKPHOS 70  BILITOT 0.7   PT/INR Lab Results  Component Value Date   INR 0.95 05/29/2017   INR 1.00 02/10/2017   INR 0.88 11/21/2016   Lipase     Component  Value Date/Time   LIPASE 27 05/05/2018 1326     RADIOLOGY STUDIES: Ct Abdomen Pelvis Wo Contrast  Result Date: 05/05/2018 CLINICAL DATA:  Chronic lower abdominal pain. EXAM: CT ABDOMEN AND PELVIS WITHOUT CONTRAST TECHNIQUE: Multidetector CT imaging of the abdomen and pelvis was performed following the standard protocol without IV contrast. COMPARISON:  CT of the abdomen and pelvis with contrast on 08/08/2017 FINDINGS: Lower chest: No acute abnormality. Hepatobiliary: Stable dilatation of bile ducts after prior cholecystectomy. Unenhanced appearance of the liver is unremarkable. Pancreas: Unremarkable. No pancreatic ductal dilatation or surrounding inflammatory changes. Spleen: Normal in size without focal abnormality. Adrenals/Urinary Tract: No adrenal masses. Stable cortical scarring of both kidneys. No hydronephrosis or renal calculi identified. The bladder appears unremarkable. Stomach/Bowel: Stable appearance of prior gastric bypass. No evidence of bowel obstruction, ileus or free intraperitoneal air. Stable diverticulosis of the sigmoid colon without evidence by CT of acute diverticulitis. Vascular/Lymphatic: Stable mild aneurysmal dilatation of the infrarenal abdominal aorta measuring 3.3 cm in greatest diameter. No evidence of rupture. No enlarged lymph nodes identified. Reproductive: Status post hysterectomy. No adnexal masses. Other: No abdominal wall hernia or abnormality. No abdominopelvic ascites. Musculoskeletal: Stable appearance of spine after prior posterior lumbar fusion at L3-4. No fractures or bony lesions identified. IMPRESSION: 1. No acute findings in the abdomen or pelvis. 2. Stable dilatation of bile ducts after prior cholecystectomy. 3. Stable  diverticulosis of the sigmoid colon without evidence of acute diverticulitis. 4. Stable mild aneurysmal disease of the abdominal aorta measuring 3.3 cm. 5. Stable appearance of prior gastric bypass. Electronically Signed   By: Aletta Edouard M.D.   On: 05/05/2018 15:41   Dg Chest 2 View  Addendum Date: 05/05/2018   ADDENDUM REPORT: 05/05/2018 21:33 ADDENDUM: Right PICC line has been placed with the tip in the SVC. Electronically Signed   By: Rolm Baptise M.D.   On: 05/05/2018 21:33   Result Date: 05/05/2018 CLINICAL DATA:  Abdominal pain EXAM: CHEST - 2 VIEW COMPARISON:  02/24/2017 FINDINGS: Left pacer remains in place, unchanged. Cardiomegaly. Lungs clear. No effusions. No acute bony abnormality. Prior bilateral shoulder replacements. IMPRESSION: Cardiomegaly.  No acute findings. Electronically Signed: By: Rolm Baptise M.D. On: 05/05/2018 21:14   Dg Abd 2 Views  Result Date: 05/05/2018 CLINICAL DATA:  Abdominal pain EXAM: ABDOMEN - 2 VIEW COMPARISON:  CT 05/05/2018 FINDINGS: Nonobstructive bowel gas pattern. No organomegaly or free air. Mild cardiomegaly. Pacer wires noted in the right heart. Lung bases clear. IMPRESSION: No obstruction or free air. Electronically Signed   By: Rolm Baptise M.D.   On: 05/05/2018 21:14   [place] Ekg Site Rite  Result Date: 05/05/2018 If Site Rite image not attached, placement could not be confirmed due to current cardiac rhythm.  Ct Angio Abd/pel W/ And/or W/o  Result Date: 05/05/2018 CLINICAL DATA:  Abdominal pain.  Concern for ischemia. EXAM: CTA ABDOMEN AND PELVIS wITHOUT AND WITH CONTRAST TECHNIQUE: Multidetector CT imaging of the abdomen and pelvis was performed using the standard protocol during bolus administration of intravenous contrast. Multiplanar reconstructed images and MIPs were obtained and reviewed to evaluate the vascular anatomy. CONTRAST:  167mL OMNIPAQUE IOHEXOL 350 MG/ML SOLN COMPARISON:  05/05/2018 FINDINGS: VASCULAR Aorta: 3 cm infrarenal  abdominal aortic aneurysm.  Atherosclerosis. Celiac: Very small in caliber. The splenic artery appears to possibly fill via collaterals. SMA: Patent without evidence of aneurysm, dissection, vasculitis or significant stenosis. Renals: 2 small left renal arteries and 1 right renal artery. No stenosis in the right renal  artery. IMA: Patent Inflow: Iliac atherosclerosis. No aneurysm or stenosis. No dissection. Proximal Outflow: Grossly unremarkable. Veins: No obvious venous abnormality within the limitations of this arterial phase study. Review of the MIP images confirms the above findings. NON-VASCULAR Lower chest: Cardiomegaly. Pacer wires in the right heart. Posterior left diaphragmatic hernia containing fat. Lung bases clear. No effusions. Hepatobiliary: Prior cholecystectomy.  No focal hepatic abnormality. Pancreas: No focal abnormality or ductal dilatation. Spleen: No focal abnormality.  Normal size. Adrenals/Urinary Tract: Area of cortical thinning and scarring in the anterior left kidney and upper pole of the left kidney. Lobular contours of the right kidney. No hydronephrosis. Adrenal glands and urinary bladder unremarkable. Stomach/Bowel: Postoperative changes in the stomach and proximal small bowel. No visible complicating feature. Scattered colonic diverticulosis, most pronounced in the descending and sigmoid colon. No active diverticulitis. Small bowel decompressed. No evidence of bowel obstruction. Lymphatic: No adenopathy Reproductive: Prior hysterectomy.  No adnexal masses. Other: No free fluid or free air. Musculoskeletal: Prior right hip replacement. Posterior fusion changes in the lumbar spine. No acute bony abnormality. IMPRESSION: VASCULAR Calcified aorta. Infrarenal abdominal aortic aneurysm, 3 cm. Recommend followup by ultrasound in 3 years. This recommendation follows ACR consensus guidelines: White Paper of the ACR Incidental Findings Committee II on Vascular Findings. J Am Coll Radiol 2013;  10:789-794. 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 SMA and IMA appear widely patent. NON-VASCULAR Prior cholecystectomy and hysterectomy. Colonic diverticulosis. No active diverticulitis. No visible bowel changes to suggest ischemia. Electronically Signed   By: Rolm Baptise M.D.   On: 05/05/2018 22:06     IMPRESSION:   *   Persistent abdominal pain and non-bloody n/v.   Sigmoid diverticulitis early 04/2018, confirmed resolved by repeat CT scan 3 weeks ago.  Current CT shows sigmoid diverticulosis, no diverticulitis.  *     Gastric ulcer on EGD 02/2018. Hx of same.  On chronic PPI and Carafate.    *     Anemia.  FOBT +.  hgb 6.8 down from 10.5 3 weeks ago.  1 U PRBC transfusing now.   *     Hx chronic, recurrent GI bleeding from both anastomotic and gastric ulcers.    *     Roux-en-Y bariatric surgery ~ 2005.   3 subsequent surgeries for anastomotic revision, the latest was 11/2016 for gastrojejunostomy dehiscence.     PLAN:     *  ? Repeat EGD.   Switch to IV PPI BID.  Stopped Zosyn as no signs "intra-abdominal" infection.      Azucena Freed  05/06/2018, 9:10 AM Phone [place] Attending   I have taken an interval history, reviewed the chart and examined the patient. I agree with the Advanced Practitioner's note, impression and recommendations.    Complicated patient - I think we need to do an EGD to see why she cannot tolerate adequate po first. Once we know the status of upper GI tract will plan next steps. The risks and benefits as well as alternatives of endoscopic procedure(s) have been discussed and reviewed. All questions answered. The patient agrees to proceed.  Gatha Mayer, MD, [facility] 05/06/2018 12:27 PM Pager [phone]

## 2018-05-06 NOTE — Progress Notes (Signed)
Progress Note: General Surgery Service   Assessment/Plan: Principal Problem:   Acute abdominal pain Active Problems:   Other chronic pain   Atrial fibrillation (HCC)   Chronic systolic heart failure (HCC)  Chronic abdominal pain- patient states it is different pain in nature from marginal ulcer though last EGD did find ulcer a few months ago. 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 G J with antecolic roux limb appears normal on CT though lack of oral contrast is limiting, JJ in appropriate position without evidence of mesenteric twisting -on double strength PPI and carafate for possibility of ulcer -needs to be on life long acid suppression given history   LOS: 1 day  Chief Complaint/Subjective: Pain is mostly left sided and (behind stomach), also sometimes to moves to right side, feels like it is different from previous ulcer pains, no bowel movement in 2 weeks, no nausea currently  Objective: Vital signs in last 24 hours: Temp:  [97.5 F (36.4 C)-98.5 F (36.9 C)] 98.1 F (36.7 C) (04/26 0817) Pulse Rate:  [79-137] 81 (04/26 0817) Resp:  [14-24] 19 (04/26 0817) BP: (83-132)/(54-89) 93/64 (04/26 0817) SpO2:  [85 %-100 %] 86 % (04/26 0817) Weight:  [55.3 kg] 55.3 kg (04/25 1051) Last BM Date: (2 weeks prior)  Intake/Output from previous day: 04/25 0701 - 04/26 0700 In: 500 [IV Piggyback:500] Out: 450 [Urine:450] Intake/Output this shift: No intake/output data recorded.  Lungs: nonlabored  Cardiovascular: RRR  Abd: soft, some pain on palpation in left lower quadrant, no guarding or rebound  Extremities: no edema  Neuro: AOx4  Lab Results: CBC  Recent Labs    05/05/18 2154 05/06/18 0338  WBC 15.1* 13.8*  HGB 7.0* 6.8*  HCT  22.1* 21.7*  PLT 681* 627*   BMET Recent Labs    05/05/18 1326 05/06/18 0338  NA 137 140  K 3.4* 3.5  CL 103 107  CO2 17* 22  GLUCOSE 141* 92  BUN 25* 20  CREATININE 0.78 0.78  CALCIUM 9.1 8.5*   PT/INR No results for input(s): LABPROT, INR in the last 72 hours. ABG No results for input(s): PHART, HCO3 in the last 72 hours.  Invalid input(s): PCO2, PO2  Studies/Results:  Anti-infectives: Anti-infectives (From admission, onward)   Start     Dose/Rate Route Frequency Ordered Stop   05/06/18 0300  piperacillin-tazobactam (ZOSYN) IVPB 3.375 g  Status:  Discontinued     3.375 g 12.5 mL/hr over 240 Minutes Intravenous Every 8 hours 05/05/18 1809 05/05/18 1810   05/05/18 1900  piperacillin-tazobactam (ZOSYN) IVPB 3.375 g  Status:  Discontinued     3.375 g 100 mL/hr over 30 Minutes Intravenous  Once 05/05/18 1809 05/05/18 1809   05/05/18 1900  piperacillin-tazobactam (ZOSYN) IVPB 3.375 g     3.375 g 12.5 mL/hr over 240 Minutes Intravenous Every 8 hours 05/05/18 1810        Medications: Scheduled Meds: . sodium chloride   Intravenous Once  . fesoterodine  4 mg Oral Daily  . metoprolol tartrate  50 mg Oral BID  . pantoprazole  40 mg Oral BID  . rOPINIRole  0.5 mg Oral QPC lunch  . rOPINIRole  1 mg Oral QHS  . sodium chloride flush  3 mL Intravenous Q12H  . sucralfate  1 g Oral QID   Continuous Infusions: . sodium chloride 50 mL/hr at 05/05/18 2321  . piperacillin-tazobactam (ZOSYN)  IV 3.375 g (05/05/18 2322)   PRN Meds:.morphine injection, sodium chloride flush  Mickeal Skinner, MD [facility]

## 2018-05-06 NOTE — Transfer of Care (Signed)
Immediate Anesthesia Transfer of Care Note  Patient: Deanna Spencer  Procedure(s) Performed: ESOPHAGOGASTRODUODENOSCOPY (EGD) WITH PROPOFOL (N/A ) BIOPSY  Patient Location: Endoscopy Unit  Anesthesia Type:General  Level of Consciousness: awake, alert , oriented and patient cooperative  Airway & Oxygen Therapy: Patient Spontanous Breathing and Patient connected to nasal cannula oxygen  Post-op Assessment: Report given to RN, Post -op Vital signs reviewed and stable and Patient moving all extremities  Post vital signs: Reviewed and stable  Last Vitals:  Vitals Value Taken Time  BP    Temp    Pulse    Resp    SpO2      Last Pain:  Vitals:   05/06/18 1248  TempSrc: Oral  PainSc: 2       Patients Stated Pain Goal: 0 (28/11/88 6773)  Complications: No apparent anesthesia complications

## 2018-05-06 NOTE — Discharge Summary (Signed)
[facility] Discharge Summary  Patient name: Deanna Spencer record number: [id number] Date of birth: May 07, 1940 Age: 78 y.o. Gender: female Date of Admission: 05/05/2018  Date of Discharge: 05/14/2018 Admitting Physician: Martyn Malay, MD  Primary Care Provider: Nuala Alpha, DO Consultants: GI, surgery, cards   Indication for Hospitalization: Acute on chronic abdominal pain  Discharge Diagnoses/Problem List:  Acute on chronic abdominal pain, improving Anastomosis gastrojejunal ulcer, ?  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 Slight edema to L wrist with swelling and mild erythema to left PIP joints. Minimal tenderness to palpation.     Brief Hospital Course:  Ms. 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   Acute on chronic abdominal pain: CT abdomen negative for acute intra-abdominal process, CTA without evidence of ischemic bowel.  GI consulted given concurrent anemia and prolonged GI history, performed EGD showing gastrojejunal ulcer at anastomosis with ischemic appearance.  Her original bariatric surgeon in [place], Dr. Dorthula Rue, and our general surgery team were  consulted, who recommended potential reversal of bypass via tertiary center if she is unable to tolerate symptomatic treatments.  During her stay, she was started on a PPI BID, Pepcid 40 mg daily, misoprostol TID, and Carafate slurry for symptomatic relief.  She was maintained on a full liquid to soft diet for the majority of her stay.  On day of discharge, she was hemodynamically stable and with minimal abdominal pain remaining.  While she is tolerating symptomatic treatment, she was set up with a [facility] bariatric surgeon, Dr. Vicenta Aly, to discuss further interventions moving forward.  Normocytic anemia: Hgb 6.8 on admit.  Received 1U pRBCs and feraheme IV x2 during her stay.  Likely multifactorial sign of iron deficiency, anemia of chronic disease, and likely some component of indolent blood loss via ulcers. Hgb 7.3 on morning of 5/2, remained stable without signs of bleeding. No labs obtained after 5/2.   Atrial Fibrillation:  Went into RVR on 4/28 after her metoprolol was held.  Cardiology consulted, ultimately added low-dose digoxin (does not need a level followed) and transitioned to metoprolol to 3 times daily dosing.  At discharge, HR 60-80s.  Issues for Follow Up:  1.  Ensure follow-up with tertiary care center for discussion on possible reversal of bypass, set follow-up for her with Dr. Vicenta Aly, [facility] bariatric surgery, prior to discharge.  2.  Please follow-up with a CBC, hemoglobin stable on discharge at 7.3.  Additionally recommend a patient may need IV iron supplementation long-term given poor iron absorption without duodenum. 3.  Noted a stable appearing 3 cm AAA on CT, will need follow-up imaging in 3 years. 4.  Ensure follow-up with cardiology for atrial fibrillation as discussed above, discharged on no anticoagulation, however has not been on long term due to significiant GI history w/ bleeding.  Significant Procedures:  05/06/18 - 1U PRBCs  4/26 - EGD :  Evidence of a revised Roux-en-Y  gastrojejunostomy was found. The gastrojejunal anastomosis was characterized by ulceration. This was traversed. The pouch-to-jejunum limb was characterized by healthy appearing mucosa. The duodeno-jejunal anastomosis was characterized by healthy appearing mucosa. Biopsies of ulceration were taken with a cold forceps for histology. Verification of patient identification for the specimen was done. Estimated blood loss was minimal. Findings: LA Grade B (one or more mucosal breaks greater than 5 mm, not extending between the tops of two mucosal folds) esophagitis with no bleeding was found at the gastroesophageal junction. The pouch was otherwise normal on retroflexion. The exam was otherwise without abnormality.  Significant Labs and Imaging:  Recent Labs  Lab 05/10/18 1431 05/11/18 0519 05/12/18 0420  WBC 6.7 6.7 5.7  HGB 7.3* 7.1* 7.3*  HCT 23.5* 22.6* 23.5*  PLT 445* 426* 425*   Recent Labs  Lab 05/09/18 0329 05/09/18 1737 05/10/18 0406 05/11/18 0519 05/12/18 0420  NA 138 137 140 139 140  K 4.0 4.0 4.0 3.7 3.4*  CL 101 102 103 99 103  CO2 27 26 26 29 28   GLUCOSE 101* 102* 88 87 91  BUN 12 11 10 9  7*  CREATININE 0.78 0.86 0.83 0.91 0.90  CALCIUM 9.8 9.8 9.7 9.9 9.3  MG  --   --   --  1.7  --   ALKPHOS  --  87 67 69 62  AST  --  26 19 18 16   ALT  --  31 26 21 19   ALBUMIN  --  2.7* 2.4* 2.4* 2.4*    Ct Abdomen Pelvis Wo Contrast  Result Date: 05/05/2018 CLINICAL DATA:  Chronic lower abdominal pain. EXAM: CT ABDOMEN AND PELVIS WITHOUT CONTRAST TECHNIQUE: Multidetector CT imaging of the abdomen and pelvis was performed following the standard protocol without IV contrast. COMPARISON:  CT of the abdomen and pelvis with contrast on 08/08/2017 FINDINGS: Lower chest: No acute abnormality. Hepatobiliary: Stable dilatation of bile ducts after prior cholecystectomy. Unenhanced appearance of the liver is unremarkable. Pancreas: Unremarkable. No pancreatic ductal dilatation or  surrounding inflammatory changes. Spleen: Normal in size without focal abnormality. Adrenals/Urinary Tract: No adrenal masses. Stable cortical scarring of both kidneys. No hydronephrosis or renal calculi identified. The bladder appears unremarkable. Stomach/Bowel: Stable appearance of prior gastric bypass. No evidence of bowel obstruction, ileus or free intraperitoneal air. Stable diverticulosis of the sigmoid colon without evidence by CT of acute diverticulitis. Vascular/Lymphatic: Stable mild aneurysmal dilatation of the infrarenal abdominal aorta measuring 3.3 cm in greatest diameter. No evidence of rupture. No enlarged lymph nodes identified. Reproductive: Status post hysterectomy. No adnexal masses. Other: No abdominal wall hernia or abnormality. No abdominopelvic ascites. Musculoskeletal: Stable appearance of spine after prior posterior lumbar fusion at L3-4. No fractures or bony lesions identified. IMPRESSION: 1. No acute findings in the abdomen or pelvis. 2. Stable dilatation of bile ducts after prior cholecystectomy. 3. Stable diverticulosis of the sigmoid colon without evidence of acute diverticulitis. 4. Stable mild aneurysmal disease of the abdominal aorta measuring 3.3 cm. 5. Stable appearance of prior gastric bypass. Electronically Signed   By: Aletta Edouard M.D.   On: 05/05/2018 15:41   Dg Chest 2 View  Addendum Date: 05/05/2018   ADDENDUM REPORT: 05/05/2018 21:33 ADDENDUM: Right PICC line has been placed with the tip in the SVC. Electronically Signed   By: Rolm Baptise M.D.   On: 05/05/2018 21:33   Result Date: 05/05/2018 CLINICAL DATA:  Abdominal pain  EXAM: CHEST - 2 VIEW COMPARISON:  02/24/2017 FINDINGS: Left pacer remains in place, unchanged. Cardiomegaly. Lungs clear. No effusions. No acute bony abnormality. Prior bilateral shoulder replacements. IMPRESSION: Cardiomegaly.  No acute findings. Electronically Signed: By: Rolm Baptise M.D. On: 05/05/2018 21:14   Dg Abd 2 Views  Result  Date: 05/05/2018 CLINICAL DATA:  Abdominal pain EXAM: ABDOMEN - 2 VIEW COMPARISON:  CT 05/05/2018 FINDINGS: Nonobstructive bowel gas pattern. No organomegaly or free air. Mild cardiomegaly. Pacer wires noted in the right heart. Lung bases clear. IMPRESSION: No obstruction or free air. Electronically Signed   By: Rolm Baptise M.D.   On: 05/05/2018 21:14   [place] Ekg Site Rite  Result Date: 05/05/2018 If Site Rite image not attached, placement could not be confirmed due to current cardiac rhythm.  Ct Angio Abd/pel W/ And/or W/o  Result Date: 05/05/2018 CLINICAL DATA:  Abdominal pain.  Concern for ischemia. EXAM: CTA ABDOMEN AND PELVIS wITHOUT AND WITH CONTRAST TECHNIQUE: Multidetector CT imaging of the abdomen and pelvis was performed using the standard protocol during bolus administration of intravenous contrast. Multiplanar reconstructed images and MIPs were obtained and reviewed to evaluate the vascular anatomy. CONTRAST:  152mL OMNIPAQUE IOHEXOL 350 MG/ML SOLN COMPARISON:  05/05/2018 FINDINGS: VASCULAR Aorta: 3 cm infrarenal abdominal aortic aneurysm.  Atherosclerosis. Celiac: Very small in caliber. The splenic artery appears to possibly fill via collaterals. SMA: Patent without evidence of aneurysm, dissection, vasculitis or significant stenosis. Renals: 2 small left renal arteries and 1 right renal artery. No stenosis in the right renal artery. IMA: Patent Inflow: Iliac atherosclerosis. No aneurysm or stenosis. No dissection. Proximal Outflow: Grossly unremarkable. Veins: No obvious venous abnormality within the limitations of this arterial phase study. Review of the MIP images confirms the above findings. NON-VASCULAR Lower chest: Cardiomegaly. Pacer wires in the right heart. Posterior left diaphragmatic hernia containing fat. Lung bases clear. No effusions. Hepatobiliary: Prior cholecystectomy.  No focal hepatic abnormality. Pancreas: No focal abnormality or ductal dilatation. Spleen: No focal abnormality.   Normal size. Adrenals/Urinary Tract: Area of cortical thinning and scarring in the anterior left kidney and upper pole of the left kidney. Lobular contours of the right kidney. No hydronephrosis. Adrenal glands and urinary bladder unremarkable. Stomach/Bowel: Postoperative changes in the stomach and proximal small bowel. No visible complicating feature. Scattered colonic diverticulosis, most pronounced in the descending and sigmoid colon. No active diverticulitis. Small bowel decompressed. No evidence of bowel obstruction. Lymphatic: No adenopathy Reproductive: Prior hysterectomy.  No adnexal masses. Other: No free fluid or free air. Musculoskeletal: Prior right hip replacement. Posterior fusion changes in the lumbar spine. No acute bony abnormality. IMPRESSION: VASCULAR Calcified aorta. Infrarenal abdominal aortic aneurysm, 3 cm. Recommend followup by ultrasound in 3 years. This recommendation follows ACR consensus guidelines: White Paper of the ACR Incidental Findings Committee II on Vascular Findings. J Am Coll Radiol 2013; 10:789-794. 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 SMA and IMA appear widely patent. NON-VASCULAR Prior cholecystectomy and hysterectomy. Colonic diverticulosis. No active diverticulitis. No visible bowel changes to suggest ischemia. Electronically Signed   By: Rolm Baptise M.D.   On: 05/05/2018 22:06    Results/Tests Pending at Time of Discharge:  Unresulted Labs (From admission, onward)   None       Discharge Medications:  Allergies as of 05/14/2018      Reactions   Demerol [meperidine] Anaphylaxis   Nsaids    Patient has had MULTIPLE revisions of her gastrojejunostomy for marginal ulcers  including recent perforation      Medication List    STOP taking these medications   bismuth subsalicylate 654 MG chewable tablet Commonly known as:  Pepto-Bismol   diclofenac 75 MG EC tablet Commonly known as:  VOLTAREN    ferrous sulfate 325 (65 FE) MG tablet   loratadine 10 MG tablet Commonly known as:  CLARITIN   losartan 25 MG tablet Commonly known as:  COZAAR   Melatonin 1 MG Caps   oxyCODONE-acetaminophen 5-325 MG tablet Commonly known as:  PERCOCET/ROXICET   sucralfate 1 g tablet Commonly known as:  CARAFATE Replaced by:  sucralfate 1 GM/10ML suspension   tiZANidine 2 MG tablet Commonly known as:  ZANAFLEX     TAKE these medications   acetaminophen 500 MG tablet Commonly known as:  TYLENOL Take 1,000 mg every 6 (six) hours as needed by mouth (pain).   calcium citrate 950 MG tablet Commonly known as:  CALCITRATE - dosed in mg elemental calcium Take 2.5 tablets (500 mg of elemental calcium total) by mouth 3 (three) times daily.   colchicine 0.6 MG tablet Take 1 tablet (0.6 mg total) by mouth 2 (two) times daily as needed (gout attacks).   Digoxin 62.5 MCG Tabs Take 0.0625 mg by mouth daily. Start taking on:  May 15, 2018   famotidine 40 MG tablet Commonly known as:  PEPCID Take 1 tablet (40 mg total) by mouth at bedtime.   gabapentin 300 MG capsule Commonly known as:  NEURONTIN TAKE 2 CAPSULEs(600 MG) BY MOUTH THREE TIMES DAILY What changed:    how much to take  how to take this  when to take this  additional instructions   lactose free nutrition Liqd Take 237 mLs by mouth 3 (three) times daily with meals. What changed:    when to take this  reasons to take this   metoprolol tartrate 25 MG tablet Commonly known as:  LOPRESSOR Take 1 tablet (25 mg total) by mouth 3 (three) times daily. What changed:    medication strength  how much to take  when to take this   misoprostol 100 MCG tablet Commonly known as:  CYTOTEC Take 1 tablet (100 mcg total) by mouth every 6 (six) hours.   multivitamin Tabs tablet Take 1 tablet by mouth 2 (two) times a day.   oxyCODONE 5 MG immediate release tablet Commonly known as:  Oxy IR/ROXICODONE Take 1 tablet (5 mg total)  by mouth every 6 (six) hours as needed for moderate pain or severe pain.   pantoprazole 40 MG tablet Commonly known as:  PROTONIX Take 40 mg by mouth 2 (two) times a day.   polyethylene glycol 17 g packet Commonly known as:  MIRALAX / GLYCOLAX Take 17 g by mouth daily. Start taking on:  May 15, 2018   rOPINIRole 0.5 MG tablet Commonly known as:  REQUIP TAKE 1 TABLET BY MOUTH THREE TIMES DAILY What changed:  Another medication with the same name was removed. Continue taking this medication, and follow the directions you see here.   senna 8.6 MG Tabs tablet Commonly known as:  SENOKOT Take 1 tablet (8.6 mg total) by mouth daily. Start taking on:  May 15, 2018   sucralfate 1 GM/10ML suspension Commonly known as:  CARAFATE Take 10 mLs (1 g total) by mouth every 6 (six) hours. Replaces:  sucralfate 1 g tablet   Toviaz 4 MG Tb24 tablet Generic drug:  fesoterodine TAKE 1 TABLET(4 MG) BY MOUTH DAILY What changed:  See  the new instructions.            Durable Medical Equipment  (From admission, onward)         Start     Ordered   05/10/18 1725  For home use only DME Walker  Once    Comments:  Rolling walker with 5" wheels per PT recommendation  Question:  Patient needs a walker to treat with the following condition  Answer:  Gait instability   05/10/18 1725          Discharge Instructions: Please refer to Patient Instructions section of EMR for full details.  Patient was counseled important signs and symptoms that should prompt return to medical care, changes in medications, dietary instructions, activity restrictions, and follow up appointments.   Follow-Up Appointments:  Contact information for follow-up providers    Dixie Dials, MD. Schedule an appointment as soon as possible for a visit in 1 week(s).   Specialty:  Cardiology Contact information: [place] [address] [phone]        [facility] Follow up.   Why:   [phone], home health services arranged           Contact information for after-discharge care    Destination    [facility] .   Service:  Skilled Nursing Contact information: [address] [place], [place], DO 05/14/2018, 12:22 PM PGY-1 [place]

## 2018-05-06 NOTE — Progress Notes (Signed)
Family Medicine Teaching Service Daily Progress Note Intern Pager: [phone]  Patient name: Deanna Spencer Medical record number: [id number] Date of birth: [birth year] Age: 78 y.o. Gender: female  Primary Care Provider: Nuala Alpha, DO Consultants: gen surg,GI Code Status: partial, no CPR but wants intubation  Pt Overview and Major Events to Date:  4/25 admission 4/26 1u pRBC  Assessment and Plan: Deanna Spencer is a 78 y.o. female presenting with abdominal pain. PMH is significant for Roux-en-Y surgery with multiple claimed revisions, multiple bleeding ulcers and history, repeated diverticulitis, history of MI, pacemaker and watchman, A. 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 fib without anticoagulation.   CT/CTA showed no acute diverticulitis/hydronephrosis/AAA disruption/bowel ischemia/dissection/bowel perf/acute bleed/abd abscess. FOBT positive. Hemoglobin was 8.3->7->6.8->1upRBC. Baseline appears to be somewhere between 10-12.  Elevated white count to 16.4->13.8.  urine cx pending. Lipase/lactic acid within normal limits.  Gen surg suggests GI consult for potential colonoscopy -consult GI -Blood cultures and urine cultures pending -cont zosyn (4/25- ) -N.p.o., sips with meds -Gentle hydration as patient has reduced ejection fraction -track CBC -No objective data on CAD, will consider transfusion threshold   Difficult IV access: PICC placed for cultures/contrast for imaging, PICC should not be considered source if bacteremia found  Potential Tylenol toxicity: tylenol level <10, ast wnl, ALT elevated to 70, labs do not indicate toxicity, will not avoidtylenol and have counseled patient -Stat Tylenol  level  Complicated cardiac history : history of MI, pacemaker and watchman, A. 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   Not anticoagulated due to history of GI bleed.  Is on metoprolol for rate control.  Rate control medication regimen restricted by ejection fraction of 30 to 35%. -continue metoprolol -Monitor on telemetry  overactive bladder-continue Toviaz  restless leg-on ropinirole  FEN/GI: npo sips w/ meds Prophylaxis: SCDs  Disposition: Telemetry  Subjective:  Pain improved but now on morphine, consents to blood transfusion, waiting for GI consult.  Says we can't discharge her without finding out what is wrong, we assured her orkup is not completed but we can't guarantee all the answer during the admission.  Objective: Temp:  [97.5 F (36.4 C)-98.5 F (36.9 C)] 97.5 F (36.4 C) (04/26 0631) Pulse Rate:  [79-137] 94 (04/26 0631) Resp:  [14-24] 16 (04/26 0631) BP: (83-132)/(54-89) 93/62 (04/26 0631) SpO2:  [85 %-100 %] 100 % (04/26 0631) Weight:  [55.3 kg] 55.3 kg (04/25 1051) Physical Exam: General: frail, pleasant, more comfortable than admission Cardiovascular: irregular rate but not tachy, 2/6 murmur, 1+ edema Respiratory: ctab, no iwb, no cough, no crackles Abdomen: still tender TP but less than admission, non-distended Extremities: moved herself around in bed but winced from belly pain  Laboratory: Recent Labs  Lab 05/05/18 1130 05/05/18 2154 05/06/18 0338  WBC 16.4* 15.1* 13.8*  HGB 8.3* 7.0* 6.8*  HCT 26.8* 22.1* 21.7*  PLT 860* 681* 627*   Recent Labs  Lab 05/05/18 1326 05/06/18 0338  NA 137 140  K 3.4* 3.5  CL 103 107  CO2 17* 22  BUN 25* 20  CREATININE 0.78 0.78  CALCIUM 9.1 8.5*  PROT 6.5  --   BILITOT 0.7  --   ALKPHOS 70  --  ALT 70*  --   AST 29  --   GLUCOSE 141* 92    Imaging/Diagnostic Tests: Ct Abdomen Pelvis Wo Contrast  Result Date:  05/05/2018 CLINICAL DATA:  Chronic lower abdominal pain. EXAM: CT ABDOMEN AND PELVIS WITHOUT CONTRAST TECHNIQUE: Multidetector CT imaging of the abdomen and pelvis was performed following the standard protocol without IV contrast. COMPARISON:  CT of the abdomen and pelvis with contrast on 08/08/2017 FINDINGS: Lower chest: No acute abnormality. Hepatobiliary: Stable dilatation of bile ducts after prior cholecystectomy. Unenhanced appearance of the liver is unremarkable. Pancreas: Unremarkable. No pancreatic ductal dilatation or surrounding inflammatory changes. Spleen: Normal in size without focal abnormality. Adrenals/Urinary Tract: No adrenal masses. Stable cortical scarring of both kidneys. No hydronephrosis or renal calculi identified. The bladder appears unremarkable. Stomach/Bowel: Stable appearance of prior gastric bypass. No evidence of bowel obstruction, ileus or free intraperitoneal air. Stable diverticulosis of the sigmoid colon without evidence by CT of acute diverticulitis. Vascular/Lymphatic: Stable mild aneurysmal dilatation of the infrarenal abdominal aorta measuring 3.3 cm in greatest diameter. No evidence of rupture. No enlarged lymph nodes identified. Reproductive: Status post hysterectomy. No adnexal masses. Other: No abdominal wall hernia or abnormality. No abdominopelvic ascites. Musculoskeletal: Stable appearance of spine after prior posterior lumbar fusion at L3-4. No fractures or bony lesions identified. IMPRESSION: 1. No acute findings in the abdomen or pelvis. 2. Stable dilatation of bile ducts after prior cholecystectomy. 3. Stable diverticulosis of the sigmoid colon without evidence of acute diverticulitis. 4. Stable mild aneurysmal disease of the abdominal aorta measuring 3.3 cm. 5. Stable appearance of prior gastric bypass. Electronically Signed   By: Aletta Edouard M.D.   On: 05/05/2018 15:41   Dg Chest 2 View  Addendum Date: 05/05/2018   ADDENDUM REPORT: 05/05/2018 21:33  ADDENDUM: Right PICC line has been placed with the tip in the SVC. Electronically Signed   By: Rolm Baptise M.D.   On: 05/05/2018 21:33   Result Date: 05/05/2018 CLINICAL DATA:  Abdominal pain EXAM: CHEST - 2 VIEW COMPARISON:  02/24/2017 FINDINGS: Left pacer remains in place, unchanged. Cardiomegaly. Lungs clear. No effusions. No acute bony abnormality. Prior bilateral shoulder replacements. IMPRESSION: Cardiomegaly.  No acute findings. Electronically Signed: By: Rolm Baptise M.D. On: 05/05/2018 21:14   Dg Abd 2 Views  Result Date: 05/05/2018 CLINICAL DATA:  Abdominal pain EXAM: ABDOMEN - 2 VIEW COMPARISON:  CT 05/05/2018 FINDINGS: Nonobstructive bowel gas pattern. No organomegaly or free air. Mild cardiomegaly. Pacer wires noted in the right heart. Lung bases clear. IMPRESSION: No obstruction or free air. Electronically Signed   By: Rolm Baptise M.D.   On: 05/05/2018 21:14   [place] Ekg Site Rite  Result Date: 05/05/2018 If Site Rite image not attached, placement could not be confirmed due to current cardiac rhythm.  Ct Angio Abd/pel W/ And/or W/o  Result Date: 05/05/2018 CLINICAL DATA:  Abdominal pain.  Concern for ischemia. EXAM: CTA ABDOMEN AND PELVIS wITHOUT AND WITH CONTRAST TECHNIQUE: Multidetector CT imaging of the abdomen and pelvis was performed using the standard protocol during bolus administration of intravenous contrast. Multiplanar reconstructed images and MIPs were obtained and reviewed to evaluate the vascular anatomy. CONTRAST:  168mL OMNIPAQUE IOHEXOL 350 MG/ML SOLN COMPARISON:  05/05/2018 FINDINGS: VASCULAR Aorta: 3 cm infrarenal abdominal aortic aneurysm.  Atherosclerosis. Celiac: Very small in caliber. The splenic artery appears to possibly fill via collaterals. SMA: Patent without evidence of aneurysm, dissection, vasculitis or significant stenosis. Renals: 2 small left renal arteries and 1 right renal artery.  No stenosis in the right renal artery. IMA: Patent Inflow: Iliac  atherosclerosis. No aneurysm or stenosis. No dissection. Proximal Outflow: Grossly unremarkable. Veins: No obvious venous abnormality within the limitations of this arterial phase study. Review of the MIP images confirms the above findings. NON-VASCULAR Lower chest: Cardiomegaly. Pacer wires in the right heart. Posterior left diaphragmatic hernia containing fat. Lung bases clear. No effusions. Hepatobiliary: Prior cholecystectomy.  No focal hepatic abnormality. Pancreas: No focal abnormality or ductal dilatation. Spleen: No focal abnormality.  Normal size. Adrenals/Urinary Tract: Area of cortical thinning and scarring in the anterior left kidney and upper pole of the left kidney. Lobular contours of the right kidney. No hydronephrosis. Adrenal glands and urinary bladder unremarkable. Stomach/Bowel: Postoperative changes in the stomach and proximal small bowel. No visible complicating feature. Scattered colonic diverticulosis, most pronounced in the descending and sigmoid colon. No active diverticulitis. Small bowel decompressed. No evidence of bowel obstruction. Lymphatic: No adenopathy Reproductive: Prior hysterectomy.  No adnexal masses. Other: No free fluid or free air. Musculoskeletal: Prior right hip replacement. Posterior fusion changes in the lumbar spine. No acute bony abnormality. IMPRESSION: VASCULAR Calcified aorta. Infrarenal abdominal aortic aneurysm, 3 cm. Recommend followup by ultrasound in 3 years. This recommendation follows ACR consensus guidelines: White Paper of the ACR Incidental Findings Committee II on Vascular Findings. J Am Coll Radiol 2013; 10:789-794. 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 SMA and IMA appear widely patent. NON-VASCULAR Prior cholecystectomy and hysterectomy. Colonic diverticulosis. No active diverticulitis. No visible bowel changes to suggest ischemia. Electronically Signed   By: Rolm Baptise M.D.   On:  05/05/2018 22:06   Lactic acid wnl  Sherene Sires, DO 05/06/2018, 7:32 AM PGY-2, [place] Intern pager: [phone], text pages welcome

## 2018-05-07 ENCOUNTER — Encounter (HOSPITAL_COMMUNITY): Payer: Self-pay | Admitting: General Practice

## 2018-05-07 DIAGNOSIS — G8929 Other chronic pain: Secondary | ICD-10-CM

## 2018-05-07 DIAGNOSIS — D508 Other iron deficiency anemias: Secondary | ICD-10-CM

## 2018-05-07 DIAGNOSIS — D649 Anemia, unspecified: Secondary | ICD-10-CM

## 2018-05-07 DIAGNOSIS — E46 Unspecified protein-calorie malnutrition: Secondary | ICD-10-CM

## 2018-05-07 DIAGNOSIS — K922 Gastrointestinal hemorrhage, unspecified: Secondary | ICD-10-CM

## 2018-05-07 DIAGNOSIS — R1032 Left lower quadrant pain: Secondary | ICD-10-CM

## 2018-05-07 DIAGNOSIS — K289 Gastrojejunal ulcer, unspecified as acute or chronic, without hemorrhage or perforation: Secondary | ICD-10-CM

## 2018-05-07 DIAGNOSIS — K219 Gastro-esophageal reflux disease without esophagitis: Secondary | ICD-10-CM

## 2018-05-07 DIAGNOSIS — R1310 Dysphagia, unspecified: Secondary | ICD-10-CM

## 2018-05-07 DIAGNOSIS — D500 Iron deficiency anemia secondary to blood loss (chronic): Secondary | ICD-10-CM

## 2018-05-07 DIAGNOSIS — R1013 Epigastric pain: Secondary | ICD-10-CM

## 2018-05-07 HISTORY — DX: Gastrointestinal hemorrhage, unspecified: K92.2

## 2018-05-07 LAB — COMPREHENSIVE METABOLIC PANEL
ALT: 47 U/L — ABNORMAL HIGH (ref 0–44)
AST: 39 U/L (ref 15–41)
Albumin: 2.5 g/dL — ABNORMAL LOW (ref 3.5–5.0)
Alkaline Phosphatase: 91 U/L (ref 38–126)
Anion gap: 12 (ref 5–15)
BUN: 19 mg/dL (ref 8–23)
CO2: 22 mmol/L (ref 22–32)
Calcium: 8.3 mg/dL — ABNORMAL LOW (ref 8.9–10.3)
Chloride: 103 mmol/L (ref 98–111)
Creatinine, Ser: 0.77 mg/dL (ref 0.44–1.00)
GFR calc Af Amer: >60 mL/min (ref >60)
GFR calc non Af Amer: >60 mL/min (ref >60)
Glucose, Bld: 88 mg/dL (ref 70–99)
Potassium: 3.6 mmol/L (ref 3.5–5.1)
Sodium: 137 mmol/L (ref 135–145)
Total Bilirubin: 0.8 mg/dL (ref 0.3–1.2)
Total Protein: 5.5 g/dL — ABNORMAL LOW (ref 6.5–8.1)

## 2018-05-07 LAB — URINE CULTURE

## 2018-05-07 LAB — CBC
HCT: 23.7 % — ABNORMAL LOW (ref 36.0–46.0)
Hemoglobin: 7.7 g/dL — ABNORMAL LOW (ref 12.0–15.0)
MCH: 29.7 pg (ref 26.0–34.0)
MCHC: 32.5 g/dL (ref 30.0–36.0)
MCV: 91.5 fL (ref 80.0–100.0)
Platelets: 553 10*3/uL — ABNORMAL HIGH (ref 150–400)
RBC: 2.59 MIL/uL — ABNORMAL LOW (ref 3.87–5.11)
RDW: 15.4 % (ref 11.5–15.5)
WBC: 10.4 10*3/uL (ref 4.0–10.5)
nRBC: 1.3 % — ABNORMAL HIGH (ref 0.0–0.2)

## 2018-05-07 LAB — TYPE AND SCREEN
ABO/RH(D): A POS
Antibody Screen: NEGATIVE
Unit division: 0

## 2018-05-07 LAB — BPAM RBC
Blood Product Expiration Date: 202005052359
ISSUE DATE / TIME: 202004260827
Unit Type and Rh: 6200

## 2018-05-07 LAB — HEMOGLOBIN AND HEMATOCRIT, BLOOD
HCT: 24.3 % — ABNORMAL LOW (ref 36.0–46.0)
Hemoglobin: 7.9 g/dL — ABNORMAL LOW (ref 12.0–15.0)

## 2018-05-07 MED ORDER — SODIUM CHLORIDE 0.9 % IV SOLN: INTRAVENOUS | Status: DC

## 2018-05-07 MED ORDER — BOOST / RESOURCE BREEZE PO LIQD CUSTOM: 1.0000 | Freq: Three times a day (TID) | ORAL | Status: DC

## 2018-05-07 MED ORDER — SODIUM CHLORIDE 0.9 % IV SOLN: 510.0000 mg | Freq: Once | INTRAVENOUS | Status: DC

## 2018-05-07 MED ORDER — ENSURE ENLIVE PO LIQD
237.0000 mL | Freq: Three times a day (TID) | ORAL | Status: DC
  Administered 2018-05-07 – 2018-05-08 (×3): 237 mL via ORAL

## 2018-05-07 MED ORDER — PANTOPRAZOLE SODIUM 40 MG PO TBEC
40.0000 mg | DELAYED_RELEASE_TABLET | Freq: Two times a day (BID) | ORAL | Status: DC
  Administered 2018-05-07 – 2018-05-14 (×15): 40 mg via ORAL
  Filled 2018-05-07 (×16): qty 1

## 2018-05-07 MED ORDER — METOPROLOL TARTRATE 5 MG/5ML IV SOLN
2.5000 mg | Freq: Once | INTRAVENOUS | Status: AC
  Administered 2018-05-07: 2.5 mg via INTRAVENOUS
  Filled 2018-05-07: qty 5

## 2018-05-07 MED ORDER — SUCRALFATE 1 GM/10ML PO SUSP
1.0000 g | Freq: Four times a day (QID) | ORAL | Status: DC
  Administered 2018-05-07 – 2018-05-14 (×29): 1 g via ORAL
  Filled 2018-05-07 (×29): qty 10

## 2018-05-07 MED ORDER — CALCIUM CARBONATE 1250 (500 CA) MG PO TABS: 500.0000 mg | ORAL_TABLET | Freq: Three times a day (TID) | ORAL | Status: DC

## 2018-05-07 MED ORDER — SODIUM CHLORIDE 0.9 % IV SOLN
INTRAVENOUS | Status: AC
  Administered 2018-05-07: 19:00:00 via INTRAVENOUS

## 2018-05-07 MED ORDER — CALCIUM CITRATE 950 (200 CA) MG PO TABS
500.0000 mg | ORAL_TABLET | Freq: Three times a day (TID) | ORAL | Status: DC
  Administered 2018-05-07 – 2018-05-09 (×7): 500 mg via ORAL
  Administered 2018-05-10: 200 mg via ORAL
  Administered 2018-05-10: 500 mg via ORAL
  Administered 2018-05-10: 400 mg via ORAL
  Administered 2018-05-11 – 2018-05-13 (×6): 500 mg via ORAL
  Filled 2018-05-07 (×16): qty 3

## 2018-05-07 MED ORDER — SODIUM CHLORIDE 0.9 % IV SOLN
510.0000 mg | Freq: Once | INTRAVENOUS | Status: AC
  Administered 2018-05-07: 510 mg via INTRAVENOUS
  Filled 2018-05-07: qty 17

## 2018-05-07 MED ORDER — DIGOXIN 0.25 MG/ML IJ SOLN
0.2500 mg | Freq: Every day | INTRAMUSCULAR | Status: AC
  Administered 2018-05-07 – 2018-05-08 (×2): 0.25 mg via INTRAVENOUS
  Filled 2018-05-07 (×2): qty 2

## 2018-05-07 MED ORDER — PROSIGHT PO TABS
1.0000 | ORAL_TABLET | Freq: Two times a day (BID) | ORAL | Status: DC
  Administered 2018-05-07 – 2018-05-14 (×15): 1 via ORAL
  Filled 2018-05-07 (×15): qty 1

## 2018-05-07 NOTE — Progress Notes (Signed)
Paged about increased HR to 130s in patient w/ hx of AFIB.  Went to room and patient was completely assymptomatic with HR auscaulated ~120-130, no chest pain or sensation of palpitations.  Ordered ECG, (physically reviewed as not in epic) and found afib w/ rvr  BP with map ~82 and HFrEF 30-35% limits rate control options so cardiology was consulted and will go see patient.  Notified daughter at patient's request and also caught her up on surgical updates.  Criss Rosales

## 2018-05-07 NOTE — Consult Note (Signed)
Referring Physician: Criss Rosales Scott/ Nuala Alpha, MD  Deanna Spencer is an 78 y.o. female.                       Chief Complaint: Atrial fibrillation with RVR  HPI: 78 years old female with PMH of Abdominal pain, Roux-en-Y anastomosis, Ischemic ulceration of GJ anastomosis, Iron deficiency and chronic GI loss anemia, Atrial fibrillation rate controlled with metoprolol, chronic systolic heart failure, lower back pain, moderate protein calorie malnutrition, gout, IBS, S/P permanent cardiac pacemaker has low blood pressure resulting in holding metoprolol followed by atrial fibrillation with RVR. Patient denies chest pain or dizziness.  Past Medical History:  Diagnosis Date  . AAA (abdominal aortic aneurysm) ([place])    3.5cm by CT 11/29/16  . AAA (abdominal aortic aneurysm) without rupture ([place]) 12/21/2016   Stable 3cm 4/20, needs f/u imaging ~4/23  . Acute abdominal pain 05/05/2018  . Anemia   . Atrial fibrillation ([place]) 11/22/2016  . Bee sting 06/28/2017  . Cerumen impaction 06/28/2017  . Childhood asthma    connected to allergies  . Chronic lower back pain   . Chronic systolic heart failure ([place]) 12/14/2016  . Diplopia 09/27/2017  . Diverticulosis   . Duodenal ulcer   . Dyspnea   . Dysrhythmia    a fib  . GERD (gastroesophageal reflux disease)   . GI bleed    "have had both upper and lower GIB"  . GI bleed 05/07/2018  . Gout   . Heart attack ([place]) 2017  . Heart murmur   . History of anastomosis and bypass hemorrhage 05/06/2018  . History of blood transfusion 2017-2018   "related to bleeding ulcers; 47 units PRBC; 2U plasma" (11/23/2016)  . History of duodenal ulcer 11/18/2016  . History of heart attack 11/18/2016  . History of hiatal hernia   . Hx of blood clots    in heart  . IBS (irritable bowel syndrome)   . Liver lesion 08/07/2017  . Neuropathy   . Osteoarthritis   . Osteoarthritis of right knee 05/30/2017  . Osteoporosis   . Other chronic pain 11/18/2016  . Parasomnia  11/18/2016  . Perforated viscus 11/22/2016  . Peripheral neuropathy   . 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 Presence of Watchman left atrial appendage closure device   . Primary osteoarthritis of left hip 02/24/2017  . 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 Primary osteoarthritis of right knee 06/02/2017  . Raynaud disease   . Rheumatoid arteritis ([place])   . Rheumatoid arthritis involving both hands ([place]) 09/27/2017  . S/P exploratory laparotomy 11/22/2016  . Seasonal allergies 08/07/2017  . 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 SSS (sick sinus syndrome) ([place])    a. s/p Medtronic PPM placement in 2017  . Stomach ulcer   . Vision changes 08/07/2017      Past Surgical History:  Procedure Laterality Date  . ABDOMINAL HYSTERECTOMY    . APPENDECTOMY    . ARTHROPLASTY Right 06/02/2017   RIGHT PATELLA-FEMORAL ARTHROPLASTY, with placement of a cemented 29 mm Stryker asymmetric triathlon patellar button, revision of the tibial polyethylene bearing.  Marland Kitchen CARPAL TUNNEL RELEASE Bilateral   . CATARACT EXTRACTION W/ INTRAOCULAR LENS  IMPLANT, BILATERAL Bilateral   . CHOLECYSTECTOMY OPEN    . DILATION AND CURETTAGE OF UTERUS    . FOOT FUSION Right   . INGUINAL HERNIA REPAIR Right X 2  . INSERT / REPLACE / REMOVE PACEMAKER  2017   medtronic  . JOINT REPLACEMENT    . LAPAROTOMY N/A 11/21/2016   Procedure: EXPLORATORY LAPAROTOMY, REVISION OF  GASTROJEJUNOSTOMY, ESOPHAGOGASTROSCOPY;  Surgeon: Clovis Riley, MD;  Location: [facility];  Service: General;  Laterality: N/A;  . LEFT ATRIAL APPENDAGE OCCLUSION  2018   placed  via groin  . LUMBAR FUSION  X 2  . PATELLA-FEMORAL ARTHROPLASTY Right 06/02/2017   Procedure: RIGHT PATELLA-FEMORAL ARTHROPLASTY;  Surgeon: Frederik Pear, MD;  Location: [facility];  Service: Orthopedics;  Laterality: Right;  . REVERSE SHOULDER ARTHROPLASTY Left    reverse shoulder replacement  . ROUX-EN-Y GASTRIC BYPASS  2005  . ROUX-EN-Y  PROCEDURE  20017 X 2   "revisions"  . TONSILLECTOMY AND ADENOIDECTOMY    . TOTAL HIP ARTHROPLASTY Right 02/24/2017   Procedure: TOTAL HIP ARTHROPLASTY;  Surgeon: Earlie Server, MD;  Location: [facility];  Service: Orthopedics;  Laterality: Right;  . TOTAL KNEE ARTHROPLASTY Bilateral   . TOTAL SHOULDER ARTHROPLASTY Bilateral   . TUBAL LIGATION      Family History  Problem Relation Age of Onset  . Heart disease Mother   . Liver disease Father   . Ulcers Father   . Prostate cancer Brother   . Colon cancer Neg Hx    Social History:  reports that she has never smoked. She has never used smokeless tobacco. She reports that she does not drink alcohol or use drugs.  Allergies:  Allergies  Allergen Reactions  . Demerol [Meperidine] Anaphylaxis  . Nsaids     Patient has had MULTIPLE revisions of her gastrojejunostomy for marginal ulcers including recent perforation    Medications Prior to Admission  Medication Sig Dispense Refill  . acetaminophen (TYLENOL) 500 MG tablet Take 1,000 mg every 6 (six) hours as needed by mouth (pain).     . Ensure (ENSURE) Take 237 mLs by mouth daily as needed (meal supplement).    . gabapentin (NEURONTIN) 300 MG capsule TAKE 2 CAPSULEs(600 MG) BY MOUTH THREE TIMES DAILY (Patient taking differently: Take 600 mg by mouth 3 (three) times daily. ) 360 capsule 6  . losartan (COZAAR) 25 MG tablet Take 25 mg by mouth daily.     . metoprolol tartrate (LOPRESSOR) 50 MG tablet Take 50 mg by mouth 2 (two) times a day.    . pantoprazole (PROTONIX) 40 MG tablet Take 40 mg by mouth 2 (two) times a day.    Marland Kitchen rOPINIRole (REQUIP) 0.5 MG tablet TAKE 1 TABLET BY MOUTH THREE TIMES DAILY (Patient taking differently: Take 0.5-1 mg by mouth See admin instructions. Take one tablet (0.5 mg) by mouth every afternoon and two tablets (1 mg) at night - for restless legs) 270 tablet 0  . sucralfate (CARAFATE) 1 g tablet TAKE 1 TABLET(1 GRAM) BY MOUTH FOUR TIMES DAILY AT BEDTIME WITH MEALS  (Patient taking differently: Take 1 g by mouth 4 (four) times daily. ) 368 tablet 0  . TOVIAZ 4 MG TB24 tablet TAKE 1 TABLET(4 MG) BY MOUTH DAILY (Patient taking differently: Take 4 mg by mouth daily. ) 90 tablet 3  . bismuth subsalicylate (PEPTO-BISMOL) 262 MG chewable tablet Chew 2 tablets (524 mg total) by mouth as needed. Can take up to 8 tablets total per day. Wait one hour between doses. (Patient not taking: Reported on 05/05/2018) 30 tablet 0  . colchicine 0.6 MG tablet Take 1 tablet (0.6 mg total) by mouth 2 (two) times daily as needed (gout attacks). (Patient not taking: Reported on 05/05/2018) 30 tablet 0  .  diclofenac (VOLTAREN) 75 MG EC tablet TAKE 1 TABLET(75 MG) BY MOUTH TWICE DAILY (Patient not taking: Reported on 05/05/2018) 180 tablet 1  . ferrous sulfate 325 (65 FE) MG tablet You can resume this in a couple weeks after your feeling better. (Patient not taking: Reported on 05/05/2018)  3  . loratadine (CLARITIN) 10 MG tablet Take 1 tablet (10 mg total) by mouth daily. (Patient not taking: Reported on 05/05/2018) 30 tablet 11  . Melatonin 1 MG CAPS Take 1 capsule (1 mg total) by mouth at bedtime. (Patient not taking: Reported on 05/05/2018) 30 capsule 6  . oxyCODONE-acetaminophen (PERCOCET/ROXICET) 5-325 MG tablet Take 1 tablet by mouth every 4 (four) hours as needed for severe pain. (Patient not taking: Reported on 05/05/2018) 30 tablet 0  . rOPINIRole (REQUIP) 0.5 MG tablet TAKE 1 TABLET BY MOUTH THREE TIMES DAILY (Patient not taking: Reported on 05/05/2018) 90 tablet 0  . tiZANidine (ZANAFLEX) 2 MG tablet Take 1 tablet (2 mg total) by mouth every 6 (six) hours as needed. (Patient not taking: Reported on 05/05/2018) 60 tablet 0    Results for orders placed or performed during the hospital encounter of 05/05/18 (from the past 48 hour(s))  Culture, Urine     Status: None   Collection Time: 05/05/18  5:22 PM  Result Value Ref Range   Specimen Description URINE, CLEAN CATCH    Special Requests       NONE Performed at [facility], [place] [address] [place], [place] [address]    Culture      Multiple bacterial morphotypes present, none predominant. Suggest appropriate recollection if clinically indicated.   Report Status 05/07/2018 FINAL   Culture, blood (routine x 2)     Status: None (Preliminary result)   Collection Time: 05/05/18  6:38 PM  Result Value Ref Range   Specimen Description BLOOD LEFT ARM    Special Requests      BOTTLES DRAWN AEROBIC ONLY Blood Culture adequate volume   Culture      NO GROWTH 2 DAYS Performed at [facility], [place] [address], [place], [place] [address]    Report Status PENDING   Acetaminophen level     Status: Abnormal   Collection Time: 05/05/18  6:45 PM  Result Value Ref Range   Acetaminophen (Tylenol), Serum <10 (L) 10 - 30 ug/mL    Comment: (NOTE) Therapeutic concentrations vary significantly. A range of 10-30 ug/mL  may be an effective concentration for many patients. However, some  are best treated at concentrations outside of this range. Acetaminophen concentrations >150 ug/mL at 4 hours after ingestion  and >50 ug/mL at 12 hours after ingestion are often associated with  toxic reactions. Performed at [facility], [place] [address] [place], [place] [address]   Culture, blood (routine x 2)     Status: None (Preliminary result)   Collection Time: 05/05/18  6:45 PM  Result Value Ref Range   Specimen Description BLOOD RIGHT ARM    Special Requests      BOTTLES DRAWN AEROBIC ONLY Blood Culture adequate volume   Culture      NO GROWTH 2 DAYS Performed at [facility], [place] [address] [place], [place] [address]    Report Status PENDING   Lactic acid, plasma     Status: None   Collection Time: 05/05/18  6:45 PM  Result Value Ref Range   Lactic Acid, Venous 1.1 0.5 - 1.9 mmol/L    Comment:  Performed at [facility], [place] [address], [place], [place] [address]  Iron and TIBC     Status: Abnormal    Collection Time: 05/05/18  6:45 PM  Result Value Ref Range   Iron 8 (L) 28 - 170 ug/dL   TIBC 245 (L) 250 - 450 ug/dL   Saturation Ratios 3 (L) 10.4 - 31.8 %   UIBC 237 ug/dL    Comment: Performed at [facility], [place] [address] [place], [place] [address]  Ferritin     Status: Abnormal   Collection Time: 05/05/18  6:45 PM  Result Value Ref Range   Ferritin 418 (H) 11 - 307 ng/mL    Comment: Performed at [facility], [place] [address], [place], [place] [address]  Lipid panel     Status: Abnormal   Collection Time: 05/05/18  6:45 PM  Result Value Ref Range   Cholesterol 114 0 - 200 mg/dL   Triglycerides 123 <150 mg/dL   HDL 31 (L) >40 mg/dL   Total CHOL/HDL Ratio 3.7 RATIO   VLDL 25 0 - 40 mg/dL   LDL Cholesterol 58 0 - 99 mg/dL    Comment:        Total Cholesterol/HDL:CHD Risk Coronary Heart Disease Risk Table                     Men   Women  1/2 Average Risk   3.4   3.3  Average Risk       5.0   4.4  2 X Average Risk   9.6   7.1  3 X Average Risk  23.4   11.0        Use the calculated Patient Ratio above and the CHD Risk Table to determine the patient's CHD Risk.        ATP III CLASSIFICATION (LDL):  <100     mg/dL   Optimal  100-129  mg/dL   Near or Above                    Optimal  130-159  mg/dL   Borderline  160-189  mg/dL   High  >190     mg/dL   Very High Performed at [place] [address] [place], [place] [address]   Lactic acid, plasma     Status: None   Collection Time: 05/05/18  9:54 PM  Result Value Ref Range   Lactic Acid, Venous 0.7 0.5 - 1.9 mmol/L    Comment: Performed at [facility] [address], [place], [place] [address]  CBC     Status: Abnormal   Collection Time: 05/05/18  9:54 PM  Result Value Ref Range   WBC 15.1 (H) 4.0 - 10.5 K/uL   RBC 2.39 (L) 3.87 - 5.11 MIL/uL   Hemoglobin 7.0 (L) 12.0 - 15.0 g/dL   HCT 22.1 (L) 36.0 - 46.0 %   MCV 92.5 80.0 - 100.0 fL   MCH 29.3 26.0 - 34.0 pg   MCHC 31.7 30.0 -  36.0 g/dL   RDW 14.8 11.5 - 15.5 %   Platelets 681 (H) 150 - 400 K/uL   nRBC 0.5 (H) 0.0 - 0.2 %    Comment: Performed at [facility] [address] [place], [place] [address]  CBC     Status: Abnormal   Collection Time: 05/06/18  3:38 AM  Result Value Ref Range   WBC  13.8 (H) 4.0 - 10.5 K/uL   RBC 2.36 (L) 3.87 - 5.11 MIL/uL   Hemoglobin 6.8 (LL) 12.0 - 15.0 g/dL    Comment: REPEATED TO VERIFY THIS CRITICAL RESULT HAS VERIFIED AND BEEN CALLED TO D.WILSON,RN BY MELISSA BROGDON ON 04 26 2020 AT 0420, AND HAS BEEN READ BACK.     HCT 21.7 (L) 36.0 - 46.0 %   MCV 91.9 80.0 - 100.0 fL   MCH 28.8 26.0 - 34.0 pg   MCHC 31.3 30.0 - 36.0 g/dL   RDW 15.1 11.5 - 15.5 %   Platelets 627 (H) 150 - 400 K/uL   nRBC 0.6 (H) 0.0 - 0.2 %    Comment: Performed at [facility] [address] [place], [place] [address]  Magnesium     Status: None   Collection Time: 05/06/18  3:38 AM  Result Value Ref Range   Magnesium 1.7 1.7 - 2.4 mg/dL    Comment: Performed at [facility] [address] [place], [place] [address]  Phosphorus     Status: None   Collection Time: 05/06/18  3:38 AM  Result Value Ref Range   Phosphorus 3.1 2.5 - 4.6 mg/dL    Comment: Performed at [facility] [address] [place], [place] [address]  Basic metabolic panel     Status: Abnormal   Collection Time: 05/06/18  3:38 AM  Result Value Ref Range   Sodium 140 135 - 145 mmol/L   Potassium 3.5 3.5 - 5.1 mmol/L   Chloride 107 98 - 111 mmol/L   CO2 22 22 - 32 mmol/L   Glucose, Bld 92 70 - 99 mg/dL   BUN 20 8 - 23 mg/dL   Creatinine, Ser 0.78 0.44 - 1.00 mg/dL   Calcium 8.5 (L) 8.9 - 10.3 mg/dL   GFR calc non Af Amer >60 >60 mL/min   GFR calc Af Amer >60 >60 mL/min   Anion gap 11 5 - 15    Comment: Performed at [facility], [place] [address], [place], [place] [address]  Prepare RBC     Status: None   Collection Time: 05/06/18  5:17 AM  Result Value Ref Range   Order Confirmation       ORDER PROCESSED BY BLOOD BANK Performed at [facility], [place] [address], [place], [place] [address]   Hemoglobin and hematocrit, blood     Status: Abnormal   Collection Time: 05/06/18  4:14 PM  Result Value Ref Range   Hemoglobin 8.0 (L) 12.0 - 15.0 g/dL   HCT 24.9 (L) 36.0 - 46.0 %    Comment: Performed at [facility] [address], [place], [place] [address]  Comprehensive metabolic panel     Status: Abnormal   Collection Time: 05/07/18  3:51 AM  Result Value Ref Range   Sodium 137 135 - 145 mmol/L   Potassium 3.6 3.5 - 5.1 mmol/L   Chloride 103 98 - 111 mmol/L   CO2 22 22 - 32 mmol/L   Glucose, Bld 88 70 - 99 mg/dL   BUN 19 8 - 23 mg/dL   Creatinine, Ser 0.77 0.44 - 1.00 mg/dL   Calcium 8.3 (L) 8.9 - 10.3 mg/dL   Total Protein 5.5 (L) 6.5 - 8.1 g/dL   Albumin 2.5 (L) 3.5 - 5.0 g/dL   AST 39 15 - 41 U/L   ALT 47 (H) 0 - 44 U/L   Alkaline Phosphatase 91  38 - 126 U/L   Total Bilirubin 0.8 0.3 - 1.2 mg/dL   GFR calc non Af Amer >60 >60 mL/min   GFR calc Af Amer >60 >60 mL/min   Anion gap 12 5 - 15    Comment: Performed at [facility] [address] [place], [place] [address]  CBC     Status: Abnormal   Collection Time: 05/07/18  3:51 AM  Result Value Ref Range   WBC 10.4 4.0 - 10.5 K/uL   RBC 2.59 (L) 3.87 - 5.11 MIL/uL   Hemoglobin 7.7 (L) 12.0 - 15.0 g/dL   HCT 23.7 (L) 36.0 - 46.0 %   MCV 91.5 80.0 - 100.0 fL   MCH 29.7 26.0 - 34.0 pg   MCHC 32.5 30.0 - 36.0 g/dL   RDW 15.4 11.5 - 15.5 %   Platelets 553 (H) 150 - 400 K/uL   nRBC 1.3 (H) 0.0 - 0.2 %    Comment: Performed at [facility], [place] [address] [place], [place] [address]   Dg Chest 2 View  Addendum Date: 05/05/2018   ADDENDUM REPORT: 05/05/2018 21:33 ADDENDUM: Right PICC line has been placed with the tip in the SVC. Electronically Signed   By: Rolm Baptise M.D.   On: 05/05/2018 21:33   Result Date: 05/05/2018 CLINICAL DATA:  Abdominal pain EXAM: CHEST - 2 VIEW COMPARISON:   02/24/2017 FINDINGS: Left pacer remains in place, unchanged. Cardiomegaly. Lungs clear. No effusions. No acute bony abnormality. Prior bilateral shoulder replacements. IMPRESSION: Cardiomegaly.  No acute findings. Electronically Signed: By: Rolm Baptise M.D. On: 05/05/2018 21:14   Dg Abd 2 Views  Result Date: 05/05/2018 CLINICAL DATA:  Abdominal pain EXAM: ABDOMEN - 2 VIEW COMPARISON:  CT 05/05/2018 FINDINGS: Nonobstructive bowel gas pattern. No organomegaly or free air. Mild cardiomegaly. Pacer wires noted in the right heart. Lung bases clear. IMPRESSION: No obstruction or free air. Electronically Signed   By: Rolm Baptise M.D.   On: 05/05/2018 21:14   [place] Ekg Site Rite  Result Date: 05/05/2018 If Site Rite image not attached, placement could not be confirmed due to current cardiac rhythm.  Ct Angio Abd/pel W/ And/or W/o  Result Date: 05/05/2018 CLINICAL DATA:  Abdominal pain.  Concern for ischemia. EXAM: CTA ABDOMEN AND PELVIS wITHOUT AND WITH CONTRAST TECHNIQUE: Multidetector CT imaging of the abdomen and pelvis was performed using the standard protocol during bolus administration of intravenous contrast. Multiplanar reconstructed images and MIPs were obtained and reviewed to evaluate the vascular anatomy. CONTRAST:  138mL OMNIPAQUE IOHEXOL 350 MG/ML SOLN COMPARISON:  05/05/2018 FINDINGS: VASCULAR Aorta: 3 cm infrarenal abdominal aortic aneurysm.  Atherosclerosis. Celiac: Very small in caliber. The splenic artery appears to possibly fill via collaterals. SMA: Patent without evidence of aneurysm, dissection, vasculitis or significant stenosis. Renals: 2 small left renal arteries and 1 right renal artery. No stenosis in the right renal artery. IMA: Patent Inflow: Iliac atherosclerosis. No aneurysm or stenosis. No dissection. Proximal Outflow: Grossly unremarkable. Veins: No obvious venous abnormality within the limitations of this arterial phase study. Review of the MIP images confirms the above  findings. NON-VASCULAR Lower chest: Cardiomegaly. Pacer wires in the right heart. Posterior left diaphragmatic hernia containing fat. Lung bases clear. No effusions. Hepatobiliary: Prior cholecystectomy.  No focal hepatic abnormality. Pancreas: No focal abnormality or ductal dilatation. Spleen: No focal abnormality.  Normal size. Adrenals/Urinary Tract: Area of cortical thinning and scarring in the anterior left kidney and upper pole of the left kidney.  Lobular contours of the right kidney. No hydronephrosis. Adrenal glands and urinary bladder unremarkable. Stomach/Bowel: Postoperative changes in the stomach and proximal small bowel. No visible complicating feature. Scattered colonic diverticulosis, most pronounced in the descending and sigmoid colon. No active diverticulitis. Small bowel decompressed. No evidence of bowel obstruction. Lymphatic: No adenopathy Reproductive: Prior hysterectomy.  No adnexal masses. Other: No free fluid or free air. Musculoskeletal: Prior right hip replacement. Posterior fusion changes in the lumbar spine. No acute bony abnormality. IMPRESSION: VASCULAR Calcified aorta. Infrarenal abdominal aortic aneurysm, 3 cm. Recommend followup by ultrasound in 3 years. This recommendation follows ACR consensus guidelines: White Paper of the ACR Incidental Findings Committee II on Vascular Findings. J Am Coll Radiol 2013; 10:789-794. 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 SMA and IMA appear widely patent. NON-VASCULAR Prior cholecystectomy and hysterectomy. Colonic diverticulosis. No active diverticulitis. No visible bowel changes to suggest ischemia. Electronically Signed   By: Rolm Baptise M.D.   On: 05/05/2018 22:06    Review Of Systems Constitutional: No fever, chills, weight loss or gain. Eyes: No vision change, wears glasses. No discharge or pain. Ears: No hearing loss, No tinnitus. Respiratory: No asthma, COPD, pneumonias.  No shortness of breath. No hemoptysis. Cardiovascular: Occasional chest pain, palpitation, leg edema. Gastrointestinal: Positive nausea, vomiting, diarrhea, constipation. Chronic GI bleed. No hepatitis. Genitourinary: No dysuria, hematuria, kidney stone. No incontinance. Neurological: No headache, stroke, seizures.  Psychiatry: No psych facility admission for anxiety, depression, suicide. No detox. Skin: No rash. Musculoskeletal: Positive joint pain, fibromyalgia, neck pain, back pain. Lymphadenopathy: No lymphadenopathy. Hematology: Positive anemia. No easy bruising.   Blood pressure (!) 86/59, pulse 98, temperature 98.7 F (37.1 C), temperature source Oral, resp. rate 18, height 4\' 11"  (1.499 m), weight 59.9 kg, SpO2 100 %. Body mass index is 26.66 kg/m. General appearance: alert, cooperative, appears stated age and no distress Head: Normocephalic, atraumatic. Eyes: Hazel eyes, pale conjunctiva, corneas clear. PERRL, EOM's intact. Neck: No adenopathy, no carotid bruit, no JVD, supple, symmetrical, trachea midline and thyroid not enlarged. Resp: Clear to auscultation bilaterally. Cardio: Irregular rate and rhythm, S1, S2 normal, II/VI systolic murmur, no click, rub or gallop GI: Soft, non-tender; bowel sounds normal; no organomegaly. Extremities: No edema, cyanosis or clubbing. Skin: Warm and dry.  Neurologic: Alert and oriented X 3, normal strength. Normal coordination and slow gait.  Assessment/Plan Atrial fibrillation with RVR, CHA2DS2ASc score of 4 Abdominal pain Ischemic GJ junction of RNY gastrojejunostomy Acute on chronic anemia, iron deficiency and blood loss Chronic systolic left heart failure, HFrEF  Add Digoxin for rate control. Smaller metoprolol dose as tolerated She is not a candidate for anticoagulation at this time.  Birdie Riddle, MD  05/07/2018, 4:24 PM

## 2018-05-07 NOTE — Progress Notes (Addendum)
Spoke to Patient's original Ambulance person.  They also do not think this is vascular, noted patient had prior history of ulcerations timed with soda intake (notes no smoking/nsaids) but when she would stop ulcers would improve.  Does suggest liquid sucralfate slurry over tablet, QID Continue PPI although they doubt acid production is likely issue.  They are available by text at anytime and will call back if requested [phone], Dr. Dorthula Rue   -Dr. Criss Rosales

## 2018-05-07 NOTE — Progress Notes (Addendum)
Initial Nutrition Assessment   RD working remotely  [place]:   Not applicable  INTERVENTION:    Boost Breeze po TID, each supplement provides 250 kcal and 9 grams of protein  NUTRITION DIAGNOSIS:   Increased nutrient needs related to chronic illness as evidenced by estimated needs  GOAL:   Patient will meet greater than or equal to 90% of their needs  MONITOR:   PO intake, Supplement acceptance, Labs, Weight trends, Skin, I & O's  REASON FOR ASSESSMENT:   Consult Assessment of nutrition requirement/status  ASSESSMENT:   78 y.o. Female presenting with abdominal pain. PMH is significant for Roux-en-Y surgery with multiple claimed revisions, multiple bleeding ulcers and history and repeated diverticulitis.  Admit dx include: PUD (peptic ulcer disease) [K27.9] Atrial fibrillation with rapid ventricular response ([place]) [I48.91] Sepsis ([place]) [A41.9] Other iron deficiency anemia [D50.8]  RD spoke with pt over phone. Pt somewhat tangential during interview. Does report her appetite is getting better.  She also states she was not eating PTA for 3 weeks. Labs & medications reviewed. UBW is 132 lb.  Pt also shared her daughter bought her Ensure supplements at some point in time but she could not "keep them down".  NUTRITION - FOCUSED PHYSICAL EXAM:  Unable to complete at this time  Diet Order:   Diet Order            Diet clear liquid Room service appropriate? Yes; Fluid consistency: Thin  Diet effective now             EDUCATION NEEDS:   Not appropriate for education at this time  Skin:  Skin Assessment: Reviewed RN Assessment  Last BM:  N/A  Height:   Ht Readings from Last 1 Encounters:  05/06/18 4\' 11"  (1.499 m)   Weight:   Wt Readings from Last 1 Encounters:  05/06/18 59.9 kg   BMI:  Body mass index is 26.66 kg/m.  Estimated Nutritional Needs:   Kcal:  1500-1700  Protein:  75-90 gm  Fluid:  1.5-1.7 L  Arthur Holms, RD,  LDN Pager #: [phone] After-Hours Pager #: [phone]

## 2018-05-07 NOTE — Progress Notes (Signed)
During shift assessment encouraged patient to get OOB to chair and attempt to use bedside commode during day shift instead of purwick.  Soft BP's; no distress. A&O x 4. Informed day RN of BP monitoring.

## 2018-05-07 NOTE — Progress Notes (Addendum)
Attempted to call Deanna Spencer the patient's daughter for an update.  I have discussed her case with one of our bariatric surgeons.  Recommendations at this point given appearance of ulcer on EGD being ischemic, are carafate slurry, protonix crushed in water BID, multi-vitamin BID, calcium TID, full liquid diet, ensure, referral to a tertiary care facility such as [facility], [place], or [facility] to discuss consideration of reversal of her bypass or total gastrectomy, etc.  Her celiac artery is small but still supplies blood.  The issue is more ischemia to her anastomosis which is clearly compromised given the appearance on EGD as well recurrence of ulcers at this location.  Given her history of multiple prior revisions it is unlikely a new revision is going to be successful given the previous ones have not.  It is also unclear how much gastric pouch she may have to work with.  I have relayed this information to the primary service.  They have contacted her original surgeon in [place] who feels like she gets ulcers with sodas but when she would stop these would improve.  The appearance of this ulcer appears more dramatic than that, but certainly she could stop soda intake as well along with all other suggested conservative treatments to see if this helps.  If it does then she may not need further surgery, but if it doesn't then she will need to see someone listed above.  Henreitta Cea 1:27 PM 05/07/2018

## 2018-05-07 NOTE — Progress Notes (Addendum)
Daily Rounding Note  05/07/2018, 10:35 AM  LOS: 2 days   SUBJECTIVE:   Chief complaint: Anastomotic ulcer.  Chronic abdominal pain, nausea, vomiting, anemia.    Patient feels better with morphine in place. No BMs.  She describes having had loose stools for many months but these stopped when she was unable to tolerate much in the way of p.o. Abdominal pain in the mid to lower abdomen persists but not as bad. Tolerating clears but not ready to advance diet.  OBJECTIVE:         Vital signs in last 24 hours:    Temp:  [97.7 F (36.5 C)-98.4 F (36.9 C)] 98.4 F (36.9 C) (04/27 0756) Pulse Rate:  [75-100] 98 (04/27 0756) Resp:  [14-22] 18 (04/27 0408) BP: (79-110)/(51-72) 91/67 (04/27 0756) SpO2:  [82 %-100 %] 100 % (04/27 0756) Weight:  [59.9 kg] 59.9 kg (04/26 1248) Last BM Date: (2 weeks prior) Filed Weights   05/05/18 1051 05/06/18 1248  Weight: 55.3 kg 59.9 kg   General: Anxious.  Thin, chronically ill appearing. Heart: RRR. Chest: Clear bilaterally.  No labored breathing or cough. Abdomen: Soft.  Minor tenderness in the mid left abdomen which is focal.  She guarded my hand to the exact point of where the tenderness is.  Active bowel sounds.  No distention. Extremities: No CCE. Neuro/Psych: Fully alert and oriented x3.  Anxious, cooperative.  Intake/Output from previous day: 04/26 0701 - 04/27 0700 In: 1414.9 [P.O.:530; I.V.:544.9; Blood:340] Out: 0   Intake/Output this shift: Total I/O In: 400 [P.O.:400] Out: -   Lab Results: Recent Labs    05/05/18 2154 05/06/18 0338 05/06/18 1614 05/07/18 0351  WBC 15.1* 13.8*  --  10.4  HGB 7.0* 6.8* 8.0* 7.7*  HCT 22.1* 21.7* 24.9* 23.7*  PLT 681* 627*  --  553*   BMET Recent Labs    05/05/18 1326 05/06/18 0338 05/07/18 0351  NA 137 140 137  K 3.4* 3.5 3.6  CL 103 107 103  CO2 17* 22 22  GLUCOSE 141* 92 88  BUN 25* 20 19  CREATININE 0.78 0.78 0.77    CALCIUM 9.1 8.5* 8.3*   LFT Recent Labs    05/05/18 1326 05/07/18 0351  PROT 6.5 5.5*  ALBUMIN 2.8* 2.5*  AST 29 39  ALT 70* 47*  ALKPHOS 70 91  BILITOT 0.7 0.8   PT/INR No results for input(s): LABPROT, INR in the last 72 hours. Hepatitis Panel No results for input(s): HEPBSAG, HCVAB, HEPAIGM, HEPBIGM in the last 72 hours.  Studies/Results: Ct Abdomen Pelvis Wo Contrast  Result Date: 05/05/2018 CLINICAL DATA:  Chronic lower abdominal pain. EXAM: CT ABDOMEN AND PELVIS WITHOUT CONTRAST TECHNIQUE: Multidetector CT imaging of the abdomen and pelvis was performed following the standard protocol without IV contrast. COMPARISON:  CT of the abdomen and pelvis with contrast on 08/08/2017 FINDINGS: Lower chest: No acute abnormality. Hepatobiliary: Stable dilatation of bile ducts after prior cholecystectomy. Unenhanced appearance of the liver is unremarkable. Pancreas: Unremarkable. No pancreatic ductal dilatation or surrounding inflammatory changes. Spleen: Normal in size without focal abnormality. Adrenals/Urinary Tract: No adrenal masses. Stable cortical scarring of both kidneys. No hydronephrosis or renal calculi identified. The bladder appears unremarkable. Stomach/Bowel: Stable appearance of prior gastric bypass. No evidence of bowel obstruction, ileus or free intraperitoneal air. Stable diverticulosis of the sigmoid colon without evidence by CT of acute diverticulitis. Vascular/Lymphatic: Stable mild aneurysmal dilatation of the infrarenal abdominal aorta measuring 3.3  cm in greatest diameter. No evidence of rupture. No enlarged lymph nodes identified. Reproductive: Status post hysterectomy. No adnexal masses. Other: No abdominal wall hernia or abnormality. No abdominopelvic ascites. Musculoskeletal: Stable appearance of spine after prior posterior lumbar fusion at L3-4. No fractures or bony lesions identified. IMPRESSION: 1. No acute findings in the abdomen or pelvis. 2. Stable dilatation of  bile ducts after prior cholecystectomy. 3. Stable diverticulosis of the sigmoid colon without evidence of acute diverticulitis. 4. Stable mild aneurysmal disease of the abdominal aorta measuring 3.3 cm. 5. Stable appearance of prior gastric bypass. Electronically Signed   By: Aletta Edouard M.D.   On: 05/05/2018 15:41   Dg Chest 2 View  Addendum Date: 05/05/2018   ADDENDUM REPORT: 05/05/2018 21:33 ADDENDUM: Right PICC line has been placed with the tip in the SVC. Electronically Signed   By: Rolm Baptise M.D.   On: 05/05/2018 21:33   Result Date: 05/05/2018 CLINICAL DATA:  Abdominal pain EXAM: CHEST - 2 VIEW COMPARISON:  02/24/2017 FINDINGS: Left pacer remains in place, unchanged. Cardiomegaly. Lungs clear. No effusions. No acute bony abnormality. Prior bilateral shoulder replacements. IMPRESSION: Cardiomegaly.  No acute findings. Electronically Signed: By: Rolm Baptise M.D. On: 05/05/2018 21:14   Dg Abd 2 Views  Result Date: 05/05/2018 CLINICAL DATA:  Abdominal pain EXAM: ABDOMEN - 2 VIEW COMPARISON:  CT 05/05/2018 FINDINGS: Nonobstructive bowel gas pattern. No organomegaly or free air. Mild cardiomegaly. Pacer wires noted in the right heart. Lung bases clear. IMPRESSION: No obstruction or free air. Electronically Signed   By: Rolm Baptise M.D.   On: 05/05/2018 21:14   [place] Ekg Site Rite  Result Date: 05/05/2018 If Site Rite image not attached, placement could not be confirmed due to current cardiac rhythm.  Ct Angio Abd/pel W/ And/or W/o  Result Date: 05/05/2018 CLINICAL DATA:  Abdominal pain.  Concern for ischemia. EXAM: CTA ABDOMEN AND PELVIS wITHOUT AND WITH CONTRAST TECHNIQUE: Multidetector CT imaging of the abdomen and pelvis was performed using the standard protocol during bolus administration of intravenous contrast. Multiplanar reconstructed images and MIPs were obtained and reviewed to evaluate the vascular anatomy. CONTRAST:  134mL OMNIPAQUE IOHEXOL 350 MG/ML SOLN COMPARISON:  05/05/2018  FINDINGS: VASCULAR Aorta: 3 cm infrarenal abdominal aortic aneurysm.  Atherosclerosis. Celiac: Very small in caliber. The splenic artery appears to possibly fill via collaterals. SMA: Patent without evidence of aneurysm, dissection, vasculitis or significant stenosis. Renals: 2 small left renal arteries and 1 right renal artery. No stenosis in the right renal artery. IMA: Patent Inflow: Iliac atherosclerosis. No aneurysm or stenosis. No dissection. Proximal Outflow: Grossly unremarkable. Veins: No obvious venous abnormality within the limitations of this arterial phase study. Review of the MIP images confirms the above findings. NON-VASCULAR Lower chest: Cardiomegaly. Pacer wires in the right heart. Posterior left diaphragmatic hernia containing fat. Lung bases clear. No effusions. Hepatobiliary: Prior cholecystectomy.  No focal hepatic abnormality. Pancreas: No focal abnormality or ductal dilatation. Spleen: No focal abnormality.  Normal size. Adrenals/Urinary Tract: Area of cortical thinning and scarring in the anterior left kidney and upper pole of the left kidney. Lobular contours of the right kidney. No hydronephrosis. Adrenal glands and urinary bladder unremarkable. Stomach/Bowel: Postoperative changes in the stomach and proximal small bowel. No visible complicating feature. Scattered colonic diverticulosis, most pronounced in the descending and sigmoid colon. No active diverticulitis. Small bowel decompressed. No evidence of bowel obstruction. Lymphatic: No adenopathy Reproductive: Prior hysterectomy.  No adnexal masses. Other: No free fluid or free air. Musculoskeletal:  Prior right hip replacement. Posterior fusion changes in the lumbar spine. No acute bony abnormality. IMPRESSION: VASCULAR Calcified aorta. Infrarenal abdominal aortic aneurysm, 3 cm. Recommend followup by ultrasound in 3 years. This recommendation follows ACR consensus guidelines: White Paper of the ACR Incidental Findings Committee II on  Vascular Findings. J Am Coll Radiol 2013; 10:789-794. 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 SMA and IMA appear widely patent. NON-VASCULAR Prior cholecystectomy and hysterectomy. Colonic diverticulosis. No active diverticulitis. No visible bowel changes to suggest ischemia. Electronically Signed   By: Rolm Baptise M.D.   On: 05/05/2018 22:06   Scheduled Meds:  sodium chloride   Intravenous Once   fesoterodine  4 mg Oral Daily   metoprolol tartrate  50 mg Oral BID   pantoprazole (PROTONIX) IV  40 mg Intravenous Q12H   rOPINIRole  0.5 mg Oral QPC lunch   rOPINIRole  1 mg Oral QHS   sodium chloride flush  3 mL Intravenous Q12H   sucralfate  1 g Oral Q6H   Continuous Infusions:  sodium chloride 50 mL/hr at 05/05/18 2321   PRN Meds:.morphine injection, sodium chloride flush   ASSESMENT:   *   Abdominal pain, nausea, vomiting. Chronic recurrent.   Hx gastric and anastomotic ulcers.  Takes Protonix BID, QID Carafate.   05/06/2018 EGD. Roux-en-Y gastrojejunostomy.  Ulceration at the [place] anastomosis, appearance suggests ischemia, biopsies obtained.  Moderate esophagitis. Small caliber celiac artery on CT Angio raises suspicion that the ulcer is ischemic in nature On BID PPI and Carafate.  Tums improved with supportive care.  *   Remote RNY gastric bypass.  RNY revisions x3.  *   Anxiety.    *   Acute on chronic anemia.  Hgb 6.8 >> 1 PRBC >> 7.7.   Anemia panel with low iron, low TIBC, low sat.  Ferritin 418   PLAN   *   Surgery evaluating, ? If any surgical options to address anastomotic ulcers.    *  Check B12 and folate levels.    *   Feraheme given today.    *   Await path report.    *    If not requested, would ask RD to evaluate for nutritional supplements, dietary guidelines      Deanna Spencer  05/07/2018, 10:35 AM Phone [phone]   Attending physician's note   I have taken an interval history,  reviewed the chart and examined the patient. I agree with the Advanced Practitioner's note, impression and recommendations.   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 We will sign off, available if have questions  K. Denzil Magnuson , MD [phone]

## 2018-05-07 NOTE — Progress Notes (Signed)
Family Medicine Teaching Service Daily Progress Note Intern Pager: [phone]  Patient name: Deanna Spencer Medical record number: [id number] Date of birth: October 01, 1940 Age: 78 y.o. Gender: female  Primary Care Provider: Nuala Alpha, DO Consultants: gen surg,GI Code Status: partial, no CPR but wants intubation  Pt Overview and Major Events to Date:  4/25 admission 4/26 1u pRBC  Assessment and Plan: Deanna Spencer is a 78 y.o. female presenting with abdominal pain. PMH is significant for Roux-en-Y surgery with multiple claimed revisions, multiple bleeding ulcers and history, repeated diverticulitis, history of MI, pacemaker and watchman, A. 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 fib without anticoagulation.   CT/CTA showed no acute diverticulitis/hydronephrosis/AAA disruption/bowel ischemia/dissection/bowel perf/acute bleed/abd abscess. FOBT positive.   Elevated white count to 16.4->13.8->wnl.  urine cx pending. Lipase/lactic acid within normal limits.  EGD showed potential ischemic ulcer, vascular read studies and say the celiac artery is small (not diseased) and a patent SMA makes celiac intervention unnecessary. -GI recs pending now that vascular states there is not a vascular intervention -called patient's original bariatric surgeon to see if they have any insight, [employer], [phone], [phone],,,call back pending -Blood cultures ngx2days -cont zosyn (4/25- ) -liquid diet -Gentle hydration as patient has reduced ejection fraction  Normocytic Anemia in setting of iron deficiency/GI bleed but potential for anemia of chronic disease from iron panel: iron 8, TIBC 245, ferritin 418.   FOBT +, Hemoglobin was 8.3->7->6.8->1upRBC->8->7.8. Baseline appears to be somewhere between 10-12. hx of being prescribed PO iron she wasn't taking due to stomach pain -ordered fereheme -will place iron in D/C summary, might need iv temporarily until she can tolerate PO  Difficult IV access: PICC placed for cultures/contrast for imaging, PICC should not be considered source if bacteremia found  Complicated cardiac history : history of MI, pacemaker and watchman, A. 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   Not anticoagulated due to history of GI bleed.  Is on metoprolol for rate control.  Rate control medication regimen restricted by ejection fraction of 30 to 35%. -continue metoprolol, hold if sBP <100 -Monitor on telemetry  overactive bladder-continue Toviaz  restless leg-on ropinirole  FEN/GI: npo sips w/ meds Prophylaxis: SCDs  Disposition: Telemetry  Subjective:  Pain response good on current morphine dose, wants [place] to call her original bariatric surgeon (# in plan).  Could potentially need outpatient pain referral unless resolution is found inpatient  Objective: Temp:  [97.7 F (36.5 C)-98.4 F (36.9 C)] 98.4 F (36.9 C) (04/27 0756) Pulse Rate:  [75-100] 98 (04/27 0756) Resp:  [14-22] 18 (04/27 0408) BP: (79-110)/(51-72) 91/67 (04/27 0756) SpO2:  [82 %-100 %] 100 % (04/27 0756) Weight:  [59.9 kg] 59.9 kg (04/26 1248) Physical Exam: General: more comfortable appearing, frail Cardiovascular: irregular rate, not tachy,2/6 murmur, 1+ edema Respiratory: no IWB, speaking in full sentences, no cough/wheeze. CTAB Abdomen: still TTP, but response reduced, not distended/firm Extremities: no lesions/deficits noted  Laboratory: Recent Labs  Lab 05/05/18 2154 05/06/18 0338 05/06/18 1614 05/07/18 0351  WBC 15.1* 13.8*  --  10.4  HGB 7.0* 6.8* 8.0* 7.7*  HCT 22.1* 21.7* 24.9* 23.7*  PLT 681* 627*   --  553*   Recent Labs  Lab 05/05/18 1326 05/06/18 0338 05/07/18 0351  NA 137 140 137  K 3.4* 3.5 3.6  CL 103 107 103  CO2 17* 22 22  BUN 25* 20 19  CREATININE 0.78 0.78 0.77  CALCIUM 9.1 8.5* 8.3*  PROT 6.5  --  5.5*  BILITOT 0.7  --  0.8  ALKPHOS 70  --  91  ALT 70*  --  47*  AST 29  --  39  GLUCOSE 141* 92 88    Imaging/Diagnostic Tests: Ct Abdomen Pelvis Wo Contrast  Result Date: 05/05/2018 CLINICAL DATA:  Chronic lower abdominal pain. EXAM: CT ABDOMEN AND PELVIS WITHOUT CONTRAST TECHNIQUE: Multidetector CT imaging of the abdomen and pelvis was performed following the standard protocol without IV contrast. COMPARISON:  CT of the abdomen and pelvis with contrast on 08/08/2017 FINDINGS: Lower chest: No acute abnormality. Hepatobiliary: Stable dilatation of bile ducts after prior cholecystectomy. Unenhanced appearance of the liver is unremarkable. Pancreas: Unremarkable. No pancreatic ductal dilatation or surrounding inflammatory changes. Spleen: Normal in size without focal abnormality. Adrenals/Urinary Tract: No adrenal masses. Stable cortical scarring of both kidneys. No hydronephrosis or renal calculi identified. The bladder appears unremarkable. Stomach/Bowel: Stable appearance of prior gastric bypass. No evidence of bowel obstruction, ileus or free intraperitoneal air. Stable diverticulosis of the sigmoid colon without evidence by CT of acute diverticulitis. Vascular/Lymphatic: Stable mild aneurysmal dilatation of the infrarenal abdominal aorta measuring 3.3 cm in greatest diameter. No evidence of rupture. No enlarged lymph nodes identified. Reproductive: Status post hysterectomy. No adnexal masses. Other: No abdominal wall hernia or abnormality. No abdominopelvic ascites. Musculoskeletal: Stable appearance of spine after prior posterior lumbar fusion at L3-4. No fractures or bony lesions identified. IMPRESSION: 1. No acute findings in the abdomen or pelvis. 2. Stable dilatation of  bile ducts after prior cholecystectomy. 3. Stable diverticulosis of the sigmoid colon without evidence of acute diverticulitis. 4. Stable mild aneurysmal disease of the abdominal aorta measuring 3.3 cm. 5. Stable appearance of prior gastric bypass. Electronically Signed   By: Aletta Edouard M.D.   On: 05/05/2018 15:41   Dg Chest 2 View  Addendum Date: 05/05/2018   ADDENDUM REPORT: 05/05/2018 21:33 ADDENDUM: Right PICC line has been placed with the tip in the SVC. Electronically Signed   By: Rolm Baptise M.D.   On: 05/05/2018 21:33   Result Date: 05/05/2018 CLINICAL DATA:  Abdominal pain EXAM: CHEST - 2 VIEW COMPARISON:  02/24/2017 FINDINGS: Left pacer remains in place, unchanged. Cardiomegaly. Lungs clear. No effusions. No acute bony abnormality. Prior bilateral shoulder replacements. IMPRESSION: Cardiomegaly.  No acute findings. Electronically Signed: By: Rolm Baptise M.D. On: 05/05/2018 21:14   Dg Abd 2 Views  Result Date: 05/05/2018 CLINICAL DATA:  Abdominal pain EXAM: ABDOMEN - 2 VIEW COMPARISON:  CT 05/05/2018 FINDINGS: Nonobstructive bowel gas pattern. No organomegaly or free air. Mild cardiomegaly. Pacer wires noted in the right heart. Lung bases clear. IMPRESSION: No obstruction or free air. Electronically Signed   By: Rolm Baptise M.D.   On: 05/05/2018 21:14   [place] Ekg Site Rite  Result Date: 05/05/2018 If Site Rite image not attached, placement could not be confirmed due to current cardiac rhythm.  Ct Angio Abd/pel W/ And/or W/o  Result Date: 05/05/2018 CLINICAL DATA:  Abdominal pain.  Concern for ischemia. EXAM: CTA ABDOMEN AND PELVIS wITHOUT AND WITH CONTRAST TECHNIQUE: Multidetector CT imaging of the abdomen and pelvis was performed using the standard protocol during bolus administration of intravenous contrast. Multiplanar reconstructed images and MIPs were  obtained and reviewed to evaluate the vascular anatomy. CONTRAST:  126mL OMNIPAQUE IOHEXOL 350 MG/ML SOLN COMPARISON:  05/05/2018  FINDINGS: VASCULAR Aorta: 3 cm infrarenal abdominal aortic aneurysm.  Atherosclerosis. Celiac: Very small in caliber. The splenic artery appears to possibly fill via collaterals. SMA: Patent without evidence of aneurysm, dissection, vasculitis or significant stenosis. Renals: 2 small left renal arteries and 1 right renal artery. No stenosis in the right renal artery. IMA: Patent Inflow: Iliac atherosclerosis. No aneurysm or stenosis. No dissection. Proximal Outflow: Grossly unremarkable. Veins: No obvious venous abnormality within the limitations of this arterial phase study. Review of the MIP images confirms the above findings. NON-VASCULAR Lower chest: Cardiomegaly. Pacer wires in the right heart. Posterior left diaphragmatic hernia containing fat. Lung bases clear. No effusions. Hepatobiliary: Prior cholecystectomy.  No focal hepatic abnormality. Pancreas: No focal abnormality or ductal dilatation. Spleen: No focal abnormality.  Normal size. Adrenals/Urinary Tract: Area of cortical thinning and scarring in the anterior left kidney and upper pole of the left kidney. Lobular contours of the right kidney. No hydronephrosis. Adrenal glands and urinary bladder unremarkable. Stomach/Bowel: Postoperative changes in the stomach and proximal small bowel. No visible complicating feature. Scattered colonic diverticulosis, most pronounced in the descending and sigmoid colon. No active diverticulitis. Small bowel decompressed. No evidence of bowel obstruction. Lymphatic: No adenopathy Reproductive: Prior hysterectomy.  No adnexal masses. Other: No free fluid or free air. Musculoskeletal: Prior right hip replacement. Posterior fusion changes in the lumbar spine. No acute bony abnormality. IMPRESSION: VASCULAR Calcified aorta. Infrarenal abdominal aortic aneurysm, 3 cm. Recommend followup by ultrasound in 3 years. This recommendation follows ACR consensus guidelines: White Paper of the ACR Incidental Findings Committee II on  Vascular Findings. J Am Coll Radiol 2013; 10:789-794. 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 SMA and IMA appear widely patent. NON-VASCULAR Prior cholecystectomy and hysterectomy. Colonic diverticulosis. No active diverticulitis. No visible bowel changes to suggest ischemia. Electronically Signed   By: Rolm Baptise M.D.   On: 05/05/2018 22:06     Sherene Sires, DO 05/07/2018, 9:14 AM PGY-2, [place] Intern pager: [phone], text pages welcome

## 2018-05-07 NOTE — Progress Notes (Signed)
Patient ID: Deanna Spencer, female   DOB: 1940-02-01, 78 y.o.   MRN: [id number]    1 Day Post-Op  Subjective: Patient still complaining of abdominal pain, epigastric in nature.  States it is like contractions with childbirth.  Tolerating CLD.  Pt tearful at times as she is frustrated with this situation given it is a chronic thing for her.  Objective: Vital signs in last 24 hours: Temp:  [97.7 F (36.5 C)-98.4 F (36.9 C)] 98.4 F (36.9 C) (04/27 0756) Pulse Rate:  [75-100] 98 (04/27 0756) Resp:  [14-22] 18 (04/27 0408) BP: (79-110)/(51-72) 91/67 (04/27 0756) SpO2:  [82 %-100 %] 100 % (04/27 0756) Weight:  [59.9 kg] 59.9 kg (04/26 1248) Last BM Date: (2 weeks prior)  Intake/Output from previous day: 04/26 0701 - 04/27 0700 In: 1414.9 [P.O.:530; I.V.:544.9; Blood:340] Out: 0  Intake/Output this shift: Total I/O In: 400 [P.O.:400] Out: -   PE: Abd: soft, nontender currently, +BS, ND  Lab Results:  Recent Labs    05/06/18 0338 05/06/18 1614 05/07/18 0351  WBC 13.8*  --  10.4  HGB 6.8* 8.0* 7.7*  HCT 21.7* 24.9* 23.7*  PLT 627*  --  553*   BMET Recent Labs    05/06/18 0338 05/07/18 0351  NA 140 137  K 3.5 3.6  CL 107 103  CO2 22 22  GLUCOSE 92 88  BUN 20 19  CREATININE 0.78 0.77  CALCIUM 8.5* 8.3*   PT/INR No results for input(s): LABPROT, INR in the last 72 hours. CMP     Component Value Date/Time   NA 137 05/07/2018 0351   NA 144 07/31/2017 1521   K 3.6 05/07/2018 0351   CL 103 05/07/2018 0351   CO2 22 05/07/2018 0351   GLUCOSE 88 05/07/2018 0351   BUN 19 05/07/2018 0351   BUN 20 07/31/2017 1521   CREATININE 0.77 05/07/2018 0351   CALCIUM 8.3 (L) 05/07/2018 0351   PROT 5.5 (L) 05/07/2018 0351   PROT 6.4 07/31/2017 1521   ALBUMIN 2.5 (L) 05/07/2018 0351   ALBUMIN 3.7 07/31/2017 1521   AST 39 05/07/2018 0351   ALT 47 (H) 05/07/2018 0351   ALKPHOS 91 05/07/2018 0351   BILITOT 0.8 05/07/2018 0351   BILITOT <0.2 07/31/2017 1521   GFRNONAA >60  05/07/2018 0351   GFRAA >60 05/07/2018 0351   Lipase     Component Value Date/Time   LIPASE 27 05/05/2018 1326       Studies/Results: Ct Abdomen Pelvis Wo Contrast  Result Date: 05/05/2018 CLINICAL DATA:  Chronic lower abdominal pain. EXAM: CT ABDOMEN AND PELVIS WITHOUT CONTRAST TECHNIQUE: Multidetector CT imaging of the abdomen and pelvis was performed following the standard protocol without IV contrast. COMPARISON:  CT of the abdomen and pelvis with contrast on 08/08/2017 FINDINGS: Lower chest: No acute abnormality. Hepatobiliary: Stable dilatation of bile ducts after prior cholecystectomy. Unenhanced appearance of the liver is unremarkable. Pancreas: Unremarkable. No pancreatic ductal dilatation or surrounding inflammatory changes. Spleen: Normal in size without focal abnormality. Adrenals/Urinary Tract: No adrenal masses. Stable cortical scarring of both kidneys. No hydronephrosis or renal calculi identified. The bladder appears unremarkable. Stomach/Bowel: Stable appearance of prior gastric bypass. No evidence of bowel obstruction, ileus or free intraperitoneal air. Stable diverticulosis of the sigmoid colon without evidence by CT of acute diverticulitis. Vascular/Lymphatic: Stable mild aneurysmal dilatation of the infrarenal abdominal aorta measuring 3.3 cm in greatest diameter. No evidence of rupture. No enlarged lymph nodes identified. Reproductive: Status post hysterectomy. No adnexal masses. Other:  No abdominal wall hernia or abnormality. No abdominopelvic ascites. Musculoskeletal: Stable appearance of spine after prior posterior lumbar fusion at L3-4. No fractures or bony lesions identified. IMPRESSION: 1. No acute findings in the abdomen or pelvis. 2. Stable dilatation of bile ducts after prior cholecystectomy. 3. Stable diverticulosis of the sigmoid colon without evidence of acute diverticulitis. 4. Stable mild aneurysmal disease of the abdominal aorta measuring 3.3 cm. 5. Stable  appearance of prior gastric bypass. Electronically Signed   By: Aletta Edouard M.D.   On: 05/05/2018 15:41   Dg Chest 2 View  Addendum Date: 05/05/2018   ADDENDUM REPORT: 05/05/2018 21:33 ADDENDUM: Right PICC line has been placed with the tip in the SVC. Electronically Signed   By: Rolm Baptise M.D.   On: 05/05/2018 21:33   Result Date: 05/05/2018 CLINICAL DATA:  Abdominal pain EXAM: CHEST - 2 VIEW COMPARISON:  02/24/2017 FINDINGS: Left pacer remains in place, unchanged. Cardiomegaly. Lungs clear. No effusions. No acute bony abnormality. Prior bilateral shoulder replacements. IMPRESSION: Cardiomegaly.  No acute findings. Electronically Signed: By: Rolm Baptise M.D. On: 05/05/2018 21:14   Dg Abd 2 Views  Result Date: 05/05/2018 CLINICAL DATA:  Abdominal pain EXAM: ABDOMEN - 2 VIEW COMPARISON:  CT 05/05/2018 FINDINGS: Nonobstructive bowel gas pattern. No organomegaly or free air. Mild cardiomegaly. Pacer wires noted in the right heart. Lung bases clear. IMPRESSION: No obstruction or free air. Electronically Signed   By: Rolm Baptise M.D.   On: 05/05/2018 21:14   [place] Ekg Site Rite  Result Date: 05/05/2018 If Site Rite image not attached, placement could not be confirmed due to current cardiac rhythm.  Ct Angio Abd/pel W/ And/or W/o  Result Date: 05/05/2018 CLINICAL DATA:  Abdominal pain.  Concern for ischemia. EXAM: CTA ABDOMEN AND PELVIS wITHOUT AND WITH CONTRAST TECHNIQUE: Multidetector CT imaging of the abdomen and pelvis was performed using the standard protocol during bolus administration of intravenous contrast. Multiplanar reconstructed images and MIPs were obtained and reviewed to evaluate the vascular anatomy. CONTRAST:  149mL OMNIPAQUE IOHEXOL 350 MG/ML SOLN COMPARISON:  05/05/2018 FINDINGS: VASCULAR Aorta: 3 cm infrarenal abdominal aortic aneurysm.  Atherosclerosis. Celiac: Very small in caliber. The splenic artery appears to possibly fill via collaterals. SMA: Patent without evidence of  aneurysm, dissection, vasculitis or significant stenosis. Renals: 2 small left renal arteries and 1 right renal artery. No stenosis in the right renal artery. IMA: Patent Inflow: Iliac atherosclerosis. No aneurysm or stenosis. No dissection. Proximal Outflow: Grossly unremarkable. Veins: No obvious venous abnormality within the limitations of this arterial phase study. Review of the MIP images confirms the above findings. NON-VASCULAR Lower chest: Cardiomegaly. Pacer wires in the right heart. Posterior left diaphragmatic hernia containing fat. Lung bases clear. No effusions. Hepatobiliary: Prior cholecystectomy.  No focal hepatic abnormality. Pancreas: No focal abnormality or ductal dilatation. Spleen: No focal abnormality.  Normal size. Adrenals/Urinary Tract: Area of cortical thinning and scarring in the anterior left kidney and upper pole of the left kidney. Lobular contours of the right kidney. No hydronephrosis. Adrenal glands and urinary bladder unremarkable. Stomach/Bowel: Postoperative changes in the stomach and proximal small bowel. No visible complicating feature. Scattered colonic diverticulosis, most pronounced in the descending and sigmoid colon. No active diverticulitis. Small bowel decompressed. No evidence of bowel obstruction. Lymphatic: No adenopathy Reproductive: Prior hysterectomy.  No adnexal masses. Other: No free fluid or free air. Musculoskeletal: Prior right hip replacement. Posterior fusion changes in the lumbar spine. No acute bony abnormality. IMPRESSION: VASCULAR Calcified aorta. Infrarenal abdominal  aortic aneurysm, 3 cm. Recommend followup by ultrasound in 3 years. This recommendation follows ACR consensus guidelines: White Paper of the ACR Incidental Findings Committee II on Vascular Findings. J Am Coll Radiol 2013; 10:789-794. 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 SMA and IMA appear widely patent. NON-VASCULAR Prior  cholecystectomy and hysterectomy. Colonic diverticulosis. No active diverticulitis. No visible bowel changes to suggest ischemia. Electronically Signed   By: Rolm Baptise M.D.   On: 05/05/2018 22:06    Anti-infectives: Anti-infectives (From admission, onward)   Start     Dose/Rate Route Frequency Ordered Stop   05/06/18 0300  piperacillin-tazobactam (ZOSYN) IVPB 3.375 g  Status:  Discontinued     3.375 g 12.5 mL/hr over 240 Minutes Intravenous Every 8 hours 05/05/18 1809 05/05/18 1810   05/05/18 1900  piperacillin-tazobactam (ZOSYN) IVPB 3.375 g  Status:  Discontinued     3.375 g 100 mL/hr over 30 Minutes Intravenous  Once 05/05/18 1809 05/05/18 1809   05/05/18 1900  piperacillin-tazobactam (ZOSYN) IVPB 3.375 g  Status:  Discontinued     3.375 g 12.5 mL/hr over 240 Minutes Intravenous Every 8 hours 05/05/18 1810 05/06/18 1116       Assessment/Plan  Other chronic pain   Atrial fibrillation (HCC)   Chronic systolic heart failure (HCC)  Chronic abdominal pain- patient states it is different pain in nature from marginal ulcer though last EGD did find ulcer a few months ago. 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  Given small caliber of celiac artery on her scan vascular surgery was consulted, but felt this did not need intervention. -will need to discuss with MD to  determine what else can be done for this patient given she has had multiple revisions in the past already.  FEN - CLD VTE - none currently ID - none currently POC - Daughter Deanna Spencer - [phone] ( will call later after speaking with MD)   LOS: 2 days    Henreitta Cea , [facility] 05/07/2018, 10:31 AM Pager: [phone]

## 2018-05-08 ENCOUNTER — Encounter (HOSPITAL_COMMUNITY): Payer: Self-pay | Admitting: Internal Medicine

## 2018-05-08 LAB — VITAMIN B12: Vitamin B-12: 539 pg/mL (ref 180–914)

## 2018-05-08 LAB — HEMOGLOBIN AND HEMATOCRIT, BLOOD
HCT: 23.3 % — ABNORMAL LOW (ref 36.0–46.0)
Hemoglobin: 7.6 g/dL — ABNORMAL LOW (ref 12.0–15.0)

## 2018-05-08 LAB — FOLATE: Folate: 6.7 ng/mL (ref >5.9)

## 2018-05-08 LAB — PREALBUMIN: Prealbumin: 15.1 mg/dL — ABNORMAL LOW (ref 18–38)

## 2018-05-08 MED ORDER — METOPROLOL TARTRATE 25 MG PO TABS
25.0000 mg | ORAL_TABLET | Freq: Four times a day (QID) | ORAL | Status: DC
  Administered 2018-05-08 – 2018-05-09 (×5): 25 mg via ORAL
  Filled 2018-05-08 (×5): qty 1

## 2018-05-08 MED ORDER — OXYCODONE HCL 5 MG PO TABS
5.0000 mg | ORAL_TABLET | Freq: Four times a day (QID) | ORAL | Status: DC | PRN
  Administered 2018-05-08 – 2018-05-09 (×4): 5 mg via ORAL
  Filled 2018-05-08 (×4): qty 1

## 2018-05-08 MED ORDER — BOOST PLUS PO LIQD
237.0000 mL | Freq: Three times a day (TID) | ORAL | Status: DC
  Administered 2018-05-08 – 2018-05-14 (×11): 237 mL via ORAL
  Filled 2018-05-08 (×19): qty 237

## 2018-05-08 MED ORDER — FAMOTIDINE 20 MG PO TABS
40.0000 mg | ORAL_TABLET | Freq: Every day | ORAL | Status: DC
  Administered 2018-05-08 – 2018-05-13 (×6): 40 mg via ORAL
  Filled 2018-05-08 (×6): qty 2

## 2018-05-08 MED ORDER — FAMOTIDINE 20 MG PO TABS: 40.0000 mg | ORAL_TABLET | Freq: Every day | ORAL | Status: DC

## 2018-05-08 MED ORDER — OXYCODONE HCL ER 10 MG PO T12A: 10.0000 mg | EXTENDED_RELEASE_TABLET | Freq: Two times a day (BID) | ORAL | Status: DC

## 2018-05-08 NOTE — Evaluation (Signed)
Physical Therapy Evaluation Patient Details Name: Deanna Spencer MRN: [id number] DOB: 04/04/1940 Today's Date: 05/08/2018   History of Present Illness  Pt is a 78 y/o female admitted secondary to worsening abdominal pain. Pt is s/p EGD which revealed Roux-en-Y gastrojejunostomy with gastrojejunal anastomosis characterized by ulceration. 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 PMH includes AAA, a fib, CHF, osteoporosis, MI, gout, and s/p pacemaker.   Clinical Impression  Pt admitted secondary to problem above with deficits below. Pt requiring min to min guard A for short distance ambulation to bathroom and back. Pt complaining of increased abdominal pain which limited ambulation. At d/c pt very adamant about returning home with her daughter. Feel she would benefit from use of RW, however, pt will likely refuse. Will continue to follow acutely to maximize functional mobility independence and safety.     Follow Up Recommendations Home health PT;Supervision for mobility/OOB    Equipment Recommendations  Rolling walker with 5" wheels    Recommendations for Other Services       Precautions / Restrictions Precautions Precautions: Fall Restrictions Weight Bearing Restrictions: No      Mobility  Bed Mobility Overal bed mobility: Needs Assistance Bed Mobility: Supine to Sit;Sit to Supine     Supine to sit: Min assist Sit to supine: Supervision   General bed mobility comments: Min A for trunk elevation. Increased time to come to sitting secondary to pain. Supervision for safety to return to supine.   Transfers Overall transfer level: Needs assistance Equipment used: None Transfers: Sit to/from Stand Sit to Stand: Min assist         General transfer comment: Min A for lift assist and steadying.   Ambulation/Gait Ambulation/Gait assistance: Min assist;Min guard Gait Distance (Feet): 20 Feet Assistive device: None Gait Pattern/deviations:  Step-through pattern;Decreased stride length Gait velocity: Decreased    General Gait Details: Slow, mildly unsteady gait. Pt reports increased weakness, however, refused AD. Min to min guard A to ambulate to the bathroom. Distance limited secondary to pain.   Stairs            Wheelchair Mobility    Modified Rankin (Stroke Patients Only)       Balance Overall balance assessment: Needs assistance Sitting-balance support: No upper extremity supported;Feet supported Sitting balance-Leahy Scale: Good     Standing balance support: No upper extremity supported;During functional activity Standing balance-Leahy Scale: Fair                               Pertinent Vitals/Pain Pain Assessment: Faces Faces Pain Scale: Hurts whole lot Pain Location: stomach Pain Descriptors / Indicators: Cramping;Aching Pain Intervention(s): Limited activity within patient's tolerance;Monitored during session;Repositioned    Home Living Family/patient expects to be discharged to:: Private residence Living Arrangements: Children Available Help at Discharge: Family;Available 24 hours/day Type of Home: House Home Access: Stairs to enter Entrance Stairs-Rails: Chemical engineer of Steps: 6 Home Layout: One level Home Equipment: Shower seat;Cane - single point      Prior Function Level of Independence: Independent         Comments: Pt reports she was independent, but feels like she has gotten weaker after multiple hospitalizations.      Hand Dominance        Extremity/Trunk Assessment   Upper Extremity Assessment Upper Extremity Assessment: Defer to OT evaluation    Lower Extremity Assessment Lower Extremity Assessment: Generalized weakness  Cervical / Trunk Assessment Cervical / Trunk Assessment: Kyphotic  Communication   Communication: No difficulties  Cognition Arousal/Alertness: Awake/alert Behavior During Therapy: WFL for tasks  assessed/performed Overall Cognitive Status: Within Functional Limits for tasks assessed                                        General Comments General comments (skin integrity, edema, etc.): pt very adamant about returning home     Exercises     Assessment/Plan    PT Assessment Patient needs continued PT services  PT Problem List Decreased strength;Decreased balance;Decreased activity tolerance;Decreased mobility;Decreased knowledge of use of DME;Decreased knowledge of precautions;Pain       PT Treatment Interventions Gait training;DME instruction;Stair training;Functional mobility training;Therapeutic activities;Therapeutic exercise;Balance training;Patient/family education    PT Goals (Current goals can be found in the Care Plan section)  Acute Rehab PT Goals Patient Stated Goal: to figure out what is going on with my stomach  PT Goal Formulation: With patient Time For Goal Achievement: 05/22/18 Potential to Achieve Goals: Good    Frequency Min 3X/week   Barriers to discharge        Co-evaluation               AM-PAC PT "6 Clicks" Mobility  Outcome Measure Help needed turning from your back to your side while in a flat bed without using bedrails?: A Little Help needed moving from lying on your back to sitting on the side of a flat bed without using bedrails?: A Little Help needed moving to and from a bed to a chair (including a wheelchair)?: A Little Help needed standing up from a chair using your arms (e.g., wheelchair or bedside chair)?: A Little Help needed to walk in hospital room?: A Little Help needed climbing 3-5 steps with a railing? : A Lot 6 Click Score: 17    End of Session Equipment Utilized During Treatment: Gait belt Activity Tolerance: Patient limited by pain Patient left: in bed;with call bell/phone within reach;with bed alarm set Nurse Communication: Mobility status PT Visit Diagnosis: Unsteadiness on feet (R26.81);Muscle  weakness (generalized) (M62.81);Pain Pain - part of body: (abdomen)    Time: 1448-1856 PT Time Calculation (min) (ACUTE ONLY): 29 min   Charges:   PT Evaluation $PT Eval Moderate Complexity: 1 Mod PT Treatments $Gait Training: 8-22 mins        Leighton Ruff, PT, DPT  Acute Rehabilitation Services  Pager: [phone] Office: [phone]   Rudean Hitt 05/08/2018, 1:18 PM

## 2018-05-08 NOTE — Anesthesia Postprocedure Evaluation (Signed)
Anesthesia Post Note  Patient: Deanna Spencer  Procedure(s) Performed: ESOPHAGOGASTRODUODENOSCOPY (EGD) WITH PROPOFOL (N/A ) BIOPSY     Patient location during evaluation: PACU Anesthesia Type: General Level of consciousness: awake and alert Pain management: pain level controlled Vital Signs Assessment: post-procedure vital signs reviewed and stable Respiratory status: spontaneous breathing, nonlabored ventilation, respiratory function stable and patient connected to nasal cannula oxygen Cardiovascular status: blood pressure returned to baseline and stable Postop Assessment: no apparent nausea or vomiting Anesthetic complications: no    Last Vitals:  Vitals:   05/08/18 0406 05/08/18 0919  BP: 96/68 (!) 110/58  Pulse: 76 81  Resp: 14   Temp: 36.9 C   SpO2: 98%     Last Pain:  Vitals:   05/08/18 0440  TempSrc:   PainSc: 2                  Gilford Lardizabal S

## 2018-05-08 NOTE — Progress Notes (Signed)
Pt BP 85/60. Pt c/o 6/10 abdominal pain. Pt denies chest pain or feeling lightheaded. On call coverage, Maudie Mercury, paged to see if it was appropriate to give pain medicine. MD said it was appropriate. PRN morphine given. Will continue to monitor BP.

## 2018-05-08 NOTE — Progress Notes (Signed)
Family Medicine Teaching Service Daily Progress Note Intern Pager: [phone]  Patient name: Deanna Spencer Medical record number: [id number] Date of birth: 07/04/1940 Age: 78 y.o. Gender: female  Primary Care Provider: Nuala Alpha, DO Consultants: gen surg,GI Code Status: partial, no CPR but wants intubation  Pt Overview and Major Events to Date:  4/25 admission 4/26 1u pRBC  Assessment and Plan: Deanna Spencer is a 78 y.o. female presenting with abdominal pain. PMH is significant for Roux-en-Y surgery with multiple claimed revisions, multiple bleeding ulcers and history, repeated diverticulitis, history of MI, pacemaker and watchman, A. fib with no anticoagulation, overactive bladder, HFrEF 30-35%,restless leg, hyperlipidemia, osteoarthritis with surgery of the right hip, AAA (stable 3.3cm).  Acute on chronic abdominal pain 2/2 anastomosis gastrojejunal ulcer/?ischemic: Improving.  In setting of previous Roux-en-Y with multiple previous revisions and ulcerations. Causing associated anemia 2/2 chronic blood loss and reduced oral intake with subsequent malnutrition/dehydration. Abdominal pain minimally improved today, tolerating some liquid diet. General surgery recommending referral to tertiary care center on d/c for possible reversal of bypass.  - GI signed off  - Surgery on board, recommending tertiary care outpatient - Recommendations via multispecialty including:   - Carafate 4 times daily  - PPI twice daily  - Pepcid 40 mg daily  - Multivitamin twice daily  - Calcium supplementation 3 times daily  - Advance diet as tolerated, currently full liquid  - Ensure currently, will switch to different  supplementation as she does not like Ensure - Transition IV morphine to oxycodone 5 mg every 6 as needed   Normocytic anemia: Acute on chronic, stable. Hemoglobin 7.6, stable from 7.9 yesterday.  Baseline appears to be around 10-12.  Received Feraheme on 4/27.  Likely multifactorial in the  setting of probable iron deficiency (lacking appropriate absorption without duodenum), chronic indolent blood loss, and likely anemia of chronic disease.  -Monitor CBC  - May need IV iron on a long-term basis  Atrial fibrillation not on chronic anticoagulation: Stable. HR 70-80s this morning.  In A. fib with RVR yesterday after metoprolol was held for lower blood pressures.  Cardiology on board, started digoxin on 4/27 and split her metoprolol into 4 daily doses. - Cardiology on board, appreciate recommendations - Metoprolol 25 mg 4 times daily - Digoxin 0.25 mg - Not an anticoagulation candidate with history of severe bleeds  CAD  HFrEF 30-35%: Chronic, stable.  Euvolemic.  On metoprolol as above.  Rate control medication and heart failure regimen limited by EF and lower blood pressures. - Continue metoprolol as above - Monitor fluid status - Telemetry  Overactive bladder: Chronic, stable.  - Continue Toviaz  Restless leg syndrome: No current symptomatology - Continue home Ropinirole   FEN/GI: Advance to soft diet  Prophylaxis: SCDs  Disposition: Improving, disposition pending on tolerating appropriate diet and pain control  Subjective:  Doing okay this morning, still having abdominal pains.  No vomiting, changes in bowel movements.  Able to have some liquids yesterday.  Is worried about getting better.  Objective: Temp:  [98.2 F (36.8 C)-98.7 F (37.1 C)] 98.5 F (36.9 C) (04/28 0406) Pulse Rate:  [76-98] 76 (04/28 0406) Resp:  [14] 14 (04/28 0406) BP: (85-97)/(59-70) 96/68 (04/28 0406) SpO2:  [98 %-100 %] 98 % (04/28 0406) Weight:  [55.9 kg] 55.9 kg (04/28 0500) Physical Exam:  General: Alert, NAD laying comfortably in bed HEENT: NCAT, MMM Cardiac: Irregular rate and rhythm with 2/6 systolic murmur Lungs: Clear bilaterally, no increased WOB  Abdomen: soft, slightly  tender to palpation vaguely in the left lower quadrant/suprapubic, non-distended, normoactive  BS Msk: Moves all extremities spontaneously  Ext: Warm, dry, 2+ distal pulses, no edema   Laboratory: Recent Labs  Lab 05/05/18 2154 05/06/18 0338  05/07/18 0351 05/07/18 1842 05/08/18 0335  WBC 15.1* 13.8*  --  10.4  --   --   HGB 7.0* 6.8*   < > 7.7* 7.9* 7.6*  HCT 22.1* 21.7*   < > 23.7* 24.3* 23.3*  PLT 681* 627*  --  553*  --   --    < > = values in this interval not displayed.   Recent Labs  Lab 05/05/18 1326 05/06/18 0338 05/07/18 0351  NA 137 140 137  K 3.4* 3.5 3.6  CL 103 107 103  CO2 17* 22 22  BUN 25* 20 19  CREATININE 0.78 0.78 0.77  CALCIUM 9.1 8.5* 8.3*  PROT 6.5  --  5.5*  BILITOT 0.7  --  0.8  ALKPHOS 70  --  91  ALT 70*  --  47*  AST 29  --  39  GLUCOSE 141* 92 88    Imaging/Diagnostic Tests: Ct Abdomen Pelvis Wo Contrast  Result Date: 05/05/2018 CLINICAL DATA:  Chronic lower abdominal pain. EXAM: CT ABDOMEN AND PELVIS WITHOUT CONTRAST TECHNIQUE: Multidetector CT imaging of the abdomen and pelvis was performed following the standard protocol without IV contrast. COMPARISON:  CT of the abdomen and pelvis with contrast on 08/08/2017 FINDINGS: Lower chest: No acute abnormality. Hepatobiliary: Stable dilatation of bile ducts after prior cholecystectomy. Unenhanced appearance of the liver is unremarkable. Pancreas: Unremarkable. No pancreatic ductal dilatation or surrounding inflammatory changes. Spleen: Normal in size without focal abnormality. Adrenals/Urinary Tract: No adrenal masses. Stable cortical scarring of both kidneys. No hydronephrosis or renal calculi identified. The bladder appears unremarkable. Stomach/Bowel: Stable appearance of prior gastric bypass. No evidence of bowel obstruction, ileus or free intraperitoneal air. Stable diverticulosis of the sigmoid colon without evidence by CT of acute diverticulitis. Vascular/Lymphatic: Stable mild aneurysmal dilatation of the infrarenal abdominal aorta measuring 3.3 cm in greatest diameter. No evidence of  rupture. No enlarged lymph nodes identified. Reproductive: Status post hysterectomy. No adnexal masses. Other: No abdominal wall hernia or abnormality. No abdominopelvic ascites. Musculoskeletal: Stable appearance of spine after prior posterior lumbar fusion at L3-4. No fractures or bony lesions identified. IMPRESSION: 1. No acute findings in the abdomen or pelvis. 2. Stable dilatation of bile ducts after prior cholecystectomy. 3. Stable diverticulosis of the sigmoid colon without evidence of acute diverticulitis. 4. Stable mild aneurysmal disease of the abdominal aorta measuring 3.3 cm. 5. Stable appearance of prior gastric bypass. Electronically Signed   By: Aletta Edouard M.D.   On: 05/05/2018 15:41   Dg Chest 2 View  Addendum Date: 05/05/2018   ADDENDUM REPORT: 05/05/2018 21:33 ADDENDUM: Right PICC line has been placed with the tip in the SVC. Electronically Signed   By: Rolm Baptise M.D.   On: 05/05/2018 21:33   Result Date: 05/05/2018 CLINICAL DATA:  Abdominal pain EXAM: CHEST - 2 VIEW COMPARISON:  02/24/2017 FINDINGS: Left pacer remains in place, unchanged. Cardiomegaly. Lungs clear. No effusions. No acute bony abnormality. Prior bilateral shoulder replacements. IMPRESSION: Cardiomegaly.  No acute findings. Electronically Signed: By: Rolm Baptise M.D. On: 05/05/2018 21:14   Dg Abd 2 Views  Result Date: 05/05/2018 CLINICAL DATA:  Abdominal pain EXAM: ABDOMEN - 2 VIEW COMPARISON:  CT 05/05/2018 FINDINGS: Nonobstructive bowel gas pattern. No organomegaly or free air. Mild cardiomegaly. Pacer  wires noted in the right heart. Lung bases clear. IMPRESSION: No obstruction or free air. Electronically Signed   By: Rolm Baptise M.D.   On: 05/05/2018 21:14   [place] Ekg Site Rite  Result Date: 05/05/2018 If Site Rite image not attached, placement could not be confirmed due to current cardiac rhythm.  Ct Angio Abd/pel W/ And/or W/o  Result Date: 05/05/2018 CLINICAL DATA:  Abdominal pain.  Concern for  ischemia. EXAM: CTA ABDOMEN AND PELVIS wITHOUT AND WITH CONTRAST TECHNIQUE: Multidetector CT imaging of the abdomen and pelvis was performed using the standard protocol during bolus administration of intravenous contrast. Multiplanar reconstructed images and MIPs were obtained and reviewed to evaluate the vascular anatomy. CONTRAST:  174mL OMNIPAQUE IOHEXOL 350 MG/ML SOLN COMPARISON:  05/05/2018 FINDINGS: VASCULAR Aorta: 3 cm infrarenal abdominal aortic aneurysm.  Atherosclerosis. Celiac: Very small in caliber. The splenic artery appears to possibly fill via collaterals. SMA: Patent without evidence of aneurysm, dissection, vasculitis or significant stenosis. Renals: 2 small left renal arteries and 1 right renal artery. No stenosis in the right renal artery. IMA: Patent Inflow: Iliac atherosclerosis. No aneurysm or stenosis. No dissection. Proximal Outflow: Grossly unremarkable. Veins: No obvious venous abnormality within the limitations of this arterial phase study. Review of the MIP images confirms the above findings. NON-VASCULAR Lower chest: Cardiomegaly. Pacer wires in the right heart. Posterior left diaphragmatic hernia containing fat. Lung bases clear. No effusions. Hepatobiliary: Prior cholecystectomy.  No focal hepatic abnormality. Pancreas: No focal abnormality or ductal dilatation. Spleen: No focal abnormality.  Normal size. Adrenals/Urinary Tract: Area of cortical thinning and scarring in the anterior left kidney and upper pole of the left kidney. Lobular contours of the right kidney. No hydronephrosis. Adrenal glands and urinary bladder unremarkable. Stomach/Bowel: Postoperative changes in the stomach and proximal small bowel. No visible complicating feature. Scattered colonic diverticulosis, most pronounced in the descending and sigmoid colon. No active diverticulitis. Small bowel decompressed. No evidence of bowel obstruction. Lymphatic: No adenopathy Reproductive: Prior hysterectomy.  No adnexal  masses. Other: No free fluid or free air. Musculoskeletal: Prior right hip replacement. Posterior fusion changes in the lumbar spine. No acute bony abnormality. IMPRESSION: VASCULAR Calcified aorta. Infrarenal abdominal aortic aneurysm, 3 cm. Recommend followup by ultrasound in 3 years. This recommendation follows ACR consensus guidelines: White Paper of the ACR Incidental Findings Committee II on Vascular Findings. J Am Coll Radiol 2013; 10:789-794. 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 SMA and IMA appear widely patent. NON-VASCULAR Prior cholecystectomy and hysterectomy. Colonic diverticulosis. No active diverticulitis. No visible bowel changes to suggest ischemia. Electronically Signed   By: Rolm Baptise M.D.   On: 05/05/2018 22:06     Patriciaann Clan, DO 05/08/2018, 7:32 AM PGY-1, [place] Intern pager: [phone], text pages welcome

## 2018-05-08 NOTE — Consult Note (Signed)
Ref: Nuala Alpha, DO   Subjective:  Sitting up. Afebrile. Abdominal pain controlled with pain medication. Heart rate controlled with metoprolol and digoxin use.  Pacemaker interrogation revealed VVIR mode with 11 years of battery life as she rarely pacemaker support Monitor shows atrial fibrillation with heart rate of 80 to 110 bpm.  Objective:  Vital Signs in the last 24 hours: Temp:  [98.2 F (36.8 C)-98.7 F (37.1 C)] 98.5 F (36.9 C) (04/28 0406) Pulse Rate:  [76-88] 76 (04/28 0406) Cardiac Rhythm: Atrial fibrillation;Bundle branch block (04/27 1903) Resp:  [14] 14 (04/28 0406) BP: (85-97)/(59-70) 96/68 (04/28 0406) SpO2:  [98 %-100 %] 98 % (04/28 0406) Weight:  [55.9 kg] 55.9 kg (04/28 0500)  Physical Exam: BP Readings from Last 1 Encounters:  05/08/18 96/68     Wt Readings from Last 1 Encounters:  05/08/18 55.9 kg    Weight change: -3.946 kg Body mass index is 24.9 kg/m. HEENT: [place]/AT, Eyes-Hazel, PERL, EOMI, Conjunctiva-Pale, Sclera-Non-icteric Neck: No JVD, No bruit, Trachea midline. Lungs:  Clear, Bilateral. Cardiac:  Irregular rhythm, normal S1 and S2, no S3. II/VI systolic murmur. Abdomen:  Soft, BS present. Extremities:  No edema present. No cyanosis. No clubbing. CNS: AxOx3, Cranial nerves grossly intact, moves all 4 extremities.  Skin: Warm and dry.   Intake/Output from previous day: 04/27 0701 - 04/28 0700 In: 1143.3 [P.O.:900; I.V.:243.3] Out: 400 [Urine:400]    Lab Results: BMET    Component Value Date/Time   NA 137 05/07/2018 0351   NA 140 05/06/2018 0338   NA 137 05/05/2018 1326   NA 144 07/31/2017 1521   K 3.6 05/07/2018 0351   K 3.5 05/06/2018 0338   K 3.4 (L) 05/05/2018 1326   CL 103 05/07/2018 0351   CL 107 05/06/2018 0338   CL 103 05/05/2018 1326   CO2 22 05/07/2018 0351   CO2 22 05/06/2018 0338   CO2 17 (L) 05/05/2018 1326   GLUCOSE 88 05/07/2018 0351   GLUCOSE 92 05/06/2018 0338   GLUCOSE 141 (H) 05/05/2018 1326   BUN 19  05/07/2018 0351   BUN 20 05/06/2018 0338   BUN 25 (H) 05/05/2018 1326   BUN 20 07/31/2017 1521   CREATININE 0.77 05/07/2018 0351   CREATININE 0.78 05/06/2018 0338   CREATININE 0.78 05/05/2018 1326   CALCIUM 8.3 (L) 05/07/2018 0351   CALCIUM 8.5 (L) 05/06/2018 0338   CALCIUM 9.1 05/05/2018 1326   GFRNONAA >60 05/07/2018 0351   GFRNONAA >60 05/06/2018 0338   GFRNONAA >60 05/05/2018 1326   GFRAA >60 05/07/2018 0351   GFRAA >60 05/06/2018 0338   GFRAA >60 05/05/2018 1326   CBC    Component Value Date/Time   WBC 10.4 05/07/2018 0351   RBC 2.59 (L) 05/07/2018 0351   HGB 7.6 (L) 05/08/2018 0335   HGB 12.0 07/31/2017 1521   HCT 23.3 (L) 05/08/2018 0335   HCT 38.2 07/31/2017 1521   PLT 553 (H) 05/07/2018 0351   PLT 356 07/31/2017 1521   MCV 91.5 05/07/2018 0351   MCV 91 07/31/2017 1521   MCH 29.7 05/07/2018 0351   MCHC 32.5 05/07/2018 0351   RDW 15.4 05/07/2018 0351   RDW 15.2 07/31/2017 1521   LYMPHSABS 1.0 05/05/2018 1130   MONOABS 0.8 05/05/2018 1130   EOSABS 0.0 05/05/2018 1130   BASOSABS 0.1 05/05/2018 1130   HEPATIC Function Panel Recent Labs    07/31/17 1521 05/05/18 1326 05/07/18 0351  PROT 6.4 6.5 5.5*   HEMOGLOBIN A1C No components found for:  HGA1C,  MPG CARDIAC ENZYMES No results found for: CKTOTAL, CKMB, CKMBINDEX, TROPONINI BNP No results for input(s): PROBNP in the last 8760 hours. TSH Recent Labs    09/22/17 0000  TSH 0.654   CHOLESTEROL Recent Labs    05/05/18 1845  CHOL 114    Scheduled Meds: . sodium chloride   Intravenous Once  . calcium citrate  500 mg of elemental calcium Oral TID  . digoxin  0.25 mg Intravenous Daily  . feeding supplement (ENSURE ENLIVE)  237 mL Oral TID BM  . fesoterodine  4 mg Oral Daily  . metoprolol tartrate  25 mg Oral QID  . multivitamin  1 tablet Oral BID  . pantoprazole  40 mg Oral BID  . rOPINIRole  0.5 mg Oral QPC lunch  . rOPINIRole  1 mg Oral QHS  . sodium chloride flush  3 mL Intravenous Q12H  .  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 Increase oral fluids intake as tolerated. No anticoagulation for now.   LOS: 3 days    Dixie Dials  MD  05/08/2018, 9:09 AM

## 2018-05-08 NOTE — Progress Notes (Signed)
Attempted to contact Deanna Spencer, patient's daughter, again today and still no answer.  Primary team appears to have updated her on our recommendations yesterday which is appreciated.  Deanna Spencer 11:20 AM 05/08/2018

## 2018-05-08 NOTE — Progress Notes (Signed)
Patient ID: Deanna Spencer, female   DOB: Jun 08, 1940, 78 y.o.   MRN: [id number]    2 Days Post-Op  Subjective: Patient tolerating her full liquids well and states it makes her feel better.  Still having pain overall though.  Doesn't like the ensure.    Objective: Vital signs in last 24 hours: Temp:  [98.2 F (36.8 C)-98.7 F (37.1 C)] 98.5 F (36.9 C) (04/28 0406) Pulse Rate:  [76-88] 81 (04/28 0919) Resp:  [14] 14 (04/28 0406) BP: (85-110)/(58-70) 110/58 (04/28 0919) SpO2:  [98 %-100 %] 98 % (04/28 0406) Weight:  [55.9 kg] 55.9 kg (04/28 0500) Last BM Date: (2 weeks ago per pt)  Intake/Output from previous day: 04/27 0701 - 04/28 0700 In: 1143.3 [P.O.:900; I.V.:243.3] Out: 400 [Urine:400] Intake/Output this shift: No intake/output data recorded.  PE: Abd: soft, minimally tender, +BS, ND  Lab Results:  Recent Labs    05/06/18 0338  05/07/18 0351 05/07/18 1842 05/08/18 0335  WBC 13.8*  --  10.4  --   --   HGB 6.8*   < > 7.7* 7.9* 7.6*  HCT 21.7*   < > 23.7* 24.3* 23.3*  PLT 627*  --  553*  --   --    < > = values in this interval not displayed.   BMET Recent Labs    05/06/18 0338 05/07/18 0351  NA 140 137  K 3.5 3.6  CL 107 103  CO2 22 22  GLUCOSE 92 88  BUN 20 19  CREATININE 0.78 0.77  CALCIUM 8.5* 8.3*   PT/INR No results for input(s): LABPROT, INR in the last 72 hours. CMP     Component Value Date/Time   NA 137 05/07/2018 0351   NA 144 07/31/2017 1521   K 3.6 05/07/2018 0351   CL 103 05/07/2018 0351   CO2 22 05/07/2018 0351   GLUCOSE 88 05/07/2018 0351   BUN 19 05/07/2018 0351   BUN 20 07/31/2017 1521   CREATININE 0.77 05/07/2018 0351   CALCIUM 8.3 (L) 05/07/2018 0351   PROT 5.5 (L) 05/07/2018 0351   PROT 6.4 07/31/2017 1521   ALBUMIN 2.5 (L) 05/07/2018 0351   ALBUMIN 3.7 07/31/2017 1521   AST 39 05/07/2018 0351   ALT 47 (H) 05/07/2018 0351   ALKPHOS 91 05/07/2018 0351   BILITOT 0.8 05/07/2018 0351   BILITOT <0.2 07/31/2017 1521   GFRNONAA >60 05/07/2018 0351   GFRAA >60 05/07/2018 0351   Lipase     Component Value Date/Time   LIPASE 27 05/05/2018 1326       Studies/Results: No results found.  Anti-infectives: Anti-infectives (From admission, onward)   Start     Dose/Rate Route Frequency Ordered Stop   05/06/18 0300  piperacillin-tazobactam (ZOSYN) IVPB 3.375 g  Status:  Discontinued     3.375 g 12.5 mL/hr over 240 Minutes Intravenous Every 8 hours 05/05/18 1809 05/05/18 1810   05/05/18 1900  piperacillin-tazobactam (ZOSYN) IVPB 3.375 g  Status:  Discontinued     3.375 g 100 mL/hr over 30 Minutes Intravenous  Once 05/05/18 1809 05/05/18 1809   05/05/18 1900  piperacillin-tazobactam (ZOSYN) IVPB 3.375 g  Status:  Discontinued     3.375 g 12.5 mL/hr over 240 Minutes Intravenous Every 8 hours 05/05/18 1810 05/06/18 1116       Assessment/Plan Other chronic pain Atrial fibrillation (HCC) Chronic systolic heart failure (HCC)  Chronic abdominal pain- patient states it is different pain in nature from marginal ulcer though last EGD did find ulcer  a few months ago. She also notes no BM in 2 weeks and has not had colonoscopy recently and endorsed blood in stools in the past -no signs of obstruction so trial of bowel regimen/laxatives are reasonable   H/o RNY Gastric bypass with multiple revisions for marginal ulcer. G J with antecolic roux limb appears normal on CT though lack of oral contrast is limiting, JJ in appropriate position without evidence of mesenteric twisting -on double strength PPI and carafate slurry -multi-vitamin BID and calcium TID -patient would benefit from outpatient referral to tertiary care facility for evaluation of further surgical management of this problem given history and recurrence for possible reversal vs gastrectomy, etc. -prealbumin is 15.  Nutrition is vital.  Adv to soft diet and add nutritional supplements.  Didn't like ensure, resource breeze being juice like may  not do well in the setting of an ulcer.  Will try carnation instant breakfast vs magic cup.  Whatever she tolerates is fine to help with nutritional support. -nothing else further to offer currently from a surgical standpoint.  FEN - soft diet, carnation instant breakfast vs magic cup VTE - none currently ID - none currently POC - Daughter Deanna Spencer - [phone] ( will try to call her again today)   LOS: 3 days    Henreitta Cea , [facility] Surgery 05/08/2018, 10:22 AM Pager: [phone]

## 2018-05-09 ENCOUNTER — Inpatient Hospital Stay (HOSPITAL_COMMUNITY): Payer: Medicare Other

## 2018-05-09 LAB — BASIC METABOLIC PANEL
Anion gap: 10 (ref 5–15)
BUN: 12 mg/dL (ref 8–23)
CO2: 27 mmol/L (ref 22–32)
Calcium: 9.8 mg/dL (ref 8.9–10.3)
Chloride: 101 mmol/L (ref 98–111)
Creatinine, Ser: 0.78 mg/dL (ref 0.44–1.00)
GFR calc Af Amer: >60 mL/min (ref >60)
GFR calc non Af Amer: >60 mL/min (ref >60)
Glucose, Bld: 101 mg/dL — ABNORMAL HIGH (ref 70–99)
Potassium: 4 mmol/L (ref 3.5–5.1)
Sodium: 138 mmol/L (ref 135–145)

## 2018-05-09 LAB — COMPREHENSIVE METABOLIC PANEL
ALT: 31 U/L (ref 0–44)
AST: 26 U/L (ref 15–41)
Albumin: 2.7 g/dL — ABNORMAL LOW (ref 3.5–5.0)
Alkaline Phosphatase: 87 U/L (ref 38–126)
Anion gap: 9 (ref 5–15)
BUN: 11 mg/dL (ref 8–23)
CO2: 26 mmol/L (ref 22–32)
Calcium: 9.8 mg/dL (ref 8.9–10.3)
Chloride: 102 mmol/L (ref 98–111)
Creatinine, Ser: 0.86 mg/dL (ref 0.44–1.00)
GFR calc Af Amer: >60 mL/min (ref >60)
GFR calc non Af Amer: >60 mL/min (ref >60)
Glucose, Bld: 102 mg/dL — ABNORMAL HIGH (ref 70–99)
Potassium: 4 mmol/L (ref 3.5–5.1)
Sodium: 137 mmol/L (ref 135–145)
Total Bilirubin: 0.4 mg/dL (ref 0.3–1.2)
Total Protein: 6 g/dL — ABNORMAL LOW (ref 6.5–8.1)

## 2018-05-09 LAB — CBC
HCT: 26.2 % — ABNORMAL LOW (ref 36.0–46.0)
Hemoglobin: 8.3 g/dL — ABNORMAL LOW (ref 12.0–15.0)
MCH: 29.7 pg (ref 26.0–34.0)
MCHC: 31.7 g/dL (ref 30.0–36.0)
MCV: 93.9 fL (ref 80.0–100.0)
Platelets: 562 10*3/uL — ABNORMAL HIGH (ref 150–400)
RBC: 2.79 MIL/uL — ABNORMAL LOW (ref 3.87–5.11)
RDW: 15.6 % — ABNORMAL HIGH (ref 11.5–15.5)
WBC: 6.9 10*3/uL (ref 4.0–10.5)
nRBC: 0.3 % — ABNORMAL HIGH (ref 0.0–0.2)

## 2018-05-09 LAB — CBC WITH DIFFERENTIAL/PLATELET
Abs Immature Granulocytes: 0.07 10*3/uL (ref 0.00–0.07)
Basophils Absolute: 0 10*3/uL (ref 0.0–0.1)
Basophils Relative: 0 %
Eosinophils Absolute: 0.1 10*3/uL (ref 0.0–0.5)
Eosinophils Relative: 2 %
HCT: 26.1 % — ABNORMAL LOW (ref 36.0–46.0)
Hemoglobin: 8.3 g/dL — ABNORMAL LOW (ref 12.0–15.0)
Immature Granulocytes: 1 %
Lymphocytes Relative: 22 %
Lymphs Abs: 1.4 10*3/uL (ref 0.7–4.0)
MCH: 29.7 pg (ref 26.0–34.0)
MCHC: 31.8 g/dL (ref 30.0–36.0)
MCV: 93.5 fL (ref 80.0–100.0)
Monocytes Absolute: 0.8 10*3/uL (ref 0.1–1.0)
Monocytes Relative: 12 %
Neutro Abs: 3.9 10*3/uL (ref 1.7–7.7)
Neutrophils Relative %: 63 %
Platelets: 568 10*3/uL — ABNORMAL HIGH (ref 150–400)
RBC: 2.79 MIL/uL — ABNORMAL LOW (ref 3.87–5.11)
RDW: 15.4 % (ref 11.5–15.5)
WBC: 6.2 10*3/uL (ref 4.0–10.5)
nRBC: 0.5 % — ABNORMAL HIGH (ref 0.0–0.2)

## 2018-05-09 LAB — TROPONIN I
Troponin I: 0.03 ng/mL (ref <0.03)
Troponin I: <0.03 ng/mL (ref <0.03)

## 2018-05-09 MED ORDER — ACETAMINOPHEN 325 MG PO TABS: 650.0000 mg | ORAL_TABLET | Freq: Four times a day (QID) | ORAL | Status: DC | PRN

## 2018-05-09 MED ORDER — OXYCODONE HCL 5 MG PO TABS
7.5000 mg | ORAL_TABLET | Freq: Four times a day (QID) | ORAL | Status: DC | PRN
  Administered 2018-05-09 – 2018-05-11 (×5): 7.5 mg via ORAL
  Filled 2018-05-09 (×5): qty 2

## 2018-05-09 MED ORDER — RAMELTEON 8 MG PO TABS
8.0000 mg | ORAL_TABLET | Freq: Every day | ORAL | Status: DC
  Administered 2018-05-09 – 2018-05-13 (×5): 8 mg via ORAL
  Filled 2018-05-09 (×6): qty 1

## 2018-05-09 MED ORDER — SODIUM CHLORIDE 0.9 % IV SOLN
INTRAVENOUS | Status: DC | PRN
  Administered 2018-05-10: 06:00:00 via INTRAVENOUS

## 2018-05-09 MED ORDER — POLYVINYL ALCOHOL 1.4 % OP SOLN
1.0000 [drp] | OPHTHALMIC | Status: DC | PRN
  Administered 2018-05-09: 17:00:00 1 [drp] via OPHTHALMIC
  Filled 2018-05-09: qty 15

## 2018-05-09 MED ORDER — MISOPROSTOL 100 MCG PO TABS
100.0000 ug | ORAL_TABLET | Freq: Four times a day (QID) | ORAL | Status: DC
  Administered 2018-05-09 – 2018-05-14 (×20): 100 ug via ORAL
  Filled 2018-05-09 (×21): qty 1

## 2018-05-09 MED ORDER — DIGOXIN 125 MCG PO TABS
0.0625 mg | ORAL_TABLET | Freq: Every day | ORAL | Status: DC
  Administered 2018-05-09: 0.0625 mg via ORAL
  Administered 2018-05-10: 08:00:00 via ORAL
  Administered 2018-05-11 – 2018-05-14 (×4): 0.0625 mg via ORAL
  Filled 2018-05-09 (×6): qty 1

## 2018-05-09 MED ORDER — METOPROLOL TARTRATE 25 MG PO TABS
25.0000 mg | ORAL_TABLET | Freq: Three times a day (TID) | ORAL | Status: DC
  Administered 2018-05-09 – 2018-05-14 (×12): 25 mg via ORAL
  Filled 2018-05-09 (×16): qty 1

## 2018-05-09 MED ORDER — SODIUM CHLORIDE 0.9 % IV BOLUS
500.0000 mL | Freq: Once | INTRAVENOUS | Status: AC
  Administered 2018-05-09: 500 mL via INTRAVENOUS

## 2018-05-09 NOTE — Progress Notes (Signed)
PT Cancellation Note  Patient Details Name: Deanna Spencer MRN: [id number] DOB: 12-29-1940   Cancelled Treatment:    Reason Eval/Treat Not Completed: Medical issues which prohibited therapy Pt refusing stating she hasn't been feeling well today. RN also reporting BP has been running low. Will hold until pt medically appropriate and follow up as schedule allows.   Leighton Ruff, PT, DPT  Acute Rehabilitation Services  Pager: [phone] Office: [phone]    Rudean Hitt 05/09/2018, 3:32 PM

## 2018-05-09 NOTE — Progress Notes (Addendum)
S) A page was received about 1230 for Deanna Spencer saying that she was feeling some discomfort in her chest and was feeling generally unwell in addition to a low blood pressure map in the 60s.  She was seen at bedside and she noted a burning sensation in her epigastrium consistent with previous stomach/chest pain that is worsened this afternoon.  She also reported mild shortness of breath and chest tightness.    O) At that time she had a temperature of 97.5, blood pressure 81/52, heart rate of 81.  Gen: She was resting in bed and appeared uncomfortable.  She was resting with a damp cloth across her forehead though she was not warm. HEENT: Dry mucous membranes, no appreciable JVD Cardiac: Regular rate, irregular rhythm.  No murmurs.  Strong radial pulse. Respiratory: Mildly tachypneic on room air.  No wheezing/rhonchi/rales on auscultation. Abdomen: Not peritoneal Skin: Positive skin tenting Extremities: No lower extremity edema  A/P) she may feel unwell due to hypotension which is potentially due to hypovolemia possibly a cardiac etiology. -EKG ordered -Troponin -500 cc bolus normal saline  Through the afternoon, she did not show significant improvement in her vitals.  Her EKG was remarkable for multiple PVCs.  Telemetry was also remarkable for many PVCs and rate controlled A. fib.  Dr. Doylene Canard was contacted and made the following recommendations: -Chest x-ray to confirm no pulmonary edema -CBC to ensure no bleeding -CMP to monitor electrolytes and albumin -A.m. troponin -A.m. EKG

## 2018-05-09 NOTE — Progress Notes (Signed)
Pt called Rn stating that "she does not feel right" when nurse asked pt what she was feeling pt responded again that something was wrong and she "did not feel right" BP (!) 81/52   Pulse 67   Temp (!) 97.5 F (36.4 C) (Oral)   Resp 16   Ht 4\' 11"  (1.499 m)   Wt 56 kg   SpO2 100%   BMI 24.94 kg/m  Family medicine team notified, will continue to monitor

## 2018-05-09 NOTE — Consult Note (Signed)
Ref: Nuala Alpha, DO   Subjective:  Awake. Tolerating soft diet. Heart rate controlled with digoxin and metoprolol use.  Objective:  Vital Signs in the last 24 hours: Temp:  [97.5 F (36.4 C)-98.2 F (36.8 C)] 97.5 F (36.4 C) (04/29 1215) Pulse Rate:  [61-83] 67 (04/29 0620) Cardiac Rhythm: Atrial fibrillation (04/29 0700) Resp:  [16] 16 (04/28 2133) BP: (81-111)/(52-70) 81/52 (04/29 1215) SpO2:  [98 %-100 %] 100 % (04/29 1215) Weight:  [56 kg] 56 kg (04/29 0500)  Physical Exam: BP Readings from Last 1 Encounters:  05/09/18 (!) 81/52     Wt Readings from Last 1 Encounters:  05/09/18 56 kg    Weight change: 0.091 kg Body mass index is 24.94 kg/m. HEENT: [place]/AT, Eyes-Hazel, PERL, EOMI, Conjunctiva-Pale, Sclera-Non-icteric Neck: No JVD, No bruit, Trachea midline. Lungs:  Clear, Bilateral. Cardiac:  Regular rhythm, normal S1 and S2, no S3. II/VI systolic murmur. Abdomen:  Soft, BS present. Extremities:  No edema present. No cyanosis. No clubbing. CNS: AxOx3, Cranial nerves grossly intact, moves all 4 extremities.  Skin: Warm and dry.   Intake/Output from previous day: No intake/output data recorded.    Lab Results: BMET    Component Value Date/Time   NA 138 05/09/2018 0329   NA 137 05/07/2018 0351   NA 140 05/06/2018 0338   NA 144 07/31/2017 1521   K 4.0 05/09/2018 0329   K 3.6 05/07/2018 0351   K 3.5 05/06/2018 0338   CL 101 05/09/2018 0329   CL 103 05/07/2018 0351   CL 107 05/06/2018 0338   CO2 27 05/09/2018 0329   CO2 22 05/07/2018 0351   CO2 22 05/06/2018 0338   GLUCOSE 101 (H) 05/09/2018 0329   GLUCOSE 88 05/07/2018 0351   GLUCOSE 92 05/06/2018 0338   BUN 12 05/09/2018 0329   BUN 19 05/07/2018 0351   BUN 20 05/06/2018 0338   BUN 20 07/31/2017 1521   CREATININE 0.78 05/09/2018 0329   CREATININE 0.77 05/07/2018 0351   CREATININE 0.78 05/06/2018 0338   CALCIUM 9.8 05/09/2018 0329   CALCIUM 8.3 (L) 05/07/2018 0351   CALCIUM 8.5 (L) 05/06/2018  0338   GFRNONAA >60 05/09/2018 0329   GFRNONAA >60 05/07/2018 0351   GFRNONAA >60 05/06/2018 0338   GFRAA >60 05/09/2018 0329   GFRAA >60 05/07/2018 0351   GFRAA >60 05/06/2018 0338   CBC    Component Value Date/Time   WBC 6.2 05/09/2018 0329   RBC 2.79 (L) 05/09/2018 0329   HGB 8.3 (L) 05/09/2018 0329   HGB 12.0 07/31/2017 1521   HCT 26.1 (L) 05/09/2018 0329   HCT 38.2 07/31/2017 1521   PLT 568 (H) 05/09/2018 0329   PLT 356 07/31/2017 1521   MCV 93.5 05/09/2018 0329   MCV 91 07/31/2017 1521   MCH 29.7 05/09/2018 0329   MCHC 31.8 05/09/2018 0329   RDW 15.4 05/09/2018 0329   RDW 15.2 07/31/2017 1521   LYMPHSABS 1.4 05/09/2018 0329   MONOABS 0.8 05/09/2018 0329   EOSABS 0.1 05/09/2018 0329   BASOSABS 0.0 05/09/2018 0329   HEPATIC Function Panel Recent Labs    07/31/17 1521 05/05/18 1326 05/07/18 0351  PROT 6.4 6.5 5.5*   HEMOGLOBIN A1C No components found for: HGA1C,  MPG CARDIAC ENZYMES No results found for: CKTOTAL, CKMB, CKMBINDEX, TROPONINI BNP No results for input(s): PROBNP in the last 8760 hours. TSH Recent Labs    09/22/17 0000  TSH 0.654   CHOLESTEROL Recent Labs    05/05/18 1845  CHOL 114    Scheduled Meds: . sodium chloride   Intravenous Once  . calcium citrate  500 mg of elemental calcium Oral TID  . digoxin  0.0625 mg Oral Daily  . famotidine  40 mg Oral QHS  . fesoterodine  4 mg Oral Daily  . lactose free nutrition  237 mL Oral TID WC  . metoprolol tartrate  25 mg Oral TID  . multivitamin  1 tablet Oral BID  . pantoprazole  40 mg Oral BID  . ramelteon  8 mg Oral QHS  . rOPINIRole  0.5 mg Oral QPC lunch  . rOPINIRole  1 mg Oral QHS  . sodium chloride flush  3 mL Intravenous Q12H  . 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 1/2 tablet daily. Metoprolol 25 mg. 3 times daily. F/U in 1 week.   LOS: 4 days    Dixie Dials  MD  05/09/2018, 12:21 PM

## 2018-05-09 NOTE — Progress Notes (Signed)
CRITICAL VALUE ALERT  Critical Value: Trop : 0.03  Date & Time Notied:  05/09/18 @ 3601  Provider Notified: Dr. Ouida Sills   Orders Received/Actions taken: This RN notified primary RN, Junie Panning. MD notified. RN to continue to monitor.-

## 2018-05-09 NOTE — Progress Notes (Signed)
OT Cancellation Note  Patient Details Name: Deanna Spencer MRN: [id number] DOB: November 20, 1940   Cancelled Treatment:    Reason Eval/Treat Not Completed: Patient not medically ready(BP low and pt refused therapy politely.)   [name]) [name] OTR/L Acute Rehabilitation Services Pager: [phone] Office: [phone]   Audie Pinto 05/09/2018, 3:37 PM

## 2018-05-09 NOTE — Progress Notes (Signed)
Family Medicine Teaching Service Daily Progress Note Intern Pager: [phone]  Patient name: Deanna Spencer Medical record number: [id number] Date of birth: September 22, 1940 Age: 78 y.o. Gender: female  Primary Care Provider: Nuala Alpha, DO Consultants: gen surg,GI Code Status: partial, no CPR but wants intubation  Pt Overview and Major Events to Date:  4/25 admission 4/26 1u pRBC, upper endoscopy   Assessment and Plan: Deanna Spencer is a 77 y.o. female presenting with abdominal pain. PMH is significant for Roux-en-Y surgery with multiple claimed revisions, multiple bleeding ulcers and history, repeated diverticulitis, history of MI, pacemaker, A. fib with no anticoagulation, overactive bladder, HFrEF 30-35%,restless leg, hyperlipidemia, osteoarthritis with surgery of the right hip, AAA (stable 3.3cm).  Acute on chronic abdominal pain 2/2 anastomosis gastrojejunal ulcer/?ischemic: Improving.  EGD reveals unhealthy mucosa at the borders of the gastrojejunal anastomosis.  Per vascular, this is unlikely due to to poor blood flow through the celiac trunk.  Surgery is recommended specialty care with previous surgeon in [place] or potentially [facility] at a larger [employer]. - Surgery on board, recommending tertiary care outpatient - Carafate 4 times daily - PPI twice daily - Pepcid 40 mg daily - Transition IV morphine to oxycodone 5 mg every 6 as needed  Nutrition in the setting of gastric bypass surgery - Multivitamin twice daily - Calcium supplementation 3 times daily - Advance diet as tolerated, currently full liquid - Ensure currently, will switch to different  supplementation as she does not like Ensure   Normocytic anemia: Acute on chronic, stable. Hemoglobin 8.3 in the morning of 4/29.  S/p 1 unit packed RBCs on 4/26.  Anticoagulation held in the setting of acute GI bleed.  Iron studies consistent with anemia of chronic disease with a likely iron deficiency anemia  contribution.  Folic acid and L79 within normal limits. - Monitor CBC  - May need IV iron on a long-term basis  Atrial fibrillation not on chronic anticoagulation: Stable. HR 70-80s this morning.  In A. fib with RVR yesterday after metoprolol was held for lower blood pressures.  Cardiology on board, started digoxin on 4/27 and split her metoprolol into 4 daily doses. - Cardiology on board, appreciate recommendations - Metoprolol 25 mg 4 times daily - Digoxin 0.25 mg - Not an anticoagulation candidate with history of severe bleeds  CAD  HFrEF 30-35%: Chronic, stable.  Euvolemic.  On metoprolol as above.  Rate control medication and heart failure regimen limited by EF and lower blood pressures. - Continue metoprolol as above - Monitor fluid status - Telemetry  Overactive bladder: Chronic, stable.  - Continue Toviaz  Restless leg syndrome: No current symptomatology - Continue home Ropinirole   FEN/GI: Advance to soft diet  Prophylaxis: SCDs  Disposition: Improving, disposition pending on tolerating appropriate diet and pain control  Subjective:  She reports trouble sleeping overnight, dry eyes and continued severe abdominal cramping.  She reports that her abdominal pain is a 6/10.  She reports "if I had a knife, I would cut it out myself ".  She denied nausea/vomiting, shortness of breath.  Objective: Temp:  [98.2 F (36.8 C)] 98.2 F (36.8 C) (04/28 2133) Pulse Rate:  [61-83] 83 (04/28 2134) Resp:  [16] 16 (04/28 2133) BP: (86-111)/(58-70) 86/65 (04/28 2133) SpO2:  [98 %] 98 % (04/28 2133) Weight:  [56 kg] 56 kg (04/29 0500)  Physical Exam:  General: Alert and cooperative and appears to be in no acute distress Cardio: Normal S1 and S2, no S3  or S4. Rhythm is regular. No murmurs or rubs.   Pulm: Clear to auscultation bilaterally, no crackles, wheezing, or diminished breath sounds. Normal respiratory effort Abdomen: Bowel sounds normal. Abdomen soft.  Significantly  tender to palpation left upper quadrant. Extremities: No peripheral edema. Warm/ well perfused.  Strong radial pulses. Neuro: Cranial nerves grossly intact   Laboratory: Recent Labs  Lab 05/06/18 0338  05/07/18 0351 05/07/18 1842 05/08/18 0335 05/09/18 0329  WBC 13.8*  --  10.4  --   --  6.2  HGB 6.8*   < > 7.7* 7.9* 7.6* 8.3*  HCT 21.7*   < > 23.7* 24.3* 23.3* 26.1*  PLT 627*  --  553*  --   --  568*   < > = values in this interval not displayed.   Recent Labs  Lab 05/05/18 1326 05/06/18 0338 05/07/18 0351 05/09/18 0329  NA 137 140 137 138  K 3.4* 3.5 3.6 4.0  CL 103 107 103 101  CO2 17* 22 22 27   BUN 25* 20 19 12   CREATININE 0.78 0.78 0.77 0.78  CALCIUM 9.1 8.5* 8.3* 9.8  PROT 6.5  --  5.5*  --   BILITOT 0.7  --  0.8  --   ALKPHOS 70  --  91  --   ALT 70*  --  47*  --   AST 29  --  39  --   GLUCOSE 141* 92 88 101*    Imaging/Diagnostic Tests: No results found.   Matilde Haymaker, MD 05/09/2018, 6:10 AM PGY-1, [place] Intern pager: [phone], text pages welcome

## 2018-05-09 NOTE — Progress Notes (Signed)
Called Dr. Vicenta Aly from [facility] to discuss referral vs transfer and they would like to proceed with the plan as an outpatient f/u.   I have emailed the patient's information to [email or web address] so they can call her and arrange a video "first visit" to evaluate options and review the patient's history before having them drive all the way across town.  This might be able to happen as soon as 4/30 and could still take place if patient is in the hospital at the time.  They advised continuing with sucralfate/ppi and adding misoprostol.  -Dr. Criss Rosales

## 2018-05-10 LAB — CBC
HCT: 23.1 % — ABNORMAL LOW (ref 36.0–46.0)
HCT: 23.5 % — ABNORMAL LOW (ref 36.0–46.0)
Hemoglobin: 7.2 g/dL — ABNORMAL LOW (ref 12.0–15.0)
Hemoglobin: 7.3 g/dL — ABNORMAL LOW (ref 12.0–15.0)
MCH: 29.9 pg (ref 26.0–34.0)
MCH: 29.4 pg (ref 26.0–34.0)
MCHC: 31.2 g/dL (ref 30.0–36.0)
MCHC: 31.1 g/dL (ref 30.0–36.0)
MCV: 95.9 fL (ref 80.0–100.0)
MCV: 94.8 fL (ref 80.0–100.0)
Platelets: 462 10*3/uL — ABNORMAL HIGH (ref 150–400)
Platelets: 445 10*3/uL — ABNORMAL HIGH (ref 150–400)
RBC: 2.41 MIL/uL — ABNORMAL LOW (ref 3.87–5.11)
RBC: 2.48 MIL/uL — ABNORMAL LOW (ref 3.87–5.11)
RDW: 15.7 % — ABNORMAL HIGH (ref 11.5–15.5)
RDW: 15.5 % (ref 11.5–15.5)
WBC: 6.5 10*3/uL (ref 4.0–10.5)
WBC: 6.7 10*3/uL (ref 4.0–10.5)
nRBC: 0.3 % — ABNORMAL HIGH (ref 0.0–0.2)
nRBC: 0 % (ref 0.0–0.2)

## 2018-05-10 LAB — COMPREHENSIVE METABOLIC PANEL
ALT: 26 U/L (ref 0–44)
AST: 19 U/L (ref 15–41)
Albumin: 2.4 g/dL — ABNORMAL LOW (ref 3.5–5.0)
Alkaline Phosphatase: 67 U/L (ref 38–126)
Anion gap: 11 (ref 5–15)
BUN: 10 mg/dL (ref 8–23)
CO2: 26 mmol/L (ref 22–32)
Calcium: 9.7 mg/dL (ref 8.9–10.3)
Chloride: 103 mmol/L (ref 98–111)
Creatinine, Ser: 0.83 mg/dL (ref 0.44–1.00)
GFR calc Af Amer: >60 mL/min (ref >60)
GFR calc non Af Amer: >60 mL/min (ref >60)
Glucose, Bld: 88 mg/dL (ref 70–99)
Potassium: 4 mmol/L (ref 3.5–5.1)
Sodium: 140 mmol/L (ref 135–145)
Total Bilirubin: 0.3 mg/dL (ref 0.3–1.2)
Total Protein: 5.2 g/dL — ABNORMAL LOW (ref 6.5–8.1)

## 2018-05-10 LAB — CULTURE, BLOOD (ROUTINE X 2)
Culture: NO GROWTH
Culture: NO GROWTH
Special Requests: ADEQUATE
Special Requests: ADEQUATE

## 2018-05-10 LAB — TROPONIN I: Troponin I: <0.03 ng/mL (ref <0.03)

## 2018-05-10 MED ORDER — SODIUM CHLORIDE 0.9 % IV SOLN
510.0000 mg | Freq: Once | INTRAVENOUS | Status: AC
  Administered 2018-05-10: 17:00:00 510 mg via INTRAVENOUS
  Filled 2018-05-10: qty 17

## 2018-05-10 NOTE — Progress Notes (Signed)
Benign ulcer that looks ischemic Patient hospitalized - no need for letter

## 2018-05-10 NOTE — Care Management Important Message (Signed)
Important Message  Patient Details  Name: Deanna Spencer MRN: [id number] Date of Birth: 1940/02/22   Medicare Important Message Given:  Yes    Catie Chiao 05/10/2018, 2:14 PM

## 2018-05-10 NOTE — Progress Notes (Signed)
Patient ID: Deanna Spencer, female   DOB: 18-Apr-1940, 78 y.o.   MRN: [id number]    4 Days Post-Op  Subjective: Patient sleepy and is confused why she can't have solid food.  C/o some chest pain overnight.  Objective: Vital signs in last 24 hours: Temp:  [97.5 F (36.4 C)-98.7 F (37.1 C)] 98.4 F (36.9 C) (04/30 0515) Pulse Rate:  [65-105] 81 (04/30 1132) Resp:  [16-24] 16 (04/30 0515) BP: (71-119)/(42-98) 108/52 (04/30 1132) SpO2:  [94 %-100 %] 94 % (04/30 1132) Weight:  [56.9 kg] 56.9 kg (04/30 0500) Last BM Date: 04/25/18  Intake/Output from previous day: 04/29 0701 - 04/30 0700 In: 836.8 [P.O.:720; I.V.:116.4; IV Piggyback:0.4] Out: -  Intake/Output this shift: Total I/O In: 120 [P.O.:120] Out: -   PE: Abd: soft, mildly epigastric tenderness, +BS, ND  Lab Results:  Recent Labs    05/09/18 1737 05/10/18 0406  WBC 6.9 6.5  HGB 8.3* 7.2*  HCT 26.2* 23.1*  PLT 562* 462*   BMET Recent Labs    05/09/18 1737 05/10/18 0406  NA 137 140  K 4.0 4.0  CL 102 103  CO2 26 26  GLUCOSE 102* 88  BUN 11 10  CREATININE 0.86 0.83  CALCIUM 9.8 9.7   PT/INR No results for input(s): LABPROT, INR in the last 72 hours. CMP     Component Value Date/Time   NA 140 05/10/2018 0406   NA 144 07/31/2017 1521   K 4.0 05/10/2018 0406   CL 103 05/10/2018 0406   CO2 26 05/10/2018 0406   GLUCOSE 88 05/10/2018 0406   BUN 10 05/10/2018 0406   BUN 20 07/31/2017 1521   CREATININE 0.83 05/10/2018 0406   CALCIUM 9.7 05/10/2018 0406   PROT 5.2 (L) 05/10/2018 0406   PROT 6.4 07/31/2017 1521   ALBUMIN 2.4 (L) 05/10/2018 0406   ALBUMIN 3.7 07/31/2017 1521   AST 19 05/10/2018 0406   ALT 26 05/10/2018 0406   ALKPHOS 67 05/10/2018 0406   BILITOT 0.3 05/10/2018 0406   BILITOT <0.2 07/31/2017 1521   GFRNONAA >60 05/10/2018 0406   GFRAA >60 05/10/2018 0406   Lipase     Component Value Date/Time   LIPASE 27 05/05/2018 1326       Studies/Results: Dg Chest 1 View  Result Date:  05/09/2018 CLINICAL DATA:  78 year old female with shortness of breath EXAM: CHEST  1 VIEW COMPARISON:  05/05/2018 FINDINGS: Cardiomediastinal silhouette unchanged. No evidence of interlobular septal thickening. No pleural effusion.  No pneumothorax. Coarsened interstitial markings throughout. Right upper extremity PICC appears to terminate superior vena cava, though has been withdrawn in the interval. Unchanged left chest wall cardiac pacing device with 2 leads. Bilateral shoulder arthroplasty. IMPRESSION: Chronic changes without evidence of acute cardiopulmonary disease. Right upper extremity PICC appears to terminate superior vena cava, though has been slightly withdrawn from the comparison. Electronically Signed   By: Corrie Mckusick D.O.   On: 05/09/2018 19:42    Anti-infectives: Anti-infectives (From admission, onward)   Start     Dose/Rate Route Frequency Ordered Stop   05/06/18 0300  piperacillin-tazobactam (ZOSYN) IVPB 3.375 g  Status:  Discontinued     3.375 g 12.5 mL/hr over 240 Minutes Intravenous Every 8 hours 05/05/18 1809 05/05/18 1810   05/05/18 1900  piperacillin-tazobactam (ZOSYN) IVPB 3.375 g  Status:  Discontinued     3.375 g 100 mL/hr over 30 Minutes Intravenous  Once 05/05/18 1809 05/05/18 1809   05/05/18 1900  piperacillin-tazobactam (ZOSYN) IVPB 3.375 g  Status:  Discontinued     3.375 g 12.5 mL/hr over 240 Minutes Intravenous Every 8 hours 05/05/18 1810 05/06/18 1116       Assessment/Plan Atrial fibrillation (HCC) Chronic systolic heart failure (HCC)  Chronic abdominal pain- patient states it is different pain in nature from marginal ulcer though last EGD did find ulcer a few months ago. She also notes no BM in 2 weeks and has not had colonoscopy recently and endorsed blood in stools in the past -no signs of obstruction so trial of bowel regimen/laxatives are reasonable   H/o RNY Gastric bypass with multiple revisions for marginal ulcer. G J with antecolic  roux limb appears normal on CT though lack of oral contrast is limiting, JJ in appropriate position without evidence of mesenteric twisting -on double strength PPI and carafate slurry -multi-vitamin BID and calcium TID -patient would benefit from outpatient referral to tertiary care facility for evaluation of further surgical management of this problem given history and recurrence for possible reversal vs gastrectomy, etc.  Discussed possible surgeon and place with primary service for referral. -prealbumin is 15.  Nutrition is vital.   -she may have solid food from our standpoint, whatever she tolerates.  Protein shakes etc to help keep her nutrition up. -nothing else further to offer currently from a surgical standpoint.  We will sign off.  She does not need surgical follow up with our office.  FEN -FLD (per primary service), carnation instant breakfast vs magic cup VTE -none currently ID -none currently POC - Daughter Brodi Nery - [phone]    LOS: 5 days    Henreitta Cea , [facility] Surgery 05/10/2018, 11:53 AM Pager: [phone]

## 2018-05-10 NOTE — Progress Notes (Signed)
Family Medicine Teaching Service Daily Progress Note Intern Pager: [phone]  Patient name: Deanna Spencer Medical record number: [id number] Date of birth: 1940-03-20 Age: 78 y.o. Gender: female  Primary Care Provider: Nuala Alpha, DO Consultants: gen surg,GI Code Status: partial, no CPR but wants intubation  Pt Overview and Major Events to Date:  4/25 admission 4/26 1u pRBC, upper endoscopy   Assessment and Plan: Deanna Spencer is a 78 y.o. female presenting with abdominal pain. PMH is significant for Roux-en-Y surgery with multiple claimed revisions, multiple bleeding ulcers and history, repeated diverticulitis, history of MI, pacemaker, A. fib with no anticoagulation, overactive bladder, HFrEF 30-35%,restless leg, hyperlipidemia, osteoarthritis with surgery of the right hip, AAA (stable 3.3cm).  Acute on chronic abdominal pain 2/2 anastomosis gastrojejunal ulcer/?ischemic: Improving.  Minimal abdominal pain this am, tolerated breakfast w/o concern.  Patient awaiting video consultation with Dr. Vicenta Aly, [facility], for potential reversal of bypass outpatient.   - Surgery signed off, recommending tertiary care outpatient - Carafate 4 times daily - PPI twice daily - Pepcid 40 mg daily -Added misoprostol per surgery recommendations - Transition to oxycodone 7.5 to 5 mg every 6  Nutrition in the setting of gastric bypass surgery: Stable. - Multivitamin twice daily - Calcium supplementation 3 times daily - Advance diet as tolerated, currently full liquid - Boost supplementation 3 times daily with meals   Normocytic anemia: Acute on chronic, stable. Hemoglobin 7.2 this morning, from 8.3.  S/p 1 unit packed RBCs on 4/26 and feraheme 4/27.  Likely iron deficiency anemia with anemia of chronic disease.  No evidence of blood loss, however must consider in the setting of likely ischemic ulcer and history of blood loss.  Not on anticoagulation. - Monitor CBC, repeat CBC around  1500 --IV feraheme today, 4/30  - May need IV iron on a long-term basis  Chest pain: resolved.  Occurred yesterday afternoon, likely related to GI with burning in nature. CXR wnl. EKG showing afib w/ PVCs. Troponin trended flat.  --Monitor for sx   Atrial fibrillation not on chronic anticoagulation: Stable. HR 70-80s this morning.  In A. fib with RVR yesterday after metoprolol was held for lower blood pressures.  Cardiology on board, started digoxin on 4/27 and metoprolol split into 3 doses daily - Cardiology on board, appreciate recommendations - Metoprolol 25 mg 3 times daily - Digoxin 0.25 mg - Not an anticoagulation candidate with history of severe bleeds  CAD  HFrEF 30-35%: Chronic, stable.  Euvolemic.  On metoprolol as above.  Rate control medication and heart failure regimen limited by EF and lower blood pressures. - Continue metoprolol as above - Monitor fluid status - Telemetry  Overactive bladder: Chronic, stable.  - Continue Toviaz  Restless leg syndrome: No current symptomatology - Continue home Ropinirole   Constipation:  --Miralax  --Senna   FEN/GI: Maintain full liquid diet Prophylaxis: SCDs  Disposition: potential d/c home this afternoon if hemoglobin remains stable, tolerating diet with close f/u   Subjective:  Doing well this morning.  Says her abdomen is feeling "okay."She was able to tolerate her grits and soup this morning without concern.  No longer having any chest pains, however states her heart palpitations overnight were keeping her awake.  She is excited to hear from the bariatric surgeon at [facility], however told him to call her daughter first so they can discuss talking with her previous bariatric surgeon in [place].   Objective: Temp:  [97.5 F (36.4 C)-98.7 F (37.1 C)] 98.4 F (  36.9 C) (04/30 0515) Pulse Rate:  [65-105] 70 (04/30 0515) Resp:  [16-24] 16 (04/30 0515) BP: (71-119)/(42-98) 98/67 (04/30 0515) SpO2:  [94 %-100 %] 97 %  (04/30 0515) Weight:  [56.9 kg] 56.9 kg (04/30 0500)  Physical Exam:  General: Alert, NAD HEENT: NCAT, MMM, oropharynx nonerythematous  Cardiac: Irregular rate and rhythm, no murmurs, gallops or rubs. Lungs: Clear bilaterally, no increased WOB  Abdomen: soft, non-tender, non-distended, normoactive BS Ext: Warm, dry, 2+ distal pulses, no edema   Laboratory: Recent Labs  Lab 05/09/18 0329 05/09/18 1737 05/10/18 0406  WBC 6.2 6.9 6.5  HGB 8.3* 8.3* 7.2*  HCT 26.1* 26.2* 23.1*  PLT 568* 562* 462*   Recent Labs  Lab 05/07/18 0351 05/09/18 0329 05/09/18 1737 05/10/18 0406  NA 137 138 137 140  K 3.6 4.0 4.0 4.0  CL 103 101 102 103  CO2 22 27 26 26   BUN 19 12 11 10   CREATININE 0.77 0.78 0.86 0.83  CALCIUM 8.3* 9.8 9.8 9.7  PROT 5.5*  --  6.0* 5.2*  BILITOT 0.8  --  0.4 0.3  ALKPHOS 91  --  87 67  ALT 47*  --  31 26  AST 39  --  26 19  GLUCOSE 88 101* 102* 88    Imaging/Diagnostic Tests: Dg Chest 1 View  Result Date: 05/09/2018 CLINICAL DATA:  78 year old female with shortness of breath EXAM: CHEST  1 VIEW COMPARISON:  05/05/2018 FINDINGS: Cardiomediastinal silhouette unchanged. No evidence of interlobular septal thickening. No pleural effusion.  No pneumothorax. Coarsened interstitial markings throughout. Right upper extremity PICC appears to terminate superior vena cava, though has been withdrawn in the interval. Unchanged left chest wall cardiac pacing device with 2 leads. Bilateral shoulder arthroplasty. IMPRESSION: Chronic changes without evidence of acute cardiopulmonary disease. Right upper extremity PICC appears to terminate superior vena cava, though has been slightly withdrawn from the comparison. Electronically Signed   By: Corrie Mckusick D.O.   On: 05/09/2018 19:42     Patriciaann Clan, DO 05/10/2018, 7:05 AM PGY-1, [place] Intern pager: [phone], text pages welcome

## 2018-05-10 NOTE — TOC Initial Note (Signed)
Transition of Care [facility]) - Initial/Assessment Note    Patient Details  Name: Deanna Spencer MRN: [id number] Date of Birth: [birth year]  Transition of Care [facility]) CM/SW Contact:    Sharin Mons, RN Phone Number: 05/10/2018, 3:22 PM  Clinical Narrative:   Presents with ABD pain. From home with daughter. States prior to hospital visit independent with ADL's, uses cane intermittently with ambulation.           Renea Schoonmaker (Daughter)      [phone]      PT recommends home health services @ d/c. Pt/daughter agreeable to [facility] services. Choice list provided. [facility] selected...Marland KitchenMarland KitchenLiasion following for [facility] .services   NCM will continue to monitor for TOC needs .Marland Kitchen...  Expected Discharge Plan: [place] [facility](Resides with daughter)     Patient Goals and CMS Choice Patient states their goals for this hospitalization and ongoing recovery are:: to get better CMS Medicare.gov Compare Post Acute Care list provided to:: Patient Choice offered to / list presented to : Patient  Expected Discharge Plan and Services Expected Discharge Plan: [place] [facility](Resides with daughter)   Discharge Planning Services: CM Consult   Living arrangements for the past 2 months: [place]                   DME agency:                  Prior Living Arrangements/Services Living arrangements for the past 2 months: Single Family Home Lives with:: Adult Children   Do you feel safe going back to the place where you live?: Yes      Need for Family Participation in Patient Care: No (Comment) Care giver support system in place?: Yes (comment)      Activities of Daily Living Home Assistive Devices/Equipment: Eyeglasses, Hearing aid ADL Screening (condition at time of admission) Patient's cognitive ability adequate to safely complete daily activities?: Yes Is the patient deaf or have difficulty hearing?: Yes Does the patient have difficulty seeing,  even when wearing glasses/contacts?: No Does the patient have difficulty concentrating, remembering, or making decisions?: No Patient able to express need for assistance with ADLs?: Yes Does the patient have difficulty dressing or bathing?: No Independently performs ADLs?: Yes (appropriate for developmental age) Does the patient have difficulty walking or climbing stairs?: Yes Weakness of Legs: Both Weakness of Arms/Hands: None  Permission Sought/Granted Permission sought to share information with : Case Manager Permission granted to share information with : Yes, Verbal Permission Granted  Share Information with NAME: Carin Schellhorn (Daughter)           Emotional Assessment Appearance:: Appears stated age Attitude/Demeanor/Rapport: Engaged Affect (typically observed): Accepting Orientation: : Oriented to Self, Oriented to Place, Oriented to  Time, Oriented to Situation Alcohol / Substance Use: Not Applicable Psych Involvement: No (comment)  Admission diagnosis:  PUD (peptic ulcer disease) [K27.9] Atrial fibrillation with rapid ventricular response ([place]) [I48.91] Sepsis ([place]) [A41.9] Other iron deficiency anemia [D50.8] Patient Active Problem List   Diagnosis Date Noted  . Anemia due to chronic blood loss 05/07/2018  . Odynophagia 05/07/2018  . Protein-calorie malnutrition ([place]) 05/07/2018  . Absolute anemia   . Cardiac pacemaker in situ 05/06/2018  . History of anastomosis and bypass hemorrhage 05/06/2018  . Atrial fibrillation with rapid ventricular response ([place])   . Dehydration   . Bilious vomiting with nausea   . Gastrojejunal ulcer   . Acute abdominal pain 05/05/2018  .  Rheumatoid arthritis involving both hands ([place]) 09/27/2017  . Seasonal allergies 08/07/2017  . Primary osteoarthritis of right knee 06/02/2017  . Osteoarthritis of right knee 05/30/2017  . Primary osteoarthritis of left hip 02/24/2017  . AAA (abdominal aortic aneurysm) without rupture ([place]) 12/21/2016   . Chronic systolic heart failure ([place]) 12/14/2016  . Atrial fibrillation ([place]) 11/22/2016  . Peripheral neuropathy 11/22/2016  . GERD (gastroesophageal reflux disease) 11/22/2016  . Abdominal pain 11/18/2016  . 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 Parasomnia 11/18/2016   PCP:  Nuala Alpha, DO Pharmacy:   [facility] [place], [place] - [place] [address] AT [place] [address] [place] [address] Phone: [phone] Fax: [phone]  [facility] [place], [place] - [address] [place] [address] Phone: [phone] Fax: [phone]     Social Determinants of Health (SDOH) Interventions    Readmission Risk Interventions No flowsheet data found.

## 2018-05-10 NOTE — Evaluation (Signed)
Occupational Therapy Evaluation Patient Details Name: Deanna Spencer MRN: [id number] DOB: 05/27/1940 Today's Date: 05/10/2018    History of Present Illness Pt is a 78 y/o female admitted secondary to worsening abdominal pain. Pt is s/p EGD which revealed Roux-en-Y gastrojejunostomy with gastrojejunal anastomosis characterized by ulceration. 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 PMH includes AAA, a fib, CHF, osteoporosis, MI, gout, and s/p pacemaker.    Clinical Impression   Pt was independent prior to admission. Presents with generalized weakness. She requires up to min assist for ADL and mobility. Pt is eager to eat solid food. Will follow acutely.     Follow Up Recommendations  Home health OT(may not require follow up therapy)    Equipment Recommendations  None recommended by OT    Recommendations for Other Services       Precautions / Restrictions Precautions Precautions: Fall Restrictions Weight Bearing Restrictions: No      Mobility Bed Mobility Overal bed mobility: Needs Assistance Bed Mobility: Supine to Sit;Sit to Supine     Supine to sit: Supervision Sit to supine: Supervision   General bed mobility comments: for safety  Transfers Overall transfer level: Needs assistance Equipment used: 1 person hand held assist Transfers: Sit to/from Stand Sit to Stand: Min assist         General transfer comment: Min A to rise and steady    Balance Overall balance assessment: Needs assistance   Sitting balance-Leahy Scale: Good       Standing balance-Leahy Scale: Fair Standing balance comment: can stand at sink for hand washing without support                           ADL either performed or assessed with clinical judgement   ADL Overall ADL's : Needs assistance/impaired Eating/Feeding: Independent;Bed level   Grooming: Min guard;Standing;Wash/dry hands   Upper Body Bathing: Set up;Sitting   Lower Body  Bathing: Minimal assistance;Sit to/from stand   Upper Body Dressing : Set up;Sitting   Lower Body Dressing: Minimal assistance;Sit to/from stand   Toilet Transfer: Minimal assistance;Ambulation Toilet Transfer Details (indicate cue type and reason): hand held [place] and Hygiene: Minimal assistance;Sit to/from stand       Functional mobility during ADLs: Minimal assistance(hand held)       Vision Baseline Vision/History: Wears glasses Wears Glasses: Reading only Patient Visual Report: No change from baseline       Perception     Praxis      Pertinent Vitals/Pain Pain Assessment: No/denies pain     Hand Dominance Right   Extremity/Trunk Assessment Upper Extremity Assessment Upper Extremity Assessment: Overall WFL for tasks assessed(arthritic deformities in hands)   Lower Extremity Assessment Lower Extremity Assessment: Defer to PT evaluation   Cervical / Trunk Assessment Cervical / Trunk Assessment: Kyphotic   Communication Communication Communication: No difficulties   Cognition Arousal/Alertness: Awake/alert Behavior During Therapy: Anxious;Restless(pt very fastidious) Overall Cognitive Status: Within Functional Limits for tasks assessed                                     General Comments       Exercises     Shoulder Instructions      Home Living Family/patient expects to be discharged to:: Private residence Living Arrangements: Children Available Help at Discharge: Family;Available 24 hours/day Type of Home: House  Home Access: Stairs to enter [employer] of Steps: 6 Entrance Stairs-Rails: Left;Right Home Layout: One level     Bathroom Shower/Tub: Occupational psychologist: Standard     Home Equipment: Marine scientist - single point          Prior Functioning/Environment Level of Independence: Independent        Comments: Pt reports she was independent, but feels like she  has gotten weaker after multiple hospitalizations.         OT Problem List: Decreased strength;Decreased activity tolerance;Impaired balance (sitting and/or standing)      OT Treatment/Interventions: Self-care/ADL training;Patient/family education;Balance training;Cognitive remediation/compensation;Therapeutic activities    OT Goals(Current goals can be found in the care plan section) Acute Rehab OT Goals Patient Stated Goal: to get rubberbands so she can organize her belongings OT Goal Formulation: With patient Time For Goal Achievement: 05/24/18 Potential to Achieve Goals: Good ADL Goals Pt Will Perform Grooming: with modified independence;standing Pt Will Perform Lower Body Bathing: with modified independence;sit to/from stand Pt Will Perform Lower Body Dressing: with modified independence;sit to/from stand Pt Will Transfer to Toilet: with modified independence;ambulating;regular height toilet Pt Will Perform Toileting - Clothing Manipulation and hygiene: with modified independence;sit to/from stand  OT Frequency: Min 2X/week   Barriers to D/C:            Co-evaluation              AM-PAC OT "6 Clicks" Daily Activity     Outcome Measure Help from another person eating meals?: None Help from another person taking care of personal grooming?: A Little Help from another person toileting, which includes using toliet, bedpan, or urinal?: A Little Help from another person bathing (including washing, rinsing, drying)?: A Little Help from another person to put on and taking off regular upper body clothing?: None Help from another person to put on and taking off regular lower body clothing?: A Little 6 Click Score: 20   End of Session Equipment Utilized During Treatment: Gait belt  Activity Tolerance: Patient limited by fatigue Patient left: in bed;with call bell/phone within reach;with bed alarm set  OT Visit Diagnosis: Muscle weakness (generalized) (M62.81);Unsteadiness  on feet (R26.81)                Time: 5537-4827 OT Time Calculation (min): 15 min Charges:  OT General Charges $OT Visit: 1 Visit OT Evaluation $OT Eval Moderate Complexity: 1 Mod  Nestor Lewandowsky, OTR/L Acute Rehabilitation Services Pager: [phone] Office: [phone]  Malka So 05/10/2018, 12:30 PM

## 2018-05-10 NOTE — Progress Notes (Signed)
05/10/18 1556  PT Visit Information  Last PT Received On 05/10/18  Assistance Needed +1  History of Present Illness Pt is a 78 y/o female admitted secondary to worsening abdominal pain. Pt is s/p EGD which revealed Roux-en-Y gastrojejunostomy with gastrojejunal anastomosis characterized by ulceration. 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 PMH includes AAA, a fib, CHF, osteoporosis, MI, gout, and s/p pacemaker.   Subjective Data  Patient Stated Goal to eat real food  Precautions  Precautions Fall  Restrictions  Weight Bearing Restrictions No  Pain Assessment  Pain Assessment Faces  Faces Pain Scale 4  Pain Location stomach  Pain Descriptors / Indicators Cramping;Aching  Pain Intervention(s) Limited activity within patient's tolerance;Monitored during session;Repositioned  Cognition  Arousal/Alertness Awake/alert  Behavior During Therapy WFL for tasks assessed/performed  Overall Cognitive Status Within Functional Limits for tasks assessed  Bed Mobility  Overal bed mobility Needs Assistance  Bed Mobility Supine to Sit;Sit to Supine  Supine to sit Supervision  Sit to supine Supervision  General bed mobility comments Supervision for safety.   Transfers  Overall transfer level Needs assistance  Equipment used None  Transfers Sit to/from Stand  Sit to Stand Min guard  General transfer comment Min guard for steadying assist.   Ambulation/Gait  Ambulation/Gait assistance Min guard  Gait Distance (Feet) 50 Feet  Assistive device None  Gait Pattern/deviations Step-through pattern;Decreased stride length  General Gait Details Slow, guarded gait. Pt only agreeable to walking laps inside the room because she did not want to walk out in the hallway. Pt reaching out for objects to hold on to, however, refused RW. No overt LOB noted.   Gait velocity Decreased   Balance  Overall balance assessment Needs assistance  Sitting-balance support No upper  extremity supported;Feet supported  Sitting balance-Leahy Scale Good  Standing balance support No upper extremity supported;During functional activity  Standing balance-Leahy Scale Fair  Standing balance comment can stand at sink for hand washing without support and can perform toileting tasks in standing.   PT - End of Session  Equipment Utilized During Treatment Gait belt  Activity Tolerance Patient tolerated treatment well  Patient left in bed;with call bell/phone within reach;with bed alarm set;Other (comment) (with case manager present)  Nurse Communication Mobility status   PT - Assessment/Plan  PT Plan Current plan remains appropriate  PT Visit Diagnosis Unsteadiness on feet (R26.81);Muscle weakness (generalized) (M62.81);Pain  Pain - part of body  (stomach )  PT Frequency (ACUTE ONLY) Min 3X/week  Follow Up Recommendations Home health PT;Supervision for mobility/OOB  PT equipment Rolling walker with 5" wheels  AM-PAC PT "6 Clicks" Mobility Outcome Measure (Version 2)  Help needed turning from your back to your side while in a flat bed without using bedrails? 4  Help needed moving from lying on your back to sitting on the side of a flat bed without using bedrails? 3  Help needed moving to and from a bed to a chair (including a wheelchair)? 3  Help needed standing up from a chair using your arms (e.g., wheelchair or bedside chair)? 3  Help needed to walk in hospital room? 3  Help needed climbing 3-5 steps with a railing?  2  6 Click Score 18  Consider Recommendation of Discharge To: Home with [facility]  PT Goal Progression  Progress towards PT goals Progressing toward goals  Acute Rehab PT Goals  PT Goal Formulation With patient  Time For Goal Achievement 05/22/18  Potential to Achieve  Goals Good  PT Time Calculation  PT Start Time (ACUTE ONLY) 1453  PT Stop Time (ACUTE ONLY) 1519  PT Time Calculation (min) (ACUTE ONLY) 26 min  PT General Charges  $$ ACUTE PT VISIT 1 Visit  PT  Treatments  $Gait Training 8-22 mins  $Therapeutic Activity 8-22 mins   Pt progressing towards goals. Pt increased ambulation distance this session. Pt refusing RW and reaching out to hold onto objects in room. Required min guard A for steadying throughout mobility tasks. Current recommendations appropriate. Will continue to follow acutely to maximize functional mobility independence and safety.   Leighton Ruff, PT, DPT  Acute Rehabilitation Services  Pager: [phone] Office: [phone]

## 2018-05-10 NOTE — Consult Note (Signed)
Ref: Nuala Alpha, DO   Subjective:  Epigastric pain continues. Heart rate and BP are stable. Monitor is atrial fibrillation. Afebrile.  Objective:  Vital Signs in the last 24 hours: Temp:  [97.5 F (36.4 C)-98.7 F (37.1 C)] 98.4 F (36.9 C) (04/30 0515) Pulse Rate:  [65-105] 70 (04/30 0515) Cardiac Rhythm: Atrial fibrillation;Ventricular paced (04/30 0700) Resp:  [16-24] 16 (04/30 0515) BP: (71-119)/(42-98) 98/67 (04/30 0515) SpO2:  [94 %-100 %] 97 % (04/30 0515) Weight:  [56.9 kg] 56.9 kg (04/30 0500)  Physical Exam: BP Readings from Last 1 Encounters:  05/10/18 98/67     Wt Readings from Last 1 Encounters:  05/10/18 56.9 kg    Weight change: 0.907 kg Body mass index is 25.35 kg/m. HEENT: [place]/AT, Eyes-[place], wears glasses, PERL, EOMI, Conjunctiva-Pale, Sclera-Non-icteric Neck: No JVD, No bruit, Trachea midline. Lungs:  Clearing, Bilateral. Cardiac:  Regular rhythm, normal S1 and S2, no S3. II/VI systolic murmur. Abdomen:  Soft, tender epigastric area. BS present. Extremities:  No edema present. No cyanosis. No clubbing. CNS: AxOx3, Cranial nerves grossly intact, moves all 4 extremities.  Skin: Warm and dry.   Intake/Output from previous day: 04/29 0701 - 04/30 0700 In: 836.8 [P.O.:720; I.V.:116.4; IV Piggyback:0.4] Out: -     Lab Results: BMET    Component Value Date/Time   NA 140 05/10/2018 0406   NA 137 05/09/2018 1737   NA 138 05/09/2018 0329   NA 144 07/31/2017 1521   K 4.0 05/10/2018 0406   K 4.0 05/09/2018 1737   K 4.0 05/09/2018 0329   CL 103 05/10/2018 0406   CL 102 05/09/2018 1737   CL 101 05/09/2018 0329   CO2 26 05/10/2018 0406   CO2 26 05/09/2018 1737   CO2 27 05/09/2018 0329   GLUCOSE 88 05/10/2018 0406   GLUCOSE 102 (H) 05/09/2018 1737   GLUCOSE 101 (H) 05/09/2018 0329   BUN 10 05/10/2018 0406   BUN 11 05/09/2018 1737   BUN 12 05/09/2018 0329   BUN 20 07/31/2017 1521   CREATININE 0.83 05/10/2018 0406   CREATININE 0.86 05/09/2018  1737   CREATININE 0.78 05/09/2018 0329   CALCIUM 9.7 05/10/2018 0406   CALCIUM 9.8 05/09/2018 1737   CALCIUM 9.8 05/09/2018 0329   GFRNONAA >60 05/10/2018 0406   GFRNONAA >60 05/09/2018 1737   GFRNONAA >60 05/09/2018 0329   GFRAA >60 05/10/2018 0406   GFRAA >60 05/09/2018 1737   GFRAA >60 05/09/2018 0329   CBC    Component Value Date/Time   WBC 6.5 05/10/2018 0406   RBC 2.41 (L) 05/10/2018 0406   HGB 7.2 (L) 05/10/2018 0406   HGB 12.0 07/31/2017 1521   HCT 23.1 (L) 05/10/2018 0406   HCT 38.2 07/31/2017 1521   PLT 462 (H) 05/10/2018 0406   PLT 356 07/31/2017 1521   MCV 95.9 05/10/2018 0406   MCV 91 07/31/2017 1521   MCH 29.9 05/10/2018 0406   MCHC 31.2 05/10/2018 0406   RDW 15.7 (H) 05/10/2018 0406   RDW 15.2 07/31/2017 1521   LYMPHSABS 1.4 05/09/2018 0329   MONOABS 0.8 05/09/2018 0329   EOSABS 0.1 05/09/2018 0329   BASOSABS 0.0 05/09/2018 0329   HEPATIC Function Panel Recent Labs    05/07/18 0351 05/09/18 1737 05/10/18 0406  PROT 5.5* 6.0* 5.2*   HEMOGLOBIN A1C No components found for: HGA1C,  MPG CARDIAC ENZYMES Lab Results  Component Value Date   TROPONINI <0.03 05/10/2018   TROPONINI <0.03 05/09/2018   TROPONINI 0.03 ([place]) 05/09/2018  BNP No results for input(s): PROBNP in the last 8760 hours. TSH Recent Labs    09/22/17 0000  TSH 0.654   CHOLESTEROL Recent Labs    05/05/18 1845  CHOL 114    Scheduled Meds: . sodium chloride   Intravenous Once  . calcium citrate  500 mg of elemental calcium Oral TID  . digoxin  0.0625 mg Oral Daily  . famotidine  40 mg Oral QHS  . fesoterodine  4 mg Oral Daily  . lactose free nutrition  237 mL Oral TID WC  . metoprolol tartrate  25 mg Oral TID  . misoprostol  100 mcg Oral Q6H  . multivitamin  1 tablet Oral BID  . pantoprazole  40 mg Oral BID  . ramelteon  8 mg Oral QHS  . rOPINIRole  0.5 mg Oral QPC lunch  . rOPINIRole  1 mg Oral QHS  . sodium chloride flush  3 mL Intravenous Q12H  . sucralfate  1 g  Oral Q6H   Continuous Infusions: . 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 Stable from cardiac point to undergo GI surgery if needed.   LOS: 5 days    Dixie Dials  MD  05/10/2018, 11:07 AM

## 2018-05-11 ENCOUNTER — Inpatient Hospital Stay (HOSPITAL_COMMUNITY): Payer: Medicare Other

## 2018-05-11 LAB — MAGNESIUM: Magnesium: 1.7 mg/dL (ref 1.7–2.4)

## 2018-05-11 LAB — COMPREHENSIVE METABOLIC PANEL
ALT: 21 U/L (ref 0–44)
AST: 18 U/L (ref 15–41)
Albumin: 2.4 g/dL — ABNORMAL LOW (ref 3.5–5.0)
Alkaline Phosphatase: 69 U/L (ref 38–126)
Anion gap: 11 (ref 5–15)
BUN: 9 mg/dL (ref 8–23)
CO2: 29 mmol/L (ref 22–32)
Calcium: 9.9 mg/dL (ref 8.9–10.3)
Chloride: 99 mmol/L (ref 98–111)
Creatinine, Ser: 0.91 mg/dL (ref 0.44–1.00)
GFR calc Af Amer: >60 mL/min (ref >60)
GFR calc non Af Amer: >60 mL/min (ref >60)
Glucose, Bld: 87 mg/dL (ref 70–99)
Potassium: 3.7 mmol/L (ref 3.5–5.1)
Sodium: 139 mmol/L (ref 135–145)
Total Bilirubin: 0.8 mg/dL (ref 0.3–1.2)
Total Protein: 5.3 g/dL — ABNORMAL LOW (ref 6.5–8.1)

## 2018-05-11 LAB — CBC
HCT: 22.6 % — ABNORMAL LOW (ref 36.0–46.0)
Hemoglobin: 7.1 g/dL — ABNORMAL LOW (ref 12.0–15.0)
MCH: 30.2 pg (ref 26.0–34.0)
MCHC: 31.4 g/dL (ref 30.0–36.0)
MCV: 96.2 fL (ref 80.0–100.0)
Platelets: 426 10*3/uL — ABNORMAL HIGH (ref 150–400)
RBC: 2.35 MIL/uL — ABNORMAL LOW (ref 3.87–5.11)
RDW: 15.9 % — ABNORMAL HIGH (ref 11.5–15.5)
WBC: 6.7 10*3/uL (ref 4.0–10.5)
nRBC: 0 % (ref 0.0–0.2)

## 2018-05-11 MED ORDER — OXYCODONE HCL 5 MG PO TABS
5.0000 mg | ORAL_TABLET | Freq: Four times a day (QID) | ORAL | Status: DC | PRN
  Administered 2018-05-11 – 2018-05-14 (×10): 5 mg via ORAL
  Filled 2018-05-11 (×10): qty 1

## 2018-05-11 MED ORDER — SENNA 8.6 MG PO TABS
1.0000 | ORAL_TABLET | Freq: Every day | ORAL | Status: DC
  Administered 2018-05-11 – 2018-05-13 (×3): 8.6 mg via ORAL
  Filled 2018-05-11 (×4): qty 1

## 2018-05-11 MED ORDER — POLYETHYLENE GLYCOL 3350 17 G PO PACK
17.0000 g | PACK | Freq: Every day | ORAL | Status: DC
  Administered 2018-05-11: 17 g via ORAL
  Filled 2018-05-11 (×4): qty 1

## 2018-05-11 NOTE — Progress Notes (Signed)
Family Medicine Teaching Service Daily Progress Note Intern Pager: [phone]  Patient name: Deanna Spencer Medical record number: [id number] Date of birth: Apr 06, 1940 Age: 78 y.o. Gender: female  Primary Care Provider: Nuala Alpha, DO Consultants: gen surg,GI Code Status: partial, no CPR but wants intubation  Pt Overview and Major Events to Date:  4/25 admission 4/26 1u pRBC, upper endoscopy   Assessment and Plan: Deanna Spencer is a 78 y.o. female presenting with abdominal pain. PMH is significant for Roux-en-Y surgery with multiple claimed revisions, multiple bleeding ulcers and history, repeated diverticulitis, history of MI, pacemaker, A. fib with no anticoagulation, overactive bladder, HFrEF 30-35%,restless leg, hyperlipidemia, osteoarthritis with surgery of the right hip, AAA (stable 3.3cm).  Acute on chronic abdominal pain 2/2 anastomosis gastrojejunal ulcer/?ischemic: Improving.  Minimal abdominal pain currently. Tolerating full liquid.  Patient awaiting video consultation with Dr. Vicenta Aly, [facility] bariatric surgery, for potential reversal of bypass outpatient. Medically stable, will touch base with daughter and patient to discuss discharge with follow up.    - Surgery signed off, recommending tertiary care outpatient - Carafate 4 times daily - PPI twice daily - Pepcid 40 mg daily -misoprostol per surgery recommendations - Transition to oxycodone 7.5 to 5 mg every 6  Nutrition in the setting of gastric bypass surgery: Stable. - Multivitamin twice daily - Calcium supplementation 3 times daily - Advance diet as tolerated, currently full liquid - Boost supplementation 3 times daily with meals   Normocytic anemia: Acute on chronic, stable. Hemoglobin 7.1, from 7.2 yesterday. S/p 1 unit packed RBCs on 4/26 and feraheme 05/06/28.  Likely iron deficiency anemia with anemia of chronic disease.  No evidence of blood loss, however must consider in the setting of likely ischemic ulcer and  history of blood loss.  Not on anticoagulation. - Monitor CBC --IV feraheme on 4/30 - May need IV iron on a long-term basis   Atrial fibrillation not on chronic anticoagulation: Stable. HR rate controlled.  - Cardiology on board, appreciate recommendations - Metoprolol 25 mg 3 times daily - Digoxin 0.25 mg - Not an anticoagulation candidate with history of severe bleeds  CAD  HFrEF 30-35%: Chronic, stable.  Euvolemic.  On metoprolol as above.  Rate control medication and heart failure regimen limited by EF and lower blood pressures. - Continue metoprolol as above - Monitor fluid status - Telemetry  Overactive bladder: Chronic, stable.  - Continue Toviaz  Restless leg syndrome: No current symptomatology - Continue home Ropinirole   Constipation:  --Miralax  --Senna   FEN/GI: Maintain full liquid diet Prophylaxis: SCDs  Disposition: potential d/c home this afternoon if patient and daughter consent, will discuss with both that patient is medically stable   Subjective:  Doing well this am. No acute events overnight. Says abdominal pain is minimal this am, tired of full liquid diet and wants beef roast. No BM yet. Frustrated that if she goes home her abdominal pain will get worse again.   Objective: Temp:  [98.2 F (36.8 C)-99 F (37.2 C)] 98.2 F (36.8 C) (05/01 0128) Pulse Rate:  [68-81] 68 (05/01 0128) Resp:  [16] 16 (05/01 0128) BP: (86-108)/(40-54) 101/50 (05/01 0128) SpO2:  [94 %-99 %] 96 % (05/01 0128) Weight:  [56.8 kg] 56.8 kg (05/01 0618)  Physical Exam:  General: Alert, NAD HEENT: NCAT, MMM, oropharynx nonerythematous  Cardiac: irregular rate/rhythm no m/g/r Lungs: Clear bilaterally, no increased WOB  Abdomen: soft, non-tender, non-distended, normoactive BS Msk: Moves all extremities spontaneously  Ext: Warm, dry, 2+ distal pulses,  no edema   Laboratory: Recent Labs  Lab 05/10/18 0406 05/10/18 1431 05/11/18 0519  WBC 6.5 6.7 6.7  HGB 7.2* 7.3*  7.1*  HCT 23.1* 23.5* 22.6*  PLT 462* 445* 426*   Recent Labs  Lab 05/09/18 1737 05/10/18 0406 05/11/18 0519  NA 137 140 139  K 4.0 4.0 3.7  CL 102 103 99  CO2 26 26 29   BUN 11 10 9   CREATININE 0.86 0.83 0.91  CALCIUM 9.8 9.7 9.9  PROT 6.0* 5.2* 5.3*  BILITOT 0.4 0.3 0.8  ALKPHOS 87 67 69  ALT 31 26 21   AST 26 19 18   GLUCOSE 102* 88 87    Imaging/Diagnostic Tests: No results found.   Patriciaann Clan, DO 05/11/2018, 6:56 AM PGY-1, [place] Intern pager: [phone], text pages welcome

## 2018-05-11 NOTE — Progress Notes (Signed)
Touched base with Dr. Vicenta Aly, [facility], via email today.  States his office is closed, but will follow up on Monday, 5/4, to get patient set up with appropriate follow-up.  We will let patient know.   In the meantime, awaiting SNF placement.  Patriciaann Clan, DO

## 2018-05-11 NOTE — Progress Notes (Signed)
Patient and daughter do not feel comfortable with going home, daughter does not feel that she can take care of her appropriately. Wish for SNF placement, thus will try towards this.   Additionally, daughter is concerned she hasn't heard from anyone from Dr. Nelle Don office ([facility]) and can't get ahold of him.  The bariatric office initially called the patient on 4/30, who recommended they speak with her daughter instead for consultation.  I will try to contact his office and ensure they are aware of the patient and will provide follow-up.   Patriciaann Clan, DO

## 2018-05-11 NOTE — Consult Note (Signed)
Ref: Nuala Alpha, DO   Subjective:  Feeling depressed over abdominal pain and diet. She is waiting for referral to GI/Surgery at [facility] per conversation with daughter. I have paged resident to convey the message. Patient not tolerating Ensure or boost which increases stomach pain.  Hgb remains low.  Heart rate and blood pressure are stable for now.  Objective:  Vital Signs in the last 24 hours: Temp:  [98.2 F (36.8 C)-99 F (37.2 C)] 98.2 F (36.8 C) (05/01 0128) Pulse Rate:  [68-81] 68 (05/01 0128) Cardiac Rhythm: Atrial fibrillation;Bundle branch block (05/01 0700) Resp:  [16] 16 (05/01 0128) BP: (86-108)/(40-54) 101/50 (05/01 0128) SpO2:  [94 %-99 %] 96 % (05/01 0128) Weight:  [56.8 kg] 56.8 kg (05/01 0618)  Physical Exam: BP Readings from Last 1 Encounters:  05/11/18 (!) 101/50     Wt Readings from Last 1 Encounters:  05/11/18 56.8 kg    Weight change: -0.091 kg Body mass index is 25.31 kg/m. HEENT: [place]/AT, Eyes-Hazel, wears glasses, Conjunctiva-Pink, Sclera-Non-icteric Neck: No JVD, No bruit, Trachea midline. Lungs:  Clear, Bilateral. Cardiac:  Irregular rhythm, normal S1 and S2, no S3. II/VI systolic murmur. Abdomen:  Soft, epigastric area-tender. BS present. Extremities:  No edema present. No cyanosis. No clubbing. CNS: AxOx3, Cranial nerves grossly intact, moves all 4 extremities.  Skin: Warm and dry.   Intake/Output from previous day: 04/30 0701 - 05/01 0700 In: 1225 [P.O.:900; I.V.:208; IV Piggyback:117] Out: 1050 [Urine:1050]    Lab Results: BMET    Component Value Date/Time   NA 139 05/11/2018 0519   NA 140 05/10/2018 0406   NA 137 05/09/2018 1737   NA 144 07/31/2017 1521   K 3.7 05/11/2018 0519   K 4.0 05/10/2018 0406   K 4.0 05/09/2018 1737   CL 99 05/11/2018 0519   CL 103 05/10/2018 0406   CL 102 05/09/2018 1737   CO2 29 05/11/2018 0519   CO2 26 05/10/2018 0406   CO2 26 05/09/2018 1737   GLUCOSE 87 05/11/2018 0519   GLUCOSE 88  05/10/2018 0406   GLUCOSE 102 (H) 05/09/2018 1737   BUN 9 05/11/2018 0519   BUN 10 05/10/2018 0406   BUN 11 05/09/2018 1737   BUN 20 07/31/2017 1521   CREATININE 0.91 05/11/2018 0519   CREATININE 0.83 05/10/2018 0406   CREATININE 0.86 05/09/2018 1737   CALCIUM 9.9 05/11/2018 0519   CALCIUM 9.7 05/10/2018 0406   CALCIUM 9.8 05/09/2018 1737   GFRNONAA >60 05/11/2018 0519   GFRNONAA >60 05/10/2018 0406   GFRNONAA >60 05/09/2018 1737   GFRAA >60 05/11/2018 0519   GFRAA >60 05/10/2018 0406   GFRAA >60 05/09/2018 1737   CBC    Component Value Date/Time   WBC 6.7 05/11/2018 0519   RBC 2.35 (L) 05/11/2018 0519   HGB 7.1 (L) 05/11/2018 0519   HGB 12.0 07/31/2017 1521   HCT 22.6 (L) 05/11/2018 0519   HCT 38.2 07/31/2017 1521   PLT 426 (H) 05/11/2018 0519   PLT 356 07/31/2017 1521   MCV 96.2 05/11/2018 0519   MCV 91 07/31/2017 1521   MCH 30.2 05/11/2018 0519   MCHC 31.4 05/11/2018 0519   RDW 15.9 (H) 05/11/2018 0519   RDW 15.2 07/31/2017 1521   LYMPHSABS 1.4 05/09/2018 0329   MONOABS 0.8 05/09/2018 0329   EOSABS 0.1 05/09/2018 0329   BASOSABS 0.0 05/09/2018 0329   HEPATIC Function Panel Recent Labs    05/09/18 1737 05/10/18 0406 05/11/18 0519  PROT 6.0* 5.2* 5.3*  HEMOGLOBIN A1C No components found for: HGA1C,  MPG CARDIAC ENZYMES Lab Results  Component Value Date   TROPONINI <0.03 05/10/2018   TROPONINI <0.03 05/09/2018   TROPONINI 0.03 (HH) 05/09/2018   BNP No results for input(s): PROBNP in the last 8760 hours. TSH Recent Labs    09/22/17 0000  TSH 0.654   CHOLESTEROL Recent Labs    05/05/18 1845  CHOL 114    Scheduled Meds: . sodium chloride   Intravenous Once  . calcium citrate  500 mg of elemental calcium Oral TID  . digoxin  0.0625 mg Oral Daily  . famotidine  40 mg Oral QHS  . fesoterodine  4 mg Oral Daily  . lactose free nutrition  237 mL Oral TID WC  . metoprolol tartrate  25 mg Oral TID  . misoprostol  100 mcg Oral Q6H  . multivitamin   1 tablet Oral BID  . pantoprazole  40 mg Oral BID  . ramelteon  8 mg Oral QHS  . rOPINIRole  0.5 mg Oral QPC lunch  . rOPINIRole  1 mg Oral QHS  . sodium chloride flush  3 mL Intravenous Q12H  . sucralfate  1 g Oral Q6H   Continuous Infusions: . sodium chloride Stopped (05/11/18 0412)   PRN Meds:.sodium chloride, acetaminophen, oxyCODONE, polyvinyl alcohol, sodium chloride flush  Assessment/Plan: Atrial fibrillation, chronic, permanent Chronic and ischemic GI junction ulcer Abdominal pain S/P RNY gastrojejunostomy Acute on chronic anemia of GI blood loss Anemia of iron deficiency Chronic systolic heart failure, HFrEF  Continue cardiac medications. Awaiting referral to [facility] care facility.   LOS: 6 days    Dixie Dials  MD  05/11/2018, 9:12 AM

## 2018-05-11 NOTE — Progress Notes (Signed)
Physical Therapy Treatment Patient Details Name: Deanna Spencer MRN: [id number] DOB: Aug 11, 1940 Today's Date: 05/11/2018    History of Present Illness Pt is a 78 y/o female admitted secondary to worsening abdominal pain. Pt is s/p EGD which revealed Roux-en-Y gastrojejunostomy with gastrojejunal anastomosis characterized by ulceration. 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 PMH includes AAA, a fib, CHF, osteoporosis, MI, gout, and s/p pacemaker.     PT Comments    Pt progressing towards goals. Continues to be somewhat limited secondary to abdominal pain. Required min to min guard A for mobility within the room. Also initiated supine HEP. Per MD and pt, pt's family concerned about pt returning home at d/c given mobility limitations. Updated recommendations to SNF to increase independence and safety with mobility tasks prior to return home. Will continue to follow acutely to maximize functional mobility independence and safety.      Follow Up Recommendations  SNF     Equipment Recommendations  Rolling walker with 5" wheels    Recommendations for Other Services       Precautions / Restrictions Precautions Precautions: Fall Restrictions Weight Bearing Restrictions: No    Mobility  Bed Mobility Overal bed mobility: Needs Assistance Bed Mobility: Supine to Sit;Sit to Supine     Supine to sit: Supervision Sit to supine: Supervision   General bed mobility comments: Supervision for safety.   Transfers Overall transfer level: Needs assistance Equipment used: None Transfers: Sit to/from Stand Sit to Stand: Min assist         General transfer comment: Min A for steadying assist.   Ambulation/Gait Ambulation/Gait assistance: Min guard Gait Distance (Feet): 20 Feet Assistive device: None Gait Pattern/deviations: Step-through pattern;Decreased stride length Gait velocity: Decreased   General Gait Details: Slow, unsteady gait. Min guard for  steadying assist and pt reaching out for surfaces to hold on to.    Stairs             Wheelchair Mobility    Modified Rankin (Stroke Patients Only)       Balance Overall balance assessment: Needs assistance Sitting-balance support: No upper extremity supported;Feet supported Sitting balance-Leahy Scale: Good     Standing balance support: No upper extremity supported;During functional activity Standing balance-Leahy Scale: Fair Standing balance comment: can stand at sink for hand washing without support and can perform toileting tasks in standing.                             Cognition Arousal/Alertness: Awake/alert Behavior During Therapy: WFL for tasks assessed/performed Overall Cognitive Status: Within Functional Limits for tasks assessed                                        Exercises General Exercises - Lower Extremity Ankle Circles/Pumps: AROM;Both;10 reps;Supine Heel Slides: AROM;Both;Supine;Other reps (comment)(2 reps for demonstration ) Hip ABduction/ADduction: AROM;Both;5 reps;Supine Straight Leg Raises: AROM;Both;5 reps;Supine    General Comments General comments (skin integrity, edema, etc.): Educated/performed generalized supine HEP for LE strengthening. Spoke with MD upon entry, and reports pt and family do not feel comfortable with pt d/c'ing home given deficits.       Pertinent Vitals/Pain Pain Assessment: Faces Faces Pain Scale: Hurts little more Pain Location: stomach Pain Descriptors / Indicators: Cramping;Aching Pain Intervention(s): Limited activity within patient's tolerance;Monitored during session;Repositioned    Home Living  Prior Function            PT Goals (current goals can now be found in the care plan section) Acute Rehab PT Goals Patient Stated Goal: to eat real food PT Goal Formulation: With patient Time For Goal Achievement: 05/22/18 Potential to Achieve Goals:  Good Progress towards PT goals: Progressing toward goals    Frequency    Min 3X/week      PT Plan Discharge plan needs to be updated;Frequency needs to be updated    Co-evaluation              AM-PAC PT "6 Clicks" Mobility   Outcome Measure  Help needed turning from your back to your side while in a flat bed without using bedrails?: None Help needed moving from lying on your back to sitting on the side of a flat bed without using bedrails?: A Little Help needed moving to and from a bed to a chair (including a wheelchair)?: A Little Help needed standing up from a chair using your arms (e.g., wheelchair or bedside chair)?: A Little Help needed to walk in hospital room?: A Little Help needed climbing 3-5 steps with a railing? : A Lot 6 Click Score: 18    End of Session   Activity Tolerance: Patient tolerated treatment well Patient left: in bed;with call bell/phone within reach;with bed alarm set;Other (comment)(transport staff present in room) Nurse Communication: Mobility status PT Visit Diagnosis: Unsteadiness on feet (R26.81);Muscle weakness (generalized) (M62.81);Pain Pain - part of body: (abdomen)     Time: 5366-4403 PT Time Calculation (min) (ACUTE ONLY): 29 min  Charges:  $Therapeutic Exercise: 8-22 mins $Therapeutic Activity: 8-22 mins                     Leighton Ruff, PT, DPT  Acute Rehabilitation Services  Pager: [phone] Office: [phone]    Rudean Hitt 05/11/2018, 4:55 PM

## 2018-05-11 NOTE — Progress Notes (Signed)
OT Cancellation Note  Patient Details Name: Deanna Spencer MRN: [id number] DOB: 1941/01/05   Cancelled Treatment:    Reason Eval/Treat Not Completed: Patient declined, no reason specified(wanting to eat her meals in peace.)   OT to follow up next available treatment day   [name]) [name] OTR/L Acute Rehabilitation Services Pager: [phone] Office: [place] 05/11/2018, 4:29 PM

## 2018-05-12 LAB — COMPREHENSIVE METABOLIC PANEL
ALT: 19 U/L (ref 0–44)
AST: 16 U/L (ref 15–41)
Albumin: 2.4 g/dL — ABNORMAL LOW (ref 3.5–5.0)
Alkaline Phosphatase: 62 U/L (ref 38–126)
Anion gap: 9 (ref 5–15)
BUN: 7 mg/dL — ABNORMAL LOW (ref 8–23)
CO2: 28 mmol/L (ref 22–32)
Calcium: 9.3 mg/dL (ref 8.9–10.3)
Chloride: 103 mmol/L (ref 98–111)
Creatinine, Ser: 0.9 mg/dL (ref 0.44–1.00)
GFR calc Af Amer: >60 mL/min (ref >60)
GFR calc non Af Amer: >60 mL/min (ref >60)
Glucose, Bld: 91 mg/dL (ref 70–99)
Potassium: 3.4 mmol/L — ABNORMAL LOW (ref 3.5–5.1)
Sodium: 140 mmol/L (ref 135–145)
Total Bilirubin: 0.5 mg/dL (ref 0.3–1.2)
Total Protein: 5.2 g/dL — ABNORMAL LOW (ref 6.5–8.1)

## 2018-05-12 LAB — CBC
HCT: 23.5 % — ABNORMAL LOW (ref 36.0–46.0)
Hemoglobin: 7.3 g/dL — ABNORMAL LOW (ref 12.0–15.0)
MCH: 29.9 pg (ref 26.0–34.0)
MCHC: 31.1 g/dL (ref 30.0–36.0)
MCV: 96.3 fL (ref 80.0–100.0)
Platelets: 425 10*3/uL — ABNORMAL HIGH (ref 150–400)
RBC: 2.44 MIL/uL — ABNORMAL LOW (ref 3.87–5.11)
RDW: 16 % — ABNORMAL HIGH (ref 11.5–15.5)
WBC: 5.7 10*3/uL (ref 4.0–10.5)
nRBC: 0 % (ref 0.0–0.2)

## 2018-05-12 LAB — SARS CORONAVIRUS 2 BY RT PCR (HOSPITAL ORDER, PERFORMED IN ~~LOC~~ HOSPITAL LAB): SARS Coronavirus 2: NEGATIVE

## 2018-05-12 MED ORDER — POTASSIUM CHLORIDE CRYS ER 20 MEQ PO TBCR
40.0000 meq | EXTENDED_RELEASE_TABLET | Freq: Once | ORAL | Status: AC
  Administered 2018-05-12: 40 meq via ORAL
  Filled 2018-05-12: qty 2

## 2018-05-12 NOTE — Progress Notes (Signed)
Family Medicine Teaching service update  Patient refusing removal of PICC line, wishing to keep it in place to help with blood draws. Explained to patient that the PICC line is a major nidus for infection and blood draws are not an indication to keep that line in. Also explained that we will be seriously limiting blood draws in the future as we believe the patient is stable for discharge to snf. Will stop lab draws unless there is medically indicated reason in the future.  Guadalupe Dawn MD PGY-2 Family Medicine Resident

## 2018-05-12 NOTE — TOC Progression Note (Addendum)
Transition of Care [facility]) - Progression Note    Patient Details  Name: Deanna Spencer MRN: [id number] Date of Birth: June 27, 1940  Transition of Care [facility]) CM/SW [place], [place] Phone Number: 05/12/2018, 10:37 AM  Clinical Narrative:   CSW noting per chart review that recommendations have changed to SNF. CSW spoke with patient, she is agreeable. Patient requests that CSW speak with her daughter, [name], about details with placement. CSW contacted Carin by phone and left a voicemail. CSW faxed out referral and will follow.  ADDENDUM: CSW spoke with Carin, patient's daughter. Carin agreeable to SNF, unsure of where she would want her mom to go. CSW discussed how none of the facilities are allowing visitors, and that options will be limited; daughter aware. Patient's daughter requested that CSW follow up with her after bed offers are received with which facilities have space available.     Expected Discharge Plan: [place] Barriers to Discharge: Continued Medical Work up  Expected Discharge Plan and Services Expected Discharge Plan: [place] In-house Referral: NA Discharge Planning Services: CM Consult Post Acute Care Choice: NA Living arrangements for the past 2 months: Single Family Home                   DME Agency: [facility] Boys)       HH Arranged: PT [place] Agency: [place] ([facility]) Date [place]: 05/10/18 Time [place]: 1557 Representative spoke with at [place]: [place] ([place]) Interventions    Readmission Risk Interventions No flowsheet data found.

## 2018-05-12 NOTE — Progress Notes (Signed)
PICC line removed per order. Patient still does not understand why it cannot come out until Monday. Explained to patient the risk of infection. Patient willing to have picc line removed, however she is adamant that no one will stick her again for blood. Vaseline gauze and gauze dressing applied. Patient and NT aware patient is on bedrest until 1910. Patient aware to leave dressing on and dry for 24 hours.

## 2018-05-12 NOTE — NC FL2 (Signed)
MEDICAID FL2 LEVEL OF CARE SCREENING TOOL     IDENTIFICATION  Patient Name: Deanna Spencer Birthdate: December 30, 1940 Sex: female Admission Date (Current Location): 05/05/2018  [facility] and [place] Number:  Herbalist and Address:  [facility] [place], [place] [address], [place], [place] [address]      Provider Number: [id number]  Attending Physician Name and Address:  Lind Covert, MD  Relative Name and Phone Number:       Current Level of Care: Hospital Recommended Level of Care: [place] Prior Approval Number:    Date Approved/Denied:   PASRR Number: [id number]  Discharge Plan: SNF    Current Diagnoses: Patient Active Problem List   Diagnosis Date Noted  . Anemia due to chronic blood loss 05/07/2018  . Odynophagia 05/07/2018  . Protein-calorie malnutrition ([place]) 05/07/2018  . Absolute anemia   . Cardiac pacemaker in situ 05/06/2018  . History of anastomosis and bypass hemorrhage 05/06/2018  . Atrial fibrillation with rapid ventricular response ([place])   . Dehydration   . Bilious vomiting with nausea   . Gastrojejunal ulcer   . Acute abdominal pain 05/05/2018  . Rheumatoid arthritis involving both hands ([place]) 09/27/2017  . Seasonal allergies 08/07/2017  . Primary osteoarthritis of right knee 06/02/2017  . Osteoarthritis of right knee 05/30/2017  . Primary osteoarthritis of left hip 02/24/2017  . AAA (abdominal aortic aneurysm) without rupture ([place]) 12/21/2016  . Chronic systolic heart failure ([place]) 12/14/2016  . Atrial fibrillation ([place]) 11/22/2016  . Peripheral neuropathy 11/22/2016  . GERD (gastroesophageal reflux disease) 11/22/2016  . Abdominal pain 11/18/2016  . 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 Parasomnia 11/18/2016    Orientation RESPIRATION BLADDER Height & Weight     Self, Time, Situation, Place  Normal Continent Weight: 123 lb 7.3 oz (56  kg) Height:  4\' 11"  (149.9 cm)  BEHAVIORAL SYMPTOMS/MOOD NEUROLOGICAL BOWEL NUTRITION STATUS      Continent Diet(see DC summary)  AMBULATORY STATUS COMMUNICATION OF NEEDS Skin   Limited Assist Verbally Normal                       Personal Care Assistance Level of Assistance  Bathing, Feeding, Dressing Bathing Assistance: Limited assistance Feeding assistance: Independent Dressing Assistance: Limited assistance     Functional Limitations Info  Sight, Hearing, Speech Sight Info: Impaired(wears glasses) Hearing Info: Impaired Speech Info: Adequate    SPECIAL CARE FACTORS FREQUENCY  PT (By licensed PT), OT (By licensed OT)     PT Frequency: 5x/wk OT Frequency: 5x/wk            Contractures Contractures Info: Not present    Additional Factors Info  Code Status, Allergies Code Status Info: Partial: no CPR Allergies Info: Demerol Meperidine, Nsaids           Current Medications (05/12/2018):  This is the current hospital active medication list Current Facility-Administered Medications  Medication Dose Route Frequency Provider Last Rate Last Dose  . 0.9 %  sodium chloride infusion (Manually program via Guardrails IV Fluids)   Intravenous Once Gatha Mayer, MD      . 0.9 %  sodium chloride infusion   Intravenous PRN McDiarmid, Blane Ohara, MD   Stopped at 05/11/18 [phone]  . acetaminophen (TYLENOL) tablet 650 mg  650 mg Oral Q6H PRN Matilde Haymaker, MD      . calcium citrate (CALCITRATE - dosed in mg elemental calcium)  tablet 500 mg of elemental calcium  500 mg of elemental calcium Oral TID Saverio Danker, PA-C   500 mg of elemental calcium at 05/11/18 0842  . digoxin (LANOXIN) tablet 0.0625 mg  0.0625 mg Oral Daily Matilde Haymaker, MD   0.0625 mg at 05/11/18 0834  . famotidine (PEPCID) tablet 40 mg  40 mg Oral QHS Darrelyn Hillock N, DO   40 mg at 05/11/18 2133  . fesoterodine (TOVIAZ) tablet 4 mg  4 mg Oral Daily Gatha Mayer, MD   4 mg at 05/11/18 9407  . lactose free  nutrition (BOOST PLUS) liquid 237 mL  237 mL Oral TID WC Beard, Samantha N, DO   237 mL at 05/11/18 1721  . metoprolol tartrate (LOPRESSOR) tablet 25 mg  25 mg Oral TID Matilde Haymaker, MD   25 mg at 05/11/18 2133  . misoprostol (CYTOTEC) tablet 100 mcg  100 mcg Oral Q6H Bland, Scott, DO   100 mcg at 05/12/18 0552  . multivitamin (PROSIGHT) tablet 1 tablet  1 tablet Oral BID Saverio Danker, PA-C   1 tablet at 05/11/18 2133  . oxyCODONE (Oxy IR/ROXICODONE) immediate release tablet 5 mg  5 mg Oral Q6H PRN Darrelyn Hillock N, DO   5 mg at 05/12/18 0552  . pantoprazole (PROTONIX) EC tablet 40 mg  40 mg Oral BID Saverio Danker, PA-C   40 mg at 05/11/18 2133  . polyethylene glycol (MIRALAX / GLYCOLAX) packet 17 g  17 g Oral Daily Beard, Samantha N, DO   17 g at 05/11/18 1210  . polyvinyl alcohol (LIQUIFILM TEARS) 1.4 % ophthalmic solution 1 drop  1 drop Both Eyes PRN McDiarmid, Blane Ohara, MD   1 drop at 05/09/18 1706  . potassium chloride SA (K-DUR) CR tablet 40 mEq  40 mEq Oral Once Wilber Oliphant, MD      . ramelteon (ROZEREM) tablet 8 mg  8 mg Oral QHS Matilde Haymaker, MD   8 mg at 05/11/18 2135  . rOPINIRole (REQUIP) tablet 0.5 mg  0.5 mg Oral QPC lunch Gatha Mayer, MD   0.5 mg at 05/11/18 1211  . rOPINIRole (REQUIP) tablet 1 mg  1 mg Oral QHS Gatha Mayer, MD   1 mg at 05/11/18 2135  . senna (SENOKOT) tablet 8.6 mg  1 tablet Oral Daily Beard, Samantha N, DO   8.6 mg at 05/11/18 1201  . sodium chloride flush (NS) 0.9 % injection 10-40 mL  10-40 mL Intracatheter PRN Gatha Mayer, MD      . sodium chloride flush (NS) 0.9 % injection 3 mL  3 mL Intravenous Q12H Gatha Mayer, MD   3 mL at 05/11/18 2137  . sucralfate (CARAFATE) 1 GM/10ML suspension 1 g  1 g Oral Q6H Bland, Scott, DO   1 g at 05/12/18 6808     Discharge Medications: Please see discharge summary for a list of discharge medications.  Relevant Imaging Results:  Relevant Lab Results:   Additional Information SS#:  [id number]  Geralynn Ochs, LCSW

## 2018-05-12 NOTE — Progress Notes (Signed)
Family Medicine Teaching Service Daily Progress Note Intern Pager: [phone]  Patient name: Deanna Spencer record number: [id number] Date of birth: 1940-12-09 Age: 78 y.o. Gender: female  Primary Care Provider: Nuala Alpha, DO Consultants: General Surgery, GI Code Status: Partial, NO CPR but WANTS INTUBATION  Pt Overview and Major Events to Date:  4/25 admission 4/26 1u pRBC, upper endoscopy   Assessment and Plan: Deanna Spencer a 78 y.o.femalepresenting with abdominal pain. PMH is significant forRoux-en-Y surgery with multiple claimed revisions,multiple bleeding ulcers and history,repeated diverticulitis,history of MI,pacemaker,A. fib with no anticoagulation,overactive bladder, HFrEF30-35%,restless leg,hyperlipidemia,osteoarthritis with surgery of the right hip, AAA (stable 3.3cm).  Acute on chronic abdominal pain 2/2 anastomosis gastrojejunal ulcer/?ischemic: Improving.  Improved. Abdominal pain generally improved through hospital course. Dr. Vicenta Aly contacted 4/30 and he will follow up with patient on 5/4 to discuss further management after discharge. Patient is medically stable for discharge and awaiting SNF placement.  -Continue carafate QID, PPI BID, famotdine BID  -Misoprostol 150mcg q 6 hours per surgery recommendations  -Pain: Oxy 5 mg q 6 hours (home med: percoced 5-325 q4h PRN)  Gout flare Patient reports gout flare today. States it began on 5/2 and got worse. Started in her toes, now in hands as well. Joints swollen, tender, and red. Home meds: colchicine 0.6mg  bid PRN gout flares -restart colchicine: 1.2 mg at the first sign of flare, followed in 1 hour with a single dose of 0.6 mg.  -colchicine 0.6 mg once or twice daily until flare resolves starting 5/4  Left shoulder Pain-resolved No longer having shoulder pain.  X-ray was obtained and showed no abnormalities.  No emergent work-up required at this time. -Continue to monitor  symptoms  Nutrition in the setting of gastric bypass surgery: Stable. -Daily MVI, Calcium supplement TID,  -Boost supplement TID w/ meals  -ADAT- currently soft diet   Normocytic anemia: Acute on chronic, stable. Baseline hemoglobin appears to be ~11-12 in 2018, then 10-11 in 2019. Patient arrived at 8.3. She is s/p 1 unit pRBC on 4/26 after hgb of 6.8. Patient is not on anticoagulation. Likely due to combination of iron deficiency, ACD and slow blood loss given ulcer at anastamosis site.  S/p feraheme on 4/30. Hgb 7.3 on 5/2 -no longer obtaining labs as patient is stable and ready for dc to SNF when bed available  -Consider long term IV iron at discharge  -No anticoagulation for A fib   Atrial fibrillation not on chronic anticoagulation: Stable. HR range from 62-78 in last 24 hours.  -Cardiology following, appreciate recommendations  -Poor anticoag candidate w/ history of severe bleeds  -Continue rate control with metoprolol 25mg  TID and Digoxin 0.0625 mg  -Telemetry   CAD  HFrEF 30-35%: Chronic, stable.  Euvolemic.  On metoprolol as above.  Rate control medication and heart failure regimen limited by EF and lower blood pressures. -Metoprolol  -Monitor fluid status daily   Overactive bladder: Chronic, stable.  -Continue Toviaz  Restless leg syndrome: No current symptomatology -Continue home Ropinirole (0.5 mg Q lunch, 1.0mg  QHS)  Constipation:  -Miralax daily  -Senna daily   Insomina  -Ramelteon 8mg  QHS   Disposition Awaiting SNF placement. PT/OT recs for SNF and DME walker (ordered). COVID negative for placement  -CSW consulted, appreciate recommendations  FEN/GI: Maintain full liquid diet, K 3.4 > KDUR 37mEq x1 Prophylaxis:SCD  Disposition: stable for discharge, awaiting SNF bed  Subjective:  Patient today reports increased pain in joints. States started in her toes but now in ankle  and fingers. States this happens every time she has a gout flare and  this pain feels exactly like her other flares. States she normally takes colchicine and it goes away. Reports abdominal pain is improved.   Objective: Temp:  [97.7 F (36.5 C)-98.7 F (37.1 C)] 98.7 F (37.1 C) (05/03 0504) Pulse Rate:  [65-81] 81 (05/03 0504) Resp:  [16] 16 (05/03 0504) BP: (106-126)/(57-73) 126/73 (05/03 0504) SpO2:  [84 %-97 %] 94 % (05/03 0504) Physical Exam: General: awake and alert, laying in bed, NAD Cardiovascular: RRR, no MRG  Respiratory: CTAB, no wheezes, rales, or rhonchi  Abdomen: soft, some tenderness to palpation, non distended, bowel sounds present  Extremities: edema to joints, erythema to joints in PIP joints, tenderness to palpation of joints in toes, ankle, and fingers.   Laboratory: Recent Labs  Lab 05/10/18 1431 05/11/18 0519 05/12/18 0420  WBC 6.7 6.7 5.7  HGB 7.3* 7.1* 7.3*  HCT 23.5* 22.6* 23.5*  PLT 445* 426* 425*   Recent Labs  Lab 05/10/18 0406 05/11/18 0519 05/12/18 0420  NA 140 139 140  K 4.0 3.7 3.4*  CL 103 99 103  CO2 26 29 28   BUN 10 9 7*  CREATININE 0.83 0.91 0.90  CALCIUM 9.7 9.9 9.3  PROT 5.2* 5.3* 5.2*  BILITOT 0.3 0.8 0.5  ALKPHOS 67 69 62  ALT 26 21 19   AST 19 18 16   GLUCOSE 88 87 91    Imaging/Diagnostic Tests: Ct Abdomen Pelvis Wo Contrast  Result Date: 05/05/2018 CLINICAL DATA:  Chronic lower abdominal pain. EXAM: CT ABDOMEN AND PELVIS WITHOUT CONTRAST TECHNIQUE: Multidetector CT imaging of the abdomen and pelvis was performed following the standard protocol without IV contrast. COMPARISON:  CT of the abdomen and pelvis with contrast on 08/08/2017 FINDINGS: Lower chest: No acute abnormality. Hepatobiliary: Stable dilatation of bile ducts after prior cholecystectomy. Unenhanced appearance of the liver is unremarkable. Pancreas: Unremarkable. No pancreatic ductal dilatation or surrounding inflammatory changes. Spleen: Normal in size without focal abnormality. Adrenals/Urinary Tract: No adrenal masses. Stable  cortical scarring of both kidneys. No hydronephrosis or renal calculi identified. The bladder appears unremarkable. Stomach/Bowel: Stable appearance of prior gastric bypass. No evidence of bowel obstruction, ileus or free intraperitoneal air. Stable diverticulosis of the sigmoid colon without evidence by CT of acute diverticulitis. Vascular/Lymphatic: Stable mild aneurysmal dilatation of the infrarenal abdominal aorta measuring 3.3 cm in greatest diameter. No evidence of rupture. No enlarged lymph nodes identified. Reproductive: Status post hysterectomy. No adnexal masses. Other: No abdominal wall hernia or abnormality. No abdominopelvic ascites. Musculoskeletal: Stable appearance of spine after prior posterior lumbar fusion at L3-4. No fractures or bony lesions identified. IMPRESSION: 1. No acute findings in the abdomen or pelvis. 2. Stable dilatation of bile ducts after prior cholecystectomy. 3. Stable diverticulosis of the sigmoid colon without evidence of acute diverticulitis. 4. Stable mild aneurysmal disease of the abdominal aorta measuring 3.3 cm. 5. Stable appearance of prior gastric bypass. Electronically Signed   By: Aletta Edouard M.D.   On: 05/05/2018 15:41   Dg Chest 1 View  Result Date: 05/09/2018 CLINICAL DATA:  78 year old female with shortness of breath EXAM: CHEST  1 VIEW COMPARISON:  05/05/2018 FINDINGS: Cardiomediastinal silhouette unchanged. No evidence of interlobular septal thickening. No pleural effusion.  No pneumothorax. Coarsened interstitial markings throughout. Right upper extremity PICC appears to terminate superior vena cava, though has been withdrawn in the interval. Unchanged left chest wall cardiac pacing device with 2 leads. Bilateral shoulder arthroplasty. IMPRESSION: Chronic changes without  evidence of acute cardiopulmonary disease. Right upper extremity PICC appears to terminate superior vena cava, though has been slightly withdrawn from the comparison. Electronically  Signed   By: Corrie Mckusick D.O.   On: 05/09/2018 19:42   Dg Chest 2 View  Addendum Date: 05/05/2018   ADDENDUM REPORT: 05/05/2018 21:33 ADDENDUM: Right PICC line has been placed with the tip in the SVC. Electronically Signed   By: Rolm Baptise M.D.   On: 05/05/2018 21:33   Result Date: 05/05/2018 CLINICAL DATA:  Abdominal pain EXAM: CHEST - 2 VIEW COMPARISON:  02/24/2017 FINDINGS: Left pacer remains in place, unchanged. Cardiomegaly. Lungs clear. No effusions. No acute bony abnormality. Prior bilateral shoulder replacements. IMPRESSION: Cardiomegaly.  No acute findings. Electronically Signed: By: Rolm Baptise M.D. On: 05/05/2018 21:14   Dg Shoulder Left  Result Date: 05/11/2018 CLINICAL DATA:  Trauma to the left shoulder 1.5 weeks ago with left shoulder pain. EXAM: LEFT SHOULDER - 2+ VIEW COMPARISON:  Chest x-ray May 05, 2018 FINDINGS: There is no evidence of fracture or dislocation. Left shoulder replacement is identified without malalignment. Old posttraumatic changes identified in lateral lower left ribs. Soft tissues are unremarkable. IMPRESSION: No acute fracture or dislocation noted. Left shoulder replacement is identified without malalignment. Electronically Signed   By: Abelardo Diesel M.D.   On: 05/11/2018 15:41   Dg Abd 2 Views  Result Date: 05/05/2018 CLINICAL DATA:  Abdominal pain EXAM: ABDOMEN - 2 VIEW COMPARISON:  CT 05/05/2018 FINDINGS: Nonobstructive bowel gas pattern. No organomegaly or free air. Mild cardiomegaly. Pacer wires noted in the right heart. Lung bases clear. IMPRESSION: No obstruction or free air. Electronically Signed   By: Rolm Baptise M.D.   On: 05/05/2018 21:14   [place] Ekg Site Rite  Result Date: 05/05/2018 If Site Rite image not attached, placement could not be confirmed due to current cardiac rhythm.  Ct Angio Abd/pel W/ And/or W/o  Result Date: 05/05/2018 CLINICAL DATA:  Abdominal pain.  Concern for ischemia. EXAM: CTA ABDOMEN AND PELVIS wITHOUT AND WITH CONTRAST  TECHNIQUE: Multidetector CT imaging of the abdomen and pelvis was performed using the standard protocol during bolus administration of intravenous contrast. Multiplanar reconstructed images and MIPs were obtained and reviewed to evaluate the vascular anatomy. CONTRAST:  146mL OMNIPAQUE IOHEXOL 350 MG/ML SOLN COMPARISON:  05/05/2018 FINDINGS: VASCULAR Aorta: 3 cm infrarenal abdominal aortic aneurysm.  Atherosclerosis. Celiac: Very small in caliber. The splenic artery appears to possibly fill via collaterals. SMA: Patent without evidence of aneurysm, dissection, vasculitis or significant stenosis. Renals: 2 small left renal arteries and 1 right renal artery. No stenosis in the right renal artery. IMA: Patent Inflow: Iliac atherosclerosis. No aneurysm or stenosis. No dissection. Proximal Outflow: Grossly unremarkable. Veins: No obvious venous abnormality within the limitations of this arterial phase study. Review of the MIP images confirms the above findings. NON-VASCULAR Lower chest: Cardiomegaly. Pacer wires in the right heart. Posterior left diaphragmatic hernia containing fat. Lung bases clear. No effusions. Hepatobiliary: Prior cholecystectomy.  No focal hepatic abnormality. Pancreas: No focal abnormality or ductal dilatation. Spleen: No focal abnormality.  Normal size. Adrenals/Urinary Tract: Area of cortical thinning and scarring in the anterior left kidney and upper pole of the left kidney. Lobular contours of the right kidney. No hydronephrosis. Adrenal glands and urinary bladder unremarkable. Stomach/Bowel: Postoperative changes in the stomach and proximal small bowel. No visible complicating feature. Scattered colonic diverticulosis, most pronounced in the descending and sigmoid colon. No active diverticulitis. Small bowel decompressed. No evidence of bowel  obstruction. Lymphatic: No adenopathy Reproductive: Prior hysterectomy.  No adnexal masses. Other: No free fluid or free air. Musculoskeletal: Prior  right hip replacement. Posterior fusion changes in the lumbar spine. No acute bony abnormality. IMPRESSION: VASCULAR Calcified aorta. Infrarenal abdominal aortic aneurysm, 3 cm. Recommend followup by ultrasound in 3 years. This recommendation follows ACR consensus guidelines: White Paper of the ACR Incidental Findings Committee II on Vascular Findings. J Am Coll Radiol 2013; 10:789-794. 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 SMA and IMA appear widely patent. NON-VASCULAR Prior cholecystectomy and hysterectomy. Colonic diverticulosis. No active diverticulitis. No visible bowel changes to suggest ischemia. Electronically Signed   By: Rolm Baptise M.D.   On: 05/05/2018 22:06    Caroline More, DO 05/13/2018, 6:21 AM PGY-2, [facility] Intern pager: [phone], text pages welcome

## 2018-05-12 NOTE — Progress Notes (Addendum)
Family Medicine Teaching Service Daily Progress Note Intern Pager: [phone]  Patient name: Deanna Spencer record number: [id number] Date of birth: April 29, 1940 Age: 78 y.o. Gender: female  Primary Care Provider: Nuala Alpha, DO Consultants: gen surg,GI Code Status: partial, no CPR but wants intubation  Pt Overview and Major Events to Date:  4/25 admission 4/26 1u pRBC, upper endoscopy   Assessment and Plan: Deanna Spencer is a 78 y.o. female presenting with abdominal pain. PMH is significant for Roux-en-Y surgery with multiple claimed revisions, multiple bleeding ulcers and history, repeated diverticulitis, history of MI, pacemaker, A. fib with no anticoagulation, overactive bladder, HFrEF 30-35%,restless leg, hyperlipidemia, osteoarthritis with surgery of the right hip, AAA (stable 3.3cm).  # Acute on chronic abdominal pain 2/2 anastomosis gastrojejunal ulcer/?ischemic: Improving.  Abdominal pain generally improved through hospital course. Dr. Vicenta Aly contacted yesterday and he will follow up with patient on 5/4 to discuss further management after discharge. Patient is medically stable for discharge and awaiting SNF placement.   Continue carafate QID, PPI BID, famotdine BID   Misoprostol 158mcg q 6 hours per surgery recommendations   Pain: Oxy 5 mg q 6 hours.   PT/OT recs for SNF and DME walker (ordered)  Rule out coronavirus pending for SNF placement   #Left shoulder Pain  Patient complained of left shoulder pain yesterday.  X-ray was obtained and showed no abnormalities.  No emergent work-up required at this time.  Continue to monitor symptoms  # Nutrition in the setting of gastric bypass surgery: Stable.  Daily MVI, Calcium supplement TID,   Boost supplement TID w/ meals   ADAT- continue full liquid diet   # Normocytic anemia: Acute on chronic, stable. Baseline hemoglobin appears to be ~11-12 in 2018, then 10-11 in 2019. Patient arrived at 8.3. She is s/p 1  unit pRBC on 4/26 after hgb of 6.8. Patient is not on anticoagulation. Likely due to combination of iron deficiency, ACD and slow blood loss given ulcer at anastamosis site.  S/p feraheme on 4/30. Hgb today is 7.3.  Monitor on CBC  Transfuse if hgb <7   Consider long term IV iron at discharge   No anticoagulation for A fib   # Atrial fibrillation not on chronic anticoagulation: Stable. HR range from 68-81 in last 24 hours.   Cardiology following, appreciate recommendations   Poor anticoag candidate w/ history of severe bleeds   Continue rate control with metoprolol 25mg  TID and Digoxin 0.0625 mg   Telemetry   # CAD  HFrEF 30-35%: Chronic, stable.  Euvolemic.  On metoprolol as above.  Rate control medication and heart failure regimen limited by EF and lower blood pressures.  Metoprolol   Monitor fluid status daily   # Overactive bladder: Chronic, stable.   Continue Toviaz  Restless leg syndrome: No current symptomatology  Continue home Ropinirole (0.5 mg Q lunch, 1.0mg  QHS)  Constipation:   Miralax daily   Senna daily   #Insomina   Ramelteon 8mg  QHS   FEN/GI: Maintain full liquid diet, K 3.4 > KDUR 49mEq x1 Prophylaxis: SCDs  Disposition: Patient medically stable. Awaiting SNF placement   Subjective:  NAEO  Objective: Temp:  [98.7 F (37.1 C)] 98.7 F (37.1 C) (05/01 2225) Pulse Rate:  [67] 67 (05/01 2225) Resp:  [18] 18 (05/01 2225) BP: (100)/(60) 100/60 (05/01 2225) SpO2:  [96 %] 96 % (05/01 2225) Weight:  [56.8 kg] 56.8 kg (05/01 0618)  Physical Exam:  General: NAD, non-toxic, well-appearing, sitting comfortably in bed.  Cardiovascular: irregular rate, no BLEE   Respiratory: CTAB. No IWOB.  Abdomen: + BS. NT, ND, soft to palpation.  Extremities: Warm and well perfused. Moving spontaneously.   Laboratory: Recent Labs  Lab 05/10/18 0406 05/10/18 1431 05/11/18 0519  WBC 6.5 6.7 6.7  HGB 7.2* 7.3* 7.1*  HCT 23.1* 23.5* 22.6*  PLT 462*  445* 426*   Recent Labs  Lab 05/09/18 1737 05/10/18 0406 05/11/18 0519  NA 137 140 139  K 4.0 4.0 3.7  CL 102 103 99  CO2 26 26 29   BUN 11 10 9   CREATININE 0.86 0.83 0.91  CALCIUM 9.8 9.7 9.9  PROT 6.0* 5.2* 5.3*  BILITOT 0.4 0.3 0.8  ALKPHOS 87 67 69  ALT 31 26 21   AST 26 19 18   GLUCOSE 102* 88 87    Imaging/Diagnostic Tests: Dg Shoulder Left Result Date: 05/11/2018 IMPRESSION: No acute fracture or dislocation noted. Left shoulder replacement is identified without malalignment.   Wilber Oliphant, MD 05/12/2018, 1:37 AM PGY-1, [place] Intern pager: [phone], text pages welcome

## 2018-05-12 NOTE — Progress Notes (Signed)
Talked to RN about necessity for PICC line. Patient not currently getting IV medications. RN to contact MD.

## 2018-05-12 NOTE — Progress Notes (Signed)
Family Medicine Teaching Service Attending Note  I interviewed and examined patient Deanna Spencer and reviewed their tests and x-rays.  I discussed with Dr. Tawni Millers and reviewed their note for today.  I agree with their assessment and plan.     Additionally  Feels about the same Advance diet to soft Hgb stable

## 2018-05-13 MED ORDER — COLCHICINE 0.6 MG PO TABS
1.2000 mg | ORAL_TABLET | Freq: Once | ORAL | Status: AC
  Administered 2018-05-13: 1.2 mg via ORAL
  Filled 2018-05-13: qty 2

## 2018-05-13 MED ORDER — COLCHICINE 0.6 MG PO TABS: 0.6000 mg | ORAL_TABLET | Freq: Two times a day (BID) | ORAL | Status: DC | PRN

## 2018-05-13 MED ORDER — COLCHICINE 0.6 MG PO TABS
0.6000 mg | ORAL_TABLET | Freq: Once | ORAL | Status: AC
  Administered 2018-05-13: 0.6 mg via ORAL
  Filled 2018-05-13: qty 1

## 2018-05-14 DIAGNOSIS — I10 Essential (primary) hypertension: Secondary | ICD-10-CM | POA: Diagnosis not present

## 2018-05-14 DIAGNOSIS — R109 Unspecified abdominal pain: Secondary | ICD-10-CM | POA: Diagnosis not present

## 2018-05-14 DIAGNOSIS — K9189 Other postprocedural complications and disorders of digestive system: Secondary | ICD-10-CM | POA: Diagnosis not present

## 2018-05-14 DIAGNOSIS — K5750 Diverticulosis of both small and large intestine without perforation or abscess without bleeding: Secondary | ICD-10-CM | POA: Diagnosis not present

## 2018-05-14 DIAGNOSIS — R5381 Other malaise: Secondary | ICD-10-CM | POA: Diagnosis not present

## 2018-05-14 DIAGNOSIS — I959 Hypotension, unspecified: Secondary | ICD-10-CM | POA: Diagnosis not present

## 2018-05-14 DIAGNOSIS — I4891 Unspecified atrial fibrillation: Secondary | ICD-10-CM | POA: Diagnosis not present

## 2018-05-14 DIAGNOSIS — Z66 Do not resuscitate: Secondary | ICD-10-CM | POA: Diagnosis not present

## 2018-05-14 DIAGNOSIS — K284 Chronic or unspecified gastrojejunal ulcer with hemorrhage: Secondary | ICD-10-CM | POA: Diagnosis not present

## 2018-05-14 DIAGNOSIS — N3281 Overactive bladder: Secondary | ICD-10-CM | POA: Diagnosis not present

## 2018-05-14 DIAGNOSIS — M10012 Idiopathic gout, left shoulder: Secondary | ICD-10-CM | POA: Diagnosis not present

## 2018-05-14 DIAGNOSIS — M2550 Pain in unspecified joint: Secondary | ICD-10-CM | POA: Diagnosis not present

## 2018-05-14 DIAGNOSIS — Z7401 Bed confinement status: Secondary | ICD-10-CM | POA: Diagnosis not present

## 2018-05-14 DIAGNOSIS — D500 Iron deficiency anemia secondary to blood loss (chronic): Secondary | ICD-10-CM | POA: Diagnosis not present

## 2018-05-14 DIAGNOSIS — M6281 Muscle weakness (generalized): Secondary | ICD-10-CM | POA: Diagnosis not present

## 2018-05-14 DIAGNOSIS — M1000 Idiopathic gout, unspecified site: Secondary | ICD-10-CM | POA: Diagnosis not present

## 2018-05-14 DIAGNOSIS — K5900 Constipation, unspecified: Secondary | ICD-10-CM | POA: Diagnosis not present

## 2018-05-14 DIAGNOSIS — I714 Abdominal aortic aneurysm, without rupture: Secondary | ICD-10-CM | POA: Diagnosis not present

## 2018-05-14 DIAGNOSIS — M069 Rheumatoid arthritis, unspecified: Secondary | ICD-10-CM | POA: Diagnosis not present

## 2018-05-14 DIAGNOSIS — K289 Gastrojejunal ulcer, unspecified as acute or chronic, without hemorrhage or perforation: Secondary | ICD-10-CM | POA: Diagnosis not present

## 2018-05-14 DIAGNOSIS — I2510 Atherosclerotic heart disease of native coronary artery without angina pectoris: Secondary | ICD-10-CM | POA: Diagnosis not present

## 2018-05-14 DIAGNOSIS — R197 Diarrhea, unspecified: Secondary | ICD-10-CM | POA: Diagnosis not present

## 2018-05-14 DIAGNOSIS — Z95810 Presence of automatic (implantable) cardiac defibrillator: Secondary | ICD-10-CM | POA: Diagnosis not present

## 2018-05-14 DIAGNOSIS — G2581 Restless legs syndrome: Secondary | ICD-10-CM | POA: Diagnosis not present

## 2018-05-14 DIAGNOSIS — G629 Polyneuropathy, unspecified: Secondary | ICD-10-CM | POA: Diagnosis not present

## 2018-05-14 DIAGNOSIS — R0902 Hypoxemia: Secondary | ICD-10-CM | POA: Diagnosis not present

## 2018-05-14 DIAGNOSIS — E46 Unspecified protein-calorie malnutrition: Secondary | ICD-10-CM | POA: Diagnosis not present

## 2018-05-14 DIAGNOSIS — R031 Nonspecific low blood-pressure reading: Secondary | ICD-10-CM | POA: Diagnosis not present

## 2018-05-14 DIAGNOSIS — I480 Paroxysmal atrial fibrillation: Secondary | ICD-10-CM | POA: Diagnosis not present

## 2018-05-14 DIAGNOSIS — Z950 Presence of cardiac pacemaker: Secondary | ICD-10-CM | POA: Diagnosis not present

## 2018-05-14 DIAGNOSIS — M109 Gout, unspecified: Secondary | ICD-10-CM | POA: Diagnosis not present

## 2018-05-14 DIAGNOSIS — R1310 Dysphagia, unspecified: Secondary | ICD-10-CM | POA: Diagnosis not present

## 2018-05-14 DIAGNOSIS — K219 Gastro-esophageal reflux disease without esophagitis: Secondary | ICD-10-CM | POA: Diagnosis not present

## 2018-05-14 DIAGNOSIS — I5022 Chronic systolic (congestive) heart failure: Secondary | ICD-10-CM | POA: Diagnosis not present

## 2018-05-14 MED ORDER — DIGOXIN 62.5 MCG PO TABS: 0.0625 mg | ORAL_TABLET | Freq: Every day | ORAL | 0 refills | Status: DC

## 2018-05-14 MED ORDER — FAMOTIDINE 40 MG PO TABS: 40.0000 mg | ORAL_TABLET | Freq: Every day | ORAL | 0 refills | Status: DC

## 2018-05-14 MED ORDER — CALCIUM CITRATE 950 (200 CA) MG PO TABS: 500.0000 mg | ORAL_TABLET | Freq: Three times a day (TID) | ORAL | 0 refills | Status: DC

## 2018-05-14 MED ORDER — SENNA 8.6 MG PO TABS: 1.0000 | ORAL_TABLET | Freq: Every day | ORAL | 0 refills | Status: DC

## 2018-05-14 MED ORDER — METOPROLOL TARTRATE 25 MG PO TABS: 25.0000 mg | ORAL_TABLET | Freq: Three times a day (TID) | ORAL | Status: DC

## 2018-05-14 MED ORDER — POLYETHYLENE GLYCOL 3350 17 G PO PACK: 17.0000 g | PACK | Freq: Every day | ORAL | 0 refills | Status: DC

## 2018-05-14 MED ORDER — SUCRALFATE 1 GM/10ML PO SUSP: 1.0000 g | Freq: Four times a day (QID) | ORAL | 0 refills | Status: DC

## 2018-05-14 MED ORDER — OXYCODONE HCL 5 MG PO TABS: 5.0000 mg | ORAL_TABLET | Freq: Four times a day (QID) | ORAL | 0 refills | Status: DC | PRN

## 2018-05-14 MED ORDER — PROSIGHT PO TABS: 1.0000 | ORAL_TABLET | Freq: Two times a day (BID) | ORAL | 0 refills | Status: DC

## 2018-05-14 MED ORDER — BOOST PLUS PO LIQD: 237.0000 mL | Freq: Three times a day (TID) | ORAL | 0 refills | Status: DC

## 2018-05-14 MED ORDER — MISOPROSTOL 100 MCG PO TABS: 100.0000 ug | ORAL_TABLET | Freq: Four times a day (QID) | ORAL | 0 refills | Status: DC

## 2018-05-14 NOTE — Progress Notes (Signed)
Patient was discharged to nursing home [facility]) by MD order; discharged instructions review and sent to facility via [facility] with care notes and prescription; IV DIC; skin intact; facility was called and report was given to the nurse who is going to receive the patient; patient will be transported to facility via [place].

## 2018-05-14 NOTE — Discharge Instructions (Signed)
Patient going to SNF with acute on chronic abdominal pain.  Would recommend continuing her Carafate slurry with meals and nightly, Pepcid twice daily, PPI twice daily, and misoprostol every 6.  She has additionally been tolerating a soft diet, would continue this and advance as tolerated.   She had a gout flare of her PIP and wrist joints on the left 2 days prior to discharge.  Should receive colchicine daily for 2 additional days at most.  Please ensure resolved.

## 2018-05-14 NOTE — Progress Notes (Signed)
Physical Therapy Treatment Patient Details Name: Deanna Spencer MRN: [id number] DOB: [birth year] Today's Date: 05/14/2018    History of Present Illness Pt is a 78 y/o female admitted secondary to worsening abdominal pain. Pt is s/p EGD which revealed Roux-en-Y gastrojejunostomy with gastrojejunal anastomosis characterized by ulceration. 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 PMH includes AAA, a fib, CHF, osteoporosis, MI, gout, and s/p pacemaker.     PT Comments    Pt only agreeable to performing ambulation in the room. Worked on dynamic balance activities (reaching out of base of support) to pack belongings. Pt eager to d/c to SNF. Current recommendations appropriate. Will continue to follow acutely to maximize functional mobility independence and safety.     Follow Up Recommendations  SNF     Equipment Recommendations  Rolling walker with 5" wheels    Recommendations for Other Services       Precautions / Restrictions Precautions Precautions: Fall Restrictions Weight Bearing Restrictions: No    Mobility  Bed Mobility Overal bed mobility: Needs Assistance Bed Mobility: Sit to Supine       Sit to supine: Supervision   General bed mobility comments: Supervision to return to supine. Pt sitting at EOB upon entry.   Transfers Overall transfer level: Needs assistance Equipment used: None Transfers: Sit to/from Stand Sit to Stand: Min guard         General transfer comment: Min guard for steadying A.   Ambulation/Gait Ambulation/Gait assistance: Min guard Gait Distance (Feet): 15 Feet Assistive device: None Gait Pattern/deviations: Step-through pattern;Decreased stride length Gait velocity: Decreased   General Gait Details: Pt only agreeable to walking around room to pack belongings as she was leaving today. Pt requiring min guard A for balance. Increased unsteadiness noted when performing dynamic balance activities (reaching  outside of BOS).    Stairs             Wheelchair Mobility    Modified Rankin (Stroke Patients Only)       Balance Overall balance assessment: Needs assistance Sitting-balance support: No upper extremity supported;Feet supported Sitting balance-Leahy Scale: Good     Standing balance support: No upper extremity supported;During functional activity Standing balance-Leahy Scale: Fair Standing balance comment: Notable unsteadiness when reaching outside of BOS to collect belongings and pack them up. Pt leaning against surfaces when performing dynamic balance activities.                             Cognition Arousal/Alertness: Awake/alert Behavior During Therapy: WFL for tasks assessed/performed Overall Cognitive Status: Within Functional Limits for tasks assessed                                        Exercises      General Comments        Pertinent Vitals/Pain Pain Assessment: Faces Faces Pain Scale: Hurts little more Pain Location: stomach Pain Descriptors / Indicators: Cramping;Aching Pain Intervention(s): Limited activity within patient's tolerance;Monitored during session;Repositioned    Home Living                      Prior Function            PT Goals (current goals can now be found in the care plan section) Acute Rehab PT Goals Patient Stated Goal: to go to the rehab  center PT Goal Formulation: With patient Time For Goal Achievement: 05/22/18 Potential to Achieve Goals: Good Progress towards PT goals: Progressing toward goals    Frequency    Min 2X/week      PT Plan Current plan remains appropriate    Co-evaluation              AM-PAC PT "6 Clicks" Mobility   Outcome Measure  Help needed turning from your back to your side while in a flat bed without using bedrails?: None Help needed moving from lying on your back to sitting on the side of a flat bed without using bedrails?: A Little Help  needed moving to and from a bed to a chair (including a wheelchair)?: A Little Help needed standing up from a chair using your arms (e.g., wheelchair or bedside chair)?: A Little Help needed to walk in hospital room?: A Little Help needed climbing 3-5 steps with a railing? : A Lot 6 Click Score: 18    End of Session   Activity Tolerance: Patient tolerated treatment well Patient left: in bed;with call bell/phone within reach;with nursing/sitter in room Nurse Communication: Mobility status PT Visit Diagnosis: Unsteadiness on feet (R26.81);Muscle weakness (generalized) (M62.81);Pain Pain - part of body: (abdomen)     Time: 1401-1420 PT Time Calculation (min) (ACUTE ONLY): 19 min  Charges:  $Therapeutic Activity: 8-22 mins                     Deanna Spencer, PT, DPT  Acute Rehabilitation Services  Pager: [phone] Office: [phone]    Deanna Spencer 05/14/2018, 2:46 PM

## 2018-05-14 NOTE — TOC Progression Note (Addendum)
Transition of Care [facility]) - Progression Note    Patient Details  Name: Deanna Spencer MRN: [id number] Date of Birth: [birth year]  Transition of Care [facility]) CM/SW [place], LCSW Phone Number: 05/14/2018, 10:10 AM  Clinical Narrative:    12pm-[facility] able to accept patient today.   10am-CSW spoke with patient's daughter regarding SNF choice. She has selected [facility]. Their liaison will call CSW back to confirm that they have a bed available today.    Expected Discharge Plan: [place] Barriers to Discharge: Continued Medical Work up  Expected Discharge Plan and Services Expected Discharge Plan: [place] In-house Referral: NA Discharge Planning Services: CM Consult Post Acute Care Choice: NA Living arrangements for the past 2 months: Single Family Home                   DME Agency: Kirt Boys)       HH Arranged: PT [place] Agency: [place] ([facility]) Date [place]: 05/10/18 Time [place]: 1557 Representative spoke with at [place]: [place] ([place]) Interventions    Readmission Risk Interventions No flowsheet data found.

## 2018-05-14 NOTE — Care Management Important Message (Signed)
Important Message  Patient Details  Name: Deanna Spencer MRN: [id number] Date of Birth: [birth year]   Medicare Important Message Given:  Yes    Ezekiah Massie Montine Circle 05/14/2018, 3:54 PM

## 2018-05-14 NOTE — Plan of Care (Signed)
  Problem: Education: Goal: Knowledge of General Education information will improve Description Including pain rating scale, medication(s)/side effects and non-pharmacologic comfort measures Outcome: Progressing Note:  POC reviewed with pt.   

## 2018-05-14 NOTE — Progress Notes (Signed)
Family Medicine Teaching Service Daily Progress Note Intern Pager: [phone]  Patient name: Deanna Spencer record number: [id number] Date of birth: January 20, 1940 Age: 78 y.o. Gender: female  Primary Care Provider: Nuala Alpha, DO Consultants: General Surgery, GI Code Status: Partial, NO CPR but WANTS INTUBATION  Pt Overview and Major Events to Date:  4/25 admission 4/26 1u pRBC, upper endoscopy   Assessment and Plan: Deanna Spencer a 78 y.o.femalepresenting with abdominal pain. PMH is significant forRoux-en-Y surgery with multiple claimed revisions,multiple bleeding ulcers and history,repeated diverticulitis,history of MI,pacemaker,A. fib with no anticoagulation,overactive bladder, HFrEF30-35%,restless leg,hyperlipidemia,osteoarthritis with surgery of the right hip, AAA (stable 3.3cm).  Acute on chronic abdominal pain 2/2 anastomosis gastrojejunal ulcer/?ischemic: Improving.  Currently well controlled, tolerating soft diet.  Awaiting f/u from Dr. Vicenta Aly and SNF placement.   -Carafate 4 times daily -Protonix 40 mg twice daily -Pepcid 40 mg nightly -Misoprostol 178mcg q 6 hours per surgery recommendations  -Pain: Oxy 5 mg q 6 hours (home med: percoced 5-325 q4h PRN) - Ensure follow-up with tertiary bariatric surgeon  Acute gout flare: Improving. Wrist and interdigital joints less swollen, erythematous, and tender.  Started colchicine yesterday.  - Colchicine 0.6 mg once or twice daily until flare resolves (day 2)  Nutrition in the setting of gastric bypass surgery: Stable. -Daily MVI, Calcium supplement TID -Boost supplement TID w/ meals  -ADAT- currently soft diet   Normocytic anemia: Acute on chronic, stable. Hemoglobin 7.3 on 5/2.  No longer obtaining labs as patient is medically ready for discharge and patient decline.  -no longer obtaining labs as patient is stable and ready for dc to SNF when bed available  -Consider long term IV iron at discharge   -No anticoagulation for A fib   Atrial fibrillation not on chronic anticoagulation: Stable. HR range from 70-80 in last 24 hours.  -Cardiology following, appreciate recommendations  -Poor anticoag candidate w/ history of severe bleeds  -Continue rate control with metoprolol 25mg  TID and Digoxin 0.0625 mg  -Telemetry   CAD  HFrEF 30-35%: Chronic, stable.  Euvolemic.  On metoprolol as above.  Rate control medication and heart failure regimen limited by EF and lower blood pressures. -Metoprolol  -Monitor fluid status daily   Overactive bladder: Chronic, stable.  -Continue Toviaz  Restless leg syndrome: No current symptomatology -Continue home Ropinirole (0.5 mg Q lunch, 1.0mg  QHS)  Constipation: Chronic. -Miralax daily  -Senna daily   Insomnia: Chronic, stable -Ramelteon 8mg  QHS    FEN/GI: Soft diet Prophylaxis:SCD  Disposition: Medically stable for discharge, awaiting SNF placement.   Subjective:  Doing well this morning.  Says her stomach is a 4-5/10 on a pain scale, which she said is very good.  Has been tolerating a soft diet without concern.  Additionally, says her wrist and fingers are feeling better from a gout flare yesterday, less swollen/tender.   Objective: Temp:  [98.3 F (36.8 C)-99.2 F (37.3 C)] 98.3 F (36.8 C) (05/04 0631) Pulse Rate:  [71-76] 75 (05/04 0632) Resp:  [16-18] 18 (05/04 0631) BP: (95-110)/(58-64) 110/64 (05/04 6767) SpO2:  [93 %-94 %] 93 % (05/04 0631) Physical Exam: General: Alert, NAD HEENT: NCAT, MMM  Cardiac: Irregularly irregular Lungs: Clear bilaterally, no increased WOB  Abdomen: soft, slightly tender to palpation in the left lower quadrant, non-distended, normoactive BS Msk: Moves all extremities spontaneously  Ext: Warm, dry, 2+ distal pulses, no edema    Laboratory: Recent Labs  Lab 05/10/18 1431 05/11/18 0519 05/12/18 0420  WBC 6.7 6.7 5.7  HGB  7.3* 7.1* 7.3*  HCT 23.5* 22.6* 23.5*  PLT 445* 426* 425*    Recent Labs  Lab 05/10/18 0406 05/11/18 0519 05/12/18 0420  NA 140 139 140  K 4.0 3.7 3.4*  CL 103 99 103  CO2 26 29 28   BUN 10 9 7*  CREATININE 0.83 0.91 0.90  CALCIUM 9.7 9.9 9.3  PROT 5.2* 5.3* 5.2*  BILITOT 0.3 0.8 0.5  ALKPHOS 67 69 62  ALT 26 21 19   AST 19 18 16   GLUCOSE 88 87 91    Imaging/Diagnostic Tests: Ct Abdomen Pelvis Wo Contrast  Result Date: 05/05/2018 CLINICAL DATA:  Chronic lower abdominal pain. EXAM: CT ABDOMEN AND PELVIS WITHOUT CONTRAST TECHNIQUE: Multidetector CT imaging of the abdomen and pelvis was performed following the standard protocol without IV contrast. COMPARISON:  CT of the abdomen and pelvis with contrast on 08/08/2017 FINDINGS: Lower chest: No acute abnormality. Hepatobiliary: Stable dilatation of bile ducts after prior cholecystectomy. Unenhanced appearance of the liver is unremarkable. Pancreas: Unremarkable. No pancreatic ductal dilatation or surrounding inflammatory changes. Spleen: Normal in size without focal abnormality. Adrenals/Urinary Tract: No adrenal masses. Stable cortical scarring of both kidneys. No hydronephrosis or renal calculi identified. The bladder appears unremarkable. Stomach/Bowel: Stable appearance of prior gastric bypass. No evidence of bowel obstruction, ileus or free intraperitoneal air. Stable diverticulosis of the sigmoid colon without evidence by CT of acute diverticulitis. Vascular/Lymphatic: Stable mild aneurysmal dilatation of the infrarenal abdominal aorta measuring 3.3 cm in greatest diameter. No evidence of rupture. No enlarged lymph nodes identified. Reproductive: Status post hysterectomy. No adnexal masses. Other: No abdominal wall hernia or abnormality. No abdominopelvic ascites. Musculoskeletal: Stable appearance of spine after prior posterior lumbar fusion at L3-4. No fractures or bony lesions identified. IMPRESSION: 1. No acute findings in the abdomen or pelvis. 2. Stable dilatation of bile ducts after prior  cholecystectomy. 3. Stable diverticulosis of the sigmoid colon without evidence of acute diverticulitis. 4. Stable mild aneurysmal disease of the abdominal aorta measuring 3.3 cm. 5. Stable appearance of prior gastric bypass. Electronically Signed   By: Aletta Edouard M.D.   On: 05/05/2018 15:41   Dg Chest 1 View  Result Date: 05/09/2018 CLINICAL DATA:  78 year old female with shortness of breath EXAM: CHEST  1 VIEW COMPARISON:  05/05/2018 FINDINGS: Cardiomediastinal silhouette unchanged. No evidence of interlobular septal thickening. No pleural effusion.  No pneumothorax. Coarsened interstitial markings throughout. Right upper extremity PICC appears to terminate superior vena cava, though has been withdrawn in the interval. Unchanged left chest wall cardiac pacing device with 2 leads. Bilateral shoulder arthroplasty. IMPRESSION: Chronic changes without evidence of acute cardiopulmonary disease. Right upper extremity PICC appears to terminate superior vena cava, though has been slightly withdrawn from the comparison. Electronically Signed   By: Corrie Mckusick D.O.   On: 05/09/2018 19:42   Dg Chest 2 View  Addendum Date: 05/05/2018   ADDENDUM REPORT: 05/05/2018 21:33 ADDENDUM: Right PICC line has been placed with the tip in the SVC. Electronically Signed   By: Rolm Baptise M.D.   On: 05/05/2018 21:33   Result Date: 05/05/2018 CLINICAL DATA:  Abdominal pain EXAM: CHEST - 2 VIEW COMPARISON:  02/24/2017 FINDINGS: Left pacer remains in place, unchanged. Cardiomegaly. Lungs clear. No effusions. No acute bony abnormality. Prior bilateral shoulder replacements. IMPRESSION: Cardiomegaly.  No acute findings. Electronically Signed: By: Rolm Baptise M.D. On: 05/05/2018 21:14   Dg Shoulder Left  Result Date: 05/11/2018 CLINICAL DATA:  Trauma to the left shoulder 1.5 weeks ago with  left shoulder pain. EXAM: LEFT SHOULDER - 2+ VIEW COMPARISON:  Chest x-ray May 05, 2018 FINDINGS: There is no evidence of fracture or  dislocation. Left shoulder replacement is identified without malalignment. Old posttraumatic changes identified in lateral lower left ribs. Soft tissues are unremarkable. IMPRESSION: No acute fracture or dislocation noted. Left shoulder replacement is identified without malalignment. Electronically Signed   By: Abelardo Diesel M.D.   On: 05/11/2018 15:41   Dg Abd 2 Views  Result Date: 05/05/2018 CLINICAL DATA:  Abdominal pain EXAM: ABDOMEN - 2 VIEW COMPARISON:  CT 05/05/2018 FINDINGS: Nonobstructive bowel gas pattern. No organomegaly or free air. Mild cardiomegaly. Pacer wires noted in the right heart. Lung bases clear. IMPRESSION: No obstruction or free air. Electronically Signed   By: Rolm Baptise M.D.   On: 05/05/2018 21:14   [place] Ekg Site Rite  Result Date: 05/05/2018 If Site Rite image not attached, placement could not be confirmed due to current cardiac rhythm.  Ct Angio Abd/pel W/ And/or W/o  Result Date: 05/05/2018 CLINICAL DATA:  Abdominal pain.  Concern for ischemia. EXAM: CTA ABDOMEN AND PELVIS wITHOUT AND WITH CONTRAST TECHNIQUE: Multidetector CT imaging of the abdomen and pelvis was performed using the standard protocol during bolus administration of intravenous contrast. Multiplanar reconstructed images and MIPs were obtained and reviewed to evaluate the vascular anatomy. CONTRAST:  131mL OMNIPAQUE IOHEXOL 350 MG/ML SOLN COMPARISON:  05/05/2018 FINDINGS: VASCULAR Aorta: 3 cm infrarenal abdominal aortic aneurysm.  Atherosclerosis. Celiac: Very small in caliber. The splenic artery appears to possibly fill via collaterals. SMA: Patent without evidence of aneurysm, dissection, vasculitis or significant stenosis. Renals: 2 small left renal arteries and 1 right renal artery. No stenosis in the right renal artery. IMA: Patent Inflow: Iliac atherosclerosis. No aneurysm or stenosis. No dissection. Proximal Outflow: Grossly unremarkable. Veins: No obvious venous abnormality within the limitations of this  arterial phase study. Review of the MIP images confirms the above findings. NON-VASCULAR Lower chest: Cardiomegaly. Pacer wires in the right heart. Posterior left diaphragmatic hernia containing fat. Lung bases clear. No effusions. Hepatobiliary: Prior cholecystectomy.  No focal hepatic abnormality. Pancreas: No focal abnormality or ductal dilatation. Spleen: No focal abnormality.  Normal size. Adrenals/Urinary Tract: Area of cortical thinning and scarring in the anterior left kidney and upper pole of the left kidney. Lobular contours of the right kidney. No hydronephrosis. Adrenal glands and urinary bladder unremarkable. Stomach/Bowel: Postoperative changes in the stomach and proximal small bowel. No visible complicating feature. Scattered colonic diverticulosis, most pronounced in the descending and sigmoid colon. No active diverticulitis. Small bowel decompressed. No evidence of bowel obstruction. Lymphatic: No adenopathy Reproductive: Prior hysterectomy.  No adnexal masses. Other: No free fluid or free air. Musculoskeletal: Prior right hip replacement. Posterior fusion changes in the lumbar spine. No acute bony abnormality. IMPRESSION: VASCULAR Calcified aorta. Infrarenal abdominal aortic aneurysm, 3 cm. Recommend followup by ultrasound in 3 years. This recommendation follows ACR consensus guidelines: White Paper of the ACR Incidental Findings Committee II on Vascular Findings. J Am Coll Radiol 2013; 10:789-794. 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 SMA and IMA appear widely patent. NON-VASCULAR Prior cholecystectomy and hysterectomy. Colonic diverticulosis. No active diverticulitis. No visible bowel changes to suggest ischemia. Electronically Signed   By: Rolm Baptise M.D.   On: 05/05/2018 22:06    Patriciaann Clan, DO 05/14/2018, 7:12 AM PGY-1, [place] Intern pager: [phone], text pages welcome

## 2018-05-14 NOTE — Progress Notes (Signed)
Nutrition Follow-up  RD working remotely.  DOCUMENTATION CODES:   Not applicable  INTERVENTION:   -Continue MVI daily -Continue Boost Plus TID, each supplement provides 360 kcals and 14 grams protein  NUTRITION DIAGNOSIS:   Increased nutrient needs related to chronic illness as evidenced by estimated needs.  Ongoing  GOAL:   Patient will meet greater than or equal to 90% of their needs  Progressing  MONITOR:   PO intake, Supplement acceptance, Labs, Weight trends, Skin, I & O's  REASON FOR ASSESSMENT:   Consult Assessment of nutrition requirement/status  ASSESSMENT:   78 y.o. Female presenting with abdominal pain. PMH is significant for Roux-en-Y surgery with multiple claimed revisions, multiple bleeding ulcers and history and repeated diverticulitis.  5/2- PICC line removed  Reviewed I/O's: +120 ml x 24 hours and +3.3 L since admission  Per GI notes, pt with recurrent anastomotic gastrojejunal ulcer secondary to ?ischemia. Recommending possible considerations of reversal of her bypass or total gastrectomy, etc. [facility] has accepted referral as an outpatient.  Attempted to speak with pt on the phone, however, did not receive an answer when called. Pt on a soft diet, with variable intake. Per meal completion records, pt consuming 25-100% of meals, but averaging around 50% meal completion. Per chart review, she did not tolerate Ensure Enlive or Boost Breeze supplements well. Boost Plus was added on 05/08/18; pt accepted this morning's dose. Per MAR, pt refused 2 of the last 6 doses of Boost Plus.  Per MD notes, pt is medically stable for discharge and awaiting SNF placement, where she will transfer once a bed is available.   Labs reviewed: K: 3.4.   Diet Order:   Diet Order            DIET SOFT Room service appropriate? Yes; Fluid consistency: Thin  Diet effective now              EDUCATION NEEDS:   Not appropriate for education at this time  Skin:  Skin  Assessment: Reviewed RN Assessment  Last BM:  05/12/18  Height:   Ht Readings from Last 1 Encounters:  05/06/18 4\' 11"  (1.499 m)    Weight:   Wt Readings from Last 1 Encounters:  05/12/18 56 kg    Ideal Body Weight:  44.5 kg  BMI:  Body mass index is 24.94 kg/m.  Estimated Nutritional Needs:   Kcal:  1500-1700  Protein:  75-90 gm  Fluid:  1.5-1.7 L    Ragen Laver A. Jimmye Norman, RD, LDN, [place] Registered Dietitian II Certified Diabetes Care and Education Specialist Pager: [phone] After hours Pager: [phone]

## 2018-05-14 NOTE — TOC Transition Note (Signed)
Transition of Care [facility]) - CM/SW Discharge Note   Patient Details  Name: Deanna Spencer MRN: [id number] Date of Birth: Sep 13, 1940  Transition of Care [facility]) CM/SW Contact:  Benard Halsted, LCSW Phone Number: 05/14/2018, 1:24 PM   Clinical Narrative:    Patient will DC to: [place]  Anticipated DC date: 05/14/18 Family notified: Daughter, Chartered loss adjuster by: Corey Harold 2:30pm   Per MD patient ready for DC to [facility]. RN, patient, patient's family, and facility notified of DC. Discharge Summary and FL2 sent to facility. RN to call report prior to discharge [phone] Room 130b). DC packet on chart. Ambulance transport requested for patient.   CSW will sign off for now as social work intervention is no longer needed. Please consult [place] again if new needs arise.  Cedric Fishman, LCSW Clinical Social Worker [phone]    Final next level of care: Skilled Nursing Facility Barriers to Discharge: No Barriers Identified   Patient Goals and CMS Choice Patient states their goals for this hospitalization and ongoing recovery are:: to get better CMS Medicare.gov Compare Post Acute Care list provided to:: Patient Choice offered to / list presented to : Patient  Discharge Placement   Existing PASRR number confirmed : 05/14/18          Patient chooses bed at: [facility] Patient to be transferred to facility by: [place] Name of family member notified: [name], daughter Patient and family notified of of transfer: 05/14/18  Discharge Plan and Services In-house Referral: NA Discharge Planning Services: CM Consult Post Acute Care Choice: NA          DME Arranged: N/A DME Agency: NA       HH Arranged: NA HH Agency: NA Date HH Agency Contacted: 05/10/18 Time [place]: 0459 Representative spoke with at [place]: [place] ([place]) Interventions     Readmission Risk Interventions Readmission Risk Prevention Plan 05/14/2018   Transportation Screening Complete  PCP or Specialist Appt within 5-7 Days Complete  Home Care Screening Complete  Medication Review (RN CM) Complete

## 2018-05-15 DIAGNOSIS — I10 Essential (primary) hypertension: Secondary | ICD-10-CM | POA: Diagnosis not present

## 2018-05-15 DIAGNOSIS — R031 Nonspecific low blood-pressure reading: Secondary | ICD-10-CM | POA: Diagnosis not present

## 2018-05-15 DIAGNOSIS — D500 Iron deficiency anemia secondary to blood loss (chronic): Secondary | ICD-10-CM | POA: Diagnosis not present

## 2018-05-15 DIAGNOSIS — R5381 Other malaise: Secondary | ICD-10-CM | POA: Diagnosis not present

## 2018-05-15 DIAGNOSIS — I4891 Unspecified atrial fibrillation: Secondary | ICD-10-CM | POA: Diagnosis not present

## 2018-05-16 DIAGNOSIS — R031 Nonspecific low blood-pressure reading: Secondary | ICD-10-CM | POA: Diagnosis not present

## 2018-05-16 DIAGNOSIS — R5381 Other malaise: Secondary | ICD-10-CM | POA: Diagnosis not present

## 2018-05-16 DIAGNOSIS — I4891 Unspecified atrial fibrillation: Secondary | ICD-10-CM | POA: Diagnosis not present

## 2018-05-16 DIAGNOSIS — Z66 Do not resuscitate: Secondary | ICD-10-CM | POA: Diagnosis not present

## 2018-05-16 DIAGNOSIS — I10 Essential (primary) hypertension: Secondary | ICD-10-CM | POA: Diagnosis not present

## 2018-05-17 ENCOUNTER — Other Ambulatory Visit: Payer: Self-pay | Admitting: *Deleted

## 2018-05-17 DIAGNOSIS — I480 Paroxysmal atrial fibrillation: Secondary | ICD-10-CM | POA: Diagnosis not present

## 2018-05-17 DIAGNOSIS — R109 Unspecified abdominal pain: Secondary | ICD-10-CM | POA: Diagnosis not present

## 2018-05-17 DIAGNOSIS — R197 Diarrhea, unspecified: Secondary | ICD-10-CM | POA: Diagnosis not present

## 2018-05-17 DIAGNOSIS — G2581 Restless legs syndrome: Secondary | ICD-10-CM | POA: Diagnosis not present

## 2018-05-17 NOTE — Patient Outreach (Signed)
[place] [facility]) Care Management  05/17/2018  Deanna Spencer February 17, 1940 [id number]   Member assessed for potential [place] Management services as a benefit of her NextGen Medicare insurance.   Member discussed in today's telephonic IDT meeting with [facility] UM RN and [facility] facility staff.  Facility reports member is progressing well with therapy. However, they have been monitoring her BP and heart rate.   Facility reports member lives with daughter and member will likely transition back home with daughter upon SNF discharge.   Member has a history of AFIB, CAD, HF, anemia, recent hospitalization for abdominal pain, gastric bypass surgery.  Will plan to make [place] referral closer to SNF discharge.   Will continue to collaborate with [facility] UM RN and facility staff on member.  [name], MSN-Ed, RN,BSN [place] Acute Care Coordinator [phone]

## 2018-05-18 DIAGNOSIS — R031 Nonspecific low blood-pressure reading: Secondary | ICD-10-CM | POA: Diagnosis not present

## 2018-05-18 DIAGNOSIS — D500 Iron deficiency anemia secondary to blood loss (chronic): Secondary | ICD-10-CM | POA: Diagnosis not present

## 2018-05-18 DIAGNOSIS — R5381 Other malaise: Secondary | ICD-10-CM | POA: Diagnosis not present

## 2018-05-18 DIAGNOSIS — M1000 Idiopathic gout, unspecified site: Secondary | ICD-10-CM | POA: Diagnosis not present

## 2018-05-18 DIAGNOSIS — M10012 Idiopathic gout, left shoulder: Secondary | ICD-10-CM | POA: Diagnosis not present

## 2018-05-25 ENCOUNTER — Other Ambulatory Visit: Payer: Self-pay | Admitting: *Deleted

## 2018-05-25 NOTE — Patient Outreach (Signed)
[place] [facility]) Care Management  05/25/2018  Deanna Spencer 1940-02-17 [id number]   Member discussed in telephonic IDT meeting on yesterday with [facility] UM team and facility staff. [name] is currently at [facility] SNF receiving rehab therapy.   Member assessed for potential [facility] Care Management program services as a benefit of her NextGen Medicare insurance.  Facility staff reports member's daughter will be the best contact for [facility] Care Management discussion. Also noted in chart that permission was given to speak with daughter [name] during most recent hospitalization  Writer attempted to reach member's daughter [name] at [phone]. No answer. Left HIPPA compliant voicemail message requesting call back. Attempted to call [phone]. No answer.  Will plan to outreach again next week to discuss [place] Management services.   Will continue to collaborate with [facility] UM team and facility on member.    [name], MSN-Ed, RN,BSN [place] Acute Care Coordinator [phone]

## 2018-05-29 ENCOUNTER — Other Ambulatory Visit: Payer: Self-pay | Admitting: *Deleted

## 2018-05-29 NOTE — Patient Outreach (Signed)
[place] [facility]) Care Management  05/29/2018  Deanna Spencer Jun 04, 1940 [id number]    Member assessed for potential [place] Management services as a benefit of her NextGen Medicare insurance.  [name] remains at [facility] receiving rehab therapy.  Telephone call made to member at [phone]. Patient identifiers confirmed. Explained [place] Management services. She is agreeable and verbal consent received for [facility] Care Management follow up.   [name] reports her plan is to discharge home this week from SNF. States she is returning to her apartment and lives alone with her daughter to assist.  Denies having any concerns regarding transportation or with meals.   However, [name] reports she can not afford her bladder medication [name] and states insurance will not approve her Carafate in liquid form. Reports she stopped taking her bladder medication in the past because she could not afford it. States "when I am not taking it, I have accidents." [name] is agreeable to [facility] Pharmacist referral.  [name] also agreeable to [place] follow up for complex case management needs. Explained [facility] Care Management will not interfere or replace home health services.   [name] has a history of AFIB, MI, pacer, diverticulitis, CAD, HLD, overactive bladder, HF.  Provided  [name] website information along with telephone number.  [name] expressed appreciation of the call and follow up.  Will plan on making referral to [place] and [facility] Pharmacist upon SNF discharge.  Will continue to collaborate with [facility] UM RN and facility staff.   Confirmed best contact number for [name] is her cell number [phone].    [name], MSN-Ed, RN,BSN [place] Acute Care Coordinator [phone]

## 2018-05-30 DIAGNOSIS — M069 Rheumatoid arthritis, unspecified: Secondary | ICD-10-CM | POA: Diagnosis not present

## 2018-05-30 DIAGNOSIS — K219 Gastro-esophageal reflux disease without esophagitis: Secondary | ICD-10-CM | POA: Diagnosis not present

## 2018-05-30 DIAGNOSIS — I5022 Chronic systolic (congestive) heart failure: Secondary | ICD-10-CM | POA: Diagnosis not present

## 2018-05-30 DIAGNOSIS — I2510 Atherosclerotic heart disease of native coronary artery without angina pectoris: Secondary | ICD-10-CM | POA: Diagnosis not present

## 2018-05-30 DIAGNOSIS — I4891 Unspecified atrial fibrillation: Secondary | ICD-10-CM | POA: Diagnosis not present

## 2018-05-30 DIAGNOSIS — I714 Abdominal aortic aneurysm, without rupture: Secondary | ICD-10-CM | POA: Diagnosis not present

## 2018-05-30 DIAGNOSIS — K284 Chronic or unspecified gastrojejunal ulcer with hemorrhage: Secondary | ICD-10-CM | POA: Diagnosis not present

## 2018-05-30 DIAGNOSIS — D500 Iron deficiency anemia secondary to blood loss (chronic): Secondary | ICD-10-CM | POA: Diagnosis not present

## 2018-06-01 ENCOUNTER — Telehealth: Payer: Self-pay | Admitting: *Deleted

## 2018-06-01 ENCOUNTER — Other Ambulatory Visit: Payer: Self-pay | Admitting: *Deleted

## 2018-06-01 DIAGNOSIS — D649 Anemia, unspecified: Secondary | ICD-10-CM | POA: Diagnosis not present

## 2018-06-01 DIAGNOSIS — Z9884 Bariatric surgery status: Secondary | ICD-10-CM | POA: Diagnosis not present

## 2018-06-01 DIAGNOSIS — I5022 Chronic systolic (congestive) heart failure: Secondary | ICD-10-CM | POA: Diagnosis not present

## 2018-06-01 DIAGNOSIS — N3281 Overactive bladder: Secondary | ICD-10-CM

## 2018-06-01 DIAGNOSIS — I4891 Unspecified atrial fibrillation: Secondary | ICD-10-CM | POA: Diagnosis not present

## 2018-06-01 DIAGNOSIS — I2510 Atherosclerotic heart disease of native coronary artery without angina pectoris: Secondary | ICD-10-CM | POA: Diagnosis not present

## 2018-06-01 DIAGNOSIS — K289 Gastrojejunal ulcer, unspecified as acute or chronic, without hemorrhage or perforation: Secondary | ICD-10-CM | POA: Diagnosis not present

## 2018-06-01 DIAGNOSIS — Z95810 Presence of automatic (implantable) cardiac defibrillator: Secondary | ICD-10-CM | POA: Diagnosis not present

## 2018-06-01 NOTE — Patient Outreach (Signed)
Confirmed in Patient [place] that member discharged from [facility] SNF on 05/31/18.  Member will have home health services and support from daughter. Please see further detail notes from writer's initial discussion with Mrs. Cortopassi on 05/29/18 regarding [place] Management services.   Will make referral to [facility] for complex case management services. Will make referral to [facility] Pharmacist for medication affordability specifically for her bladder medication Toviaz and medication review.    [name], MSN-Ed, RN,BSN [place] Acute Care Coordinator [phone]

## 2018-06-01 NOTE — Telephone Encounter (Signed)
Misty from [facility] calling for RN verbal orders as follows:  1 time(s) weekly for 3 week(s), then 1 time(s) every other week for 6 weeks.   You can leave verbal orders on confidential voicemail.  Christen Bame, CMA

## 2018-06-05 ENCOUNTER — Other Ambulatory Visit: Payer: Self-pay

## 2018-06-05 ENCOUNTER — Other Ambulatory Visit: Payer: Self-pay | Admitting: Pharmacist

## 2018-06-05 NOTE — Patient Outreach (Signed)
[place] [facility]) Care Management  06/05/2018  Deanna Spencer 09-16-1940 [id number]   Patient was called for medication review and medication assistance evaluation. Unfortunately, she did not answer the phone. HIPAA compliant message was left on her voicemail.  Plan: Send patient an unsuccessful outreach letter. Call patient back in 5-7 business days.   Elayne Guerin, PharmD, [facility] Clinical Pharmacist [phone]

## 2018-06-05 NOTE — Patient Outreach (Addendum)
[facility]) Care Management  06/05/2018   Deanna Spencer 03/16/1940 [id number]  Subjective: Member states she has food and denies signs and symptoms of COVID-19.  Objective: 78 year old female with recent hospitalization at [facility] from 05-05-2018 to 05-14-2018 due to c/o abdominal pain and asymptomatic anemia and treated for gastrojejunal ulcer. Member was discharged from hospital to [facility] and then discharged to home on 05-31-2018. Member states she has had 3 previous admissions in the [place] hospital in last 6 months. PMH: Afib; anastomosis and bypass hemorrhage; HTN; GERD; AAA; RA; OSA of R knee; arthritis of R and L hip; CAD; overactive bladder; depression; cardiac pacemaker in situ.   PMH also lists HF; however, member denies being told of HF. Member states she has a scale at home.  Current Medications:  Current Outpatient Medications  Medication Sig Dispense Refill  . acetaminophen (TYLENOL) 500 MG tablet Take 1,000 mg every 6 (six) hours as needed by mouth (pain).     . calcium citrate (CALCITRATE - DOSED IN MG ELEMENTAL CALCIUM) 950 MG tablet Take 2.5 tablets (500 mg of elemental calcium total) by mouth 3 (three) times daily. 200 tablet 0  . colchicine 0.6 MG tablet Take 1 tablet (0.6 mg total) by mouth 2 (two) times daily as needed (gout attacks). (Patient not taking: Reported on 05/05/2018) 30 tablet 0  . digoxin 62.5 MCG TABS Take 0.0625 mg by mouth daily. 30 tablet 0  . famotidine (PEPCID) 40 MG tablet Take 1 tablet (40 mg total) by mouth at bedtime. 30 tablet 0  . gabapentin (NEURONTIN) 300 MG capsule TAKE 2 CAPSULEs(600 MG) BY MOUTH THREE TIMES DAILY (Patient taking differently: Take 600 mg by mouth 3 (three) times daily. ) 360 capsule 6  . lactose free nutrition (BOOST PLUS) LIQD Take 237 mLs by mouth 3 (three) times daily with meals.  0  . metoprolol tartrate (LOPRESSOR) 25 MG tablet Take 1 tablet (25 mg total) by mouth 3 (three) times daily.     . misoprostol (CYTOTEC) 100 MCG tablet Take 1 tablet (100 mcg total) by mouth every 6 (six) hours. 90 tablet 0  . multivitamin (PROSIGHT) TABS tablet Take 1 tablet by mouth 2 (two) times a day. 60 each 0  . oxyCODONE (OXY IR/ROXICODONE) 5 MG immediate release tablet Take 1 tablet (5 mg total) by mouth every 6 (six) hours as needed for moderate pain or severe pain. 15 tablet 0  . pantoprazole (PROTONIX) 40 MG tablet Take 40 mg by mouth 2 (two) times a day.    . polyethylene glycol (MIRALAX / GLYCOLAX) 17 g packet Take 17 g by mouth daily. 14 each 0  . rOPINIRole (REQUIP) 0.5 MG tablet TAKE 1 TABLET BY MOUTH THREE TIMES DAILY (Patient not taking: Reported on 05/05/2018) 90 tablet 0  . senna (SENOKOT) 8.6 MG TABS tablet Take 1 tablet (8.6 mg total) by mouth daily. 30 each 0  . sucralfate (CARAFATE) 1 GM/10ML suspension Take 10 mLs (1 g total) by mouth every 6 (six) hours. 420 mL 0  . TOVIAZ 4 MG TB24 tablet TAKE 1 TABLET(4 MG) BY MOUTH DAILY (Patient taking differently: Take 4 mg by mouth daily. ) 90 tablet 3   No current facility-administered medications for this visit.     Functional Status:  In your present state of health, do you have any difficulty performing the following activities: 05/07/2018 05/07/2018  Hearing? - Y  Vision? - N  Difficulty concentrating or making decisions? -  N  Walking or climbing stairs? - Y  Dressing or bathing? - N  Doing errands, shopping? N -  Some recent data might be hidden    Fall/Depression Screening: Fall Risk  09/22/2017 06/28/2017 12/16/2016  Falls in the past year? No No No   PHQ 2/9 Scores 09/22/2017 06/28/2017 12/16/2016 11/15/2016  PHQ - 2 Score 0 0 0 0    Assessment: Called member at preferred number and member answered phone. HIPPA identities verified. Introduced self and explained [facility] benefits to member. Member verbalized understanding and acceptance of services. RN CM will complete TOC for member.  Member states she lives alone and receives  assistance via her daughter [name] with paying bills, shopping, errands. Member states she is in the process of moving into another apartment on the ground floor that she believes is a handicapped apartment. Member states she is able to bathe, dress, feed herself; however, is done slowly as she has unsteady gait. Member states she has a cane but does not use it. Member states she drove herself in her own vehicle from [place] at least 3 times in last 6 months.  Medications: discussed medications with member according to [facility] discharge instruction sheet from SNF; however, some medications that member states she is taking is not on the list. [facility] pharmacist in process of following up with member's for medication management and assistance.   Depression: member with history of depression and currently denies depression feelings; however, began to cry while assessing for depression. Member stated she had an argument with her daughter [name].   Home Health: Member states she has received one phone call from the home health nurse; however, unable to state name of company. Previous hospital discharge documentation stating [place] and member provided with phone number [phone].  Appointments: Member was not currently schedule for PCP follow up. Encouraged member to call PCP and schedule appointment and member will follow up with PCP tomorrow. Member attempted to call Cardiologist today; however, unsuccessful. Member will discuss Cardiologist appointment with PCP tomorrow.   Member denies transportation needs and states she is able to drive her own vehicle. Member states she drove herself from [place] 3 times in last 6 months.  [facility] CM Care Plan Problem One     Most Recent Value  Care Plan Problem One  knowledge deficit related to medication management as manifested by member unable to correctly state which medications are discontinued.  Role Documenting the Problem One  Care Management  [place] for Problem One  Active  [facility]N Long Term Goal   Member will not experience hospital readmission for next 31 days  [facility]N Long Term Goal Start Date  06/05/18  Interventions for Problem One Long Term Goal  Discussed discharge instructions with member from [place],  Encouraged member to call PCP and schedule appointment  [facility] CM Short Term Goal #1   Member will receive correct medication list from PCP visit tomorrow.  [facility] CM Short Term Goal #1 Start Date  06/05/18  Interventions for Short Term Goal #1  Assessed member for transportation needs to visit PCP tomorrow  [facility] CM Short Term Goal #2   "Over the next 30 days, patient will verbalize basic understanding of COVID-19 impact on individual health and self-health management as evidenced by verbalization of basic understanding of COVID-19 as a viral disease, measures to prevent exposure, signs and symptoms, when to contact provider  [facility] CM Short Term Goal #2 Start Date  06/05/18  Interventions  for Short Term Goal #2  Assessed member's knowledge of COVID-19,  Discussed importance of maintaining social distancing,  effective handwashing,  and wearing mask     Plan: member requests assistance with Toviaz and Carafate medication due to inability to afford. [facility] Pharmacist in process of assisting with medications. Will send SW referral as member is requesting Medicaid assistance. Will send [facility] calendar, welcome packet with consents and [facility] 24 hour nurse advice phone number on magnet. Will send PCP barriers letter; care plan; medications; fall assessment; depression assessment. Will follow up with member next week to complete initial assessment.  [name], RN-BSN  [facility] Care Management  Community Care Management Coordinator  [phone] [email or web address]

## 2018-06-06 ENCOUNTER — Ambulatory Visit (HOSPITAL_COMMUNITY)
Admission: RE | Admit: 2018-06-06 | Discharge: 2018-06-06 | Disposition: A | Discharge status: Home / Self Care | Payer: Medicare Other | Source: Ambulatory Visit | Attending: Family Medicine | Admitting: Family Medicine

## 2018-06-06 ENCOUNTER — Other Ambulatory Visit: Payer: Self-pay

## 2018-06-06 ENCOUNTER — Ambulatory Visit (INDEPENDENT_AMBULATORY_CARE_PROVIDER_SITE_OTHER): Payer: Medicare Other | Admitting: Family Medicine

## 2018-06-06 VITALS — BP 119/60 | HR 103

## 2018-06-06 DIAGNOSIS — D500 Iron deficiency anemia secondary to blood loss (chronic): Secondary | ICD-10-CM

## 2018-06-06 DIAGNOSIS — G8929 Other chronic pain: Secondary | ICD-10-CM

## 2018-06-06 DIAGNOSIS — I4821 Permanent atrial fibrillation: Secondary | ICD-10-CM | POA: Diagnosis not present

## 2018-06-06 DIAGNOSIS — M25512 Pain in left shoulder: Secondary | ICD-10-CM | POA: Diagnosis not present

## 2018-06-06 DIAGNOSIS — M25511 Pain in right shoulder: Secondary | ICD-10-CM

## 2018-06-06 DIAGNOSIS — K289 Gastrojejunal ulcer, unspecified as acute or chronic, without hemorrhage or perforation: Secondary | ICD-10-CM | POA: Diagnosis not present

## 2018-06-06 DIAGNOSIS — Z96612 Presence of left artificial shoulder joint: Secondary | ICD-10-CM | POA: Diagnosis not present

## 2018-06-06 MED ORDER — OXYCODONE HCL 5 MG PO TABS: 5.0000 mg | ORAL_TABLET | Freq: Four times a day (QID) | ORAL | 0 refills | Status: DC | PRN

## 2018-06-06 NOTE — Progress Notes (Signed)
Subjective: Chief Complaint  Patient presents with  . Back Pain  . Hospitalization Follow-up    HPI: Deanna Spencer is a 78 y.o. 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 She received 1U of pRBC while in the hospital. She improved with PPI, misoprostol, and carafate. She was set up with a bariatric surgeon Dr. Vicenta Aly at [facility] for possible reversal of surgery. She was also set up with Cardiology outpatient follow up.  Today she states she feels still weak generally speaking. Her stomach pain is much improved but she is concerned it will return. We discussed her upcoming appointments with [facility] in [place] and with a Cardiologist here in [place]. She is able to eat and drink without any discomfort now. She is continuing Carafate, Pepcid, Pantoprazole for her stomach ulcer. She was started on digoxin by Cardiology inpatient and will need follow up for that.   She is also complaining of left shoulder pain after a fall. She states it hurts "very bad", won't go away, and has limited range of motion. She describes no weakness and no numbness or loss of sensation.  ROS noted in HPI.   Past Medical, Surgical, Social, and Family History Reviewed & Updated per EMR.   Pertinent Historical Findings include:   Social History   Tobacco Use  Smoking Status Never Smoker  Smokeless Tobacco Never Used    Objective: BP 119/60   Pulse (!) 103   SpO2 100%  Vitals and nursing notes reviewed  Physical Exam Gen: Alert and Oriented x 3, NAD HEENT: Normocephalic, atraumatic CV: Irregularly irregular, no murmurs, normal S1, S2 split Resp: CTAB, no wheezing, rales, or rhonchi, comfortable work of breathing Abd: non-distended, non-tender, soft, +bs in all four quadrants MSK: Limited ROM  in L shoulder in extension, flexion, external rotation, 5/5 strength bilaterally, negative empty can test, TTP along the humeral head greater tuberosity, no loss of gross sensation, bilateral distal radius pulses intact, Hawkin's test, empty can test negative Ext: no clubbing, cyanosis, or edema Neuro: No gross deficits Skin: warm, dry, intact, no rashes  LABS Hgb - 11.4  Assessment/Plan:  Atrial fibrillation (HCC) Started on Digoxin by Cardiology while inpatient due to several bouts of A Fib with RVR. - A Fib today, HR wnl.  - Follow up with Dr. Einar Gip  Anemia due to chronic blood loss F/u CBC shows Hgb of 11.4 improved upon hospital d/c - Cont to follow if symptoms of stomach ulcer return  Chronic right shoulder pain Unclear etiology but given age and risk factor for fracture and pain with lack of ROM will obtain x-ray to rule out Fx.  Gastrojejunal ulcer Discussed with her in detail the need to follow up with [facility] for consideration of possible revision of her previous gastric bypass surgeries. Patient understands importance and will be in contact with [facility]. She was given their info by GI while in the hospital. - Cont to follow and see back in clinic after she has met with them.   PATIENT EDUCATION PROVIDED: See AVS    Diagnosis and plan along with any newly prescribed medication(s) were discussed in detail with this patient today. The patient verbalized understanding and agreed with the plan. Patient advised if symptoms worsen return to clinic or ER.    Orders Placed This Encounter  Procedures  .  DG Shoulder Left    Standing Status:   Future    Number of Occurrences:   1    Standing Expiration Date:   08/06/2019    Order Specific Question:   Reason for Exam (SYMPTOM  OR DIAGNOSIS REQUIRED)    Answer:   pain    Order Specific Question:   Preferred imaging location?    Answer:   [facility]    Order Specific Question:   Radiology Contrast Protocol -  do NOT remove file path    Answer:   \\[email or web address]  . CBC with Differential  . Ambulatory referral to Cardiology    Referral Priority:   Routine    Referral Type:   Consultation    Referral Reason:   Specialty Services Required    Requested Specialty:   Cardiology    Number of Visits Requested:   1  . Ambulatory referral to Home Health    Referral Priority:   Routine    Referral Type:   Home Health Care    Referral Reason:   Specialty Services Required    Requested Specialty:   [place]    Number of Visits Requested:   1    Meds ordered this encounter  Medications  . DISCONTD: oxyCODONE (OXY IR/ROXICODONE) 5 MG immediate release tablet    Sig: Take 1 tablet (5 mg total) by mouth every 6 (six) hours as needed for up to 7 days for moderate pain or severe pain.    Dispense:  15 tablet    Refill:  0     Harolyn Rutherford, DO 06/06/2018, 2:47 PM PGY-2 [place]

## 2018-06-06 NOTE — Patient Outreach (Signed)
[name]  06/06/2018  Deanna Spencer Oct 20, 1940 [id number]  RED EMMI #2  Received Red Emmi on yesterday, Tuesday from [facility], [place]. Mickel Baas noted that on yesterday Tuesday at 1047, call was placed to member and result stating that member reported lost interest in things, sad/hopless/anxious/empty.   As noted on yesterday, member reported that she had an argument with her daughter and stated that her daughter began to yell at her. Member began to cry. RN CM placed order for SW to follow up with member as member denied depression feelings; however, crying. Member denies thoughts of harming self or anyone else. Member stated also that her daughter requested that member stay home due to COVID-19 and member states she herself, is an outgoing person. Member stated she drove herself from [place] 3 times in last 6 months. (According to documentation, member tested negative for COVID-19 while in hospital). Encouraged member to express her feelings and provided listening while member talked.  Member to follow up with her PCP today.  [name], RN-BSN  [facility] Care Management  Community Care Management Coordinator  [phone] [email or web address]

## 2018-06-06 NOTE — Patient Instructions (Addendum)
It was great to see you today! Thank you for letting me participate in your care!  Today, we discussed your recent hospitalization and it is important that you be seen and evaluated by [facility]. They set up an appointment with you while you were in the hospital. Please make sure you make this appointment.   I will get a shoulder x-ray for your right shoulder pain. I have also refilled your oxycodone. I have ordered home health for you as well.  I will see you in one month.  Be well, Harolyn Rutherford, DO PGY-2, [facility]

## 2018-06-06 NOTE — Patient Outreach (Addendum)
[facility]) Care Management  06/06/2018  Deanna Spencer [birth year] [id number]   RED EMMI   Received Red Emmi on yesterday, Tuesday from [facility], [place].  Mickel Baas noted that on Saturday 06-02-2018 at 1046, call was placed to member and result stating that member reported unfilled prescriptions.   Followed up with member on yesterday as Monday was Memorial Day. Initial Assessment started.  Medications: Attempted to assist member with medications; however, member states she is unable to afford 2 of her medications: Carafate and Toviaz medications. When attempting to assess medication list, member was viewing SNF discharge summary list of medications; however, member was unsure the current list. Encouraged member yesterday to call PCP. Member called PCP and member will visit PCP today to correct medication list and for post SNF follow up.[facility] Pharmacist in process of contacting member for medication assistance.  Member will also speak to PCP for Cardiology referral.  Wound Care: Member denies having any wounds.  Deanna Mola "ANN" [name], RN-BSN  [facility] Care Management  Community Care Management Coordinator  [phone] [email or web address]

## 2018-06-06 NOTE — Patient Outreach (Signed)
[facility]) Care Management  06/06/2018  Deanna Spencer 1940/08/15 [id number]   Outreach to patient regarding social work referral for assistance with Medicaid application.  Patient was not available to speak at time of call.  BSW will call her back tomorrow.  Ronn Melena, BSW Social Worker [phone]

## 2018-06-07 ENCOUNTER — Other Ambulatory Visit: Payer: Self-pay

## 2018-06-07 LAB — CBC WITH DIFFERENTIAL/PLATELET
Basophils Absolute: 0 10*3/uL (ref 0.0–0.2)
Basos: 1 %
EOS (ABSOLUTE): 0.1 10*3/uL (ref 0.0–0.4)
Eos: 2 %
Hematocrit: 34.3 % (ref 34.0–46.6)
Hemoglobin: 11.4 g/dL (ref 11.1–15.9)
Immature Grans (Abs): 0 10*3/uL (ref 0.0–0.1)
Immature Granulocytes: 0 %
Lymphocytes Absolute: 1.2 10*3/uL (ref 0.7–3.1)
Lymphs: 22 %
MCH: 30.6 pg (ref 26.6–33.0)
MCHC: 33.2 g/dL (ref 31.5–35.7)
MCV: 92 fL (ref 79–97)
Monocytes Absolute: 0.4 10*3/uL (ref 0.1–0.9)
Monocytes: 8 %
Neutrophils Absolute: 3.6 10*3/uL (ref 1.4–7.0)
Neutrophils: 67 %
Platelets: 271 10*3/uL (ref 150–450)
RBC: 3.73 x10E6/uL — ABNORMAL LOW (ref 3.77–5.28)
RDW: 13.1 % (ref 11.7–15.4)
WBC: 5.3 10*3/uL (ref 3.4–10.8)

## 2018-06-07 NOTE — Patient Outreach (Signed)
[facility]) Care Management  06/07/2018  Deanna Spencer 09/23/1940 [id number]  Successful outreach to patient regarding social work referral for assistance with Medicaid application.  Patient requested that application be mailed to her and confirmed that her daughter will be able to assist with completion and gathering of additional documentation to be submitted. Application and list of required documentation mailed to patient today.  Will follow up within the next two weeks to ensure receipt of application.  Ronn Melena, BSW Social Worker [phone]

## 2018-06-08 ENCOUNTER — Telehealth: Payer: Self-pay | Admitting: Family Medicine

## 2018-06-08 NOTE — Telephone Encounter (Signed)
Pt is calling and would like to speak to Dr. Garlan Fillers. She said he told her to give him a call when she needed to. Deanna Spencer

## 2018-06-09 ENCOUNTER — Other Ambulatory Visit: Payer: Self-pay | Admitting: Family Medicine

## 2018-06-09 MED ORDER — SUCRALFATE 1 GM/10ML PO SUSP: 1.0000 g | Freq: Four times a day (QID) | ORAL | 0 refills | Status: DC

## 2018-06-09 MED ORDER — PANTOPRAZOLE SODIUM 40 MG PO TBEC: 40.0000 mg | DELAYED_RELEASE_TABLET | Freq: Two times a day (BID) | ORAL | 2 refills | Status: DC

## 2018-06-09 MED ORDER — FAMOTIDINE 40 MG PO TABS: 40.0000 mg | ORAL_TABLET | Freq: Every day | ORAL | 0 refills | Status: DC

## 2018-06-09 MED ORDER — OXYCODONE HCL 5 MG PO TABS: 5.0000 mg | ORAL_TABLET | Freq: Four times a day (QID) | ORAL | 0 refills | Status: AC | PRN

## 2018-06-09 MED ORDER — FESOTERODINE FUMARATE ER 4 MG PO TB24: 4.0000 mg | ORAL_TABLET | Freq: Every day | ORAL | 11 refills | Status: DC

## 2018-06-09 MED ORDER — MISOPROSTOL 100 MCG PO TABS: 100.0000 ug | ORAL_TABLET | Freq: Four times a day (QID) | ORAL | 0 refills | Status: DC

## 2018-06-09 MED ORDER — COLCHICINE 0.6 MG PO TABS: 0.6000 mg | ORAL_TABLET | Freq: Two times a day (BID) | ORAL | 0 refills | Status: DC | PRN

## 2018-06-09 MED ORDER — ROPINIROLE HCL 0.5 MG PO TABS: 0.5000 mg | ORAL_TABLET | Freq: Three times a day (TID) | ORAL | 0 refills | Status: DC

## 2018-06-09 MED ORDER — DIGOXIN 62.5 MCG PO TABS: 0.0625 mg | ORAL_TABLET | Freq: Every day | ORAL | 0 refills | Status: DC

## 2018-06-09 NOTE — Progress Notes (Signed)
Oxy sent to wrong pharmacy. Sending to correct pharmacy now. Other refills during her visit were also sent to the old pharmacy. Sending all refills to the new pharmacy now and updating her pharmacy in our system.

## 2018-06-13 ENCOUNTER — Other Ambulatory Visit: Payer: Self-pay

## 2018-06-13 NOTE — Patient Outreach (Signed)
[name] Care Management  06/13/2018  Deanna Spencer 18-Sep-1940 [id number]   Called member at preferred number and member answered phone. Member stated she prefers to be called tomorrow around noon.  Will call member tomorrow.   [name], RN-BSN  [name]

## 2018-06-14 ENCOUNTER — Other Ambulatory Visit: Payer: Self-pay | Admitting: Pharmacist

## 2018-06-14 ENCOUNTER — Telehealth: Payer: Self-pay

## 2018-06-14 ENCOUNTER — Ambulatory Visit: Payer: Self-pay

## 2018-06-14 DIAGNOSIS — M25511 Pain in right shoulder: Secondary | ICD-10-CM | POA: Insufficient documentation

## 2018-06-14 DIAGNOSIS — G8929 Other chronic pain: Secondary | ICD-10-CM | POA: Insufficient documentation

## 2018-06-14 NOTE — Patient Outreach (Signed)
[facility]) Care Management  [place]   06/14/2018  Trinisha A Bibbee 03-11-1940 [id number]  Reason for referral: Medication Assistance, Medication Reconciliation Post Discharge, Medication Management  Referral source: Next Gen Current insurance: Next Gen  PMHx includes but not limited to:   AAA, abdominal pain, anemia, a fib, status post pacemaker placement, CHF, GERD, osteoarthritis of right knee and hip, peripheral neuropathy, and seasonal allergies.   Outreach:  Successful telephone call with patient. HIPAA identifiers verified.   Subjective:  Patient reports using pill box as an adherence strategy.  Does the patient ever forget to take medication?  no Does the patient have problems obtaining medications due to transportation?   no Does the patient have problems obtaining medications due to cost?  yes  Does the patient feel that medications prescribed are effective?  yes Does the patient ever experience any side effects to the medications prescribed?  No   Objective: The ASCVD Risk score Mikey Bussing DC Jr., et al., 2013) failed to calculate for the following reasons:   The patient has a prior MI or stroke diagnosis  Lab Results  Component Value Date   CREATININE 0.90 05/12/2018   CREATININE 0.91 05/11/2018   CREATININE 0.83 05/10/2018    No results found for: HGBA1C  Lipid Panel     Component Value Date/Time   CHOL 114 05/05/2018 1845   TRIG 123 05/05/2018 1845   HDL 31 (L) 05/05/2018 1845   CHOLHDL 3.7 05/05/2018 1845   VLDL 25 05/05/2018 1845   LDLCALC 58 05/05/2018 1845    BP Readings from Last 3 Encounters:  06/06/18 119/60  05/14/18 92/75  09/22/17 128/72    Allergies  Allergen Reactions  . Demerol [Meperidine] Anaphylaxis  . 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 Sig Documenting Provider Last Dose Status Informant  acetaminophen (TYLENOL) 500 MG tablet [id number] Yes Take 1,000 mg every 6 (six) hours as needed by mouth (pain).  [provider] Taking Active Self  calcium citrate (CALCITRATE - DOSED IN MG ELEMENTAL CALCIUM) 950 MG tablet [id number] No Take 2.5 tablets (500 mg of elemental calcium total) by mouth 3 (three) times daily.  Patient not taking:  Reported on 06/05/2018   Patriciaann Clan, DO Not Taking Active   ciprofloxacin (CIPRO) 500 MG tablet [id number] Yes Take 500 mg by mouth 2 (two) times daily. [provider] Taking Active   colchicine 0.6 MG tablet [id number] Yes Take 1 tablet (0.6 mg total) by mouth 2 (two) times daily as needed (gout attacks). Nuala Alpha, DO Taking Active Other  digoxin 62.5 MCG TABS [id number]  Take 0.0625 mg by mouth daily. Patriciaann Clan, DO  Active   famotidine (PEPCID) 40 MG tablet [id number]  Take 1 tablet (40 mg total) by mouth at bedtime. Patriciaann Clan, DO  Active   gabapentin (NEURONTIN) 300 MG capsule [id number]  TAKE 2 CAPSULEs(600 MG) BY MOUTH THREE TIMES DAILY  Patient taking differently:  Take 600 mg by mouth 3 (three) times daily.    Nuala Alpha, DO  Active Self  lactose free nutrition (BOOST PLUS) LIQD [id number]  Take 237 mLs by mouth 3 (three) times daily with meals. Patriciaann Clan, DO  Active   metoprolol tartrate (LOPRESSOR) 25  MG tablet [id number] Yes Take 1 tablet (25 mg total) by mouth 3 (three) times daily. Patriciaann Clan, DO Taking Active   metroNIDAZOLE (FLAGYL) 500 MG tablet [id number] Yes Take 500 mg by mouth 3 (three) times daily. [provider] Taking Active Self  misoprostol (CYTOTEC) 100 MCG tablet [id number]  Take 1 tablet (100 mcg total) by mouth every 6 (six) hours. Patriciaann Clan, DO  Active   multivitamin (PROSIGHT) TABS tablet [id number]  Take 1 tablet by mouth 2  (two) times a day. Patriciaann Clan, DO  Active   oxyCODONE (OXY IR/ROXICODONE) 5 MG immediate release tablet [id number] Yes Take 1 tablet (5 mg total) by mouth every 6 (six) hours as needed for moderate pain or severe pain. Patriciaann Clan, DO Taking Active   pantoprazole (PROTONIX) 40 MG tablet [id number] Yes Take 40 mg by mouth 2 (two) times a day. [provider] Taking Active Self  polyethylene glycol (MIRALAX / GLYCOLAX) 17 g packet [id number]  Take 17 g by mouth daily. Patriciaann Clan, DO  Active   rOPINIRole (REQUIP) 0.5 MG tablet [id number] Yes TAKE 1 TABLET BY MOUTH THREE TIMES DAILY Nuala Alpha, DO Taking Active Other           Med Note (ATKINS, HEATHER L   Sat May 05, 2018  8:03 PM) See duplicate entry  senna (SENOKOT) 8.6 MG TABS tablet [id number]  Take 1 tablet (8.6 mg total) by mouth daily. Patriciaann Clan, DO  Active   sucralfate (CARAFATE) 1 GM/10ML suspension [id number] Yes Take 10 mLs (1 g total) by mouth every 6 (six) hours. Patriciaann Clan, DO Taking Active   TOVIAZ 4 MG TB24 tablet [id number] Yes TAKE 1 TABLET(4 MG) BY MOUTH DAILY  Patient taking differently:  Take 4 mg by mouth daily.    Nuala Alpha, DO Taking Active Self          Assessment: Drugs sorted by system:  Neurologic/Psychologic: Gabapentin,   Cardiovascular:  Digoxin, Metoprolol tartrate,   Pulmonary/Allergy:  Gastrointestinal: Famotidine, Boost, Pantoprazole, Sucralfate, Misoprostol  Renal: Cochicine,   Topical:  Pain: Acetaminophen, Oxycodone,  Genitourinary:  Toviaz,    Vitamins/Minerals/Supplements: Calcium Citrate, Multiple Vitamin,    Medication Adherence Findings: Adherence Review  '[]'  Excellent (no doses missed/week)     '[x]'  Good (no more than 1 dose missed/week)     '[]'  Partial (2-3 doses missed/week) '[]'  Poor (>3 doses missed/week)  Patient with good understanding of regimen and good understanding of indications.    Medication Assistance Findings:   Medication assistance needs identified: Toviaz  Extra Help:  May be eligible for -- Extra Help Low Income Subsidy based on reported income and assets---However the patient was not sure about how much her Social Security Check is.  A call was placed to her daughter to inquire about financial documentation. She did not answer the phone. A HIPAA compliant message was left for her.  Patient Assistance Programs: [name] made by [place] requirement met: Unknown o Out-of-pocket prescription expenditure met:   Unknown Reviewed program requirements with patient.    Patient was unsure about her prescription insurance plan. Her retail pharmacy was called and the pharmacist provided the following information:  BIN [id number], PCN [id number] Group:  [id number]  Plan: . Will call patient back in 10-14 business days. . Await a call back from the patient's daughter. . Will route letter to [facility], CPhT to send an application to the patient for Toviaz.   Elayne Guerin,  PharmD, [place] Clinical Pharmacist [phone]

## 2018-06-14 NOTE — Telephone Encounter (Signed)
Sharyn Lull, PT, LVM on nurse line requesting PT evaluation.   Jenetta Downer can be called to [place] [phone].

## 2018-06-14 NOTE — Assessment & Plan Note (Signed)
Started on Digoxin by Cardiology while inpatient due to several bouts of A Fib with RVR. - A Fib today, HR wnl.  - Follow up with Dr. Einar Gip

## 2018-06-15 ENCOUNTER — Other Ambulatory Visit: Payer: Self-pay

## 2018-06-15 NOTE — Patient Outreach (Signed)
[facility]) Care Management  06/15/2018  Melinda A Knapke 10-02-1940 [id number]  Objective: 78 year old female with recent hospitalization at [facility] from 05-05-2018 to 05-14-2018 due to c/o abdominal pain and asymptomatic anemia and treated for gastrojejunal ulcer. Member was discharged from hospital to [facility] and then discharged to home on 05-31-2018. Member states she has had 3 previous admissions in the [place] hospital in last 6 months. PMH: Afib; anastomosis and bypass hemorrhage; HTN; GERD; AAA; RA; OSA of R knee; arthritis of R and L hip; CAD; overactive bladder; depression; cardiac pacemaker in situ. PMH also lists HF; however, member denies being told of HF. Member states she has a scale at home.  Returned call again to member at preferred number and HIPPA identifiers verified.   Member answered more questions for initial assessment. Member denies any current needs at this time.  Will follow up with member next week.  [name], RN-BSN  [facility] Care Management  Community Care Management Coordinator  [phone] [email or web address]

## 2018-06-15 NOTE — Telephone Encounter (Signed)
Was this addressed?  Christen Bame, CMA

## 2018-06-18 ENCOUNTER — Other Ambulatory Visit: Payer: Self-pay

## 2018-06-18 NOTE — Patient Outreach (Signed)
[facility]) Care Management  06/18/2018  Deanna [place] [birth year] [id number]   Referral received from [place] on 05-31-2017 to follow up with member for Complex CM and medication assistance.   78 year old female with recent hospitalization at [facility] from 05-05-2018 to 05-14-2018 due to c/o abdominal pain and asymptomatic anemia and treated for gastrojejunal ulcer. Member was discharged from hospital to [facility] and then discharged to home on 05-31-2018. RN CM completing TOC. Member states she has had 3 previous admissions in the [place] hospital in last 6 months. PMH: Afib; anastomosis and bypass hemorrhage; HTN; GERD; AAA; RA; OSA of R knee; arthritis of R and L hip; CAD; overactive bladder; depression; cardiac pacemaker in situ.   Called member at preferred number and member answered phone. HIPPA identities verified.   Member answered hearing and eye questions from initial assessment review and information documented.  [facility] Pharmacist continues to assist member with medication needs.   Member states she is currently experiencing diarrhea and is in process of calling her provider.   Will follow up with member next week.   THN CM Care Plan Problem One     Most Recent Value  Care Plan Problem One  knowledge deficit related to medication management as manifested by member unable to correctly state which medications are discontinued.  Role Documenting the Problem One  Care Management [place] for Problem One  Active  THN Long Term Goal   Member will not experience hospital readmission for next 31 days  THN Long Term Goal Start Date  06/05/18  Interventions for Problem One Long Term Goal  Assessed member for any current concerns  THN CM Short Term Goal #1   Member will receive correct medication list from PCP visit tomorrow.  THN CM Short Term Goal #1 Start Date  06/05/18  Interventions for Short Term Goal #1  Assisted member with sending [facility]  pharmacist referral and assessed any concerns  THN CM Short Term Goal #2   "Over the next 30 days, patient will verbalize basic understanding of COVID-19 impact on individual health and self-health management as evidenced by verbalization of basic understanding of COVID-19 as a viral disease, measures to prevent exposure, signs and symptoms, when to contact provider  [facility] CM Short Term Goal #2 Start Date  06/05/18  Interventions for Short Term Goal #2  Reinforced COVID-19 education    [place] "ANN" [name], RN-BSN  [place] Management Coordinator  [phone] [email or web address]

## 2018-06-18 NOTE — Patient Outreach (Deleted)
[facility]) Care Management  06/18/2018  Deanna [place] May 19, 1940 [id number]   Called member at preferred number and HIPPA identities verified.   Member answered hearing and eye questions from initial assessment. Member stated she is experiencing diarrhea today and stated she is in process of calling her provider.    Member denies any further needs at this time.   Will follow up with member next week.  Deanna Mola "ANN" [name], RN-BSN  [facility] Care Management  Community Care Management Coordinator  [phone] [email or web address]

## 2018-06-19 ENCOUNTER — Other Ambulatory Visit: Payer: Self-pay | Admitting: Pharmacy Technician

## 2018-06-19 NOTE — Patient Outreach (Signed)
[place] [facility]) Care Management  06/19/2018  Deanna [place] 26-Aug-1940 [id number]                           Medication Assistance Referral  Referral From: [place]  Medication/Company: [name] / [place] Patient application portion:  Mailed Provider application portion: Faxed  to Dr. Nuala Alpha    Follow up:  Will follow up with patient in 5-10 business days to confirm application(s) have been received.  Sabah Zucco P. Sheryle Vice, [place] Management [phone]

## 2018-06-20 ENCOUNTER — Other Ambulatory Visit: Payer: Self-pay

## 2018-06-20 NOTE — Patient Outreach (Signed)
[facility]) Care Management  06/20/2018  Deanna [place] [birth year] [id number]   Successful outreach to patient to ensure receipt of Medicaid application that was mailed on 06/07/18.  Patient confirmed receipt.  Patient reports that her daughter plans to assist her with completion of application when she returns home from out of town tomorrow.  Patient denied any questions or additional needs at this time.  BSW is closing case but encouraged her to call if needs or questions arise.  Ronn Melena, BSW Social Worker [phone]

## 2018-06-21 ENCOUNTER — Telehealth: Payer: Self-pay

## 2018-06-21 ENCOUNTER — Telehealth: Payer: Self-pay | Admitting: Family Medicine

## 2018-06-21 NOTE — Telephone Encounter (Signed)
Pt asking for Dr. Garlan Fillers to call her with her shoulder x-ray results.

## 2018-06-21 NOTE — Assessment & Plan Note (Signed)
Discussed with her in detail the need to follow up with [facility] for consideration of possible revision of her previous gastric bypass surgeries. Patient understands importance and will be in contact with [facility]. She was given their info by GI while in the hospital. - Cont to follow and see back in clinic after she has met with them.

## 2018-06-21 NOTE — Telephone Encounter (Signed)
[place], PT with [facility], called nurse line requesting VO for at home PT:  1x a week for 1 week 2x a week for 3 weeks  1x a week for 1 week   [phone] you can leave a VM.

## 2018-06-21 NOTE — Assessment & Plan Note (Signed)
F/u CBC shows Hgb of 11.4 improved upon hospital d/c - Cont to follow if symptoms of stomach ulcer return

## 2018-06-21 NOTE — Assessment & Plan Note (Signed)
Unclear etiology but given age and risk factor for fracture and pain with lack of ROM will obtain x-ray to rule out Fx.

## 2018-06-25 ENCOUNTER — Ambulatory Visit: Payer: Self-pay | Admitting: Pharmacist

## 2018-06-26 ENCOUNTER — Other Ambulatory Visit: Payer: Self-pay

## 2018-06-27 ENCOUNTER — Other Ambulatory Visit: Payer: Self-pay

## 2018-06-27 NOTE — Patient Outreach (Signed)
[facility]) Care Management  06/27/2018  Deanna [place] 04-12-1940 [id number]    Called member at preferred number and HIPPA identities verified. Member stated that the Physical therapist will arrive soon and requested for RN CM to return call at later time.  Will follow up with member at later time.  [name], RN-BSN  [facility] Care Management  Community Care Management Coordinator  [phone] [email or web address]

## 2018-06-27 NOTE — Patient Outreach (Signed)
[facility]) Care Management  06/27/2018  Deanna [place] July 07, 1940 [id number]  Returned call to member and HIPPA identities verified. Scheduled to call member on Tuesday 07-03-2018 and member verbalized agreement.   Corrected Social Determinant section error and Care Plan error as charted previously in error.  [name], RN-BSN  [facility] Care Management  Community Care Management Coordinator  [phone] [email or web address]

## 2018-06-28 ENCOUNTER — Encounter: Payer: Self-pay | Admitting: Family Medicine

## 2018-06-28 ENCOUNTER — Ambulatory Visit (INDEPENDENT_AMBULATORY_CARE_PROVIDER_SITE_OTHER): Payer: Medicare Other | Admitting: Family Medicine

## 2018-06-28 ENCOUNTER — Other Ambulatory Visit: Payer: Self-pay

## 2018-06-28 VITALS — BP 114/60 | HR 82

## 2018-06-28 DIAGNOSIS — M25512 Pain in left shoulder: Secondary | ICD-10-CM | POA: Diagnosis not present

## 2018-06-28 DIAGNOSIS — I4821 Permanent atrial fibrillation: Secondary | ICD-10-CM | POA: Diagnosis not present

## 2018-06-28 DIAGNOSIS — G6289 Other specified polyneuropathies: Secondary | ICD-10-CM | POA: Diagnosis not present

## 2018-06-28 DIAGNOSIS — G8929 Other chronic pain: Secondary | ICD-10-CM | POA: Diagnosis not present

## 2018-06-28 DIAGNOSIS — K289 Gastrojejunal ulcer, unspecified as acute or chronic, without hemorrhage or perforation: Secondary | ICD-10-CM

## 2018-06-28 MED ORDER — CAPSAICIN-MENTHOL 0.025-5 % EX PTCH: 1.0000 | MEDICATED_PATCH | CUTANEOUS | 2 refills | Status: DC | PRN

## 2018-06-28 MED ORDER — FAMOTIDINE 40 MG PO TABS: 40.0000 mg | ORAL_TABLET | Freq: Every day | ORAL | 0 refills | Status: DC

## 2018-06-28 MED ORDER — DIGOXIN 125 MCG PO TABS: 62.5000 ug | ORAL_TABLET | Freq: Every day | ORAL | 0 refills | Status: DC

## 2018-06-28 MED ORDER — GABAPENTIN 300 MG PO CAPS: ORAL_CAPSULE | ORAL | 6 refills | Status: DC

## 2018-06-28 NOTE — Patient Instructions (Signed)
It was great to see you today! Thank you for letting me participate in your care!  Today, we discussed your continued shoulder pain for which I have referred you to an orthopedic specialist to see what they recommend.  For your bilateral foot pain I have prescribed you a patch. Please apply them to your lower legs as needed.  Be well, Deanna Rutherford, DO PGY-2, [facility]

## 2018-06-28 NOTE — Progress Notes (Signed)
Subjective: Chief Complaint  Patient presents with  . Hospitalization Follow-up    HPI: Deanna [place] is a 78 y.o. presenting to clinic today to discuss the following:  Neuropathy of LE bilateral Patient states her numbness and tingling is getting worse and spreading from her feet to now above her ankles. She says her episodes are also more frequent.  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 Dr. Vicenta Aly at [facility] was the physician she was referred to when she was admitted to the hospital most recently at [facility] in April.  A Fib Was started on digoxin in the hospital on last admission for rate control for A Fib with RVR. She has still not heard from a local Cardiologist here in [place] although I did put in a referral at her last appointment.  Left Shoulder Pain Unclear cause as her x-ray to evaluate if her shoulder replacement had become loose or misaligned was negative. No signs of fracture. No clear cause or inciting incident. Pain is a little better today but she does not appear to be moving it as much.  ROS noted in HPI.   Past Medical, Surgical, Social, and Family History Reviewed & Updated per EMR.   Pertinent Historical Findings include:   Social History   Tobacco Use  Smoking Status Never Smoker  Smokeless Tobacco Never Used    Objective: BP 114/60   Pulse 82   SpO2 95%  Vitals and nursing notes reviewed  Physical Exam Gen: Alert and Oriented x 3, NAD HEENT: Normocephalic, atraumatic CV: Irregularly irregular, no murmurs, normal S1, S2 split Resp: CTAB, no wheezing, rales, or rhonchi, comfortable work of breathing  Abd: non-distended, non-tender, soft, +bs in all four quadrants Ext: no clubbing, cyanosis, or edema Skin: warm, dry, intact, no rashes  No results found for this or any previous visit (from the past 72 hour(s)).  Assessment/Plan:  Gastrojejunal ulcer Patient has still not heard anything from [facility] and seems to not understand she needs to take initiative to follow up with them. - I will attempt to reach out to Dr. Vicenta Aly at [facility] to attempt to find out when her appointment is and help her get to that consultation.  Atrial fibrillation (HCC) HR still within normal rate but still in A Fib. Anticoagulation would be contraindicated to me given her age and bleed risk due to multiple gastric ulcers - I will touch base with our clinic to ensure we have sent the referral request for her to get set up with Dr. Einar Gip  Left shoulder pain Unclear cause. Her exam are non-specific and limited due to pain. She has had falls but is emphatic her pain predates them. X-ray showed shoulder replacement is intact, no fractures - Referral to ortho for eval of left shoulder - Cont topical cream and encouraged her to continue moving her shoulder. I am concerned at this point she is a risk for developing frozen shoulder if she doesn't stay active  Peripheral neuropathy Unclear cause as patients B12, B1, are normal and she is not diabetic. With the aid of Dr. Owens Shark we will consider other less common causes such  as homocystiene,  - Cont Gabapentin 600mg  TID - Trial of Capsaicin patch on both legs PRN - On return visit will proceed with work up to rule out other causes that are less common such as autoimmune disorders, myelomas     On a side note, patient is having extreme stress and difficulty with her daughter to whom she was very close. She states they no longer live together and her daughter is upset with her for reasons she does not understand. I will evaluate her for depression at her next  visit but simply did not have time to tackle this issue at this visit.   PATIENT EDUCATION PROVIDED: See AVS    Diagnosis and plan along with any newly prescribed medication(s) were discussed in detail with this patient today. The patient verbalized understanding and agreed with the plan. Patient advised if symptoms worsen return to clinic or ER.    Orders Placed This Encounter  Procedures  . AMB referral to orthopedics    Referral Priority:   Routine    Referral Type:   Consultation    Number of Visits Requested:   1    Meds ordered this encounter  Medications  . Capsaicin-Menthol 0.025-5 % PTCH    Sig: Apply 1 patch topically as needed.    Dispense:  30 patch    Refill:  [place], DO 06/28/2018, 2:40 PM PGY-2 [place]

## 2018-06-30 DIAGNOSIS — M25512 Pain in left shoulder: Secondary | ICD-10-CM | POA: Insufficient documentation

## 2018-06-30 NOTE — Assessment & Plan Note (Signed)
HR still within normal rate but still in A Fib. Anticoagulation would be contraindicated to me given her age and bleed risk due to multiple gastric ulcers - I will touch base with our clinic to ensure we have sent the referral request for her to get set up with Dr. Einar Gip

## 2018-06-30 NOTE — Assessment & Plan Note (Signed)
Patient has still not heard anything from [facility] and seems to not understand she needs to take initiative to follow up with them. - I will attempt to reach out to Dr. Vicenta Aly at [facility] to attempt to find out when her appointment is and help her get to that consultation.

## 2018-06-30 NOTE — Assessment & Plan Note (Addendum)
Unclear cause as patients B12, B1, are normal and she is not diabetic. With the aid of Dr. Owens Shark we will consider other less common causes such as homocystiene,  - Cont Gabapentin 600mg  TID - Trial of Capsaicin patch on both legs PRN - On return visit will proceed with work up to rule out other causes that are less common such as autoimmune disorders, myelomas

## 2018-06-30 NOTE — Assessment & Plan Note (Signed)
Unclear cause. Her exam are non-specific and limited due to pain. She has had falls but is emphatic her pain predates them. X-ray showed shoulder replacement is intact, no fractures - Referral to ortho for eval of left shoulder - Cont topical cream and encouraged her to continue moving her shoulder. I am concerned at this point she is a risk for developing frozen shoulder if she doesn't stay active

## 2018-07-01 DIAGNOSIS — Z95810 Presence of automatic (implantable) cardiac defibrillator: Secondary | ICD-10-CM | POA: Diagnosis not present

## 2018-07-01 DIAGNOSIS — I4891 Unspecified atrial fibrillation: Secondary | ICD-10-CM | POA: Diagnosis not present

## 2018-07-01 DIAGNOSIS — I2510 Atherosclerotic heart disease of native coronary artery without angina pectoris: Secondary | ICD-10-CM | POA: Diagnosis not present

## 2018-07-01 DIAGNOSIS — I5022 Chronic systolic (congestive) heart failure: Secondary | ICD-10-CM | POA: Diagnosis not present

## 2018-07-01 DIAGNOSIS — Z9884 Bariatric surgery status: Secondary | ICD-10-CM | POA: Diagnosis not present

## 2018-07-01 DIAGNOSIS — D649 Anemia, unspecified: Secondary | ICD-10-CM | POA: Diagnosis not present

## 2018-07-01 DIAGNOSIS — K289 Gastrojejunal ulcer, unspecified as acute or chronic, without hemorrhage or perforation: Secondary | ICD-10-CM | POA: Diagnosis not present

## 2018-07-02 ENCOUNTER — Other Ambulatory Visit: Payer: Self-pay | Admitting: Pharmacy Technician

## 2018-07-02 NOTE — Patient Outreach (Signed)
[facility]) Care Management  07/02/2018  Deanna [place] 15-Aug-1940 [id number]   Successful outreach call placed to patient in regards to [employer] application for [name].  Spoke to patient, HIPAA identifiers verified.  Inquired if patient had received her patient assistance application in the mail. Patient inquired what address did I send it to as she recently moved. Informed patient of the address that was on file and patient informed that the application needed to be mailed to a different address. Patient informed to mail the application to [address], [place], [place],  [address].  Informed patient I would mail out another application on Tuesday 07/16/8673.  Will followup with patient in 5-7 days from 6/23.  Keyasia Jolliff P. Charvi Gammage, [name] Management [phone]

## 2018-07-03 ENCOUNTER — Other Ambulatory Visit: Payer: Self-pay

## 2018-07-03 ENCOUNTER — Telehealth: Payer: Self-pay | Admitting: *Deleted

## 2018-07-03 DIAGNOSIS — Z9884 Bariatric surgery status: Secondary | ICD-10-CM | POA: Diagnosis not present

## 2018-07-03 DIAGNOSIS — I2510 Atherosclerotic heart disease of native coronary artery without angina pectoris: Secondary | ICD-10-CM | POA: Diagnosis not present

## 2018-07-03 DIAGNOSIS — D649 Anemia, unspecified: Secondary | ICD-10-CM | POA: Diagnosis not present

## 2018-07-03 DIAGNOSIS — K289 Gastrojejunal ulcer, unspecified as acute or chronic, without hemorrhage or perforation: Secondary | ICD-10-CM | POA: Diagnosis not present

## 2018-07-03 DIAGNOSIS — I5022 Chronic systolic (congestive) heart failure: Secondary | ICD-10-CM | POA: Diagnosis not present

## 2018-07-03 DIAGNOSIS — I4891 Unspecified atrial fibrillation: Secondary | ICD-10-CM | POA: Diagnosis not present

## 2018-07-03 NOTE — Patient Outreach (Signed)
[facility]) Care Management  07/03/2018  Adanna A Penrose 28-May-1940 [id number]   Referral received from [place] on 05-31-2017 to follow up with member for Complex CM and medication assistance.   78 year old femalewith recent hospitalization at [facility] from 05-05-2018 to 05-14-2018 due to c/o abdominal pain and asymptomatic anemia and treated for gastrojejunal ulcer. Member was discharged from hospital to [facility] andthendischarged to home on 05-31-2018. RN CM completing TOC. Member states she has had 3 previousadmissions in the [place] [facility] in last 6 months. PMH: Afib; anastomosis and bypass hemorrhage; HTN; GERD; AAA; RA; OSA of R knee; arthritis of R and L hip; CAD; overactive bladder; depression; cardiac pacemaker in situ.   Called member at preferred number and member answered phone. HIPPA identity confirmed and member made aware that call is being monitored for quality assurance.   Pain: denies stomach pain. Member states PCP wrote order for Oxycodone medication for PRN Left shoulder pain and RA pain. Member states PCP will refer her to to Orthopedic for evaluation. Member states that Physical Therapy comes to her home twice a week.  Meals: Member states she has food and is able to eat meals without any abdominal concern at this time. Member states she drinks Boost as well. Member states she is having regular bowel movements daily.  Activity: Member states she is currently in the process of boxing her belongings as she is in the process of moving from her daughter's apartment into her own apartment today at [place], [place] [address]. Member states that Physical Therapy comes to her home twice a week. Member states that she goes out with a group of friends once a month for socialization.  Medication: Member states she is unable to afford Toviaz Medication. [facility] Pharmacist sent application for member to complete. This information reinforced  to member and member encouraged to complete application and member verbalized agreement.    Medications Reviewed Today    Reviewed by Brandy Hale, RN (Registered Nurse) on 07/03/18 at 1255  Med List Status: <None>  Medication Order Taking? Sig Documenting Provider Last Dose Status Informant  acetaminophen (TYLENOL) 500 MG tablet [id number] Yes Take 1,000 mg every 6 (six) hours as needed by mouth (pain).  [provider] Taking Active Self  calcium citrate (CALCITRATE - DOSED IN MG ELEMENTAL CALCIUM) 950 MG tablet [id number] Yes Take 2.5 tablets (500 mg of elemental calcium total) by mouth 3 (three) times daily. Patriciaann Clan, DO Taking Active   Capsaicin-Menthol 0.025-5 % [facility] [id number] No Apply 1 patch topically as needed.  Patient not taking: Reported on 07/03/2018   Nuala Alpha, DO Not Taking Active   colchicine 0.6 MG tablet [id number] Yes Take 1 tablet (0.6 mg total) by mouth 2 (two) times daily as needed (gout attacks). Nuala Alpha, DO Taking Active   digoxin (LANOXIN) 0.125 MG tablet [id number] Yes Take 0.5 tablets (62.5 mcg total) by mouth daily. Nuala Alpha, DO Taking Active   famotidine (PEPCID) 40 MG tablet [id number] Yes Take 1 tablet (40 mg total) by mouth at bedtime. Nuala Alpha, DO Taking Active   fesoterodine (TOVIAZ) 4 MG TB24 tablet [id number]  Take 1 tablet (4 mg total) by mouth daily. Nuala Alpha, DO  Active            Med Note (Bralee Feldt A   Tue Jul 03, 2018 12:45 PM) Member states unable to afford medication  gabapentin (NEURONTIN) 300 MG capsule [id number]  Yes TAKE 2 CAPSULEs(600 MG) BY MOUTH THREE TIMES DAILY Nuala Alpha, DO Taking Active   lactose free nutrition (BOOST PLUS) LIQD [id number] Yes Take 237 mLs by mouth 3 (three) times daily with meals. Patriciaann Clan, DO Taking Active   metoprolol tartrate (LOPRESSOR) 25 MG tablet [id number] Yes Take 1 tablet (25 mg total) by mouth 3 (three) times daily. Patriciaann Clan, DO Taking Active   misoprostol (CYTOTEC) 100 MCG tablet [id number]  Take 1 tablet (100 mcg total) by mouth every 6 (six) hours. Nuala Alpha, DO  Active            Med Note (Raja Caputi A   Tue Jul 03, 2018 12:50 PM) Unable to afford per member  multivitamin (PROSIGHT) TABS tablet [id number] Yes Take 1 tablet by mouth 2 (two) times a day. Patriciaann Clan, DO Taking Active   pantoprazole (PROTONIX) 40 MG tablet [id number] No Take 1 tablet (40 mg total) by mouth 2 (two) times a day.  Patient not taking: Reported on 07/03/2018   Nuala Alpha, DO Not Taking Active   polyethylene glycol (MIRALAX / GLYCOLAX) 17 g packet [id number] Yes Take 17 g by mouth daily. Patriciaann Clan, DO Taking Active            Med Note ([employer] A   Tue Jul 03, 2018 12:53 PM) Takes when needed  rOPINIRole (REQUIP) 0.5 MG tablet [id number] Yes Take 1 tablet (0.5 mg total) by mouth 3 (three) times daily. Nuala Alpha, DO Taking Active   sucralfate (CARAFATE) 1 GM/10ML suspension [id number]  Take 10 mLs (1 g total) by mouth every 6 (six) hours. Nuala Alpha, DO  Active            Med Note (Ezra Marquess A   Tue Jul 03, 2018 12:55 PM) Taking 1 gm 4 x/ day          [facility] CM Care Plan Problem One     Most Recent Value  Care Plan Problem One  knowledge deficit related to medication management as manifested by member unable to correctly state which medications are discontinued.  Role Documenting the Problem One  Care Management [place] for Problem One  Active  [facility] Long Term Goal   Member will not experience hospital readmission for next 31 days  [facility] Long Term Goal Start Date  06/05/18  Interventions for Problem One Long Term Goal  Assessed member for abdominal pain   [facility] CM Short Term Goal #1   Member will receive correct medication list from PCP visit tomorrow.  [facility] CM Short Term Goal #1 Start Date  06/05/18  Interventions for Short Term Goal #1  Assessed if member  visited with PCP as scheduled  THN CM Short Term Goal #2   "Over the next 30 days, patient will verbalize basic understanding of COVID-19 impact on individual health and self-health management as evidenced by verbalization of basic understanding of COVID-19 as a viral disease, measures to prevent exposure, signs and symptoms, when to contact provider  [facility] CM Short Term Goal #2 Start Date  06/05/18  Interventions for Short Term Goal #2  Assessed member for complaints of signs and symptoms of COVD-19     Member denies any further concerns. Member states she did not receive packet that was previously mailed. Will resend new packet to member's new address when member complete with moving and member made aware.   Will follow up with member in one month.  Ajene Carchi "  ANN" [name], RN-BSN  [facility] Care Management  Community Care Management Coordinator  [phone] [email or web address]

## 2018-07-03 NOTE — Telephone Encounter (Signed)
Received fax from pharmamcy stating  " There is not a patch that contains 5% menthol with 0.025% capsaicin.  Can we change to 1.25% menthol with 0.025 capsaican"  Christen Bame, CMA

## 2018-07-04 ENCOUNTER — Other Ambulatory Visit: Payer: Self-pay | Admitting: Family Medicine

## 2018-07-04 MED ORDER — CAPSAICIN 0.025 % EX PADS: 1.0000 | MEDICATED_PAD | CUTANEOUS | 2 refills | Status: DC | PRN

## 2018-07-04 NOTE — Progress Notes (Signed)
Sending in a different patch as pharmacy states they do not carry what I originally ordered

## 2018-07-06 ENCOUNTER — Other Ambulatory Visit: Payer: Self-pay | Admitting: Family Medicine

## 2018-07-11 ENCOUNTER — Other Ambulatory Visit: Payer: Self-pay | Admitting: Pharmacy Technician

## 2018-07-11 ENCOUNTER — Telehealth: Payer: Self-pay

## 2018-07-11 NOTE — Patient Outreach (Signed)
[facility]) Care Management  07/11/2018  Deanna Spencer 09/10/1940 [id number]   Successful outreach call placed to patient in regards to [employer] application for [place].  Spoke to patient, HIPAA identifiers verified.  Patient informed she was unsure if she has received the application as she has yet to check her mail. She informed she currently had some contractors in her home. She informed she would check her mail later and call me back. Confirmed with patient that the 2nd address I had on file was the correct address.  Will followup with patient in 3-5 business days if call is not returned.  Kafi Dotter P. Rana Adorno, [place] Management [phone]

## 2018-07-11 NOTE — Telephone Encounter (Signed)
Patient LVM on nurse line stating she has been waiting to hear back from PCP. I did not see any previous notes about her wanting a phone call from him?

## 2018-07-12 ENCOUNTER — Other Ambulatory Visit: Payer: Self-pay

## 2018-07-12 NOTE — Patient Outreach (Signed)
[facility]) Care Management  07/12/2018  Deanna Spencer 14-Nov-1940 [id number]   Member stated she did not receive her welcome package on last conversation. Member states she moved to new address and package requested to be resent again today from [facility]. Provided Web designer with member's new address.  Will follow up with member as currently scheduled.  [name], RN-BSN  [facility] Care Management  Community Care Management Coordinator  [phone] [email or web address]

## 2018-07-15 ENCOUNTER — Emergency Department (HOSPITAL_COMMUNITY): Payer: Medicare Other

## 2018-07-15 ENCOUNTER — Encounter (HOSPITAL_COMMUNITY): Payer: Self-pay | Admitting: Emergency Medicine

## 2018-07-15 ENCOUNTER — Emergency Department (HOSPITAL_COMMUNITY)
Admission: EM | Admit: 2018-07-15 | Discharge: 2018-07-15 | Disposition: A | Discharge status: Home / Self Care | Payer: Medicare Other | Attending: Emergency Medicine | Admitting: Emergency Medicine

## 2018-07-15 ENCOUNTER — Telehealth: Payer: Self-pay | Admitting: Internal Medicine

## 2018-07-15 ENCOUNTER — Other Ambulatory Visit: Payer: Self-pay

## 2018-07-15 DIAGNOSIS — K5732 Diverticulitis of large intestine without perforation or abscess without bleeding: Secondary | ICD-10-CM | POA: Insufficient documentation

## 2018-07-15 DIAGNOSIS — K838 Other specified diseases of biliary tract: Secondary | ICD-10-CM | POA: Diagnosis not present

## 2018-07-15 DIAGNOSIS — Z79899 Other long term (current) drug therapy: Secondary | ICD-10-CM | POA: Insufficient documentation

## 2018-07-15 DIAGNOSIS — I5022 Chronic systolic (congestive) heart failure: Secondary | ICD-10-CM | POA: Insufficient documentation

## 2018-07-15 DIAGNOSIS — R1032 Left lower quadrant pain: Secondary | ICD-10-CM

## 2018-07-15 DIAGNOSIS — Z950 Presence of cardiac pacemaker: Secondary | ICD-10-CM | POA: Insufficient documentation

## 2018-07-15 DIAGNOSIS — I252 Old myocardial infarction: Secondary | ICD-10-CM | POA: Insufficient documentation

## 2018-07-15 DIAGNOSIS — R7989 Other specified abnormal findings of blood chemistry: Secondary | ICD-10-CM

## 2018-07-15 DIAGNOSIS — K5792 Diverticulitis of intestine, part unspecified, without perforation or abscess without bleeding: Secondary | ICD-10-CM

## 2018-07-15 LAB — COMPREHENSIVE METABOLIC PANEL
ALT: 12 U/L (ref 0–44)
AST: 24 U/L (ref 15–41)
Albumin: 3.1 g/dL — ABNORMAL LOW (ref 3.5–5.0)
Alkaline Phosphatase: 67 U/L (ref 38–126)
Anion gap: 11 (ref 5–15)
BUN: 10 mg/dL (ref 8–23)
CO2: 23 mmol/L (ref 22–32)
Calcium: 8.6 mg/dL — ABNORMAL LOW (ref 8.9–10.3)
Chloride: 107 mmol/L (ref 98–111)
Creatinine, Ser: 0.79 mg/dL (ref 0.44–1.00)
GFR calc Af Amer: >60 mL/min (ref >60)
GFR calc non Af Amer: >60 mL/min (ref >60)
Glucose, Bld: 99 mg/dL (ref 70–99)
Potassium: 3.7 mmol/L (ref 3.5–5.1)
Sodium: 141 mmol/L (ref 135–145)
Total Bilirubin: 1.5 mg/dL — ABNORMAL HIGH (ref 0.3–1.2)
Total Protein: 6.2 g/dL — ABNORMAL LOW (ref 6.5–8.1)

## 2018-07-15 LAB — CBC WITH DIFFERENTIAL/PLATELET
Abs Immature Granulocytes: 0.03 10*3/uL (ref 0.00–0.07)
Basophils Absolute: 0.1 10*3/uL (ref 0.0–0.1)
Basophils Relative: 1 %
Eosinophils Absolute: 0.1 10*3/uL (ref 0.0–0.5)
Eosinophils Relative: 1 %
HCT: 40.2 % (ref 36.0–46.0)
Hemoglobin: 12.9 g/dL (ref 12.0–15.0)
Immature Granulocytes: 0 %
Lymphocytes Relative: 9 %
Lymphs Abs: 1 10*3/uL (ref 0.7–4.0)
MCH: 29.8 pg (ref 26.0–34.0)
MCHC: 32.1 g/dL (ref 30.0–36.0)
MCV: 92.8 fL (ref 80.0–100.0)
Monocytes Absolute: 0.6 10*3/uL (ref 0.1–1.0)
Monocytes Relative: 6 %
Neutro Abs: 8.4 10*3/uL — ABNORMAL HIGH (ref 1.7–7.7)
Neutrophils Relative %: 83 %
Platelets: 413 10*3/uL — ABNORMAL HIGH (ref 150–400)
RBC: 4.33 MIL/uL (ref 3.87–5.11)
RDW: 17.2 % — ABNORMAL HIGH (ref 11.5–15.5)
WBC: 10.1 10*3/uL (ref 4.0–10.5)
nRBC: 0 % (ref 0.0–0.2)

## 2018-07-15 LAB — URINALYSIS, ROUTINE W REFLEX MICROSCOPIC
Bacteria, UA: NONE SEEN
Bilirubin Urine: NEGATIVE
Glucose, UA: NEGATIVE mg/dL
Ketones, ur: 5 mg/dL — AB
Nitrite: NEGATIVE
Protein, ur: NEGATIVE mg/dL
Specific Gravity, Urine: 1.028 (ref 1.005–1.030)
pH: 5 (ref 5.0–8.0)

## 2018-07-15 LAB — LACTIC ACID, PLASMA: Lactic Acid, Venous: 1.2 mmol/L (ref 0.5–1.9)

## 2018-07-15 LAB — LIPASE, BLOOD: Lipase: 24 U/L (ref 11–51)

## 2018-07-15 MED ORDER — OXYCODONE HCL 5 MG PO TABS
5.0000 mg | ORAL_TABLET | Freq: Once | ORAL | Status: AC
  Administered 2018-07-15: 5 mg via ORAL
  Filled 2018-07-15: qty 1

## 2018-07-15 MED ORDER — ONDANSETRON HCL 4 MG/2ML IJ SOLN
4.0000 mg | Freq: Once | INTRAMUSCULAR | Status: AC
  Administered 2018-07-15: 16:00:00 4 mg via INTRAVENOUS
  Filled 2018-07-15: qty 2

## 2018-07-15 MED ORDER — HYDROMORPHONE HCL 1 MG/ML IJ SOLN
0.5000 mg | Freq: Once | INTRAMUSCULAR | Status: AC
  Administered 2018-07-15: 18:00:00 0.5 mg via INTRAVENOUS
  Filled 2018-07-15: qty 1

## 2018-07-15 MED ORDER — ONDANSETRON 4 MG PO TBDP: 4.0000 mg | ORAL_TABLET | Freq: Three times a day (TID) | ORAL | 0 refills | Status: DC | PRN

## 2018-07-15 MED ORDER — IOHEXOL 300 MG/ML  SOLN
100.0000 mL | Freq: Once | INTRAMUSCULAR | Status: AC | PRN
  Administered 2018-07-15: 100 mL via INTRAVENOUS

## 2018-07-15 MED ORDER — CIPROFLOXACIN HCL 500 MG PO TABS: 500.0000 mg | ORAL_TABLET | Freq: Two times a day (BID) | ORAL | 0 refills | Status: AC

## 2018-07-15 MED ORDER — MORPHINE SULFATE (PF) 4 MG/ML IV SOLN
4.0000 mg | Freq: Once | INTRAVENOUS | Status: AC
  Administered 2018-07-15: 16:00:00 4 mg via INTRAVENOUS
  Filled 2018-07-15: qty 1

## 2018-07-15 MED ORDER — OXYCODONE HCL 5 MG PO TABS: 5.0000 mg | ORAL_TABLET | ORAL | 0 refills | Status: DC | PRN

## 2018-07-15 MED ORDER — METRONIDAZOLE 500 MG PO TABS: 500.0000 mg | ORAL_TABLET | Freq: Three times a day (TID) | ORAL | 0 refills | Status: AC

## 2018-07-15 NOTE — ED Provider Notes (Signed)
[facility] EMERGENCY DEPARTMENT Provider Note   CSN: [id number] Arrival date & time: 07/15/18  1310     History   Chief Complaint No chief complaint on file.   HPI Deanna Spencer is a 78 y.o. female.     The history is provided by the patient and medical records. No language interpreter was used.  Abdominal Pain Pain location:  LLQ Pain quality: aching   Pain severity:  Severe Onset quality:  Gradual Duration:  3 days Timing:  Constant Progression:  Waxing and waning Chronicity:  Recurrent Context: previous surgery   Relieved by:  Nothing Worsened by:  Nothing Ineffective treatments:  None tried Associated symptoms: nausea   Associated symptoms: no chest pain, no chills, no constipation, no cough, no diarrhea, no dysuria, no fatigue, no fever, no shortness of breath and no vomiting     Past Medical History:  Diagnosis Date  . AAA (abdominal aortic aneurysm) ([place])    3.5cm by CT 11/29/16  . AAA (abdominal aortic aneurysm) without rupture ([place]) 12/21/2016   Stable 3cm 4/20, needs f/u imaging ~4/23  . Acute abdominal pain 05/05/2018  . Anemia   . Atrial fibrillation ([place]) 11/22/2016  . Bee sting 06/28/2017  . Cerumen impaction 06/28/2017  . Childhood asthma    connected to allergies  . Chronic lower back pain   . Chronic systolic heart failure ([place]) 12/14/2016  . Diplopia 09/27/2017  . Diverticulosis   . Duodenal ulcer   . Dyspnea   . Dysrhythmia    a fib  . GERD (gastroesophageal reflux disease)   . GI bleed    "have had both upper and lower GIB"  . GI bleed 05/07/2018  . Gout   . Heart attack ([place]) 2017  . Heart murmur   . History of anastomosis and bypass hemorrhage 05/06/2018  . History of blood transfusion 2017-2018   "related to bleeding ulcers; 47 units PRBC; 2U plasma" (11/23/2016)  . History of duodenal ulcer 11/18/2016  . History of heart attack 11/18/2016  . History of hiatal hernia   . Hx of blood clots    in heart  . IBS  (irritable bowel syndrome)   . Liver lesion 08/07/2017  . Neuropathy   . Osteoarthritis   . Osteoarthritis of right knee 05/30/2017  . Osteoporosis   . Other chronic pain 11/18/2016  . Parasomnia 11/18/2016  . Perforated viscus 11/22/2016  . Peripheral neuropathy   . 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 Presence of Watchman left atrial appendage closure device   . Primary osteoarthritis of left hip 02/24/2017  . 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 Primary osteoarthritis of right knee 06/02/2017  . Raynaud disease   . Rheumatoid arteritis ([place])   . Rheumatoid arthritis involving both hands ([place]) 09/27/2017  . S/P exploratory laparotomy 11/22/2016  . Seasonal allergies 08/07/2017  . 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 SSS (sick sinus syndrome) ([place])    a. s/p Medtronic PPM placement in 2017  . Stomach ulcer   . Vision changes 08/07/2017    Patient Active Problem List   Diagnosis Date Noted  . Left shoulder pain 06/30/2018  . Chronic right shoulder pain 06/14/2018  . Anemia due to chronic blood loss 05/07/2018  . Odynophagia 05/07/2018  . Protein-calorie malnutrition ([place]) 05/07/2018  . Absolute anemia   . Cardiac pacemaker in situ 05/06/2018  . History of anastomosis and bypass hemorrhage 05/06/2018  . Atrial fibrillation  with rapid ventricular response ([place])   . Dehydration   . Bilious vomiting with nausea   . Gastrojejunal ulcer   . Acute abdominal pain 05/05/2018  . Rheumatoid arthritis involving both hands ([place]) 09/27/2017  . Seasonal allergies 08/07/2017  . Primary osteoarthritis of right knee 06/02/2017  . Osteoarthritis of right knee 05/30/2017  . Primary osteoarthritis of left hip 02/24/2017  . AAA (abdominal aortic aneurysm) without rupture ([place]) 12/21/2016  . Chronic systolic heart failure ([place]) 12/14/2016  . Atrial fibrillation ([place]) 11/22/2016  . Peripheral neuropathy 11/22/2016  . GERD (gastroesophageal reflux  disease) 11/22/2016  . Abdominal pain 11/18/2016  . 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 Parasomnia 11/18/2016    Past Surgical History:  Procedure Laterality Date  . ABDOMINAL HYSTERECTOMY    . APPENDECTOMY    . ARTHROPLASTY Right 06/02/2017   RIGHT PATELLA-FEMORAL ARTHROPLASTY, with placement of a cemented 29 mm Stryker asymmetric triathlon patellar button, revision of the tibial polyethylene bearing.  Marland Kitchen BIOPSY  05/06/2018   Procedure: BIOPSY;  Surgeon: Gatha Mayer, MD;  Location: [facility];  Service: Endoscopy;;  . CARPAL TUNNEL RELEASE Bilateral   . CATARACT EXTRACTION W/ INTRAOCULAR LENS  IMPLANT, BILATERAL Bilateral   . CHOLECYSTECTOMY OPEN    . DILATION AND CURETTAGE OF UTERUS    . ESOPHAGOGASTRODUODENOSCOPY (EGD) WITH PROPOFOL N/A 05/06/2018   Procedure: ESOPHAGOGASTRODUODENOSCOPY (EGD) WITH PROPOFOL;  Surgeon: Gatha Mayer, MD;  Location: [facility];  Service: Endoscopy;  Laterality: N/A;  . FOOT FUSION Right   . INGUINAL HERNIA REPAIR Right X 2  . INSERT / REPLACE / REMOVE PACEMAKER  2017   medtronic  . JOINT REPLACEMENT    . LAPAROTOMY N/A 11/21/2016   Procedure: EXPLORATORY LAPAROTOMY, REVISION OF  GASTROJEJUNOSTOMY, ESOPHAGOGASTROSCOPY;  Surgeon: Clovis Riley, MD;  Location: [facility];  Service: General;  Laterality: N/A;  . LEFT ATRIAL APPENDAGE OCCLUSION  2018   placed  via groin  . LUMBAR FUSION  X 2  . PATELLA-FEMORAL ARTHROPLASTY Right 06/02/2017   Procedure: RIGHT PATELLA-FEMORAL ARTHROPLASTY;  Surgeon: Frederik Pear, MD;  Location: [facility];  Service: Orthopedics;  Laterality: Right;  . REVERSE SHOULDER ARTHROPLASTY Left    reverse shoulder replacement  . ROUX-EN-Y GASTRIC BYPASS  2005  . ROUX-EN-Y PROCEDURE  20017 X 2   "revisions"  . TONSILLECTOMY AND ADENOIDECTOMY    . TOTAL HIP ARTHROPLASTY Right 02/24/2017   Procedure: TOTAL HIP ARTHROPLASTY;  Surgeon: Earlie Server, MD;  Location: [facility];  Service:  Orthopedics;  Laterality: Right;  . TOTAL KNEE ARTHROPLASTY Bilateral   . TOTAL SHOULDER ARTHROPLASTY Bilateral   . TUBAL LIGATION       OB History   No obstetric history on file.      Home Medications    Prior to Admission medications   Medication Sig Start Date End Date Taking? Authorizing Provider  acetaminophen (TYLENOL) 500 MG tablet Take 1,000 mg every 6 (six) hours as needed by mouth (pain).     [provider]  calcium citrate (CALCITRATE - DOSED IN MG ELEMENTAL CALCIUM) 950 MG tablet Take 2.5 tablets (500 mg of elemental calcium total) by mouth 3 (three) times daily. 05/14/18   Patriciaann Clan, DO  Capsaicin (CAPSAICIN HOT PATCH) 0.025 % PADS Apply 1 patch topically as needed. 07/04/18   Lockamy, Christia Reading, DO  Capsaicin-Menthol 0.025-5 % PTCH Apply 1 patch topically as needed. Patient not taking: Reported on 07/03/2018 06/28/18   Nuala Alpha, DO  COLCRYS 0.6 MG tablet TAKE 1 TABLET (0.6 MG TOTAL) BY MOUTH 2 (TWO) TIMES DAILY AS NEEDED (GOUT ATTACKS). 07/11/18   Nuala Alpha, DO  digoxin (LANOXIN) 0.125 MG tablet Take 0.5 tablets (62.5 mcg total) by mouth daily. 06/28/18   Nuala Alpha, DO  famotidine (PEPCID) 40 MG tablet Take 1 tablet (40 mg total) by mouth at bedtime. 06/28/18   Nuala Alpha, DO  fesoterodine (TOVIAZ) 4 MG TB24 tablet Take 1 tablet (4 mg total) by mouth daily. 06/09/18   Nuala Alpha, DO  gabapentin (NEURONTIN) 300 MG capsule TAKE 2 CAPSULEs(600 MG) BY MOUTH THREE TIMES DAILY 06/28/18   Nuala Alpha, DO  lactose free nutrition (BOOST PLUS) LIQD Take 237 mLs by mouth 3 (three) times daily with meals. 05/14/18   Patriciaann Clan, DO  metoprolol tartrate (LOPRESSOR) 25 MG tablet Take 1 tablet (25 mg total) by mouth 3 (three) times daily. 05/14/18   Patriciaann Clan, DO  misoprostol (CYTOTEC) 100 MCG tablet Take 1 tablet (100 mcg total) by mouth every 6 (six) hours. 06/09/18   Nuala Alpha, DO  multivitamin (PROSIGHT) TABS tablet Take 1  tablet by mouth 2 (two) times a day. 05/14/18   Patriciaann Clan, DO  oxycodone (OXY-IR) 5 MG capsule Take 5 mg by mouth every 6 (six) hours as needed.    Lockamy, Timothy, DO  pantoprazole (PROTONIX) 40 MG tablet Take 1 tablet (40 mg total) by mouth 2 (two) times a day. Patient not taking: Reported on 07/03/2018 06/09/18   Nuala Alpha, DO  polyethylene glycol (MIRALAX / GLYCOLAX) 17 g packet Take 17 g by mouth daily. 05/15/18   Patriciaann Clan, DO  rOPINIRole (REQUIP) 0.5 MG tablet Take 1 tablet (0.5 mg total) by mouth 3 (three) times daily. 06/09/18   Lockamy, Christia Reading, DO  sucralfate (CARAFATE) 1 GM/10ML suspension Take 10 mLs (1 g total) by mouth every 6 (six) hours. 06/09/18   Nuala Alpha, DO    Family History Family History  Problem Relation Age of Onset  . Heart disease Mother   . Liver disease Father   . Ulcers Father   . Prostate cancer Brother   . Colon cancer Neg Hx     Social History Social History   Tobacco Use  . Smoking status: Never Smoker  . Smokeless tobacco: Never Used  Substance Use Topics  . Alcohol use: No    Frequency: Never    Comment: very rare  . Drug use: No     Allergies   Demerol [meperidine] and Nsaids   Review of Systems Review of Systems  Constitutional: Negative for chills, diaphoresis, fatigue and fever.  HENT: Negative for congestion and rhinorrhea.   Respiratory: Negative for cough, chest tightness, shortness of breath, wheezing and stridor.   Cardiovascular: Negative for chest pain, palpitations and leg swelling.  Gastrointestinal: Positive for abdominal pain and nausea. Negative for constipation, diarrhea and vomiting.  Genitourinary: Negative for dysuria, flank pain and frequency.  Musculoskeletal: Negative for back pain, neck pain and neck stiffness.  Skin: Negative for rash and wound.  Neurological: Negative for light-headedness and headaches.  Psychiatric/Behavioral: Negative for agitation.  All other systems reviewed and  are negative.    Physical Exam Updated Vital Signs BP (!) 116/93 (BP Location: Right Arm)   Pulse 90   Temp 97.9 F (36.6 C) (Oral)   Resp 13   SpO2 99%   Physical Exam Vitals signs and nursing note reviewed.  Constitutional:      General:  She is not in acute distress.    Appearance: She is well-developed. She is not ill-appearing, toxic-appearing or diaphoretic.  HENT:     Head: Normocephalic and atraumatic.     Nose: No congestion or rhinorrhea.     Mouth/Throat:     Mouth: Mucous membranes are moist.     Pharynx: No oropharyngeal exudate or posterior oropharyngeal erythema.  Eyes:     Conjunctiva/sclera: Conjunctivae normal.     Pupils: Pupils are equal, round, and reactive to light.  Neck:     Musculoskeletal: Neck supple.  Cardiovascular:     Rate and Rhythm: Normal rate and regular rhythm.     Heart sounds: No murmur.  Pulmonary:     Effort: Pulmonary effort is normal. No respiratory distress.     Breath sounds: Normal breath sounds. No wheezing, rhonchi or rales.  Chest:     Chest wall: No tenderness.  Abdominal:     General: There is no distension.     Palpations: Abdomen is soft.     Tenderness: There is abdominal tenderness. There is no right CVA tenderness or left CVA tenderness.  Musculoskeletal:        General: No tenderness.     Right lower leg: No edema.     Left lower leg: No edema.  Skin:    General: Skin is warm and dry.     Capillary Refill: Capillary refill takes less than 2 seconds.  Neurological:     General: No focal deficit present.     Mental Status: She is alert and oriented to person, place, and time.  Psychiatric:        Mood and Affect: Mood normal.      ED Treatments / Results  Labs (all labs ordered are listed, but only abnormal results are displayed) Labs Reviewed  CBC WITH DIFFERENTIAL/PLATELET - Abnormal; Notable for the following components:      Result Value   RDW 17.2 (*)    Platelets 413 (*)    Neutro Abs 8.4 (*)     All other components within normal limits  COMPREHENSIVE METABOLIC PANEL - Abnormal; Notable for the following components:   Calcium 8.6 (*)    Total Protein 6.2 (*)    Albumin 3.1 (*)    Total Bilirubin 1.5 (*)    All other components within normal limits  URINALYSIS, ROUTINE W REFLEX MICROSCOPIC - Abnormal; Notable for the following components:   Hgb urine dipstick SMALL (*)    Ketones, ur 5 (*)    Leukocytes,Ua TRACE (*)    All other components within normal limits  URINE CULTURE  LACTIC ACID, PLASMA  LIPASE, BLOOD  LACTIC ACID, PLASMA    EKG EKG Interpretation  Date/Time:  Sunday July 15 2018 13:32:48 EDT Ventricular Rate:  90 PR Interval:    QRS Duration: 128 QT Interval:  394 QTC Calculation: 483 R Axis:   77 Text Interpretation:  Atrial fibrillation Right bundle branch block Nonspecific T abnormalities, lateral leads When compared to prior, no significnt changes from prior. \ No STEMI Confirmed by Antony Blackbird [phone]) on 07/15/2018 1:53:20 PM   Radiology No results found.  Procedures Procedures (including critical care time)  Medications Ordered in ED Medications  morphine 4 MG/ML injection 4 mg (4 mg Intravenous Given 07/15/18 1530)  ondansetron (ZOFRAN) injection 4 mg (4 mg Intravenous Given 07/15/18 1530)     Initial Impression / Assessment and Plan / ED Course  I have reviewed the triage vital signs and  the nursing notes.  Pertinent labs & imaging results that were available during my care of the patient were reviewed by me and considered in my medical decision making (see chart for details).        Deanna Spencer is a 78 y.o. female with a past medical history significant for gastric bypass surgery status post multiple complications with GI bleeding in the past, atrial fibrillation, AAA, GERD, CHF, prior diverticulitis and pacemaker who presents with abdominal pain.  Patient reports she has had abdominal complications and pains for the last few years.   She reports that for the last 3 days she has had left lower quadrant abdominal aching and pain that is 10 out of 10 in severity.  She thinks it may feel similar to prior diverticulitis.  She reports the pain is not sharp and does not radiate to her back.  She is not hypertensive.  She has no leg numbness, tingling, or weakness.  No leg swelling.  She reports some mild nausea but no vomiting.  She denies any trauma.  She denies fevers, chills, chest pain, shortness of breath, palpitations.  She denies other complaints.  She is concerned that she may have mild loose stool with her abdominal pain.  She thinks it may feel like her diverticulitis.  Of note, patient is only taking Tylenol and has not had any more narcotic pain medicine recently.  On exam, left lower quadrant is tender to palpation.  Normal bowel sounds.  Lungs clear chest nontender.  CVA areas nontender.  Patient resting comfortably despite same denies abdominal pain.  Given patient's report that the pain is 10 out of 10 and similar to prior diverticulitis, I suspect this is diverticulitis.  We discussed the imaging type to look for either aortic problem or diverticulitis and as patient's pain is not sharp, her blood pressure was 540 systolic on my evaluation, it is not with her back, we are instead focusing on a CT with contrast instead of the CTA.  Patient is agreeable this plan.  Patient will be given pain medicine and nausea medicine during her work-up.  She will have work-up to look for other electrolyte imbalance or UTI.  Agrees with this.    Care transferred to Dr. Billy Fischer will awaiting for results of diagnostic labs and imaging.  Anticipate following up on these results to determine disposition.  Care transferred in stable condition.  Final Clinical Impressions(s) / ED Diagnoses   Final diagnoses:  LLQ abdominal pain    Clinical Impression: 1. LLQ abdominal pain     Disposition: Care transferred to Dr. Achille Rich  awaiting for results of diagnostic imaging and labs.  This note was prepared with assistance of Systems analyst. Occasional wrong-word or sound-a-like substitutions may have occurred due to the inherent limitations of voice recognition software.      Tegeler, Gwenyth Allegra, MD 07/15/18 [phone]

## 2018-07-15 NOTE — ED Provider Notes (Signed)
  Physical Exam  BP (!) 169/96   Pulse 88   Temp 97.9 F (36.6 C) (Oral)   Resp 16   SpO2 100%   Physical Exam Abdominal:     Tenderness: There is abdominal tenderness (LLQ).     ED Course/Procedures     Angiocath insertion  Date/Time: 07/15/2018 5:32 PM Performed by: Gareth Morgan, MD Authorized by: Gareth Morgan, MD  Consent: Verbal consent obtained. Risks and benefits: risks, benefits and alternatives were discussed Consent given by: patient Required items: required blood products, implants, devices, and special equipment available Patient identity confirmed: verbally with patient Time out: Immediately prior to procedure a "time out" was called to verify the correct patient, procedure, equipment, support staff and site/side marked as required. Preparation: Patient was prepped and draped in the usual sterile fashion. Local anesthesia used: no  Anesthesia: Local anesthesia used: no  Sedation: Patient sedated: no  Patient tolerance: patient tolerated the procedure well with no immediate complications     MDM   78 year old female with a history of 3 cm AAA, atrial fibrillation, diverticulosis, MI, duodenal ulcer, history of gastric bypass with anastomotic ulcer and hemorrhage, admission in April with abdominal pain, hgb drop, presents with concern for LLQ abdominal pain. Please see Dr. Doreene Burke note for further care.  CT ordered and pending.    CT completed showing appearance of likely diverticulitis. Stable mild aneurysmal enlargement of the aorta.  There is increased/recurrent intra and extrahepatic biliary ductal dilation. It is new since April but similar to last year, she has a bilirubin of 1.5, no other liver lab changes, and has no right upper quadrant pain and doubt acute biliary obstruction. Discussed with Dr. Carlean Purl who feels it is reasonable for her to follow up in office with Dr. Loletha Carrow regarding this finding and have repeat labwork for evaluation.   Discussed with patient and daughter. Given pain medication after review in [place] for pain associated with diverticulitis, abx and zofran.  Patient discharged in stable condition with understanding of reasons to return.     Gareth Morgan, MD 07/17/18 2208

## 2018-07-15 NOTE — ED Triage Notes (Signed)
Pt here for eval of abdominal pain that she has had off and on since November. Hx of ulcers. Denies N/V/D or fevers.

## 2018-07-15 NOTE — ED Notes (Signed)
ED Provider at bedside. 

## 2018-07-15 NOTE — Telephone Encounter (Signed)
Patient in ED with diverticulitis Incidental finding of biliary dilation - seen in past also but comes/goes  Bilirubin 1.5, other LFT's NL  Advised we will order some f/u LFT's and arrange f/u Dr. Loletha Carrow - patient being released to home on Abx for diverticulitis

## 2018-07-15 NOTE — ED Notes (Signed)
IV team at bedside 

## 2018-07-16 ENCOUNTER — Telehealth: Payer: Self-pay | Admitting: *Deleted

## 2018-07-16 LAB — URINE CULTURE: Culture: <10000 — AB

## 2018-07-16 NOTE — Telephone Encounter (Signed)
Pt states that Deanna Spencer is too expensive and she wonders if something else can be called in. Christen Bame, CMA

## 2018-07-16 NOTE — Telephone Encounter (Signed)
Linda,  I entered LFTs, but no place to put patient on Dr. Corena Pilgrim sch can you help?

## 2018-07-16 NOTE — Telephone Encounter (Signed)
Pt already scheduled for OV with Dr. Loletha Carrow, she called in and scheduled the appt.

## 2018-07-16 NOTE — Telephone Encounter (Signed)
Pt called insurance and tolterodine 2mg  would be $57 for a 3 month supply.  Please send if appropriate.  Christen Bame, CMA

## 2018-07-16 NOTE — Addendum Note (Signed)
Addended by: Marlon Pel on: 07/16/2018 09:32 AM   Modules accepted: Orders

## 2018-07-17 ENCOUNTER — Other Ambulatory Visit: Payer: Self-pay | Admitting: Pharmacy Technician

## 2018-07-17 ENCOUNTER — Telehealth: Payer: Self-pay | Admitting: *Deleted

## 2018-07-17 ENCOUNTER — Other Ambulatory Visit: Payer: Self-pay | Admitting: Family Medicine

## 2018-07-17 NOTE — Telephone Encounter (Signed)
Kelly from [facility] calling for PT verbal orders as follows:  1 time(s) weekly for 1 week(s), then 1 time(s) weekly for 2 week(s)  You can leave verbal orders on confidential voicemail.  Christen Bame, CMA

## 2018-07-17 NOTE — Patient Outreach (Signed)
[facility]) Care Management  07/17/2018  Deanna Spencer 12/18/1940 [id number]    3rd Successful outreach call placed to patient in regards to [name] patient assistance for [place].  Spoke to patient, HIPAA identifiers verified.  Patient informed she has not checked her mailbox as indicated in our call on 07/11/2018. Patient informed she went to ED on 07/15/2018. She also informed a nurse was coming by this morning. She informed she would try to check the mail later today but it was going to be a busy day as she was trying to still get settled into her new apartment. She also informed that the pharmacy called her provider's office to get the Toviaz changed to Tolteridone due to cost but she was not sure if they had approved the change.  Will route note to [place] that patient assistance is being closed with the option to reopen if patient checks her mail box and sends back the application. Will remove myself from care team.  Luiz Ochoa. Nasser Ku, [place] Management [phone]

## 2018-07-18 ENCOUNTER — Other Ambulatory Visit: Payer: Self-pay

## 2018-07-18 NOTE — Patient Outreach (Signed)
[facility]) Care Management  07/18/2018  Deanna Spencer [birth year] [id number]   Unsuccessful Outreach x 1.   Member with recent ED visit for LLQ abdominal pain and discharged to home. Follow up appointment scheduled to GI on 08-09-2018 and PCP follow up scheduled for 08-01-2018.   Called member at preferred number and no answer. HIPPA compliant voice mail message left introducing self and requested return call when available. Called member's secondary number and member's daughter [name] answered and stated preference for RN CM to call member's number.  Will call member within 3-4 business days.  [name], RN-BSN  [facility] Care Management  Community Care Management Coordinator  [phone] [name]

## 2018-07-22 ENCOUNTER — Other Ambulatory Visit: Payer: Self-pay | Admitting: Family Medicine

## 2018-07-23 ENCOUNTER — Other Ambulatory Visit: Payer: Self-pay | Admitting: Family Medicine

## 2018-07-23 ENCOUNTER — Other Ambulatory Visit: Payer: Self-pay

## 2018-07-23 NOTE — Telephone Encounter (Signed)
Claiborne Billings called to check status. LM on nurse line.  Pt has missed one week of PT due to lack of verbal orders.   Spoke with Dr. Erin Hearing, verbal orders can be given under his name. Called and LM for [place] her of [place] form Dr. Erin Hearing.  Christen Bame, CMA

## 2018-07-23 NOTE — Patient Outreach (Signed)
[facility]) Care Management  07/23/2018  Deanna Spencer [birth year] [id number]   Unsuccessful Outreach x 2.   Member with recent ED visit for LLQ abdominal pain and discharged to home. Follow up appointment scheduled to GI on 08-09-2018 and PCP follow up scheduled for 08-01-2018.   Called member's secondary number and member's daughter [name] answered and stated preference for RN CM to call member's number. Called member's primary number and member answered phone; however, member immediately stated that she is out shopping and will return call at later time.  Will call member within 3-4 business days if no return call from member.  [name], RN-BSN  [facility] Care Management  Community Care Management Coordinator  [phone] [email or web address]

## 2018-07-25 ENCOUNTER — Other Ambulatory Visit: Payer: Self-pay

## 2018-07-25 ENCOUNTER — Ambulatory Visit: Payer: PRIVATE HEALTH INSURANCE

## 2018-07-26 ENCOUNTER — Other Ambulatory Visit: Payer: Self-pay

## 2018-07-26 NOTE — Patient Outreach (Signed)
[facility]) Care Management  07/26/2018  Deanna Spencer 1940-09-23 [id number]   Will follow up with member within 2 weeks for monthly assessment.  [name], RN-BSN  [facility] Care Management  Community Care Management Coordinator  [phone] [email or web address]

## 2018-07-27 ENCOUNTER — Telehealth: Payer: Self-pay | Admitting: *Deleted

## 2018-07-27 NOTE — Telephone Encounter (Signed)
Kelly from [facility] calling for PT verbal orders as follows:  1 time(s) weekly for 4 week(s)  You can leave verbal orders on confidential voicemail.  Christen Bame, CMA

## 2018-07-30 ENCOUNTER — Telehealth: Payer: Self-pay | Admitting: *Deleted

## 2018-07-30 NOTE — Telephone Encounter (Signed)
Sharyn Lull (RN, [facility]) wanted to let MD know that the pt will have the following questions at her appt (pt asked her to call)  1. Wants a specialized diverticulitis diet 2. She has loss weight recently and not sure why 3. She is exhausted.  She has appt on Wed @ 2pm. \ Christen Bame, CMA

## 2018-07-30 NOTE — Telephone Encounter (Signed)
Deanna Spencer called back to check status.   Verbal orders given.  Christen Bame, CMA

## 2018-07-31 ENCOUNTER — Other Ambulatory Visit: Payer: Self-pay | Admitting: Family Medicine

## 2018-07-31 DIAGNOSIS — I2510 Atherosclerotic heart disease of native coronary artery without angina pectoris: Secondary | ICD-10-CM | POA: Diagnosis not present

## 2018-07-31 DIAGNOSIS — I5022 Chronic systolic (congestive) heart failure: Secondary | ICD-10-CM | POA: Diagnosis not present

## 2018-07-31 DIAGNOSIS — Z9884 Bariatric surgery status: Secondary | ICD-10-CM | POA: Diagnosis not present

## 2018-07-31 DIAGNOSIS — Z95810 Presence of automatic (implantable) cardiac defibrillator: Secondary | ICD-10-CM | POA: Diagnosis not present

## 2018-07-31 DIAGNOSIS — I4891 Unspecified atrial fibrillation: Secondary | ICD-10-CM | POA: Diagnosis not present

## 2018-07-31 DIAGNOSIS — K289 Gastrojejunal ulcer, unspecified as acute or chronic, without hemorrhage or perforation: Secondary | ICD-10-CM | POA: Diagnosis not present

## 2018-07-31 DIAGNOSIS — D649 Anemia, unspecified: Secondary | ICD-10-CM | POA: Diagnosis not present

## 2018-07-31 MED ORDER — TOLTERODINE TARTRATE ER 4 MG PO CP24: 4.0000 mg | ORAL_CAPSULE | Freq: Every day | ORAL | 2 refills | Status: DC

## 2018-07-31 NOTE — Progress Notes (Signed)
Due to coverage Deanna Spencer is no longer affordable to the patient. Sending in prescription for Tolterodine. 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   She has an appointment on Wednesday and we will discuss pros and cons and engage in shared decision making in regards on how to best care for her overall health, maintain an acceptable quality of life, and minimize her risk of injury and fall.

## 2018-08-01 ENCOUNTER — Other Ambulatory Visit: Payer: Self-pay | Admitting: Family Medicine

## 2018-08-01 ENCOUNTER — Ambulatory Visit (INDEPENDENT_AMBULATORY_CARE_PROVIDER_SITE_OTHER): Payer: Medicare Other | Admitting: Family Medicine

## 2018-08-01 ENCOUNTER — Other Ambulatory Visit: Payer: Self-pay

## 2018-08-01 ENCOUNTER — Encounter: Payer: Self-pay | Admitting: Family Medicine

## 2018-08-01 VITALS — BP 118/72 | HR 101 | Wt 114.8 lb

## 2018-08-01 DIAGNOSIS — R1084 Generalized abdominal pain: Secondary | ICD-10-CM | POA: Diagnosis not present

## 2018-08-01 DIAGNOSIS — Z0000 Encounter for general adult medical examination without abnormal findings: Secondary | ICD-10-CM

## 2018-08-01 DIAGNOSIS — M069 Rheumatoid arthritis, unspecified: Secondary | ICD-10-CM

## 2018-08-01 DIAGNOSIS — Z1231 Encounter for screening mammogram for malignant neoplasm of breast: Secondary | ICD-10-CM

## 2018-08-01 DIAGNOSIS — R634 Abnormal weight loss: Secondary | ICD-10-CM | POA: Diagnosis not present

## 2018-08-01 DIAGNOSIS — R5383 Other fatigue: Secondary | ICD-10-CM | POA: Diagnosis not present

## 2018-08-01 MED ORDER — GABAPENTIN 300 MG PO CAPS: ORAL_CAPSULE | ORAL | 6 refills | Status: DC

## 2018-08-01 MED ORDER — SERTRALINE HCL 25 MG PO TABS: 25.0000 mg | ORAL_TABLET | Freq: Every day | ORAL | 0 refills | Status: DC

## 2018-08-01 MED ORDER — ROPINIROLE HCL 0.5 MG PO TABS: 0.5000 mg | ORAL_TABLET | Freq: Three times a day (TID) | ORAL | 0 refills | Status: DC

## 2018-08-01 MED ORDER — TRAMADOL HCL 50 MG PO TABS: 50.0000 mg | ORAL_TABLET | Freq: Three times a day (TID) | ORAL | 0 refills | Status: AC | PRN

## 2018-08-01 MED ORDER — METOPROLOL SUCCINATE ER 100 MG PO TB24: 100.0000 mg | ORAL_TABLET | Freq: Every day | ORAL | 3 refills | Status: DC

## 2018-08-01 MED ORDER — TOLTERODINE TARTRATE ER 4 MG PO CP24: 4.0000 mg | ORAL_CAPSULE | Freq: Every day | ORAL | 2 refills | Status: DC

## 2018-08-01 NOTE — Progress Notes (Signed)
Subjective: Chief Complaint  Patient presents with  . Hospitalization Follow-up    HPI: Deanna Spencer is a 78 y.o. presenting to clinic today to discuss the following:  Fatigue Patient returns to clinic for follow up concerning fatigue that has been getting worse since her hospitalization in May. Of note, her daughter who was taking care of many of her medical needs and managing her medications is no longer able to do so. Patient does seem distraught over not being as close with her daughter as she has been in the past. Patient states she falls asleep in her car after getting groceries, is not hungry, and is losing weight. She states "I have no energy" and is visibly upset at today's appointment. She also states she "doesn't know what to eat with diverticulitis" and says "no one has ever told her what to eat so I haven't been eating anything". No one is currently helping her with her medications. Patient does endorse depression due to the current relationship status with her daughter. PHQ-9 score of 30 today and patient does appear very sad. Denies SI or HI.  She does have an appointment with Ortho for her shoulder this upcoming Friday.     ROS noted in HPI.   Past Medical, Surgical, Social, and Family History Reviewed & Updated per EMR.   Pertinent Historical Findings include:   Social History   Tobacco Use  Smoking Status Never Smoker  Smokeless Tobacco Never Used    Objective: BP 118/72   Pulse (!) 101   Wt 114 lb 12.8 oz (52.1 kg)   SpO2 97%   BMI 23.19 kg/m  Vitals and nursing notes reviewed  Physical Exam Gen: Alert and Oriented x 3, NAD HEENT: Normocephalic, atraumatic, PERRLA, EOMI Neck: trachea midline, no thyroidmegaly, no LAD CV: RRR, no murmurs, normal S1, S2 split Resp: CTAB, no wheezing, rales, or rhonchi, comfortable work of breathing Abd: non-distended, non-tender, soft, +bs in all four quadrants Ext: no clubbing, cyanosis, or edema Skin: warm, dry,  intact, no rashes  LABS TSH: 0.956 Free T4: 1.44 CBC Latest Ref Rng & Units 08/01/2018 07/15/2018 06/06/2018  WBC 3.4 - 10.8 x10E3/uL 8.6 10.1 5.3  Hemoglobin 11.1 - 15.9 g/dL 11.9 12.9 11.4  Hematocrit 34.0 - 46.6 % 38.2 40.2 34.3  Platelets 150 - 450 x10E3/uL 457(H) 413(H) 271   CMP Latest Ref Rng & Units 08/01/2018 07/15/2018 05/12/2018  Glucose 65 - 99 mg/dL 93 99 91  BUN 8 - 27 mg/dL 14 10 7(L)  Creatinine 0.57 - 1.00 mg/dL 0.68 0.79 0.90  Sodium 134 - 144 mmol/L 144 141 140  Potassium 3.5 - 5.2 mmol/L 3.6 3.7 3.4(L)  Chloride 96 - 106 mmol/L 104 107 103  CO2 20 - 29 mmol/L 24 23 28   Calcium 8.7 - 10.3 mg/dL 9.0 8.6(L) 9.3  Total Protein 6.0 - 8.5 g/dL 6.2 6.2(L) 5.2(L)  Total Bilirubin 0.0 - 1.2 mg/dL <0.2 1.5(H) 0.5  Alkaline Phos 39 - 117 IU/L 83 67 62  AST 0 - 40 IU/L 17 24 16   ALT 0 - 32 IU/L 7 12 19     Assessment/Plan:  Fatigue I am concerned about Ms Hodkinson as her health has steadily declined since her hospitalization in May. Despite multiple attempts with appointments every month she continues to return with no idea what medications she needs to be taking. She has yet to follow up with Cardiology or with the GI surgical specialist at [facility]. Her overall condition is guarded  if she does not get help meeting her healthcare needs. She has lost 14lbs in the last 5-6 months and this is also concerning. I will continue a work up to rule out any physiologic cause of her weight loss and fatigue.  CBC checked due to history of symptomatic anemia 2/2 to GI bleed. Hemoglobin normal. CMP checked for electrolyte abnormalities and dehydration due to history of poor oral nutrition status but also normal. TSH and Free T4 also normal. Her PHQ-9 score was 30 and  - Mammogram to rule out possible cancer. - Sent another referral to rheumatology as she needs management for her RA - Spoke to [place] and she will research why she has not been contacted by Cardiology for management of her A Fib - Starting  Zoloft 25mg  for one week, then 50mg  and follow up in 2 weeks - Contacting Neoma Laming to help her get [facility] to help her manage her medical appointment and to help administer her medications.   PATIENT EDUCATION PROVIDED: See AVS    Diagnosis and plan along with any newly prescribed medication(s) were discussed in detail with this patient today. The patient verbalized understanding and agreed with the plan. Patient advised if symptoms worsen return to clinic or ER.     Orders Placed This Encounter  Procedures  . TSH + free T4  . CBC  . Comprehensive metabolic panel    Order Specific Question:   Has the patient fasted?    Answer:   No  . Ambulatory referral to Rheumatology    Referral Priority:   Routine    Referral Type:   Consultation    Referral Reason:   Specialty Services Required    Requested Specialty:   Rheumatology    Number of Visits Requested:   1    Meds ordered this encounter  Medications  . metoprolol succinate (TOPROL-XL) 100 MG 24 hr tablet    Sig: Take 1 tablet (100 mg total) by mouth at bedtime. Take with or immediately following a meal.    Dispense:  90 tablet    Refill:  3  . gabapentin (NEURONTIN) 300 MG capsule    Sig: TAKE 1 CAPSULEs(600 MG) BY MOUTH THREE TIMES DAILY    Dispense:  360 capsule    Refill:  6  . sertraline (ZOLOFT) 25 MG tablet    Sig: Take 1 tablet (25 mg total) by mouth daily. After 7 days please increase to 2 tablets per day    Dispense:  21 tablet    Refill:  0  . traMADol (ULTRAM) 50 MG tablet    Sig: Take 1 tablet (50 mg total) by mouth every 8 (eight) hours as needed for up to 7 days.    Dispense:  20 tablet    Refill:  0  . tolterodine (DETROL LA) 4 MG 24 hr capsule    Sig: Take 1 capsule (4 mg total) by mouth daily.    Dispense:  30 capsule    Refill:  2  . rOPINIRole (REQUIP) 0.5 MG tablet    Sig: Take 1 tablet (0.5 mg total) by mouth 3 (three) times daily.    Dispense:  90 tablet    Refill:  0     Harolyn Rutherford, DO  08/01/2018, 2:44 PM PGY-3 [place]

## 2018-08-01 NOTE — Patient Instructions (Addendum)
I have made several medication changes and I will have a home nurse come out and help you go through all the changes we have made. Please take the medications as prescribed. I will call you with the results of your lab work.    I will see you back in two weeks.  Harolyn Rutherford, DO [facility], PGY-3

## 2018-08-02 DIAGNOSIS — K289 Gastrojejunal ulcer, unspecified as acute or chronic, without hemorrhage or perforation: Secondary | ICD-10-CM | POA: Diagnosis not present

## 2018-08-02 DIAGNOSIS — Z9884 Bariatric surgery status: Secondary | ICD-10-CM | POA: Diagnosis not present

## 2018-08-02 DIAGNOSIS — D649 Anemia, unspecified: Secondary | ICD-10-CM | POA: Diagnosis not present

## 2018-08-02 DIAGNOSIS — I4891 Unspecified atrial fibrillation: Secondary | ICD-10-CM | POA: Diagnosis not present

## 2018-08-02 DIAGNOSIS — I5022 Chronic systolic (congestive) heart failure: Secondary | ICD-10-CM | POA: Diagnosis not present

## 2018-08-02 DIAGNOSIS — I2510 Atherosclerotic heart disease of native coronary artery without angina pectoris: Secondary | ICD-10-CM | POA: Diagnosis not present

## 2018-08-02 LAB — COMPREHENSIVE METABOLIC PANEL
ALT: 7 IU/L (ref 0–32)
AST: 17 IU/L (ref 0–40)
Albumin/Globulin Ratio: 1.3 (ref 1.2–2.2)
Albumin: 3.5 g/dL — ABNORMAL LOW (ref 3.7–4.7)
Alkaline Phosphatase: 83 IU/L (ref 39–117)
BUN/Creatinine Ratio: 21 (ref 12–28)
BUN: 14 mg/dL (ref 8–27)
Bilirubin Total: <0.2 mg/dL (ref 0.0–1.2)
CO2: 24 mmol/L (ref 20–29)
Calcium: 9 mg/dL (ref 8.7–10.3)
Chloride: 104 mmol/L (ref 96–106)
Creatinine, Ser: 0.68 mg/dL (ref 0.57–1.00)
GFR calc Af Amer: 98 mL/min/{1.73_m2} (ref >59)
GFR calc non Af Amer: 85 mL/min/{1.73_m2} (ref >59)
Globulin, Total: 2.7 g/dL (ref 1.5–4.5)
Glucose: 93 mg/dL (ref 65–99)
Potassium: 3.6 mmol/L (ref 3.5–5.2)
Sodium: 144 mmol/L (ref 134–144)
Total Protein: 6.2 g/dL (ref 6.0–8.5)

## 2018-08-02 LAB — CBC
Hematocrit: 38.2 % (ref 34.0–46.6)
Hemoglobin: 11.9 g/dL (ref 11.1–15.9)
MCH: 28.6 pg (ref 26.6–33.0)
MCHC: 31.2 g/dL — ABNORMAL LOW (ref 31.5–35.7)
MCV: 92 fL (ref 79–97)
Platelets: 457 10*3/uL — ABNORMAL HIGH (ref 150–450)
RBC: 4.16 x10E6/uL (ref 3.77–5.28)
RDW: 14.4 % (ref 11.7–15.4)
WBC: 8.6 10*3/uL (ref 3.4–10.8)

## 2018-08-02 LAB — TSH+FREE T4
Free T4: 1.44 ng/dL (ref 0.82–1.77)
TSH: 0.956 u[IU]/mL (ref 0.450–4.500)

## 2018-08-06 NOTE — Assessment & Plan Note (Signed)
I am concerned about Deanna Spencer as her health has steadily declined since her hospitalization in May. Despite multiple attempts with appointments every month she continues to return with no idea what medications she needs to be taking. She has yet to follow up with Cardiology or with the GI surgical specialist at [facility]. Her overall condition is guarded if she does not get help meeting her healthcare needs. She has lost 14lbs in the last 5-6 months and this is also concerning. I will continue a work up to rule out any physiologic cause of her weight loss and fatigue.  CBC checked due to history of symptomatic anemia 2/2 to GI bleed. Hemoglobin normal. CMP checked for electrolyte abnormalities and dehydration due to history of poor oral nutrition status but also normal. TSH and Free T4 also normal. Her PHQ-9 score was 30 and  - Mammogram to rule out possible cancer. - Sent another referral to rheumatology as she needs management for her RA - Spoke to [place] and she will research why she has not been contacted by Cardiology for management of her A Fib - Starting Zoloft 25mg  for one week, then 50mg  and follow up in 2 weeks - Contacting Neoma Laming to help her get [facility] to help her manage her medical appointment and to help administer her medications.

## 2018-08-08 ENCOUNTER — Telehealth: Payer: Self-pay | Admitting: General Surgery

## 2018-08-08 DIAGNOSIS — K289 Gastrojejunal ulcer, unspecified as acute or chronic, without hemorrhage or perforation: Secondary | ICD-10-CM | POA: Diagnosis not present

## 2018-08-08 DIAGNOSIS — D649 Anemia, unspecified: Secondary | ICD-10-CM | POA: Diagnosis not present

## 2018-08-08 DIAGNOSIS — Z9884 Bariatric surgery status: Secondary | ICD-10-CM | POA: Diagnosis not present

## 2018-08-08 DIAGNOSIS — I4891 Unspecified atrial fibrillation: Secondary | ICD-10-CM | POA: Diagnosis not present

## 2018-08-08 DIAGNOSIS — I2510 Atherosclerotic heart disease of native coronary artery without angina pectoris: Secondary | ICD-10-CM | POA: Diagnosis not present

## 2018-08-08 DIAGNOSIS — I5022 Chronic systolic (congestive) heart failure: Secondary | ICD-10-CM | POA: Diagnosis not present

## 2018-08-08 NOTE — Telephone Encounter (Signed)
Covid-19 screening questions   Do you now or have you had a fever in the last 14 days?no  Do you have any respiratory symptoms of shortness of breath or cough now or in the last 14 days? no  Do you have any family members or close contacts with diagnosed or suspected Covid-19 in the past 14 days? no  Have you been tested for Covid-19 and found to be positive? No   Patient informed to wear a mask to clinic. She verbalized understanding

## 2018-08-08 NOTE — Telephone Encounter (Signed)
No answer on cell or home number. LM for the patient to call back to Covid screen for her in person visit on 08/09/2018

## 2018-08-09 ENCOUNTER — Ambulatory Visit: Payer: Self-pay

## 2018-08-09 ENCOUNTER — Encounter: Payer: Self-pay | Admitting: Gastroenterology

## 2018-08-09 ENCOUNTER — Ambulatory Visit (INDEPENDENT_AMBULATORY_CARE_PROVIDER_SITE_OTHER): Payer: Medicare Other | Admitting: Gastroenterology

## 2018-08-09 ENCOUNTER — Other Ambulatory Visit: Payer: Self-pay

## 2018-08-09 VITALS — BP 112/78 | HR 76 | Temp 98.1°F | Ht 59.0 in | Wt 114.0 lb

## 2018-08-09 DIAGNOSIS — D500 Iron deficiency anemia secondary to blood loss (chronic): Secondary | ICD-10-CM

## 2018-08-09 DIAGNOSIS — R1032 Left lower quadrant pain: Secondary | ICD-10-CM

## 2018-08-09 DIAGNOSIS — R634 Abnormal weight loss: Secondary | ICD-10-CM

## 2018-08-09 DIAGNOSIS — K289 Gastrojejunal ulcer, unspecified as acute or chronic, without hemorrhage or perforation: Secondary | ICD-10-CM | POA: Diagnosis not present

## 2018-08-09 DIAGNOSIS — T85898A Other specified complication of other internal prosthetic devices, implants and grafts, initial encounter: Secondary | ICD-10-CM

## 2018-08-09 MED ORDER — NA SULFATE-K SULFATE-MG SULF 17.5-3.13-1.6 GM/177ML PO SOLN: 1.0000 | Freq: Once | ORAL | 0 refills | Status: AC

## 2018-08-09 NOTE — Patient Instructions (Addendum)
If you are age 76 or older, your body mass index should be between 23-30. Your Body mass index is 23.03 kg/m. If this is out of the aforementioned range listed, please consider follow up with your Primary Care Provider.  If you are age 37 or younger, your body mass index should be between 19-25. Your Body mass index is 23.03 kg/m. If this is out of the aformentioned range listed, please consider follow up with your Primary Care Provider.   You have been scheduled for an endoscopy and colonoscopy. Please follow the written instructions given to you at your visit today. Please pick up your prep supplies at the pharmacy within the next 1-3 days. If you use inhalers (even only as needed), please bring them with you on the day of your procedure.  We will call you to schedule the iron infusion. These are done at [facility].  It was a pleasure to see you today!  Dr. Loletha Carrow

## 2018-08-09 NOTE — Progress Notes (Signed)
[facility] Progress Note  Chief Complaint: Left lower quadrant pain  Subjective  History: I saw this patient once in November 2018 with history as follows: " This is a 78 year old woman referred by primary care and above and seen with her daughter in attendance today for history of reported duodenal ulcer. Unfortunately, we have no records from her extensive history in [place] prior to her moving here within the last month. Grecia has moved here in order to be closer to her daughter due to her worsening health. 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 Her initial surgery was about 15 years ago, and she reports several serious GI bleeds requiring hospitalization. Within the last 12-18 months she has had 2 surgical revisions of the anastomosis due to bleeding ulcer. There was an episode of bleeding with melena anemia and hospital stay requiring transfusion sometime in September of this year. That apparently did not require surgery. She was put on Carafate and says that a repeat upper endoscopy in late September showed the ulcer had improved. She has not had any overt bleeding since then, and remains on iron supplements. She remains on aspirin due to her history of an MI that apparently occurred about 18 months ago. Her daughter says that at that time she had new onset A. fib and suffered an MI. Therefore, her mother was placed on Coumadin until it was discontinued after placement of a left atrial appendage "watchman" device sometime in the last couple of months. It sounds like that may have occurred around the time of this most recent GI bleed.   Brendy denies abdominal pain or altered bowel habits, but her appetite is only fair she has difficulty maintaining her weight. She requests reevaluation of the ulcer for an assessment of possible rebleeding risk since she hopes to undergo a right hip replacement and  understand she might be anticoagulation for some time after that."  The plan was for an upper endoscopy the following week, but it was not performed because the patient then presented with a perforation at the anastomosis requiring surgical management.  Recent inpatient consult note by Dr. Carlean Purl notes the following history: "Roux-en Y bariatric surgery in ~ 2005. GI bleeding from anastomotic ulcers, occurring over > 15 years, primarily investigated in her previous home of [place].  Has required PRBCs for blood loss anemia.  S/p 2 separate surgical revisions to address anastomotic ulcers in 2017/2018.  Notes mention ulcer eroding into pancreas.   Medical mgt of GIB in 09/2016 with blood transfusion. EGD showed (? Duodenal) ulcer. Carafate added, repeat EGD showed ulcer healed.   Dehiscence of gastrojejunostomy, s/p 11/2016 ex lap, revision (3rd) of gastro-jejunostomy, eophagogastroscopy by Dr Romana Juniper in [place].  Endoscopy by Dr Lucia Gaskins showed viable gastric mucosa and anastomosis, no leak upon instillation of saline.   Pt recalls a colonoscopy within last 18 months, thinks this was in [place] (vs [place], she rotates living from [place] to [place] to [place]...  .Marland KitchenMarland KitchenShe has been admitted to hospitals in [place] 3 times in the last 6 months for recurrences of severe abdominal pain, nausea and vomiting.  Admissions were 11/2017, 02/2018, 04/13/2018 - 04/18/2018 and averaged about a 6-day stay.  She was discharged from that third hospitalization on 04/18/2018.  After that she returned to [facility] 4/10. During these hospitalization she underwent radiologic and upper endoscopic evaluation.  EGD 02/2018 showed gastric ulcer.  02/07/2018 cardiac cath: notes say no CAD, inferior wall akinesis, EF 45%, moderate mitral regurgitation.   Discharge diagnosis most recently included acute UTI, hypovolemic shock, malnutrition, anemia, sigmoid diverticulitis with resolution of diverticulitis on repeat CT.  Discharged to finish Cipro,  flagyl but she stopped these due to N/V.  Continued Protonix 40 BID and Carafate qid"  Discharge summary notes the following:  Acute on chronic abdominal pain: CT abdomen negative for acute intra-abdominal process, CTA without evidence of ischemic bowel.  GI consulted given concurrent anemia and prolonged GI history, performed EGD showing gastrojejunal ulcer at anastomosis with ischemic appearance.  Her original bariatric surgeon in [place], Dr. Dorthula Rue, and our general surgery team were consulted, who recommended potential reversal of bypass via tertiary center if she is unable to tolerate symptomatic treatments.  During her stay, she was started on a PPI BID, Pepcid 40 mg daily, misoprostol TID, and Carafate slurry for symptomatic relief.  She was maintained on a full liquid to soft diet for the majority of her stay.  On day of discharge, she was hemodynamically stable and with minimal abdominal pain remaining.  While she is tolerating symptomatic treatment, she was set up with a [facility] bariatric surgeon, Dr. Vicenta Aly, to discuss further interventions moving forward.   I reviewed the upper endoscopy report by Dr. 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 ________________________________________________  Jordane describes frequent, and sometimes constant, left lower quadrant pain that varies from a 2 to a 10.  It is because multiple hospitalizations in [place] and locally as noted above.  She has been told multiple times she has diverticulitis, but antibiotics have not made it better.  It is always there is a dull ache, then sometimes episodically severe and sharp, without nausea or vomiting.  Her bowel habits tend alternate between constipation and diarrhea, where she may not go for couple of days, then have multiple loose stools.  She denies rectal bleeding.  She is also become quite anxious about being able to care for herself, and she does not know what to  eat.  She feels she has been given either no recommendations or conflicting recommendations on what to eat after her hospital stays.  She has not seen black tarry stool or bright red blood per rectum.   ROS: Cardiovascular:  no chest pain Respiratory: no dyspnea Chronic fatigue Weight loss of at least 15 pounds over the last 6 months. 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  The patient's Past Medical, Family and Social History were reviewed and are on file in the EMR.  Objective:  Med list reviewed  Current Outpatient Medications:    acetaminophen (TYLENOL) 500 MG tablet, Take 1,000 mg every 6 (six) hours as needed by mouth (pain). , Disp: , Rfl:    calcium citrate (CALCITRATE - DOSED IN MG ELEMENTAL CALCIUM) 950 MG tablet, Take 2.5 tablets (500 mg of elemental calcium total) by mouth 3 (three) times daily., Disp: 200 tablet, Rfl: 0   COLCRYS 0.6 MG tablet, TAKE 1 TABLET (0.6 MG TOTAL) BY MOUTH 2 (TWO) TIMES DAILY AS NEEDED (GOUT ATTACKS)., Disp: 30 tablet, Rfl: 0   digoxin (LANOXIN) 0.125 MG tablet, TAKE 1/2 TABLET BY MOUTH DAILY, Disp: 15 tablet, Rfl: 0   gabapentin (NEURONTIN) 300 MG capsule, TAKE 1 CAPSULEs(600 MG) BY MOUTH THREE TIMES DAILY, Disp: 360 capsule, Rfl: 6   lactose free nutrition (BOOST PLUS) LIQD, Take 237 mLs by mouth 3 (three) times daily with meals., Disp: , Rfl:  0   metoprolol succinate (TOPROL-XL) 100 MG 24 hr tablet, Take 1 tablet (100 mg total) by mouth at bedtime. Take with or immediately following a meal., Disp: 90 tablet, Rfl: 3   multivitamin (PROSIGHT) TABS tablet, Take 1 tablet by mouth 2 (two) times a day., Disp: 60 each, Rfl: 0   ondansetron (ZOFRAN ODT) 4 MG disintegrating tablet, Take 1 tablet (4 mg total) by mouth every 8 (eight) hours as needed for nausea or vomiting., Disp: 20 tablet, Rfl: 0   polyethylene glycol (MIRALAX / GLYCOLAX) 17 g packet, Take 17 g by mouth daily., Disp: 14 each, Rfl: 0   rOPINIRole (REQUIP) 0.5 MG tablet,  Take 1 tablet (0.5 mg total) by mouth 3 (three) times daily., Disp: 90 tablet, Rfl: 0   sucralfate (CARAFATE) 1 GM/10ML suspension, Take 10 mLs (1 g total) by mouth every 6 (six) hours., Disp: 420 mL, Rfl: 0   tolterodine (DETROL LA) 4 MG 24 hr capsule, Take 1 capsule (4 mg total) by mouth daily., Disp: 30 capsule, Rfl: 2   famotidine (PEPCID) 40 MG tablet, Take 1 tablet (40 mg total) by mouth at bedtime. (Patient not taking: Reported on 08/09/2018), Disp: 30 tablet, Rfl: 0   pantoprazole (PROTONIX) 40 MG tablet, Take 1 tablet (40 mg total) by mouth 2 (two) times a day. (Patient not taking: Reported on 07/03/2018), Disp: 60 tablet, Rfl: 2   sertraline (ZOLOFT) 25 MG tablet, Take 1 tablet (25 mg total) by mouth daily. After 7 days please increase to 2 tablets per day (Patient not taking: Reported on 08/09/2018), Disp: 21 tablet, Rfl: 0   Vital signs in last 24 hrs: Vitals:   08/09/18 1614  BP: 112/78  Pulse: 76  Temp: 98.1 F (36.7 C)  SpO2: 98%    Physical Exam  Thin.  Skin is loose in some areas indicating weight loss.  HEENT: sclera anicteric, oral mucosa moist without lesions  Neck: supple, no thyromegaly, JVD or lymphadenopathy  Cardiac: RRR without murmurs, S1S2 heard, no peripheral edema  Pulm: clear to auscultation bilaterally, normal RR and effort noted  Abdomen: soft, left lower quadrant tenderness, with active bowel sounds. No guarding or palpable hepatosplenomegaly.  Ultimately surgical scars, including a long midline upper that extends well up to the sternum and right chest wall and another vertical 1 on the right side of the abdomen  Skin; warm and dry, no jaundice or rash  Recent Labs:  CBC Latest Ref Rng & Units 08/01/2018 07/15/2018 06/06/2018  WBC 3.4 - 10.8 x10E3/uL 8.6 10.1 5.3  Hemoglobin 11.1 - 15.9 g/dL 11.9 12.9 11.4  Hematocrit 34.0 - 46.6 % 38.2 40.2 34.3  Platelets 150 - 450 x10E3/uL 457(H) 413(H) 271   CMP Latest Ref Rng & Units 08/01/2018 07/15/2018  05/12/2018  Glucose 65 - 99 mg/dL 93 99 91  BUN 8 - 27 mg/dL 14 10 7(L)  Creatinine 0.57 - 1.00 mg/dL 0.68 0.79 0.90  Sodium 134 - 144 mmol/L 144 141 140  Potassium 3.5 - 5.2 mmol/L 3.6 3.7 3.4(L)  Chloride 96 - 106 mmol/L 104 107 103  CO2 20 - 29 mmol/L 24 23 28   Calcium 8.7 - 10.3 mg/dL 9.0 8.6(L) 9.3  Total Protein 6.0 - 8.5 g/dL 6.2 6.2(L) 5.2(L)  Total Bilirubin 0.0 - 1.2 mg/dL <0.2 1.5(H) 0.5  Alkaline Phos 39 - 117 IU/L 83 67 62  AST 0 - 40 IU/L 17 24 16   ALT 0 - 32 IU/L 7 12 19   Albumin 3.5  O96 and folic acid normal in April Iron 8, 3% saturation, ferritin 418 Radiologic studies:  CT abdomen and pelvis from 07/15/2018:  CLINICAL DATA:  78 year old female with left lower quadrant pain since yesterday, feel similar to prior diverticulitis.   EXAM: CT ABDOMEN AND PELVIS WITH CONTRAST   TECHNIQUE: Multidetector CT imaging of the abdomen and pelvis was performed using the standard protocol following bolus administration of intravenous contrast.   CONTRAST:  11mL OMNIPAQUE IOHEXOL 300 MG/ML  SOLN   COMPARISON:  CT Abdomen and Pelvis 05/05/2018 and earlier.   FINDINGS: Lower chest: Stable cardiomegaly with partially visible cardiac pacemaker leads. No pericardial effusion. Stable lung bases, chronic fat containing left Bochdalek's hernia. Ectatic descending thoracic aorta.   Hepatobiliary: Surgically absent gallbladder with chronic but increased since April intra-and extrahepatic biliary ductal dilatation. See series 3, image 17. The appearance is similar to the CT on 08/08/2017, the CBD is 13-14 millimeters now (12 millimeters in 2019). No obstructing etiology identified. No suspicious liver lesion.   Pancreas: Stable pancreatic atrophy. No pancreatic ductal dilatation.   Spleen: Stable and negative.   Adrenals/Urinary Tract: Normal adrenal glands. Stable lobulated appearance of both kidneys with left greater than right renal cortical scarring. Right renal  midpole cyst redemonstrated. Still there is symmetric renal enhancement and contrast excretion. Proximal ureters are decompressed and within normal limits. Diminutive urinary bladder.   Stomach/Bowel:   Negative rectum. Severe diverticulosis of the sigmoid colon with an indistinct appearance of the sigmoid wall today on series 3, image 56.   Diverticulosis continues throughout the left colon, some of which is obscured by streak artifact from the lumbar hardware. There is no left gutter fluid or stranding.   The transverse colon is redundant but mostly decompressed. There is diverticulosis of the hepatic flexure and ascending colon but no active inflammation identified. The right colon is also fairly decompressed. Negative terminal ileum. Nonvisualized appendix.   Fluid-filled but nondilated distal small bowel. No dilated small bowel. Sequelae of gastrojejunostomy appears stable. No free air. No abdominal free fluid.   Vascular/Lymphatic: Generalized ectasia and calcified atherosclerosis of the aorta. Mildly aneurysmal infrarenal aorta is stable, 32 millimeters diameter. Major arterial structures remain patent. Portal venous system appears patent.   Reproductive: Appears to be surgically absent as before.   Other: Trace pelvic free fluid or dependent fat stranding on series 3, image 57 is new. Streak artifact from the right hip limits some visualization of the pelvis.   Musculoskeletal: Occasional chronic lower rib fractures. Previous lumbar surgery. Bands to degenerative changes in the spine with levoconvex scoliosis. Stable spinal hardware. Superimposed chronic right hip arthroplasty. Chronic deformity of the anterior right iliac wing. Chronic left pubic rami fractures. No acute osseous abnormality identified.   IMPRESSION: 1. Chronic sigmoid diverticulosis with an indistinct appearance of the sigmoid wall today and trace pelvic free fluid suspicious for mild acute  diverticulitis. No associated abscess or complicating features.   2. Increased/recurrent intra and extrahepatic biliary ductal dilatation. The bile ducts are substantially larger than in April, but not too dissimilar from an CT Abdomen and Pelvis in 2019. If there is hyperbilirubinemia consider a CBD obstruction.   3. 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 Recommend followup by Ultrasound in 3 years. This recommendation follows ACR consensus guidelines: White Paper of the ACR Incidental Findings Committee II on Vascular Findings. J Am Coll Radiol 2013; 10:789-794. Aortic aneurysm NOS (ICD10-I71.9)     Electronically Signed  By: Genevie Ann M.D.   On: 07/15/2018 17:32  I personally reviewed the CT images.  There is considerable beam hardening artifact in the gallbladder fossa and more so in the pelvis, significantly reducing visualization of the left colon.  @ASSESSMENTPLANBEGIN @ Assessment: Encounter Diagnoses  Name Primary?   Left lower quadrant abdominal pain Yes   Anemia due to chronic blood loss    Anastomotic ulcer S/P gastric bypass    This was a very challenging reevaluation, as it has been over a year and a half since I last saw her and she has had a very complex history since then.  Extensive records were reviewed and she has multiple complex issues.  There is clearly a chronic, ischemic nonhealing gastrojejunal anastomotic ulcer that I cannot make better with medicines.  It is not clear to what extent that is contributing to the abdominal pain, but she does seem to feel worse after meals.  I do not want her restricting her diet in a particular way since she has been losing weight, and there is no convincing evidence that dietary additions or subtractions would affect the ulcer or anything related to the diverticulosis.  It is also not clear to me that he has really had  diverticulitis.  There is certainly severe diverticulosis in the left colon with associated wall thickening, but there is not necessarily chronic inflammation or infection in this area.  I suspect that adhesive disease from multiple surgeries is at least part of why she has ongoing abdominal pain and perhaps the alternating bowel habits as well. She has not had a colonoscopy in many years by her report.  She needs IV iron to keep from becoming profoundly anemic as she was several months ago.  Lastly, our local surgical consultants have not seen her since the hospital discharge in April.  It is not clear that had anything further to offer at that point, but if it is felt that this anastomotic ulcer needs specialty surgical evaluation, then we will need their assistance communicating that to what ever surgeon they recommend at a tertiary institution.  Plan: Upper endoscopy to reevaluate the ulcer Colonoscopy for left lower quadrant pain and chronic diarrhea and also to rule out malignancy with ongoing weight loss.  My office will also work on an outpatient Feraheme infusion.   Total time 50 minutes, 30 spent face-to-face with patient in counseling and coordination of care.   Nelida Meuse III

## 2018-08-10 ENCOUNTER — Ambulatory Visit: Payer: Self-pay | Admitting: Pharmacist

## 2018-08-10 ENCOUNTER — Other Ambulatory Visit: Payer: Self-pay | Admitting: Pharmacist

## 2018-08-10 ENCOUNTER — Telehealth: Payer: Self-pay

## 2018-08-10 ENCOUNTER — Other Ambulatory Visit: Payer: Self-pay

## 2018-08-10 DIAGNOSIS — D500 Iron deficiency anemia secondary to blood loss (chronic): Secondary | ICD-10-CM

## 2018-08-10 NOTE — Telephone Encounter (Signed)
Pt has been schedule for the Feraheme iv infusion for 08-17-2018 @ 8am.  Left a message to return call.

## 2018-08-10 NOTE — Patient Outreach (Addendum)
[facility]) Care Management  08/10/2018  Deanna Spencer 27-Feb-1940 [id number]  Patient was called to follow up on medication assistance. Unfortunately, she did not answer her phone. HIPAA compliant message was left on her voicemail. The alternate mobile number was also called but a voicemail could not be left as the voicemail was full.     [facility] Certified Pharmacy Technician, Sharee Pimple Simcox has been attempting to get the patient assistance applications that were sent returned to [facility] without success.  Plan: Send patient an unsuccessful outreach letter. Call patient back in 10-14 business days due to upcoming [place], PharmD, [place] Clinical Pharmacist [phone]  ADDENDUM  Patient called back. HIPAA identifiers were obtained.  She said she did not receive the first 2 mailings we sent her.    Plan: Route note to Sharee Pimple Simcox to send the patient a 3rd application and return addressed envelope.  Follow up with patient in 2 weeks.  Elayne Guerin, PharmD, [place] Clinical Pharmacist [phone]

## 2018-08-13 NOTE — Telephone Encounter (Signed)
Patient has been advised that she is scheduled for feraheme infusion on 08/17/18 at 8am at [facility]. She verbalizes understanding.

## 2018-08-14 ENCOUNTER — Other Ambulatory Visit: Payer: Self-pay

## 2018-08-14 ENCOUNTER — Other Ambulatory Visit: Payer: Self-pay | Admitting: Pharmacy Technician

## 2018-08-14 ENCOUNTER — Telehealth: Payer: Self-pay | Admitting: Pharmacist

## 2018-08-14 ENCOUNTER — Ambulatory Visit
Admission: RE | Admit: 2018-08-14 | Discharge: 2018-08-14 | Disposition: A | Discharge status: Home / Self Care | Payer: Medicare Other | Source: Ambulatory Visit | Attending: *Deleted | Admitting: *Deleted

## 2018-08-14 DIAGNOSIS — I2510 Atherosclerotic heart disease of native coronary artery without angina pectoris: Secondary | ICD-10-CM | POA: Diagnosis not present

## 2018-08-14 DIAGNOSIS — Z1231 Encounter for screening mammogram for malignant neoplasm of breast: Secondary | ICD-10-CM

## 2018-08-14 DIAGNOSIS — Z9884 Bariatric surgery status: Secondary | ICD-10-CM | POA: Diagnosis not present

## 2018-08-14 DIAGNOSIS — K289 Gastrojejunal ulcer, unspecified as acute or chronic, without hemorrhage or perforation: Secondary | ICD-10-CM | POA: Diagnosis not present

## 2018-08-14 DIAGNOSIS — I5022 Chronic systolic (congestive) heart failure: Secondary | ICD-10-CM | POA: Diagnosis not present

## 2018-08-14 DIAGNOSIS — D649 Anemia, unspecified: Secondary | ICD-10-CM | POA: Diagnosis not present

## 2018-08-14 DIAGNOSIS — I4891 Unspecified atrial fibrillation: Secondary | ICD-10-CM | POA: Diagnosis not present

## 2018-08-14 NOTE — Telephone Encounter (Signed)
-----   Message from Jason Fila, CPhT sent at 08/14/2018 11:25 AM EDT ----- The address on the letter does not match the address on her file. ----- Message ----- From: Elayne Guerin, [place]: 08/14/2018   9:29 AM EDT To: Luiz Ochoa Simcox, CPhT  Hey! I do not. I assumed it was the address we have in the system. Did you send it to a different address before?  Blessings,  Elayne Guerin, PharmD, [place] Clinical Pharmacist [phone]  ----- Message ----- From: Jason Fila, CPhT Sent: 08/14/2018   8:57 AM EDT To: Elayne Guerin, [place]  Do you know which address she wants me to send it to this 3rd time? ----- Message ----- From: Elayne Guerin, [place]: 08/10/2018  12:53 PM EDT To: Luiz Ochoa Simcox, CPhT  Please send patient a 3rd application :(  Blessings,  Elayne Guerin, PharmD, [place] Clinical Pharmacist [phone]

## 2018-08-14 NOTE — Patient Outreach (Signed)
[facility]) Care Management  08/14/2018  Deanna Spencer Sep 15, 1940 [id number]    Received request from [place] to mail patient the [place] application for the 3rd time but was unsure of to which address as the address on the letter and on the patient's file does not match.  Successful outreach call placed to patient, HIPAA identifiers verified.  Patient informed to mail the application to the [facility] address which is the address on the patient's file. Informed patient letter was being mailed today.  Will submit application to [place] once application has been returned.  [name] P. [name], [place] Management [phone]

## 2018-08-14 NOTE — Patient Outreach (Signed)
[facility]) Care Management  08/14/2018  Deanna Spencer 1940-12-07 [id number]  Patient was called to follow up on medication assistance. Unfortunately, she did not answer the phone. HIPAA compliant message left on her voicemail.   Purpose of today's call was to determine the correct address to re-send her patient assistance applications.  Before finishing this note, Sharee Pimple Simcox, CPhT spoke with the patient and verified the correct address.  Plan: Susy Frizzle, CPhT will follow for patient assistance. Follow up with patient in 8-10 weeks.   Elayne Guerin, PharmD, [place] Clinical Pharmacist [phone]

## 2018-08-15 ENCOUNTER — Ambulatory Visit (INDEPENDENT_AMBULATORY_CARE_PROVIDER_SITE_OTHER): Payer: Medicare Other | Admitting: Family Medicine

## 2018-08-15 ENCOUNTER — Ambulatory Visit: Payer: Self-pay

## 2018-08-15 ENCOUNTER — Other Ambulatory Visit: Payer: Self-pay

## 2018-08-15 ENCOUNTER — Encounter: Payer: Self-pay | Admitting: Family Medicine

## 2018-08-15 VITALS — BP 98/62 | HR 102 | Wt 113.4 lb

## 2018-08-15 DIAGNOSIS — M25512 Pain in left shoulder: Secondary | ICD-10-CM | POA: Diagnosis not present

## 2018-08-15 DIAGNOSIS — R5383 Other fatigue: Secondary | ICD-10-CM

## 2018-08-15 MED ORDER — TRAMADOL HCL 50 MG PO TABS: 50.0000 mg | ORAL_TABLET | Freq: Three times a day (TID) | ORAL | 0 refills | Status: AC | PRN

## 2018-08-15 MED ORDER — SERTRALINE HCL 50 MG PO TABS: 50.0000 mg | ORAL_TABLET | Freq: Every day | ORAL | 3 refills | Status: DC

## 2018-08-15 NOTE — Progress Notes (Signed)
     Subjective: Chief Complaint  Patient presents with  . Follow-up    HPI: Deanna Spencer is a 78 y.o. presenting to clinic today to discuss the following:  Follow Up for Fatigue and Depression Patient endorsing improvement in her symptoms of fatigue and depression but overall still feels "foggy" and "blurry". She has been eating more since our last appointment and says she has been drinking Boost shakes 2 times per day along with eating regular meals. She states her relationship with her daughter has improved. Overall, still feels very weak and is not sleeping well at night. No recent falls and is still working with PT at home which is helping her gait and balance. Not having any side effects from Sertraline. PHQ-9 score today of 22 improved from 30 last visit. No suicidal ideation or harmful intention to herself or others today.  ROS noted in HPI.   Past Medical, Surgical, Social, and Family History Reviewed & Updated per EMR.   Pertinent Historical Findings include:   Social History   Tobacco Use  Smoking Status Never Smoker  Smokeless Tobacco Never Used    Objective: BP 98/62   Pulse (!) 102   Wt 113 lb 6.4 oz (51.4 kg)   SpO2 97%   BMI 22.90 kg/m  Vitals and nursing notes reviewed  Physical Exam Gen: Alert and Oriented x 3, NAD HEENT: Normocephalic, atraumatic, PERRLA, EOMI, TM visible with good light reflex Neck: trachea midline, no thyroidmegaly, no LAD CV: RRR, no murmurs, normal S1, S2 split Resp: CTAB, no wheezing, rales, or rhonchi, comfortable work of breathing MSK: normal gait, walks with cane Ext: no clubbing, cyanosis, or edema Skin: warm, dry, intact, no rashes  Assessment/Plan:  Fatigue Improving depression which is helping her fatigue. I think it is reasonable at this point to get her plugged into the Geriatric clinic as she has shown she is overwhelmed when having to manage her own healthcare. If her symptoms continue to improve with Sertraline and  better diet will defer further workup. - [facility] to provide assistance with at home medication adherence - Cont Boost x 2 per day along with 3 meals - Cont treating depression; Sertraline 50mg  daily   PATIENT EDUCATION PROVIDED: See AVS    Diagnosis and plan along with any newly prescribed medication(s) were discussed in detail with this patient today. The patient verbalized understanding and agreed with the plan. Patient advised if symptoms worsen return to clinic or ER.    Orders Placed This Encounter  Procedures  . Ambulatory referral to Physical Therapy    Referral Priority:   Routine    Referral Type:   Physical Medicine    Referral Reason:   Specialty Services Required    Requested Specialty:   Physical Therapy    Number of Visits Requested:   1    Meds ordered this encounter  Medications  . sertraline (ZOLOFT) 50 MG tablet    Sig: Take 1 tablet (50 mg total) by mouth daily.    Dispense:  30 tablet    Refill:  [place], DO 08/15/2018, 2:02 PM PGY-3 [place]

## 2018-08-15 NOTE — Patient Instructions (Addendum)
It was great to see you today! Thank you for letting me participate in your care!  Today, we discussed your current medications and your depression. I am glad you are doing better and the sertraline seems to be helping you. Please continue to take 50mg  (one tablet) per day. I have refilled your Tramadol for your left shoulder pain. Please only take it as needed. I have also prescribed you some physical therapy for your left shoulder.  I will see you in two months and don't forget to bring your paperwork from your other doctors and don't forget to bring your medications with you.   I am also scheduling you to be seen by Dr. McDiarmid in the geriatric clinic.  Be well, Harolyn Rutherford, DO PGY-3, [facility]

## 2018-08-16 ENCOUNTER — Other Ambulatory Visit: Payer: Self-pay

## 2018-08-16 NOTE — Patient Outreach (Signed)
[facility]) Care Management  08/16/2018  Errin A Muhammed 1940-11-13 [id number]  Called member at preferred number and member answered phone. Introduced self and explained reason for the call. HIPPA identifiers verified.   Member states she visited the ED due to losing weight/PUD. Member states she has been undergoing testing to determine cause of weight loss. Member states she has been drinking boost supplements 1 x day.  Medications: Member states she has started new Zoloft medication and states she feels better taking this medication. Member taking new Tramadol medication. RN CM made member aware that [facility] Pharmacist has attempted to contact her for medication assistance with her bladder medication. Member stated she has received multiple calls from telemarketers and she did not answer. Member requested [facility] pharmacist's phone number and phone number provided for Denyse Amass, [facility] Pharmacist.   Appointments: PCP visit yesterday and member states she is following up with her Cardiologist.  Transportation: Member denies need for assistance and states she is able to drive herself.  Fatigue:  Member will receive iron transfusion on 09-10-2018. Member states she is using her cane for ambulation.  Member states she did not receive [facility] packet yet. Sent request again to [place], [facility] adm. Assistant to resend welcome packet with consents, calendar, and magnet to member.   Discussed with member that RN CM will transition to new position and referral will be sent to [place] and member verbalized understanding and appreciation for care received.   Plan: Will send discipline closure letter to PCP. Will close case and will make Denyse Amass, [facility] RPH and Susy Frizzle, CPhT aware as both listed on care team. [facility] Pharmacists also made aware of Provider's request to assist member with medication management. Will send Health Coach referral to follow up for fatigue.   [name],  RN-BSN  [facility] Care Management  Community Care Management Coordinator  [phone] [email or web address]

## 2018-08-17 ENCOUNTER — Other Ambulatory Visit: Payer: Self-pay | Admitting: Family Medicine

## 2018-08-17 ENCOUNTER — Ambulatory Visit (HOSPITAL_COMMUNITY)
Admission: RE | Admit: 2018-08-17 | Discharge: 2018-08-17 | Disposition: A | Discharge status: Home / Self Care | Payer: Medicare Other | Source: Ambulatory Visit | Attending: Gastroenterology | Admitting: Gastroenterology

## 2018-08-17 ENCOUNTER — Encounter: Payer: Self-pay | Admitting: Cardiology

## 2018-08-17 ENCOUNTER — Other Ambulatory Visit: Payer: Self-pay

## 2018-08-17 ENCOUNTER — Encounter: Payer: Self-pay | Admitting: Pharmacist

## 2018-08-17 ENCOUNTER — Ambulatory Visit (INDEPENDENT_AMBULATORY_CARE_PROVIDER_SITE_OTHER): Payer: Medicare Other | Admitting: Cardiology

## 2018-08-17 VITALS — BP 142/86 | HR 35 | Ht 59.0 in | Wt 116.0 lb

## 2018-08-17 DIAGNOSIS — I10 Essential (primary) hypertension: Secondary | ICD-10-CM | POA: Diagnosis not present

## 2018-08-17 DIAGNOSIS — I4821 Permanent atrial fibrillation: Secondary | ICD-10-CM

## 2018-08-17 DIAGNOSIS — R928 Other abnormal and inconclusive findings on diagnostic imaging of breast: Secondary | ICD-10-CM

## 2018-08-17 DIAGNOSIS — D500 Iron deficiency anemia secondary to blood loss (chronic): Secondary | ICD-10-CM | POA: Insufficient documentation

## 2018-08-17 MED ORDER — SODIUM CHLORIDE 0.9 % IV SOLN
510.0000 mg | Freq: Once | INTRAVENOUS | Status: AC
  Administered 2018-08-17: 510 mg via INTRAVENOUS
  Filled 2018-08-17: qty 17

## 2018-08-17 NOTE — Addendum Note (Signed)
Addended by: Nigel Mormon on: 08/17/2018 04:58 PM   Modules accepted: Level of Service

## 2018-08-17 NOTE — Progress Notes (Signed)
Patient referred by Nuala Alpha, DO for atrial fibrillation  Subjective:   Deanna Spencer, female    DOB: Mar 05, 1940, 78 y.o.   MRN: [id number]   Chief Complaint  Patient presents with  . Atrial Fibrillation    HPI  78 y.o. Caucasian female with complex medical history including permanent atrial fibrillation, s/p Watchman LAAC device, h/o recurrent GI bleeding with ulcers following gastric bypass surgery, h/o multilple blood transfusions, exploratory laporotomy with revision of gastrojejunostomy, esophagogastroscopy (11/2016), rheumatoid arthritis, multiple orthopedic issues.   Patient has previously been seen by [facility] heartcare and Dr. Doylene Canard. She is now here to establish cardiac care with [place].   Patient was born in [place], and moved to [place]. [place] after marriage where she lived most of her life. She now lives in [place] to be close to her daughter. She tells me that she had a heart attakc in 2017 while in [place], and was found to have Afib, underwent Watchman placement due to her inability to be on anticoagulation, and also had a Medtronic pacemaker placement. I do not have prior medical records of these hospitalizations in [place] (Not available in [place]) She has had multiple hospitalizations here with GI bleeding. Echocardiogram in 11/2016 showed EF 30-35%, with no ischemia on stress test in 01/2017.  She reports "flutter sensation" in her chest when she walks, but denies chest pain, shortness of breath, leg edema, orthopnea, PND, TIA/syncope. She denies any presyncopal or syncopal symptoms.  Past Medical History:  Diagnosis Date  . AAA (abdominal aortic aneurysm) ([place])    3.5cm by CT 11/29/16  . AAA (abdominal aortic aneurysm) without rupture ([place]) 12/21/2016   Stable 3cm 4/20, needs f/u imaging ~4/23  . Acute abdominal pain 05/05/2018  . Anemia   . Atrial fibrillation ([place]) 11/22/2016  . Bee sting 06/28/2017  . Cerumen impaction 06/28/2017  . Childhood  asthma    connected to allergies  . Chronic lower back pain   . Chronic systolic heart failure ([place]) 12/14/2016  . Diplopia 09/27/2017  . Diverticulosis   . Duodenal ulcer   . Dyspnea   . Dysrhythmia    a fib  . GERD (gastroesophageal reflux disease)   . GI bleed    "have had both upper and lower GIB"  . GI bleed 05/07/2018  . Gout   . Heart attack ([place]) 2017  . Heart murmur   . History of anastomosis and bypass hemorrhage 05/06/2018  . History of blood transfusion 2017-2018   "related to bleeding ulcers; 47 units PRBC; 2U plasma" (11/23/2016)  . History of duodenal ulcer 11/18/2016  . History of heart attack 11/18/2016  . History of hiatal hernia   . Hx of blood clots    in heart  . IBS (irritable bowel syndrome)   . Liver lesion 08/07/2017  . Neuropathy   . Osteoarthritis   . Osteoarthritis of right knee 05/30/2017  . Osteoporosis   . Other chronic pain 11/18/2016  . Parasomnia 11/18/2016  . Perforated viscus 11/22/2016  . Peripheral neuropathy   . 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 Presence of Watchman left atrial appendage closure device   . Primary osteoarthritis of left hip 02/24/2017  . 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 Primary osteoarthritis of right knee 06/02/2017  . Raynaud disease   . Rheumatoid arteritis ([place])   . Rheumatoid arthritis involving both hands ([place]) 09/27/2017  . S/P exploratory laparotomy 11/22/2016  .  Seasonal allergies 08/07/2017  . 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 SSS (sick sinus syndrome) ([place])    a. s/p Medtronic PPM placement in 2017  . Stomach ulcer   . Vision changes 08/07/2017     Past Surgical History:  Procedure Laterality Date  . ABDOMINAL HYSTERECTOMY    . APPENDECTOMY    . ARTHROPLASTY Right 06/02/2017   RIGHT PATELLA-FEMORAL ARTHROPLASTY, with placement of a cemented 29 mm Stryker asymmetric triathlon patellar button, revision of the tibial polyethylene bearing.  Marland Kitchen BIOPSY   05/06/2018   Procedure: BIOPSY;  Surgeon: Gatha Mayer, MD;  Location: [place];  Service: Endoscopy;;  . CARPAL TUNNEL RELEASE Bilateral   . CATARACT EXTRACTION W/ INTRAOCULAR LENS  IMPLANT, BILATERAL Bilateral   . CHOLECYSTECTOMY OPEN    . DILATION AND CURETTAGE OF UTERUS    . ESOPHAGOGASTRODUODENOSCOPY (EGD) WITH PROPOFOL N/A 05/06/2018   Procedure: ESOPHAGOGASTRODUODENOSCOPY (EGD) WITH PROPOFOL;  Surgeon: Gatha Mayer, MD;  Location: [facility];  Service: Endoscopy;  Laterality: N/A;  . FOOT FUSION Right   . INGUINAL HERNIA REPAIR Right X 2  . INSERT / REPLACE / REMOVE PACEMAKER  2017   medtronic  . JOINT REPLACEMENT    . LAPAROTOMY N/A 11/21/2016   Procedure: EXPLORATORY LAPAROTOMY, REVISION OF  GASTROJEJUNOSTOMY, ESOPHAGOGASTROSCOPY;  Surgeon: Clovis Riley, MD;  Location: [place];  Service: General;  Laterality: N/A;  . LEFT ATRIAL APPENDAGE OCCLUSION  2018   placed  via groin  . LUMBAR FUSION  X 2  . PATELLA-FEMORAL ARTHROPLASTY Right 06/02/2017   Procedure: RIGHT PATELLA-FEMORAL ARTHROPLASTY;  Surgeon: Frederik Pear, MD;  Location: [place];  Service: Orthopedics;  Laterality: Right;  . REVERSE SHOULDER ARTHROPLASTY Left    reverse shoulder replacement  . ROUX-EN-Y GASTRIC BYPASS  2005  . ROUX-EN-Y PROCEDURE  20017 X 2   "revisions"  . TONSILLECTOMY AND ADENOIDECTOMY    . TOTAL HIP ARTHROPLASTY Right 02/24/2017   Procedure: TOTAL HIP ARTHROPLASTY;  Surgeon: Earlie Server, MD;  Location: [facility];  Service: Orthopedics;  Laterality: Right;  . TOTAL KNEE ARTHROPLASTY Bilateral   . TOTAL SHOULDER ARTHROPLASTY Bilateral   . TUBAL LIGATION       Social History   Socioeconomic History  . Marital status: Widowed    Spouse name: Not on file  . Number of children: Not on file  . Years of education: Not on file  . Highest education level: Not on file  Occupational History  . Not on file  Social Needs  . Financial resource strain: Somewhat hard  . Food insecurity     Worry: Never true    Inability: Never true  . Transportation needs    Medical: No    Non-medical: No  Tobacco Use  . Smoking status: Never Smoker  . Smokeless tobacco: Never Used  Substance and Sexual Activity  . Alcohol use: No    Frequency: Never    Comment: very rare  . Drug use: No  . Sexual activity: Not Currently  Lifestyle  . Physical activity    Days per week: 2 days    Minutes per session: 20 min  . Stress: Not on file  Relationships  . Social connections    Talks on phone: More than three times a week    Gets together: More than three times a week    Attends religious service: Not on file    Active member of club or organization: Yes    Attends meetings of clubs or organizations: More than  4 times per year    Relationship status: Widowed  . Intimate partner violence    Fear of current or ex partner: No    Emotionally abused: No    Physically abused: No    Forced sexual activity: No  Other Topics Concern  . Not on file  Social History Narrative  . Not on file     Family History  Problem Relation Age of Onset  . Heart disease Mother   . Liver disease Father   . Ulcers Father   . Prostate cancer Brother   . Colon cancer Neg Hx      Current Outpatient Medications on File Prior to Visit  Medication Sig Dispense Refill  . acetaminophen (TYLENOL) 500 MG tablet Take 1,000 mg every 6 (six) hours as needed by mouth (pain).     . calcium citrate (CALCITRATE - DOSED IN MG ELEMENTAL CALCIUM) 950 MG tablet Take 2.5 tablets (500 mg of elemental calcium total) by mouth 3 (three) times daily. 200 tablet 0  . COLCRYS 0.6 MG tablet TAKE 1 TABLET (0.6 MG TOTAL) BY MOUTH 2 (TWO) TIMES DAILY AS NEEDED (GOUT ATTACKS). 30 tablet 0  . digoxin (LANOXIN) 0.125 MG tablet TAKE 1/2 TABLET BY MOUTH DAILY 15 tablet 0  . famotidine (PEPCID) 40 MG tablet Take 1 tablet (40 mg total) by mouth at bedtime. 30 tablet 0  . gabapentin (NEURONTIN) 300 MG capsule TAKE 1 CAPSULEs(600 MG) BY  MOUTH THREE TIMES DAILY 360 capsule 6  . lactose free nutrition (BOOST PLUS) LIQD Take 237 mLs by mouth 3 (three) times daily with meals.  0  . metoprolol succinate (TOPROL-XL) 100 MG 24 hr tablet Take 1 tablet (100 mg total) by mouth at bedtime. Take with or immediately following a meal. 90 tablet 3  . multivitamin (PROSIGHT) TABS tablet Take 1 tablet by mouth 2 (two) times a day. 60 each 0  . ondansetron (ZOFRAN ODT) 4 MG disintegrating tablet Take 1 tablet (4 mg total) by mouth every 8 (eight) hours as needed for nausea or vomiting. 20 tablet 0  . pantoprazole (PROTONIX) 40 MG tablet Take 1 tablet (40 mg total) by mouth 2 (two) times a day. 60 tablet 2  . polyethylene glycol (MIRALAX / GLYCOLAX) 17 g packet Take 17 g by mouth daily. 14 each 0  . rOPINIRole (REQUIP) 0.5 MG tablet Take 1 tablet (0.5 mg total) by mouth 3 (three) times daily. 90 tablet 0  . sertraline (ZOLOFT) 50 MG tablet Take 1 tablet (50 mg total) by mouth daily. 30 tablet 3  . sucralfate (CARAFATE) 1 GM/10ML suspension Take 10 mLs (1 g total) by mouth every 6 (six) hours. 420 mL 0  . tolterodine (DETROL LA) 4 MG 24 hr capsule Take 1 capsule (4 mg total) by mouth daily. 30 capsule 2  . traMADol (ULTRAM) 50 MG tablet Take 1 tablet (50 mg total) by mouth every 8 (eight) hours as needed for up to 5 days. 15 tablet 0   No current facility-administered medications on file prior to visit.     Cardiovascular studies:  EKG 08/17/2018: Atrial fibrillation Ventricular paced rhythm  Nuclear stress test 02/03/2017:   The study is normal.  This is a low risk study.  The patient was in atrial fibrillation so gating was not possible. Therefore, we were not able to obtain any information regarding left ventricular function.     Echocardiogram 11/30/2016: - Left ventricle: Diffuse hypokinesis worse in the mid and basal   inferior  wall. The cavity size was mildly dilated. Wall thickness   was increased in a pattern of mild LVH.  Systolic function was   moderately to severely reduced. The estimated ejection fraction   was in the range of 30% to 35%. The study is not technically   sufficient to allow evaluation of LV diastolic function. - Mitral valve: Calcified annulus. There was moderate   regurgitation. - Left atrium: The atrium was moderately dilated. - Atrial septum: No defect or patent foramen ovale was identified.   Recent labs: Results for CARLEE, VONDERHAAR (MRN [id number]) as of 08/17/2018 11:36  Ref. Range 08/01/2018 15:36  COMPREHENSIVE METABOLIC PANEL Unknown Rpt (A)  Sodium Latest Ref Range: 134 - 144 mmol/L 144  Potassium Latest Ref Range: 3.5 - 5.2 mmol/L 3.6  Chloride Latest Ref Range: 96 - 106 mmol/L 104  CO2 Latest Ref Range: 20 - 29 mmol/L 24  Glucose Latest Ref Range: 65 - 99 mg/dL 93  BUN Latest Ref Range: 8 - 27 mg/dL 14  Creatinine Latest Ref Range: 0.57 - 1.00 mg/dL 0.68  Calcium Latest Ref Range: 8.7 - 10.3 mg/dL 9.0  BUN/Creatinine Ratio Latest Ref Range: 12 - 28  21  Alkaline Phosphatase Latest Ref Range: 39 - 117 IU/L 83  Albumin Latest Ref Range: 3.7 - 4.7 g/dL 3.5 (L)  Albumin/Globulin Ratio Latest Ref Range: 1.2 - 2.2  1.3  AST Latest Ref Range: 0 - 40 IU/L 17  ALT Latest Ref Range: 0 - 32 IU/L 7  Total Protein Latest Ref Range: 6.0 - 8.5 g/dL 6.2  Total Bilirubin Latest Ref Range: 0.0 - 1.2 mg/dL <0.2  GFR, Est Non African American Latest Ref Range: >59 mL/min/1.73 85  GFR, Est African American Latest Ref Range: >59 mL/min/1.73 98   Results for LEESHA, VENO (MRN [id number]) as of 08/17/2018 11:36  Ref. Range 08/01/2018 15:36  WBC Latest Ref Range: 3.4 - 10.8 x10E3/uL 8.6  RBC Latest Ref Range: 3.77 - 5.28 x10E6/uL 4.16  Hemoglobin Latest Ref Range: 11.1 - 15.9 g/dL 11.9  HCT Latest Ref Range: 34.0 - 46.6 % 38.2  MCV Latest Ref Range: 79 - 97 fL 92  MCH Latest Ref Range: 26.6 - 33.0 pg 28.6  MCHC Latest Ref Range: 31.5 - 35.7 g/dL 31.2 (L)  RDW Latest Ref Range: 11.7 - 15.4 %  14.4  Platelets Latest Ref Range: 150 - 450 x10E3/uL 457 (H)   Results for EIMAN, MARET (MRN [id number]) as of 08/17/2018 11:36  Ref. Range 08/01/2018 15:36  Glucose Latest Ref Range: 65 - 99 mg/dL 93  TSH Latest Ref Range: 0.450 - 4.500 uIU/mL 0.956  T4,Free(Direct) Latest Ref Range: 0.82 - 1.77 ng/dL 1.44     Review of Systems  Constitution: Negative for decreased appetite, malaise/fatigue, weight gain and weight loss.  HENT: Negative for congestion.   Eyes: Negative for visual disturbance.  Cardiovascular: Positive for palpitations. Negative for chest pain, dyspnea on exertion, leg swelling and syncope.  Respiratory: Negative for cough.   Endocrine: Negative for cold intolerance.  Hematologic/Lymphatic: Does not bruise/bleed easily.  Skin: Negative for itching and rash.  Musculoskeletal: Positive for arthritis and joint pain. Negative for myalgias.  Gastrointestinal: Negative for abdominal pain, nausea and vomiting.  Genitourinary: Negative for dysuria.  Neurological: Negative for dizziness and weakness.  Psychiatric/Behavioral: The patient is not nervous/anxious.   All other systems reviewed and are negative.       Vitals:   08/17/18 1346  BP: (!) 142/86  Pulse: Marland Kitchen)  35     Body mass index is 23.43 kg/m. Filed Weights   08/17/18 1346  Weight: 116 lb (52.6 kg)     Objective:   Physical Exam  Constitutional: She is oriented to person, place, and time. She appears well-developed and well-nourished. No distress.  HENT:  Head: Normocephalic and atraumatic.  Eyes: Pupils are equal, round, and reactive to light. Conjunctivae are normal.  Neck: No JVD present.  Cardiovascular: Normal rate and intact distal pulses. An irregularly irregular rhythm present.  Murmur heard. High-pitched blowing holosystolic murmur is present with a grade of 3/6 at the apex. Pulmonary/Chest: Effort normal and breath sounds normal. She has no wheezes. She has no rales.  Abdominal: Soft.  Bowel sounds are normal. There is no rebound.  Musculoskeletal:        General: No edema.  Lymphadenopathy:    She has no cervical adenopathy.  Neurological: She is alert and oriented to person, place, and time. No cranial nerve deficit.  Skin: Skin is warm and dry.  Psychiatric: She has a normal mood and affect.  Nursing note and vitals reviewed.         Assessment & Recommendations:   78 y.o. Caucasian female with complex medical history including permanent atrial fibrillation, s/p Watchman LAAC device, h/o recurrent GI bleeding with ulcers following gastric bypass surgery, h/o multilple blood transfusions, exploratory laporotomy with revision of gastrojejunostomy, esophagogastroscopy (11/2016), rheumatoid arthritis, multiple orthopedic issues.   Atrial fibrillation: Permanent. Not a candidate for anticoagulation due to life threatening GI bleeding history. S/p LAA closure 2017. S/p pacemaker placement (2017), likely for sinus node dysfunction.  Echocardiogram in 11/2016 showed EF 30-35%. Clinically, she is not in heart failure. I will repeat echocardiogram. Given that she is in Afib even on digoxin, I have stopped her digoxin. In future, If needed, metoprolol dose can be increased.    Thank you for referring the patient to [place]. Please feel free to contact with any questions.  Nigel Mormon, MD [facility] Pager: [phone] Office: [phone] If no answer Cell [phone]

## 2018-08-17 NOTE — Discharge Instructions (Signed)

## 2018-08-17 NOTE — Telephone Encounter (Signed)
This encounter was created in error - please disregard.

## 2018-08-20 ENCOUNTER — Other Ambulatory Visit: Payer: Self-pay | Admitting: Cardiology

## 2018-08-20 DIAGNOSIS — I429 Cardiomyopathy, unspecified: Secondary | ICD-10-CM

## 2018-08-20 DIAGNOSIS — I10 Essential (primary) hypertension: Secondary | ICD-10-CM

## 2018-08-20 DIAGNOSIS — I4821 Permanent atrial fibrillation: Secondary | ICD-10-CM

## 2018-08-21 ENCOUNTER — Other Ambulatory Visit: Payer: Self-pay | Admitting: *Deleted

## 2018-08-21 ENCOUNTER — Other Ambulatory Visit: Payer: Self-pay

## 2018-08-21 ENCOUNTER — Telehealth: Payer: Self-pay | Admitting: Pharmacist

## 2018-08-21 MED ORDER — FAMOTIDINE 40 MG PO TABS: 40.0000 mg | ORAL_TABLET | Freq: Every day | ORAL | 0 refills | Status: DC

## 2018-08-21 MED ORDER — SUCRALFATE 1 GM/10ML PO SUSP: 1.0000 g | Freq: Four times a day (QID) | ORAL | 3 refills | Status: DC

## 2018-08-21 NOTE — Patient Outreach (Signed)
[facility]) Care Management  [place]   08/21/2018  Deanna Spencer 1940-11-27 [id number]  Reason for referral: Medication Adherence  Referral source: Dr. Garlan Fillers Current insurance: Medicare  PMHx includes but not limited to:   AAA, Abdominal pain, Anemia, Atrial fibrillation, sp pacemaker placement, CHF,  GERD, Gastrojejunal ulcer, osteoarthritis of right new, peripheral neuropathy, seasonal allergies, rheumatoid arthritis.  Outreach:  Successful telephone call with patient.  HIPAA  identifiers verified.   Subjective:  Patient is a 78 year old female with the above mentioned medical conditions.  [facility] has been working with her on medication assistance. Her PCP sent over a new request for [facility] Pharmacist to work with the patient on medication management and to explore getting her medications in pill packing.  Patient expressed concern about having to pay >$40 for Toviaz (Tolterodine).  Does the patient ever forget to take medication?  yes Does the patient have problems obtaining medications due to transportation?   no Does the patient have problems obtaining medications due to cost?  yes  Does the patient feel that medications prescribed are effective?  yes Does the patient ever experience any side effects to the medications prescribed?  no   Objective: The ASCVD Risk score Mikey Bussing DC Jr., et al., 2013) failed to calculate for the following reasons:   The patient has a prior MI or stroke diagnosis  Lab Results  Component Value Date   CREATININE 0.68 08/01/2018   CREATININE 0.79 07/15/2018   CREATININE 0.90 05/12/2018    No results found for: HGBA1C  Lipid Panel     Component Value Date/Time   CHOL 114 05/05/2018 1845   TRIG 123 05/05/2018 1845   HDL 31 (L) 05/05/2018 1845   CHOLHDL 3.7 05/05/2018 1845   VLDL 25 05/05/2018 1845   LDLCALC 58 05/05/2018 1845    BP Readings from Last 3 Encounters:  08/17/18 134/74  08/17/18 (!) 142/86  08/15/18  98/62    Allergies  Allergen Reactions  . Demerol [Meperidine] Anaphylaxis  . Nsaids     Patient has had MULTIPLE revisions of her gastrojejunostomy for marginal ulcers including recent perforation    Medications Reviewed Today    Reviewed by Elayne Guerin, [facility] (Pharmacist) on 08/21/18 at 1215  Med List Status: <None>  Medication Order Taking? Sig Documenting Provider Last Dose Status Informant  acetaminophen (TYLENOL) 500 MG tablet [id number] Yes Take 1,000 mg every 6 (six) hours as needed by mouth (pain).  [provider] Taking Active Self  calcium citrate (CALCITRATE - DOSED IN MG ELEMENTAL CALCIUM) 950 MG tablet [id number] Yes Take 2.5 tablets (500 mg of elemental calcium total) by mouth 3 (three) times daily. Patriciaann Clan, DO Taking Active   COLCRYS 0.6 MG tablet [id number] Yes TAKE 1 TABLET (0.6 MG TOTAL) BY MOUTH 2 (TWO) TIMES DAILY AS NEEDED (GOUT ATTACKS). Nuala Alpha, DO Taking Active   famotidine (PEPCID) 40 MG tablet [id number] Yes Take 1 tablet (40 mg total) by mouth at bedtime. Nuala Alpha, DO Taking Active   gabapentin (NEURONTIN) 300 MG capsule [id number] Yes TAKE 1 CAPSULEs(600 MG) BY MOUTH THREE TIMES DAILY Nuala Alpha, DO Taking Active   lactose free nutrition (BOOST PLUS) LIQD [id number] Yes Take 237 mLs by mouth 3 (three) times daily with meals. Patriciaann Clan, DO Taking Active   metoprolol succinate (TOPROL-XL) 100 MG 24 hr tablet [id number] Yes Take 1 tablet (100 mg total) by mouth at bedtime. Take with or immediately following a meal. Lockamy,  Christia Reading, DO Taking Active   multivitamin (PROSIGHT) TABS tablet [id number] Yes Take 1 tablet by mouth 2 (two) times a day. Patriciaann Clan, DO Taking Active   ondansetron (ZOFRAN ODT) 4 MG disintegrating tablet [id number] Yes Take 1 tablet (4 mg total) by mouth every 8 (eight) hours as needed for nausea or vomiting. Gareth Morgan, MD Taking Active   pantoprazole (PROTONIX) 40 MG tablet [id number] Yes  Take 1 tablet (40 mg total) by mouth 2 (two) times a day. Nuala Alpha, DO Taking Active   polyethylene glycol (MIRALAX / GLYCOLAX) 17 g packet [id number] Yes Take 17 g by mouth daily. Patriciaann Clan, DO Taking Active            Med Note (CHAMBERS, ELIZABETH A   Tue Jul 03, 2018 12:53 PM) Takes when needed  rOPINIRole (REQUIP) 0.5 MG tablet [id number] Yes Take 1 tablet (0.5 mg total) by mouth 3 (three) times daily. Nuala Alpha, DO Taking Active   sertraline (ZOLOFT) 50 MG tablet [id number] Yes Take 1 tablet (50 mg total) by mouth daily. Nuala Alpha, DO Taking Active   sucralfate (CARAFATE) 1 GM/10ML suspension [id number] Yes Take 10 mLs (1 g total) by mouth every 6 (six) hours. Nuala Alpha, DO Taking Active            Med Note Valli Glance Aug 21, 2018 12:15 PM) Patient uses Sucralfate 1 GM tablets. She mixes with water and makes a slurry.  tolterodine (DETROL LA) 4 MG 24 hr capsule [id number] Yes Take 1 capsule (4 mg total) by mouth daily. Nuala Alpha, DO Taking Active           Assessment: Drugs sorted by system:  Neurologic/Psychologic: Sertraline, Ropinirole  Cardiovascular:  Metoprolol Succinate,   Gastrointestinal: Ondansetron, Famotidine, Pantoprazole, Polyethylene Glycol, Boost  Pain: Acetaminophen  Vitamins/Minerals/Supplements: Multiple Vitamin, Calcium Citrate,    Medication Adherence Findings:  Patient with fair understanding of regimen and fair understanding of indications.    Patient's PCP recommended Pill Packs. Patient was agreeable but wanted to stick with the pharmacy she has been using.  [facility] [place] [facility]      [facility] and the patient was set up to receive her chronic medications delivered to her local pharmacy for pick up in pill packs. (With the exception of Gabapentin because it is a controlled substance in [place] where the  [facility] physically [facility]).  Tolterodine will also not go in the pack per the patient request (due to the copayment amount)  The following medications will be dispensed in [facility]:  Sertraline Metoprolol Succinate Ropinirole Famotidine Calcium Citrate Pantoprazole  PCP WILL BE ASKED TO SEND IN NEW PRESCRIPTIONS FOR ALL OF THE PATIENT'S MEDICATIONS AND INCLUDE THE INDICATION IN THE SIG.  Specific needs for the new prescriptions that are not on the med list:  Sucralfate 1G TABLETS- patient's chart lists Sucralfate 1gm suspension. Patient said it was too expensive so she went back to taking Sucralfate tablets that she received after a hospitalization.  (She makes a slurry with the tablets (mixes them with water) and takes the slurry four times daily.  (Script for tablets vs suspension is needed)  Pantoprazole-patient takes twice daily.  Prescription should specifically state 1 tablet in the morning and 1 tablet at bedtime FOR ____(the indication)  Ropinirole-patient reported taking 1 tablet at 5pm and 2 tablets at bedtime.  Script should state:  Take 1 tablet in the evening and 2 tablets at bedtime. (Currently says three times daily)         Medication Assistance Findings:  Medication assistance needs identified: [name]  -patient has been sent [employer] application three times---most recently 08/14/2018.  Extra Help:  Not eligible for Extra Help Low Income Subsidy based on reported income and assets    Plan: . Will route note to PCP.  Marland Kitchen Will follow-up in 4 business days.   Elayne Guerin, PharmD, [place] Clinical Pharmacist [phone]

## 2018-08-21 NOTE — Assessment & Plan Note (Signed)
Improving depression which is helping her fatigue. I think it is reasonable at this point to get her plugged into the Geriatric clinic as she has shown she is overwhelmed when having to manage her own healthcare. If her symptoms continue to improve with Sertraline and better diet will defer further workup. - [facility] to provide assistance with at home medication adherence - Cont Boost x 2 per day along with 3 meals - Cont treating depression; Sertraline 50mg  daily

## 2018-08-22 ENCOUNTER — Other Ambulatory Visit: Payer: Self-pay | Admitting: Pharmacy Technician

## 2018-08-22 MED ORDER — CALCIUM CITRATE 950 (200 CA) MG PO TABS: 500.0000 mg | ORAL_TABLET | Freq: Three times a day (TID) | ORAL | 0 refills | Status: DC

## 2018-08-22 MED ORDER — PANTOPRAZOLE SODIUM 40 MG PO TBEC: 40.0000 mg | DELAYED_RELEASE_TABLET | Freq: Two times a day (BID) | ORAL | 2 refills | Status: DC

## 2018-08-22 MED ORDER — GABAPENTIN 300 MG PO CAPS: ORAL_CAPSULE | ORAL | 6 refills | Status: DC

## 2018-08-22 MED ORDER — SERTRALINE HCL 50 MG PO TABS: 50.0000 mg | ORAL_TABLET | Freq: Every day | ORAL | 3 refills | Status: DC

## 2018-08-22 NOTE — Patient Outreach (Signed)
[facility]) Care Management  08/22/2018  Deanna Spencer 06-May-1940 [id number]   Successful outreach call placed to patient in regards to [employer] application for [place].  Spoke to patient, HIPAA identifiers verified.  Patient informed she has not received the application yet. She informed when she first moved up here from [place], she was in a different apartment number than she is now and maybe that is why she has not received the application.  Verified with patient that the address the applications were mailed to were correct.  Informed patient I would outreach her on Tuesday 08/28/2018 as that will be 2 weeks since the 3rd set of applications were mailed to her to inquire if she has received them. Patient was agreeable.  Will outreach patient in 3-5 business days to inquire if application was received.  Karess Harner P. Rogerio Boutelle, [place] Management [phone]

## 2018-08-23 ENCOUNTER — Telehealth: Payer: Self-pay | Admitting: *Deleted

## 2018-08-23 ENCOUNTER — Other Ambulatory Visit: Payer: Self-pay

## 2018-08-23 DIAGNOSIS — I2510 Atherosclerotic heart disease of native coronary artery without angina pectoris: Secondary | ICD-10-CM | POA: Diagnosis not present

## 2018-08-23 DIAGNOSIS — Z9884 Bariatric surgery status: Secondary | ICD-10-CM | POA: Diagnosis not present

## 2018-08-23 DIAGNOSIS — D649 Anemia, unspecified: Secondary | ICD-10-CM | POA: Diagnosis not present

## 2018-08-23 DIAGNOSIS — I4891 Unspecified atrial fibrillation: Secondary | ICD-10-CM | POA: Diagnosis not present

## 2018-08-23 DIAGNOSIS — I5022 Chronic systolic (congestive) heart failure: Secondary | ICD-10-CM | POA: Diagnosis not present

## 2018-08-23 DIAGNOSIS — K289 Gastrojejunal ulcer, unspecified as acute or chronic, without hemorrhage or perforation: Secondary | ICD-10-CM | POA: Diagnosis not present

## 2018-08-23 NOTE — Telephone Encounter (Signed)
Discrepancy in instructions.  Please review and resent with correct instructions.  gabapentin (NEURONTIN) 300 MG capsule       Sig: TAKE 1 CAPSULEs(600 MG) BY MOUTH THREE TIMES DAILY    Is it one capsule (300mg ) TID or 2 capsuls (200mg ) TID  Christen Bame, CMA

## 2018-08-23 NOTE — Patient Outreach (Signed)
[facility]) Care Management  08/23/2018  Elidia A Lambright September 05, 1940 [id number]   New referral placed to [place] requesting to follow up with member due to HTN. BP 142/86 on 08-17-2018.   [name], RN-BSN  [facility] Care Management  Community Care Management Coordinator  [phone] [email or web address]

## 2018-08-24 ENCOUNTER — Ambulatory Visit: Payer: Self-pay | Admitting: Pharmacist

## 2018-08-27 ENCOUNTER — Other Ambulatory Visit: Payer: Self-pay | Admitting: Pharmacist

## 2018-08-27 NOTE — Patient Outreach (Addendum)
[facility]) Care Management  08/27/2018  Deanna Spencer July 05, 1940 [id number]   Patient was called to follow up on pill packs [facility]. HIPAA identifiers were obtained.  From chart review, patient's [facility]. The Technician said he would transfer the prescriptions over from the local [facility]. However, they still needed new prescriptions for: Famotidine, Calcium Citrate and Sucralfate 1 gm tablets.  Dr. Garlan Fillers was sent a priority message about sending the required prescriptions.  Patient also wondered about any community resources to help with dental care. A social work referral will be placed.  Plan: Follow up in 2 business days to ensure proper delivery of pill packs.   Elayne Guerin, PharmD, [place] Clinical Pharmacist [phone]

## 2018-08-28 ENCOUNTER — Other Ambulatory Visit: Payer: Self-pay

## 2018-08-28 ENCOUNTER — Other Ambulatory Visit: Payer: Self-pay | Admitting: Pharmacy Technician

## 2018-08-28 NOTE — Patient Outreach (Signed)
[facility] [facility]) Care Management  08/28/2018  Zalea A Nadal Oct 14, 1940 [id number]   Social work referral received from [place], Denyse Amass.  "Patient interested in any community resources to help with dental and eye care. She has several broken teeth and does not have dental coverage as part of her Medicare." Successful outreach to patient today. Regarding dental resources, patient was encouraged to contact [facility] and was provided with contact information.  Patient reports that she is in need of eye surgery but is unable to afford out-of-pocket expense. BSW received a very similar referral several weeks ago; many resources were explored and none were able to provide this type of financial assistance.   Asked patient if she inquired with provider about their willingness to set her up on a payment plan.  Patient did not inquire about this but said that she will do so.  BSW closing case but encouraged her to call if additional questions arise.     Ronn Melena, BSW Social Worker [phone]

## 2018-08-28 NOTE — Patient Outreach (Signed)
[facility]) Care Management  08/28/2018  Dayanira A Yera 1940/12/29 [id number]   Unsuccessful outreach call placed to patient in regareds to [employer] application for [employer].  This was my 2nd call.  Unfortunately patient did not answer the phone, HIPAA compliant voicemail left.  Was calling patient to inquire if she had received the 3rd set of applications that we mailed to her.  Will followup with 3rd outreach attempt in 5-10 business days.  Keen Ewalt P. Jaysha Lasure, [place] Management [phone]

## 2018-08-29 ENCOUNTER — Other Ambulatory Visit: Payer: Self-pay

## 2018-08-29 ENCOUNTER — Other Ambulatory Visit: Payer: Self-pay | Admitting: Pharmacist

## 2018-08-29 ENCOUNTER — Other Ambulatory Visit: Payer: Self-pay | Admitting: *Deleted

## 2018-08-29 MED ORDER — FAMOTIDINE 40 MG PO TABS: 40.0000 mg | ORAL_TABLET | Freq: Every day | ORAL | 3 refills | Status: AC

## 2018-08-29 MED ORDER — CALCIUM CITRATE 200 MG PO TABS: 500.0000 mg | ORAL_TABLET | Freq: Every day | ORAL | 3 refills | Status: DC

## 2018-08-29 MED ORDER — SUCRALFATE 1 G PO TABS: 1.0000 g | ORAL_TABLET | Freq: Four times a day (QID) | ORAL | 3 refills | Status: AC

## 2018-08-29 NOTE — Patient Outreach (Signed)
[place] [facility]) Care Management  08/29/2018  Deanna Spencer [birth year] [id number]   Contacted patient today to provide additional potential resources for needed dental work.   Provided her with contact information for the following: [facility] [place], [place] Worker [phone]

## 2018-08-29 NOTE — Patient Outreach (Signed)
[facility] [facility]) Care Management  08/29/2018  Makendra A Noe 1940-03-12 [id number]   [facility] a [facility]icemail for me to clarify the patient's medications for her upcoming pill pack delivery. Patient was called on conference. HIPAA identifiers were obtained.  [facility] confirmed they still need new prescriptions for famotidine, Calcium citrate, and sucralfate 1 gm tablets.  A message was left on [facility]'s Triage Nurse line.   Patient was encouraged to call the office as well.   Patient confirmed receipt of the [place] application we mailed him on  08/14/2018 for [place].  She said she will complete it and send it back.  Plan: Follow up in 2-3 business days.   Elayne Guerin, PharmD, [place] Clinical Pharmacist [phone]

## 2018-08-30 ENCOUNTER — Encounter: Payer: Self-pay | Admitting: Physical Therapy

## 2018-08-30 ENCOUNTER — Ambulatory Visit: Payer: Medicare Other | Attending: Family Medicine | Admitting: Physical Therapy

## 2018-08-30 ENCOUNTER — Other Ambulatory Visit: Payer: Self-pay

## 2018-08-30 DIAGNOSIS — M25512 Pain in left shoulder: Secondary | ICD-10-CM | POA: Diagnosis not present

## 2018-08-30 DIAGNOSIS — M542 Cervicalgia: Secondary | ICD-10-CM

## 2018-08-30 DIAGNOSIS — M62838 Other muscle spasm: Secondary | ICD-10-CM

## 2018-08-30 DIAGNOSIS — G8929 Other chronic pain: Secondary | ICD-10-CM | POA: Insufficient documentation

## 2018-08-30 NOTE — Therapy (Signed)
[place], [place], [address] Phone: [phone]   Fax:  [phone]  Physical Therapy Evaluation  Patient Details  Name: Deanna Spencer MRN: [id number] Date of Birth: 01-19-1940 Referring Provider (PT): Dr Ruthann Cancer Chambliss/ Nuala Alpha DO    Encounter Date: 08/30/2018  PT End of Session - 08/30/18 1548    Visit Number  1    Number of Visits  12    Date for PT Re-Evaluation  10/11/18    Authorization Type  Medicare    PT Start Time  1102    PT Stop Time  1145    PT Time Calculation (min)  43 min    Activity Tolerance  Patient tolerated treatment well    Behavior During Therapy  [facility] for tasks assessed/performed       Past Medical History:  Diagnosis Date  . AAA (abdominal aortic aneurysm) ([place])    3.5cm by CT 11/29/16  . AAA (abdominal aortic aneurysm) without rupture ([place]) 12/21/2016   Stable 3cm 4/20, needs f/u imaging ~4/23  . Acute abdominal pain 05/05/2018  . Anemia   . Atrial fibrillation ([place]) 11/22/2016  . Bee sting 06/28/2017  . Cerumen impaction 06/28/2017  . Childhood asthma    connected to allergies  . Chronic lower back pain   . Chronic systolic heart failure ([place]) 12/14/2016  . Diplopia 09/27/2017  . Diverticulosis   . Duodenal ulcer   . Dyspnea   . Dysrhythmia    a fib  . GERD (gastroesophageal reflux disease)   . GI bleed    "have had both upper and lower GIB"  . GI bleed 05/07/2018  . Gout   . Heart attack ([place]) 2017  . Heart murmur   . History of anastomosis and bypass hemorrhage 05/06/2018  . History of blood transfusion 2017-2018   "related to bleeding ulcers; 47 units PRBC; 2U plasma" (11/23/2016)  . History of duodenal ulcer 11/18/2016  . History of heart attack 11/18/2016  . History of hiatal hernia   . Hx of blood clots    in heart  . IBS (irritable bowel syndrome)   . Liver lesion 08/07/2017  . Neuropathy   . Osteoarthritis   . Osteoarthritis of right knee  05/30/2017  . Osteoporosis   . Other chronic pain 11/18/2016  . Parasomnia 11/18/2016  . Perforated viscus 11/22/2016  . Peripheral neuropathy   . 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 Presence of Watchman left atrial appendage closure device   . Primary osteoarthritis of left hip 02/24/2017  . 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 Primary osteoarthritis of right knee 06/02/2017  . Raynaud disease   . Rheumatoid arteritis ([place])   . Rheumatoid arthritis involving both hands ([place]) 09/27/2017  . S/P exploratory laparotomy 11/22/2016  . Seasonal allergies 08/07/2017  . 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 SSS (sick sinus syndrome) ([place])    a. s/p Medtronic PPM placement in 2017  . Stomach ulcer   . Vision changes 08/07/2017    Past Surgical History:  Procedure Laterality Date  . ABDOMINAL HYSTERECTOMY    . APPENDECTOMY    . ARTHROPLASTY Right 06/02/2017   RIGHT PATELLA-FEMORAL ARTHROPLASTY, with placement of a cemented 29 mm Stryker asymmetric triathlon patellar button, revision of the tibial polyethylene bearing.  Marland Kitchen BIOPSY  05/06/2018   Procedure: BIOPSY;  Surgeon: Gatha Mayer, MD;  Location: [place];  Service: Endoscopy;;  . CARPAL TUNNEL  RELEASE Bilateral   . CATARACT EXTRACTION W/ INTRAOCULAR LENS  IMPLANT, BILATERAL Bilateral   . CHOLECYSTECTOMY OPEN    . DILATION AND CURETTAGE OF UTERUS    . ESOPHAGOGASTRODUODENOSCOPY (EGD) WITH PROPOFOL N/A 05/06/2018   Procedure: ESOPHAGOGASTRODUODENOSCOPY (EGD) WITH PROPOFOL;  Surgeon: Gatha Mayer, MD;  Location: [place];  Service: Endoscopy;  Laterality: N/A;  . FOOT FUSION Right   . INGUINAL HERNIA REPAIR Right X 2  . INSERT / REPLACE / REMOVE PACEMAKER  2017   medtronic  . JOINT REPLACEMENT    . LAPAROTOMY N/A 11/21/2016   Procedure: EXPLORATORY LAPAROTOMY, REVISION OF  GASTROJEJUNOSTOMY, ESOPHAGOGASTROSCOPY;  Surgeon: Clovis Riley, MD;  Location: [place];  Service:  General;  Laterality: N/A;  . LEFT ATRIAL APPENDAGE OCCLUSION  2018   placed  via groin  . LUMBAR FUSION  X 2  . PATELLA-FEMORAL ARTHROPLASTY Right 06/02/2017   Procedure: RIGHT PATELLA-FEMORAL ARTHROPLASTY;  Surgeon: Frederik Pear, MD;  Location: [place];  Service: Orthopedics;  Laterality: Right;  . REVERSE SHOULDER ARTHROPLASTY Left    reverse shoulder replacement  . ROUX-EN-Y GASTRIC BYPASS  2005  . ROUX-EN-Y PROCEDURE  20017 X 2   "revisions"  . TONSILLECTOMY AND ADENOIDECTOMY    . TOTAL HIP ARTHROPLASTY Right 02/24/2017   Procedure: TOTAL HIP ARTHROPLASTY;  Surgeon: Earlie Server, MD;  Location: [place];  Service: Orthopedics;  Laterality: Right;  . TOTAL KNEE ARTHROPLASTY Bilateral   . TOTAL SHOULDER ARTHROPLASTY Bilateral   . TUBAL LIGATION      There were no vitals filed for this visit.   Subjective Assessment - 08/30/18 1108    Subjective  Patient began having left shoulder pain about 6 months ago. She has a long history of left shoulder dysfucntion. She had a left shoulder replacemenenmt and had to have a revision. At baseline she wasunable to lift her left arm. She is unable to sleep on her left arm.    Pertinent History  A-fib, stomach ulcers, frequent hospitilizations,    Diagnostic tests  nothing in the chart    Patient Stated Goals  to have less pain in her neck and back    Currently in Pain?  Yes    Pain Score  4    but has teken 3 tylenol   Pain Location  Neck    Pain Orientation  Right;Left    Pain Descriptors / Indicators  Aching    Pain Type  Chronic pain    Pain Onset  More than a month ago    Pain Frequency  Constant    Aggravating Factors   truning her head, use of her shoulder    Pain Relieving Factors  rest    Effect of Pain on Daily Activities  difficulty turning her head    Multiple Pain Sites  Yes    Pain Score  3    Pain Location  Shoulder    Pain Orientation  Left    Pain Descriptors / Indicators  Aching    Pain Type  Chronic pain    Pain Onset   More than a month ago    Pain Frequency  Constant    Aggravating Factors   moving her arm acrossed her body    Pain Relieving Factors  not using her arm    Effect of Pain on Daily Activities  pain using her left arm         [facility] PT Assessment - 08/30/18 0001      Assessment  Medical Diagnosis  Left Shoulder pain and Neck Pain     Referring Provider (PT)  Dr Ruthann Cancer Chambliss/ Nuala Alpha DO     Onset Date/Surgical Date  --   6 months prior    Hand Dominance  Right    Next MD Visit  None     Prior Therapy  For her shoulder follwing replacement       Precautions   Precautions  ICD/Pacemaker      Restrictions   Weight Bearing Restrictions  No      Balance Screen   Has the patient fallen in the past 6 months  No    Has the patient had a decrease in activity level because of a fear of falling?   No    Is the patient reluctant to leave their home because of a fear of falling?   No      Home Film/video editor residence      Prior Function   Level of Independence  Independent    Vocation  Retired      Associate Professor   Overall Cognitive Status  Within Functional Limits for tasks assessed    Attention  Focused    Focused Attention  Appears intact    Memory  Appears intact    Awareness  Appears intact    Problem Solving  Appears intact      Observation/Other Assessments   Observations  very little muscle in her left deltoid. Can feel her prothetic    Focus on Therapeutic Outcomes (FOTO)   56% limitation       Sensation   Light Touch  Appears Intact      Coordination   Gross Motor Movements are Fluid and Coordinated  Yes    Fine Motor Movements are Fluid and Coordinated  Yes      Posture/Postural Control   Posture Comments  rounded shoulders and fsignificant forward head.       ROM / Strength   AROM / PROM / Strength  AROM;PROM;Strength      AROM   Overall AROM Comments  with self assist patient able to lift her right shoulder up into  flexion and hold     AROM Assessment Site  Cervical;Shoulder    Right/Left Shoulder  Left    Left Shoulder Flexion  30 Degrees    Left Shoulder Internal Rotation  --   unable to reach behind her back    Left Shoulder External Rotation  --   unable to reach behind her head    Cervical Flexion  24 with significant pain     Cervical Extension  30 with pain     Cervical - Right Side Bend  able to do in limited range     Cervical - Left Side Bend  able to do more then the right     Cervical - Right Rotation  43    Cervical - Left Rotation  33      Strength   Overall Strength Comments  Very limited active movement of her left shoulder       Palpation   Palpation comment  Signifcant spasming of bilateral cervical paraspinals and into bilateral upper traps                 Objective measurements completed on examination: See above findings.      [facility] Adult PT Treatment/Exercise - 08/30/18 0001      Self-Care   Self-Care  Other  Self-Care Comments    Other Self-Care Comments   improtance of posture when using her phone       Exercises   Exercises  Neck      Neck Exercises: Seated   Other Seated Exercise  scap retraction x10     Other Seated Exercise  cervical rotation x3 in each direction max cuing not to go too far.       Manual Therapy   Manual Therapy  Soft tissue mobilization;Manual Traction    Soft tissue mobilization  to upper traps and cervical spine     Manual Traction  to cervical spine       Neck Exercises: Stretches   Upper Trapezius Stretch Limitations  2x10 seconds for the left              PT Education - 08/30/18 1546    Education Details  improtnace of symptom management and not moving her neck through pain; postural correction    Person(s) Educated  Patient    Methods  Explanation;Demonstration    Comprehension  Verbalized understanding;Verbal cues required;Tactile cues required;Need further instruction       PT Short Term Goals -  08/30/18 1530      PT SHORT TERM GOAL #1   Title  Patient will report 2/10 pain at worst with available movement    Time  3    Period  Weeks    Status  New    Target Date  09/20/18      PT SHORT TERM GOAL #2   Title  Patient will increase bilateral cervical rotation by 10 degrees    Time  3    Period  Weeks    Status  New    Target Date  09/20/18      PT SHORT TERM GOAL #3   Title  Patient will restate the importance of proper posture while using her phone.    Time  3    Period  Weeks    Status  New    Target Date  09/20/18        PT Long Term Goals - 08/30/18 1531      PT LONG TERM GOAL #1   Title  Patient will report no pain with the limited function of her left shoulder    Time  6    Period  Weeks    Status  New    Target Date  10/11/18      PT LONG TERM GOAL #2   Title  Patient will increase bilateral cervical rotation to 86 degrees    Time  6    Period  Weeks    Status  New    Target Date  10/11/18      PT LONG TERM GOAL #3   Title  Patient will sleep thorugh the night without increased neck and shoulder pain    Time  6    Period  Weeks    Status  New    Target Date  10/11/18             Plan - 08/30/18 1123    Clinical Impression Statement  Patient is a 78 year old female with left shoulder and cervical pain. At baseline she has limited function of her left shoulder following multiple left shoulder surgerys inculding a reverse total shoulder. For the past 6 months she has had pain in her left shoulder which hads not turned into more neck pain then shoulder pain. She has  limited active shoulder flexion. She also has limited vervical motion in all planes. She has significant spasming in her bilateral upper traps.    Personal Factors and Comorbidities  Comorbidity 1    Comorbidities  multiple surgies on left shoulder; A-fib, RA, Reynauds, multi-joint OA, pacemaker    Examination-Activity Limitations  Locomotion Level;Reach Overhead;Carry;Lift     Examination-Participation Restrictions  Cleaning;Meal Prep;Community Activity    Stability/Clinical Decision Making  Evolving/Moderate complexity    Clinical Decision Making  Moderate    Rehab Potential  Good    PT Frequency  2x / week    PT Duration  6 weeks    PT Treatment/Interventions  ADLs/Self Care Home Management;Cryotherapy;Electrical Stimulation;Iontophoresis 4mg /ml Dexamethasone;Moist Heat;Ultrasound;DME Instruction;Gait training;Stair training;Functional mobility training;Therapeutic activities;Therapeutic exercise;Neuromuscular re-education;Patient/family education;Manual techniques;Passive range of motion;Taping    PT Next Visit Plan  soft tissue mobilization to upper traps and posterior shoulder; Genlte shoulder ROM; scpualr strengthening; shoiulder strengthening in available ranges; Limited modalities 2nd to reynauds and pacemaker, pustural correction; consider decompression position    PT Home Exercise Plan  cerivcal rotation; scpa retraction, left upper trap stretch    Consulted and Agree with Plan of Care  Patient       Patient will benefit from skilled therapeutic intervention in order to improve the following deficits and impairments:  Pain, Decreased mobility, Decreased strength, Decreased activity tolerance, Decreased endurance, Impaired UE functional use, Increased fascial restricitons, Impaired tone  Visit Diagnosis: Chronic left shoulder pain  Cervicalgia  Other muscle spasm     Problem List Patient Active Problem List   Diagnosis Date Noted  . Fatigue 08/01/2018  . Left shoulder pain 06/30/2018  . Chronic right shoulder pain 06/14/2018  . Anemia due to chronic blood loss 05/07/2018  . Odynophagia 05/07/2018  . Protein-calorie malnutrition ([place]) 05/07/2018  . Absolute anemia   . Cardiac pacemaker in situ 05/06/2018  . History of anastomosis and bypass hemorrhage 05/06/2018  . Atrial fibrillation with rapid ventricular response ([place])   . Dehydration   .  Bilious vomiting with nausea   . Gastrojejunal ulcer   . Acute abdominal pain 05/05/2018  . Rheumatoid arthritis involving both hands ([place]) 09/27/2017  . Seasonal allergies 08/07/2017  . Primary osteoarthritis of right knee 06/02/2017  . Osteoarthritis of right knee 05/30/2017  . Primary osteoarthritis of left hip 02/24/2017  . AAA (abdominal aortic aneurysm) without rupture ([place]) 12/21/2016  . Chronic systolic heart failure ([place]) 12/14/2016  . Atrial fibrillation ([place]) 11/22/2016  . Peripheral neuropathy 11/22/2016  . GERD (gastroesophageal reflux disease) 11/22/2016  . Abdominal pain 11/18/2016  . 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 Parasomnia 11/18/2016    Carney Living PT DPT  08/30/2018, 3:48 PM  [facility] [address] [place], [place], [address] Phone: [phone]   Fax:  [phone]  Name: AZURE BUDNICK MRN: [id number] Date of Birth: 1940-05-25

## 2018-08-31 ENCOUNTER — Encounter: Payer: Self-pay | Admitting: Physical Therapy

## 2018-09-03 ENCOUNTER — Other Ambulatory Visit: Payer: Self-pay | Admitting: Family Medicine

## 2018-09-03 ENCOUNTER — Other Ambulatory Visit: Payer: Self-pay | Admitting: Pharmacy Technician

## 2018-09-03 MED ORDER — GABAPENTIN 300 MG PO CAPS: 300.0000 mg | ORAL_CAPSULE | Freq: Three times a day (TID) | ORAL | 6 refills | Status: DC

## 2018-09-03 MED ORDER — GABAPENTIN 300 MG PO CAPS: ORAL_CAPSULE | ORAL | 6 refills | Status: DC

## 2018-09-03 NOTE — Progress Notes (Signed)
Resending prescription as pharmacy said there was a discrepancy with frequency on first prescription

## 2018-09-03 NOTE — Patient Outreach (Signed)
[facility]) Care Management  09/03/2018  Deanna Spencer [birth year] [id number]   Unsuccessful outreach call placed to patient in regards to [employer] application for [place].  Unfortunately patient did not answer the phone.  Was calling patient to confirm she mailed the application back as she informed [facility] Deanna Spencer that she had received it per her phone call with patient on 08/29/2018.  Will followup with patient in 10-14 business days if application has been received back.  Boykin Baetz P. Shamila Lerch, [place] Management [phone]   [place]. Breyden Jeudy, [place] Management [phone]

## 2018-09-04 ENCOUNTER — Ambulatory Visit: Payer: Self-pay | Admitting: Pharmacist

## 2018-09-04 ENCOUNTER — Ambulatory Visit: Payer: Medicare Other | Admitting: Physical Therapy

## 2018-09-04 ENCOUNTER — Other Ambulatory Visit: Payer: Self-pay

## 2018-09-04 ENCOUNTER — Encounter: Payer: Self-pay | Admitting: Physical Therapy

## 2018-09-04 ENCOUNTER — Other Ambulatory Visit: Payer: Self-pay | Admitting: Pharmacist

## 2018-09-04 DIAGNOSIS — M542 Cervicalgia: Secondary | ICD-10-CM

## 2018-09-04 DIAGNOSIS — M62838 Other muscle spasm: Secondary | ICD-10-CM

## 2018-09-04 DIAGNOSIS — G8929 Other chronic pain: Secondary | ICD-10-CM

## 2018-09-04 DIAGNOSIS — M25512 Pain in left shoulder: Secondary | ICD-10-CM | POA: Diagnosis not present

## 2018-09-04 MED ORDER — GABAPENTIN 300 MG PO CAPS: 300.0000 mg | ORAL_CAPSULE | Freq: Three times a day (TID) | ORAL | 5 refills | Status: DC

## 2018-09-04 NOTE — Patient Outreach (Addendum)
[facility]) Care Management  09/04/2018  Deanna Spencer 1940-06-12 [id number]   Patient was called to follow up on pill packs. Unfortunately, she did not answer the phone. HIPAA compliant message was left on her voicemail.  Plan: Call patient back in 7-10 business days.   Elayne Guerin, PharmD, [place] Clinical Pharmacist [phone]  ADDENDUM  Patient called me back. HIPAA identifiers were obtained. She confirmed [facility] called her and confirmed that her pill packs should be completed on this coming Saturday.  Patient's chart was reviewed and her provider sent the requested prescriptions to [facility].   Patient said she mailed the application for Tov[place] back to [facility]. Her provider changed her therapy in July to Tolterodine ([place]) but she said it was even more expensive.   Plan: Follow up with the patient in 1-2 weeks. Send Dr. Garlan Fillers an inbox about [name].  Elayne Guerin, PharmD, [place] Clinical Pharmacist [phone]

## 2018-09-04 NOTE — Therapy (Signed)
[place], [place], [address] Phone: [phone]   Fax:  [phone]  Physical Therapy Treatment  Patient Details  Name: Deanna Spencer MRN: [id number] Date of Birth: 1940/06/20 Referring Provider (PT): Dr Ruthann Cancer Chambliss/ Nuala Alpha DO    Encounter Date: 09/04/2018  PT End of Session - 09/04/18 1019    Visit Number  2    Number of Visits  12    Date for PT Re-Evaluation  10/11/18    PT Start Time  [id number]    PT Stop Time  1050    PT Time Calculation (min)  35 min    Activity Tolerance  Patient tolerated treatment well    Behavior During Therapy  [facility] for tasks assessed/performed       Past Medical History:  Diagnosis Date  . AAA (abdominal aortic aneurysm) ([place])    3.5cm by CT 11/29/16  . AAA (abdominal aortic aneurysm) without rupture ([place]) 12/21/2016   Stable 3cm 4/20, needs f/u imaging ~4/23  . Acute abdominal pain 05/05/2018  . Anemia   . Atrial fibrillation ([place]) 11/22/2016  . Bee sting 06/28/2017  . Cerumen impaction 06/28/2017  . Childhood asthma    connected to allergies  . Chronic lower back pain   . Chronic systolic heart failure ([place]) 12/14/2016  . Diplopia 09/27/2017  . Diverticulosis   . Duodenal ulcer   . Dyspnea   . Dysrhythmia    a fib  . GERD (gastroesophageal reflux disease)   . GI bleed    "have had both upper and lower GIB"  . GI bleed 05/07/2018  . Gout   . Heart attack ([place]) 2017  . Heart murmur   . History of anastomosis and bypass hemorrhage 05/06/2018  . History of blood transfusion 2017-2018   "related to bleeding ulcers; 47 units PRBC; 2U plasma" (11/23/2016)  . History of duodenal ulcer 11/18/2016  . History of heart attack 11/18/2016  . History of hiatal hernia   . Hx of blood clots    in heart  . IBS (irritable bowel syndrome)   . Liver lesion 08/07/2017  . Neuropathy   . Osteoarthritis   . Osteoarthritis of right knee 05/30/2017  . Osteoporosis   .  Other chronic pain 11/18/2016  . Parasomnia 11/18/2016  . Perforated viscus 11/22/2016  . Peripheral neuropathy   . Presence of permanent cardiac pacemaker 10/2015   medtronic; Serial # [id number]  . Presence of Watchman left atrial appendage closure device   . Primary osteoarthritis of left hip 02/24/2017  . 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 Primary osteoarthritis of right knee 06/02/2017  . Raynaud disease   . Rheumatoid arteritis ([place])   . Rheumatoid arthritis involving both hands ([place]) 09/27/2017  . S/P exploratory laparotomy 11/22/2016  . Seasonal allergies 08/07/2017  . 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 SSS (sick sinus syndrome) ([place])    a. s/p Medtronic PPM placement in 2017  . Stomach ulcer   . Vision changes 08/07/2017    Past Surgical History:  Procedure Laterality Date  . ABDOMINAL HYSTERECTOMY    . APPENDECTOMY    . ARTHROPLASTY Right 06/02/2017   RIGHT PATELLA-FEMORAL ARTHROPLASTY, with placement of a cemented 29 mm Stryker asymmetric triathlon patellar button, revision of the tibial polyethylene bearing.  Marland Kitchen BIOPSY  05/06/2018   Procedure: BIOPSY;  Surgeon: Gatha Mayer, MD;  Location: [place];  Service: Endoscopy;;  . CARPAL TUNNEL RELEASE Bilateral   . CATARACT EXTRACTION  W/ INTRAOCULAR LENS  IMPLANT, BILATERAL Bilateral   . CHOLECYSTECTOMY OPEN    . DILATION AND CURETTAGE OF UTERUS    . ESOPHAGOGASTRODUODENOSCOPY (EGD) WITH PROPOFOL N/A 05/06/2018   Procedure: ESOPHAGOGASTRODUODENOSCOPY (EGD) WITH PROPOFOL;  Surgeon: Gatha Mayer, MD;  Location: [place];  Service: Endoscopy;  Laterality: N/A;  . FOOT FUSION Right   . INGUINAL HERNIA REPAIR Right X 2  . INSERT / REPLACE / REMOVE PACEMAKER  2017   medtronic  . JOINT REPLACEMENT    . LAPAROTOMY N/A 11/21/2016   Procedure: EXPLORATORY LAPAROTOMY, REVISION OF  GASTROJEJUNOSTOMY, ESOPHAGOGASTROSCOPY;  Surgeon: Clovis Riley, MD;  Location: [place];  Service: General;  Laterality: N/A;  . LEFT  ATRIAL APPENDAGE OCCLUSION  2018   placed  via groin  . LUMBAR FUSION  X 2  . PATELLA-FEMORAL ARTHROPLASTY Right 06/02/2017   Procedure: RIGHT PATELLA-FEMORAL ARTHROPLASTY;  Surgeon: Frederik Pear, MD;  Location: [place];  Service: Orthopedics;  Laterality: Right;  . REVERSE SHOULDER ARTHROPLASTY Left    reverse shoulder replacement  . ROUX-EN-Y GASTRIC BYPASS  2005  . ROUX-EN-Y PROCEDURE  20017 X 2   "revisions"  . TONSILLECTOMY AND ADENOIDECTOMY    . TOTAL HIP ARTHROPLASTY Right 02/24/2017   Procedure: TOTAL HIP ARTHROPLASTY;  Surgeon: Earlie Server, MD;  Location: ;  Service: Orthopedics;  Laterality: Right;  . TOTAL KNEE ARTHROPLASTY Bilateral   . TOTAL SHOULDER ARTHROPLASTY Bilateral   . TUBAL LIGATION      There were no vitals filed for this visit.  Subjective Assessment - 09/04/18 1018    Subjective  Shoulder is sore today.    Currently in Pain?  Yes    Pain Score  4     Pain Location  Shoulder    Pain Orientation  Left    Pain Descriptors / Indicators  Sore                       [facility] Adult PT Treatment/Exercise - 09/04/18 0001      Exercises   Exercises  Shoulder      Shoulder Exercises: Supine   Flexion Limitations  chest press with wand    Other Supine Exercises  biceps curls      Shoulder Exercises: Seated   Row Limitations  band loop around knees and row back    Flexion Limitations  short arc flexion    ABduction Limitations  short arc abduction      Manual Therapy   Manual Therapy  Passive ROM    Soft tissue mobilization  bilateral upper traps    Passive ROM  LT GHJ               PT Short Term Goals - 08/30/18 1530      PT SHORT TERM GOAL #1   Title  Patient will report 2/10 pain at worst with available movement    Time  3    Period  Weeks    Status  New    Target Date  09/20/18      PT SHORT TERM GOAL #2   Title  Patient will increase bilateral cervical rotation by 10 degrees    Time  3    Period  Weeks    Status   New    Target Date  09/20/18      PT SHORT TERM GOAL #3   Title  Patient will restate the importance of proper posture while using her phone.  Time  3    Period  Weeks    Status  New    Target Date  09/20/18        PT Long Term Goals - 08/30/18 1531      PT LONG TERM GOAL #1   Title  Patient will report no pain with the limited function of her left shoulder    Time  6    Period  Weeks    Status  New    Target Date  10/11/18      PT LONG TERM GOAL #2   Title  Patient will increase bilateral cervical rotation to 86 degrees    Time  6    Period  Weeks    Status  New    Target Date  10/11/18      PT LONG TERM GOAL #3   Title  Patient will sleep thorugh the night without increased neck and shoulder pain    Time  6    Period  Weeks    Status  New    Target Date  10/11/18            Plan - 09/04/18 1206    Clinical Impression Statement  Significant weakness in shoulder in all motions requiring AAROM to move through motions. Did well focusing on unweighted motion and reported fatigue.    PT Treatment/Interventions  ADLs/Self Care Home Management;Cryotherapy;Electrical Stimulation;Iontophoresis 4mg /ml Dexamethasone;Moist Heat;Ultrasound;DME Instruction;Gait training;Stair training;Functional mobility training;Therapeutic activities;Therapeutic exercise;Neuromuscular re-education;Patient/family education;Manual techniques;Passive range of motion;Taping    PT Next Visit Plan  soft tissue mobilization to upper traps and posterior shoulder; Genlte shoulder ROM; scpualr strengthening; shoiulder strengthening in available ranges; Limited modalities 2nd to reynauds and pacemaker, pustural correction; consider decompression position    PT Home Exercise Plan  cerivcal rotation; scpa retraction, left upper trap stretch, seated short arc flx & abd, supine chest press, row anchored    Consulted and Agree with Plan of Care  Patient       Patient will benefit from skilled therapeutic  intervention in order to improve the following deficits and impairments:  Pain, Decreased mobility, Decreased strength, Decreased activity tolerance, Decreased endurance, Impaired UE functional use, Increased fascial restricitons, Impaired tone  Visit Diagnosis: Chronic left shoulder pain  Cervicalgia  Other muscle spasm     Problem List Patient Active Problem List   Diagnosis Date Noted  . Fatigue 08/01/2018  . Left shoulder pain 06/30/2018  . Chronic right shoulder pain 06/14/2018  . Anemia due to chronic blood loss 05/07/2018  . Odynophagia 05/07/2018  . Protein-calorie malnutrition ([place]) 05/07/2018  . Absolute anemia   . Cardiac pacemaker in situ 05/06/2018  . History of anastomosis and bypass hemorrhage 05/06/2018  . Atrial fibrillation with rapid ventricular response ([place])   . Dehydration   . Bilious vomiting with nausea   . Gastrojejunal ulcer   . Acute abdominal pain 05/05/2018  . Rheumatoid arthritis involving both hands ([place]) 09/27/2017  . Seasonal allergies 08/07/2017  . Primary osteoarthritis of right knee 06/02/2017  . Osteoarthritis of right knee 05/30/2017  . Primary osteoarthritis of left hip 02/24/2017  . AAA (abdominal aortic aneurysm) without rupture ([place]) 12/21/2016  . Chronic systolic heart failure ([place]) 12/14/2016  . Atrial fibrillation ([place]) 11/22/2016  . Peripheral neuropathy 11/22/2016  . GERD (gastroesophageal reflux disease) 11/22/2016  . Abdominal pain 11/18/2016  . 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 Parasomnia 11/18/2016  Deontay Ladnier C. Cova Knieriem PT, DPT 09/04/18 12:11 PM  [place], [place], [address] Phone: [phone]   Fax:  [phone]  Name: SADA VARDA MRN: [id number] Date of Birth: Nov 26, 1940

## 2018-09-04 NOTE — Addendum Note (Signed)
Addended by: Christen Bame D on: 09/04/2018 01:42 PM   Modules accepted: Orders

## 2018-09-06 ENCOUNTER — Telehealth: Payer: Self-pay | Admitting: Gastroenterology

## 2018-09-06 NOTE — Telephone Encounter (Signed)
[facility] anesthesia request that this patient's procedures be moved to the hospital endoscopy lab due to cardiac condition and history of difficult IV access. Please inform the patient, and arrange move to next available outpatient hospital slot with me.

## 2018-09-06 NOTE — Telephone Encounter (Addendum)
Pt is aware that procedure is cancelled. We will call her with a hospital date. Pt due to transportation , she needs a Tuesday or Wednesday.

## 2018-09-06 NOTE — Telephone Encounter (Signed)
Left message to call.

## 2018-09-06 NOTE — Telephone Encounter (Signed)
I cannot accommodate that until my October [facility] hospital endoscopy block.

## 2018-09-07 ENCOUNTER — Encounter

## 2018-09-10 ENCOUNTER — Ambulatory Visit: Payer: Medicare Other | Admitting: Physical Therapy

## 2018-09-10 ENCOUNTER — Other Ambulatory Visit: Payer: Self-pay

## 2018-09-10 ENCOUNTER — Encounter: Payer: Self-pay | Admitting: Physical Therapy

## 2018-09-10 ENCOUNTER — Encounter: Payer: Medicare Other | Admitting: Gastroenterology

## 2018-09-10 DIAGNOSIS — G8929 Other chronic pain: Secondary | ICD-10-CM

## 2018-09-10 DIAGNOSIS — M62838 Other muscle spasm: Secondary | ICD-10-CM

## 2018-09-10 DIAGNOSIS — M542 Cervicalgia: Secondary | ICD-10-CM

## 2018-09-10 DIAGNOSIS — M25512 Pain in left shoulder: Secondary | ICD-10-CM

## 2018-09-10 NOTE — Therapy (Signed)
[place], [place], [address] Phone: [phone]   Fax:  [phone]  Physical Therapy Treatment  Patient Details  Name: Deanna Spencer MRN: [id number] Date of Birth: 1940-10-25 Referring Provider (PT): Dr Ruthann Cancer Chambliss/ Nuala Alpha DO    Encounter Date: 09/10/2018  PT End of Session - 09/10/18 1146    Visit Number  3    Number of Visits  12    Date for PT Re-Evaluation  10/11/18    Authorization Type  Medicare    PT Start Time  1135    PT Stop Time  1215    PT Time Calculation (min)  40 min    Activity Tolerance  Patient tolerated treatment well    Behavior During Therapy  [facility] for tasks assessed/performed       Past Medical History:  Diagnosis Date  . AAA (abdominal aortic aneurysm) ([place])    3.5cm by CT 11/29/16  . AAA (abdominal aortic aneurysm) without rupture ([place]) 12/21/2016   Stable 3cm 4/20, needs f/u imaging ~4/23  . Acute abdominal pain 05/05/2018  . Anemia   . Atrial fibrillation ([place]) 11/22/2016  . Bee sting 06/28/2017  . Cerumen impaction 06/28/2017  . Childhood asthma    connected to allergies  . Chronic lower back pain   . Chronic systolic heart failure ([place]) 12/14/2016  . Diplopia 09/27/2017  . Diverticulosis   . Duodenal ulcer   . Dyspnea   . Dysrhythmia    a fib  . GERD (gastroesophageal reflux disease)   . GI bleed    "have had both upper and lower GIB"  . GI bleed 05/07/2018  . Gout   . Heart attack ([place]) 2017  . Heart murmur   . History of anastomosis and bypass hemorrhage 05/06/2018  . History of blood transfusion 2017-2018   "related to bleeding ulcers; 47 units PRBC; 2U plasma" (11/23/2016)  . History of duodenal ulcer 11/18/2016  . History of heart attack 11/18/2016  . History of hiatal hernia   . Hx of blood clots    in heart  . IBS (irritable bowel syndrome)   . Liver lesion 08/07/2017  . Neuropathy   . Osteoarthritis   . Osteoarthritis of right knee  05/30/2017  . Osteoporosis   . Other chronic pain 11/18/2016  . Parasomnia 11/18/2016  . Perforated viscus 11/22/2016  . Peripheral neuropathy   . 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 Presence of Watchman left atrial appendage closure device   . Primary osteoarthritis of left hip 02/24/2017  . 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 Primary osteoarthritis of right knee 06/02/2017  . Raynaud disease   . Rheumatoid arteritis ([place])   . Rheumatoid arthritis involving both hands ([place]) 09/27/2017  . S/P exploratory laparotomy 11/22/2016  . Seasonal allergies 08/07/2017  . 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 SSS (sick sinus syndrome) ([place])    a. s/p Medtronic PPM placement in 2017  . Stomach ulcer   . Vision changes 08/07/2017    Past Surgical History:  Procedure Laterality Date  . ABDOMINAL HYSTERECTOMY    . APPENDECTOMY    . ARTHROPLASTY Right 06/02/2017   RIGHT PATELLA-FEMORAL ARTHROPLASTY, with placement of a cemented 29 mm Stryker asymmetric triathlon patellar button, revision of the tibial polyethylene bearing.  Marland Kitchen BIOPSY  05/06/2018   Procedure: BIOPSY;  Surgeon: Gatha Mayer, MD;  Location: [place];  Service: Endoscopy;;  . CARPAL TUNNEL  RELEASE Bilateral   . CATARACT EXTRACTION W/ INTRAOCULAR LENS  IMPLANT, BILATERAL Bilateral   . CHOLECYSTECTOMY OPEN    . DILATION AND CURETTAGE OF UTERUS    . ESOPHAGOGASTRODUODENOSCOPY (EGD) WITH PROPOFOL N/A 05/06/2018   Procedure: ESOPHAGOGASTRODUODENOSCOPY (EGD) WITH PROPOFOL;  Surgeon: Gatha Mayer, MD;  Location: [place];  Service: Endoscopy;  Laterality: N/A;  . FOOT FUSION Right   . INGUINAL HERNIA REPAIR Right X 2  . INSERT / REPLACE / REMOVE PACEMAKER  2017   medtronic  . JOINT REPLACEMENT    . LAPAROTOMY N/A 11/21/2016   Procedure: EXPLORATORY LAPAROTOMY, REVISION OF  GASTROJEJUNOSTOMY, ESOPHAGOGASTROSCOPY;  Surgeon: Clovis Riley, MD;  Location: [place];  Service:  General;  Laterality: N/A;  . LEFT ATRIAL APPENDAGE OCCLUSION  2018   placed  via groin  . LUMBAR FUSION  X 2  . PATELLA-FEMORAL ARTHROPLASTY Right 06/02/2017   Procedure: RIGHT PATELLA-FEMORAL ARTHROPLASTY;  Surgeon: Frederik Pear, MD;  Location: [place];  Service: Orthopedics;  Laterality: Right;  . REVERSE SHOULDER ARTHROPLASTY Left    reverse shoulder replacement  . ROUX-EN-Y GASTRIC BYPASS  2005  . ROUX-EN-Y PROCEDURE  20017 X 2   "revisions"  . TONSILLECTOMY AND ADENOIDECTOMY    . TOTAL HIP ARTHROPLASTY Right 02/24/2017   Procedure: TOTAL HIP ARTHROPLASTY;  Surgeon: Earlie Server, MD;  Location: [place];  Service: Orthopedics;  Laterality: Right;  . TOTAL KNEE ARTHROPLASTY Bilateral   . TOTAL SHOULDER ARTHROPLASTY Bilateral   . TUBAL LIGATION      There were no vitals filed for this visit.  Subjective Assessment - 09/10/18 1142    Subjective  Pt arriving today reporting 6/10 pain in left shoulder pointing to anterior lateral shoulder.    Pertinent History  A-fib, stomach ulcers, frequent hospitilizations,    Diagnostic tests  nothing in the chart    Patient Stated Goals  to have less pain in her neck and back    Currently in Pain?  Yes    Pain Score  6     Pain Location  Shoulder    Pain Orientation  Left    Pain Descriptors / Indicators  Aching;Sore    Pain Type  Chronic pain    Pain Onset  More than a month ago    Multiple Pain Sites  No                       [facility] Adult PT Treatment/Exercise - 09/10/18 0001      Exercises   Exercises  Shoulder      Neck Exercises: Seated   Other Seated Exercise  scapular retraction, shoulder depression x 10 holding 5-10 seconds      Neck Exercises: Supine   Neck Retraction  10 reps;5 secs;Limitations    Neck Retraction Limitations  instructions to tuck chin    Cervical Rotation  Both;5 reps      Shoulder Exercises: Supine   Flexion  AAROM;Both;10 reps;Limitations    Shoulder Flexion Weight (lbs)  minimal  assistance to keep L grip on wand    Flexion Limitations  chest press with wand and intermittent assistance to reach full elbow extension x 10 reps    Other Supine Exercises  bicep curls using wand, serratus punches x 10 with stabaliztion for left elbow.       Shoulder Exercises: Isometric Strengthening   Flexion  Supine;5X5"    Extension  Supine;5X5"    External Rotation  Supine;5X5"    Internal  Rotation  Supine;5X5"      Manual Therapy   Manual Therapy  Soft tissue mobilization;Passive ROM    Manual therapy comments  10 minutes    Soft tissue mobilization  left upper trap    Passive ROM  Left GHJ      Neck Exercises: Stretches   Upper Trapezius Stretch Limitations  seated 3 reps x 10 seconds             PT Education - 09/10/18 1145    Education Details  Pt requiring instructions for all exercises for technique and form.    Person(s) Educated  Patient    Methods  Explanation;Demonstration    Comprehension  Returned demonstration;Verbalized understanding;Verbal cues required;Need further instruction       PT Short Term Goals - 09/10/18 1235      PT SHORT TERM GOAL #1   Title  Patient will report 2/10 pain at worst with available movement    Time  3    Period  Weeks    Status  On-going      PT SHORT TERM GOAL #2   Title  Patient will increase bilateral cervical rotation by 10 degrees    Time  3    Period  Weeks    Status  New      PT SHORT TERM GOAL #3   Title  Patient will restate the importance of proper posture while using her phone.    Time  3    Period  Weeks    Status  New        PT Long Term Goals - 08/30/18 1531      PT LONG TERM GOAL #1   Title  Patient will report no pain with the limited function of her left shoulder    Time  6    Period  Weeks    Status  New    Target Date  10/11/18      PT LONG TERM GOAL #2   Title  Patient will increase bilateral cervical rotation to 86 degrees    Time  6    Period  Weeks    Status  New    Target  Date  10/11/18      PT LONG TERM GOAL #3   Title  Patient will sleep thorugh the night without increased neck and shoulder pain    Time  6    Period  Weeks    Status  New    Target Date  10/11/18            Plan - 09/10/18 1226    Clinical Impression Statement  Pt arriving today reporting 6/10 pain in left shoulder. Pt with significant weakness noted and required assistance for shoulder flexion and some exercises. Pt also with active trigger points noted in left upper trap. Continue with skilled PT no goals met this session.    Personal Factors and Comorbidities  Comorbidity 1    Comorbidities  multiple surgies on left shoulder; A-fib, RA, Reynauds, multi-joint OA, pacemaker    Examination-Activity Limitations  Locomotion Level;Reach Overhead;Carry;Lift    Examination-Participation Restrictions  Cleaning;Meal Prep;Community Activity    Stability/Clinical Decision Making  Evolving/Moderate complexity    Rehab Potential  Good    PT Frequency  2x / week    PT Duration  6 weeks    PT Treatment/Interventions  ADLs/Self Care Home Management;Cryotherapy;Electrical Stimulation;Iontophoresis 61m/ml Dexamethasone;Moist Heat;Ultrasound;DME Instruction;Gait training;Stair training;Functional mobility training;Therapeutic activities;Therapeutic exercise;Neuromuscular re-education;Patient/family education;Manual techniques;Passive range of motion;Taping  PT Next Visit Plan  soft tissue mobilization to upper traps and posterior shoulder; Genlte shoulder ROM; scpualr strengthening; shoiulder strengthening in available ranges; Limited modalities 2nd to reynauds and pacemaker, pustural correction; consider decompression position    PT Home Exercise Plan  cerivcal rotation; scpa retraction, left upper trap stretch, seated short arc flx & abd, supine chest press, row anchored    Consulted and Agree with Plan of Care  Patient       Patient will benefit from skilled therapeutic intervention in order to  improve the following deficits and impairments:  Pain, Decreased mobility, Decreased strength, Decreased activity tolerance, Decreased endurance, Impaired UE functional use, Increased fascial restricitons, Impaired tone  Visit Diagnosis: Chronic left shoulder pain  Cervicalgia  Other muscle spasm     Problem List Patient Active Problem List   Diagnosis Date Noted  . Fatigue 08/01/2018  . Left shoulder pain 06/30/2018  . Chronic right shoulder pain 06/14/2018  . Anemia due to chronic blood loss 05/07/2018  . Odynophagia 05/07/2018  . Protein-calorie malnutrition ([place]) 05/07/2018  . Absolute anemia   . Cardiac pacemaker in situ 05/06/2018  . History of anastomosis and bypass hemorrhage 05/06/2018  . Atrial fibrillation with rapid ventricular response ([place])   . Dehydration   . Bilious vomiting with nausea   . Gastrojejunal ulcer   . Acute abdominal pain 05/05/2018  . Rheumatoid arthritis involving both hands ([place]) 09/27/2017  . Seasonal allergies 08/07/2017  . Primary osteoarthritis of right knee 06/02/2017  . Osteoarthritis of right knee 05/30/2017  . Primary osteoarthritis of left hip 02/24/2017  . AAA (abdominal aortic aneurysm) without rupture ([place]) 12/21/2016  . Chronic systolic heart failure ([place]) 12/14/2016  . Atrial fibrillation ([place]) 11/22/2016  . Peripheral neuropathy 11/22/2016  . GERD (gastroesophageal reflux disease) 11/22/2016  . Abdominal pain 11/18/2016  . 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 Parasomnia 11/18/2016    Oretha Caprice, PT 09/10/2018, 12:36 PM  [facility] [address] [place], [place], [address] Phone: [phone]   Fax:  [phone]  Name: Deanna Spencer MRN: [id number] Date of Birth: Nov 20, 1940

## 2018-09-10 NOTE — Patient Outreach (Signed)
[facility]) Care Management  09/10/2018  Cleaster A Robart 01-02-1941 [id number]    1st unsuccessful outreach to the patient for initial assessment.  No answer.  Pnone rang six times with pick up.  Plan: RN Health Coach will send letter. [place] [facility] will make outreach attempt to the patient within thirty business days.  Lazaro Arms RN, BSN, [place] Direct Dial:  [phone]  Fax: [phone]

## 2018-09-11 ENCOUNTER — Other Ambulatory Visit: Payer: Self-pay | Admitting: Pharmacist

## 2018-09-11 NOTE — Patient Outreach (Signed)
[facility]) Care Management  09/11/2018  Deanna Spencer 1940/06/28 [id number]   Patient called wondering if [facility] could help her access sulcralfate tablets cheaper. HIPAA identifiers were obtained. Patient confirmed she received her medications in pill packing from [facility].   She reported being able to manage her packs with ease but was concerned about the copay of Sucralfate. She said it was $47.    Unfortunately, Sucralfate tablets are generic, there is not a patient assistance program available to receive sucralfate at no cost. If the patient were switched back to the suspension, there may be an opportunity to get Sucralfate suspension from [employer] but our experience with [employer] is that their eligibility criteria is very steep and requires patient's to have copay's that are several hundred dollars or large amounts of medication expenses.  [facility] will provide Sucralfate for $30/120 tablets. This is a $17 savings but the tablets would no longer be in the pill pack and it would mean the patient would have prescriptions filled at multiple pharmacies.   Patient decided [facility] and continue to get it in the pill pack.  Plan: Call patient back in 7-10 business days to check on her.   Elayne Guerin, PharmD, [place] Clinical Pharmacist [phone]

## 2018-09-12 ENCOUNTER — Encounter: Payer: Medicare Other | Admitting: Gastroenterology

## 2018-09-12 ENCOUNTER — Other Ambulatory Visit: Payer: Self-pay

## 2018-09-12 NOTE — Patient Outreach (Signed)
[place] [facility]) Care Management  09/12/2018  Lylliana A Harrison January 06, 1941 [id number]    2nd unsuccessful attempt to outreach the patient for initial assessment.  No Answer. HIPAA compliant voicemail left contact information.  Plan: [place] will make outreach attempt to the patient within thirty business days.  Lazaro Arms RN, BSN, [place] Direct Dial:  [phone]  Fax: [phone]

## 2018-09-13 ENCOUNTER — Other Ambulatory Visit: Payer: Self-pay

## 2018-09-13 ENCOUNTER — Ambulatory Visit: Payer: Medicare Other | Attending: Family Medicine | Admitting: Physical Therapy

## 2018-09-13 ENCOUNTER — Encounter: Payer: Self-pay | Admitting: Physical Therapy

## 2018-09-13 DIAGNOSIS — M62838 Other muscle spasm: Secondary | ICD-10-CM | POA: Diagnosis not present

## 2018-09-13 DIAGNOSIS — G8929 Other chronic pain: Secondary | ICD-10-CM | POA: Insufficient documentation

## 2018-09-13 DIAGNOSIS — M542 Cervicalgia: Secondary | ICD-10-CM | POA: Insufficient documentation

## 2018-09-13 DIAGNOSIS — M25512 Pain in left shoulder: Secondary | ICD-10-CM | POA: Insufficient documentation

## 2018-09-13 NOTE — Therapy (Signed)
[place], [place], [address] Phone: [phone]   Fax:  [phone]  Physical Therapy Treatment  Patient Details  Name: Deanna Spencer MRN: [id number] Date of Birth: 21-Feb-1940 Referring Provider (PT): Dr Ruthann Cancer Chambliss/ Nuala Alpha DO    Encounter Date: 09/13/2018  PT End of Session - 09/13/18 1103    Visit Number  4    Number of Visits  12    Date for PT Re-Evaluation  10/11/18    Authorization Type  Medicare    PT Start Time  0930    PT Stop Time  1012    PT Time Calculation (min)  42 min    Activity Tolerance  Patient tolerated treatment well    Behavior During Therapy  [facility] for tasks assessed/performed       Past Medical History:  Diagnosis Date  . AAA (abdominal aortic aneurysm) ([place])    3.5cm by CT 11/29/16  . AAA (abdominal aortic aneurysm) without rupture ([place]) 12/21/2016   Stable 3cm 4/20, needs f/u imaging ~4/23  . Acute abdominal pain 05/05/2018  . Anemia   . Atrial fibrillation ([place]) 11/22/2016  . Bee sting 06/28/2017  . Cerumen impaction 06/28/2017  . Childhood asthma    connected to allergies  . Chronic lower back pain   . Chronic systolic heart failure ([place]) 12/14/2016  . Diplopia 09/27/2017  . Diverticulosis   . Duodenal ulcer   . Dyspnea   . Dysrhythmia    a fib  . GERD (gastroesophageal reflux disease)   . GI bleed    "have had both upper and lower GIB"  . GI bleed 05/07/2018  . Gout   . Heart attack ([place]) 2017  . Heart murmur   . History of anastomosis and bypass hemorrhage 05/06/2018  . History of blood transfusion 2017-2018   "related to bleeding ulcers; 47 units PRBC; 2U plasma" (11/23/2016)  . History of duodenal ulcer 11/18/2016  . History of heart attack 11/18/2016  . History of hiatal hernia   . Hx of blood clots    in heart  . IBS (irritable bowel syndrome)   . Liver lesion 08/07/2017  . Neuropathy   . Osteoarthritis   . Osteoarthritis of right knee  05/30/2017  . Osteoporosis   . Other chronic pain 11/18/2016  . Parasomnia 11/18/2016  . Perforated viscus 11/22/2016  . Peripheral neuropathy   . Presence of permanent cardiac pacemaker 10/2015   medtronic; Serial # [id number]  . Presence of Watchman left atrial appendage closure device   . Primary osteoarthritis of left hip 02/24/2017  . 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 Primary osteoarthritis of right knee 06/02/2017  . Raynaud disease   . Rheumatoid arteritis ([place])   . Rheumatoid arthritis involving both hands ([place]) 09/27/2017  . S/P exploratory laparotomy 11/22/2016  . Seasonal allergies 08/07/2017  . 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 SSS (sick sinus syndrome) ([place])    a. s/p Medtronic PPM placement in 2017  . Stomach ulcer   . Vision changes 08/07/2017    Past Surgical History:  Procedure Laterality Date  . ABDOMINAL HYSTERECTOMY    . APPENDECTOMY    . ARTHROPLASTY Right 06/02/2017   RIGHT PATELLA-FEMORAL ARTHROPLASTY, with placement of a cemented 29 mm Stryker asymmetric triathlon patellar button, revision of the tibial polyethylene bearing.  Marland Kitchen BIOPSY  05/06/2018   Procedure: BIOPSY;  Surgeon: Gatha Mayer, MD;  Location: [place];  Service: Endoscopy;;  . CARPAL TUNNEL  RELEASE Bilateral   . CATARACT EXTRACTION W/ INTRAOCULAR LENS  IMPLANT, BILATERAL Bilateral   . CHOLECYSTECTOMY OPEN    . DILATION AND CURETTAGE OF UTERUS    . ESOPHAGOGASTRODUODENOSCOPY (EGD) WITH PROPOFOL N/A 05/06/2018   Procedure: ESOPHAGOGASTRODUODENOSCOPY (EGD) WITH PROPOFOL;  Surgeon: Gatha Mayer, MD;  Location: [place];  Service: Endoscopy;  Laterality: N/A;  . FOOT FUSION Right   . INGUINAL HERNIA REPAIR Right X 2  . INSERT / REPLACE / REMOVE PACEMAKER  2017   medtronic  . JOINT REPLACEMENT    . LAPAROTOMY N/A 11/21/2016   Procedure: EXPLORATORY LAPAROTOMY, REVISION OF  GASTROJEJUNOSTOMY, ESOPHAGOGASTROSCOPY;  Surgeon: Clovis Riley, MD;  Location: [place];  Service:  General;  Laterality: N/A;  . LEFT ATRIAL APPENDAGE OCCLUSION  2018   placed  via groin  . LUMBAR FUSION  X 2  . PATELLA-FEMORAL ARTHROPLASTY Right 06/02/2017   Procedure: RIGHT PATELLA-FEMORAL ARTHROPLASTY;  Surgeon: Frederik Pear, MD;  Location: [place];  Service: Orthopedics;  Laterality: Right;  . REVERSE SHOULDER ARTHROPLASTY Left    reverse shoulder replacement  . ROUX-EN-Y GASTRIC BYPASS  2005  . ROUX-EN-Y PROCEDURE  20017 X 2   "revisions"  . TONSILLECTOMY AND ADENOIDECTOMY    . TOTAL HIP ARTHROPLASTY Right 02/24/2017   Procedure: TOTAL HIP ARTHROPLASTY;  Surgeon: Earlie Server, MD;  Location: [place];  Service: Orthopedics;  Laterality: Right;  . TOTAL KNEE ARTHROPLASTY Bilateral   . TOTAL SHOULDER ARTHROPLASTY Bilateral   . TUBAL LIGATION      There were no vitals filed for this visit.  Subjective Assessment - 09/13/18 1100    Subjective  Patient reports more pain today. She reports some soreness last night but when she woke up this morning sshe had significant pain into her neck and posterior shoulder. She flet better after last visit.    Pertinent History  A-fib, stomach ulcers, frequent hospitilizations,    Diagnostic tests  nothing in the chart    Patient Stated Goals  to have less pain in her neck and back    Currently in Pain?  Yes    Pain Score  6     Pain Location  Shoulder    Pain Orientation  Right;Left    Pain Type  Chronic pain    Pain Onset  More than a month ago    Pain Frequency  Constant    Aggravating Factors   turing her head    Pain Relieving Factors  rest    Effect of Pain on Daily Activities  difficulty turning her head and using her left shoulder    Pain Onset  More than a month ago                       [facility] Adult PT Treatment/Exercise - 09/13/18 0001      Neck Exercises: Seated   Other Seated Exercise  scapular retraction, shoulder depression x 10 holding 5-10 seconds    Other Seated Exercise  light cervical rotation into pain  free range; reviewed relaxation technique       Neck Exercises: Supine   Other Supine Exercise  left shoulder ER in pain free range with cane x10       Modalities   Modalities  Moist Heat      Moist Heat Therapy   Number Minutes Moist Heat  10 Minutes    Moist Heat Location  Cervical      Manual Therapy   Manual Therapy  Soft tissue mobilization;Passive ROM    Soft tissue mobilization  left upper trap; trigger point release to cervical spine; peri-scpaular area and posterior shoulder     Passive ROM  Left GHJ into all planes as tolerated     Manual Traction  to cervical spine              PT Education - 09/13/18 1102    Education Details  importance of posture; technique with light ther-x    Person(s) Educated  Patient    Methods  Explanation;Demonstration;Tactile cues;Verbal cues    Comprehension  Verbalized understanding;Returned demonstration;Verbal cues required;Tactile cues required       PT Short Term Goals - 09/10/18 1235      PT SHORT TERM GOAL #1   Title  Patient will report 2/10 pain at worst with available movement    Time  3    Period  Weeks    Status  On-going      PT SHORT TERM GOAL #2   Title  Patient will increase bilateral cervical rotation by 10 degrees    Time  3    Period  Weeks    Status  New      PT SHORT TERM GOAL #3   Title  Patient will restate the importance of proper posture while using her phone.    Time  3    Period  Weeks    Status  New        PT Long Term Goals - 08/30/18 1531      PT LONG TERM GOAL #1   Title  Patient will report no pain with the limited function of her left shoulder    Time  6    Period  Weeks    Status  New    Target Date  10/11/18      PT LONG TERM GOAL #2   Title  Patient will increase bilateral cervical rotation to 86 degrees    Time  6    Period  Weeks    Status  New    Target Date  10/11/18      PT LONG TERM GOAL #3   Title  Patient will sleep thorugh the night without increased neck and  shoulder pain    Time  6    Period  Weeks    Status  New    Target Date  10/11/18            Plan - 09/13/18 1237    Clinical Impression Statement  Therapy focused on manual therapy today. She had significant spasming into her upper trap and psoterior shoulder. She had improved pain with manual therapy. Therapy reviewed a few things to do over the next couple of days to continue to reduce her pain and maintain her motion. At some point we will have to work on strengthening but it is difficult with her pain levels increasing.    Comorbidities  multiple surgies on left shoulder; A-fib, RA, Reynauds, multi-joint OA, pacemaker    Examination-Activity Limitations  Locomotion Level;Reach Overhead;Carry;Lift    Examination-Participation Restrictions  Cleaning;Meal Prep;Community Activity    Stability/Clinical Decision Making  Evolving/Moderate complexity    Clinical Decision Making  Moderate    Rehab Potential  Good    PT Frequency  2x / week    PT Duration  6 weeks    PT Treatment/Interventions  ADLs/Self Care Home Management;Cryotherapy;Electrical Stimulation;Iontophoresis 4mg /ml Dexamethasone;Moist Heat;Ultrasound;DME Instruction;Gait training;Stair training;Functional mobility training;Therapeutic activities;Therapeutic exercise;Neuromuscular re-education;Patient/family education;Manual techniques;Passive  range of motion;Taping    PT Next Visit Plan  soft tissue mobilization to upper traps and posterior shoulder; Genlte shoulder ROM; scpualr strengthening; shoiulder strengthening in available ranges; Limited modalities 2nd to reynauds and pacemaker, pustural correction; consider decompression position    PT Home Exercise Plan  cerivcal rotation; scpa retraction, left upper trap stretch, seated short arc flx & abd, supine chest press, row anchored       Patient will benefit from skilled therapeutic intervention in order to improve the following deficits and impairments:  Pain, Decreased  mobility, Decreased strength, Decreased activity tolerance, Decreased endurance, Impaired UE functional use, Increased fascial restricitons, Impaired tone  Visit Diagnosis: Chronic left shoulder pain  Cervicalgia  Other muscle spasm     Problem List Patient Active Problem List   Diagnosis Date Noted  . Fatigue 08/01/2018  . Left shoulder pain 06/30/2018  . Chronic right shoulder pain 06/14/2018  . Anemia due to chronic blood loss 05/07/2018  . Odynophagia 05/07/2018  . Protein-calorie malnutrition ([place]) 05/07/2018  . Absolute anemia   . Cardiac pacemaker in situ 05/06/2018  . History of anastomosis and bypass hemorrhage 05/06/2018  . Atrial fibrillation with rapid ventricular response ([place])   . Dehydration   . Bilious vomiting with nausea   . Gastrojejunal ulcer   . Acute abdominal pain 05/05/2018  . Rheumatoid arthritis involving both hands ([place]) 09/27/2017  . Seasonal allergies 08/07/2017  . Primary osteoarthritis of right knee 06/02/2017  . Osteoarthritis of right knee 05/30/2017  . Primary osteoarthritis of left hip 02/24/2017  . AAA (abdominal aortic aneurysm) without rupture ([place]) 12/21/2016  . Chronic systolic heart failure ([place]) 12/14/2016  . Atrial fibrillation ([place]) 11/22/2016  . Peripheral neuropathy 11/22/2016  . GERD (gastroesophageal reflux disease) 11/22/2016  . Abdominal pain 11/18/2016  . 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 Parasomnia 11/18/2016    Carney Living 09/13/2018, 1:34 PM  [facility] [address] [place], [place], [address] Phone: [phone]   Fax:  [phone]  Name: Deanna Spencer MRN: [id number] Date of Birth: [birth year]

## 2018-09-14 ENCOUNTER — Ambulatory Visit: Payer: PRIVATE HEALTH INSURANCE

## 2018-09-14 ENCOUNTER — Ambulatory Visit: Payer: Medicare Other | Admitting: Family Medicine

## 2018-09-14 ENCOUNTER — Encounter

## 2018-09-17 ENCOUNTER — Ambulatory Visit: Payer: Self-pay

## 2018-09-18 ENCOUNTER — Other Ambulatory Visit: Payer: Self-pay

## 2018-09-18 ENCOUNTER — Telehealth: Payer: Self-pay

## 2018-09-18 ENCOUNTER — Ambulatory Visit (INDEPENDENT_AMBULATORY_CARE_PROVIDER_SITE_OTHER): Payer: Medicare Other

## 2018-09-18 DIAGNOSIS — I10 Essential (primary) hypertension: Secondary | ICD-10-CM

## 2018-09-18 DIAGNOSIS — I429 Cardiomyopathy, unspecified: Secondary | ICD-10-CM

## 2018-09-18 DIAGNOSIS — I4821 Permanent atrial fibrillation: Secondary | ICD-10-CM

## 2018-09-18 MED ORDER — ROPINIROLE HCL 0.5 MG PO TABS: 0.5000 mg | ORAL_TABLET | Freq: Three times a day (TID) | ORAL | 0 refills | Status: DC

## 2018-09-18 NOTE — Telephone Encounter (Signed)
Left message to return call. October hospital schedule open for 10-30-18 (Tuesday)

## 2018-09-19 ENCOUNTER — Other Ambulatory Visit: Payer: Self-pay

## 2018-09-19 ENCOUNTER — Ambulatory Visit: Payer: Medicare Other | Admitting: Physical Therapy

## 2018-09-19 ENCOUNTER — Encounter: Payer: Self-pay | Admitting: Physical Therapy

## 2018-09-19 ENCOUNTER — Ambulatory Visit (INDEPENDENT_AMBULATORY_CARE_PROVIDER_SITE_OTHER): Payer: Medicare Other | Admitting: Family Medicine

## 2018-09-19 ENCOUNTER — Encounter: Payer: Self-pay | Admitting: Family Medicine

## 2018-09-19 VITALS — BP 128/80 | HR 87

## 2018-09-19 DIAGNOSIS — Z8669 Personal history of other diseases of the nervous system and sense organs: Secondary | ICD-10-CM

## 2018-09-19 DIAGNOSIS — K289 Gastrojejunal ulcer, unspecified as acute or chronic, without hemorrhage or perforation: Secondary | ICD-10-CM

## 2018-09-19 DIAGNOSIS — M62838 Other muscle spasm: Secondary | ICD-10-CM

## 2018-09-19 DIAGNOSIS — M25512 Pain in left shoulder: Secondary | ICD-10-CM

## 2018-09-19 DIAGNOSIS — M542 Cervicalgia: Secondary | ICD-10-CM | POA: Diagnosis not present

## 2018-09-19 DIAGNOSIS — K529 Noninfective gastroenteritis and colitis, unspecified: Secondary | ICD-10-CM

## 2018-09-19 DIAGNOSIS — R1032 Left lower quadrant pain: Secondary | ICD-10-CM

## 2018-09-19 DIAGNOSIS — G8929 Other chronic pain: Secondary | ICD-10-CM

## 2018-09-19 DIAGNOSIS — F320 Major depressive disorder, single episode, mild: Secondary | ICD-10-CM

## 2018-09-19 DIAGNOSIS — H9190 Unspecified hearing loss, unspecified ear: Secondary | ICD-10-CM | POA: Diagnosis not present

## 2018-09-19 DIAGNOSIS — R634 Abnormal weight loss: Secondary | ICD-10-CM

## 2018-09-19 NOTE — Patient Instructions (Addendum)
It was great to see you today! Thank you for letting me participate in your care!  Today, we discussed your continued vision changes and I want you to reach out to your previous eye doctor and get seen by her. I will follow along with GI for your stomach ulcers and I am glad they are checking up on you about that.  For your arthritis please make sure you follow up with rheumatology.  Fill out your paperwork for [facility] and we will see you in November  Be well, Harolyn Rutherford, DO PGY-3, [facility]

## 2018-09-19 NOTE — Therapy (Signed)
[place], [place], [address] Phone: [phone]   Fax:  [phone]  Physical Therapy Treatment  Patient Details  Name: Deanna Spencer MRN: [id number] Date of Birth: 1940-03-10 Referring Provider (PT): Dr Ruthann Cancer Chambliss/ Nuala Alpha DO    Encounter Date: 09/19/2018  PT End of Session - 09/19/18 1148    Visit Number  5    Number of Visits  12    Date for PT Re-Evaluation  10/11/18    Authorization Type  Medicare    PT Start Time  1105    PT Stop Time  1147    PT Time Calculation (min)  42 min    Activity Tolerance  Patient tolerated treatment well    Behavior During Therapy  [facility] for tasks assessed/performed       Past Medical History:  Diagnosis Date  . AAA (abdominal aortic aneurysm) ([place])    3.5cm by CT 11/29/16  . AAA (abdominal aortic aneurysm) without rupture ([place]) 12/21/2016   Stable 3cm 4/20, needs f/u imaging ~4/23  . Acute abdominal pain 05/05/2018  . Anemia   . Atrial fibrillation ([place]) 11/22/2016  . Bee sting 06/28/2017  . Cerumen impaction 06/28/2017  . Childhood asthma    connected to allergies  . Chronic lower back pain   . Chronic systolic heart failure ([place]) 12/14/2016  . Diplopia 09/27/2017  . Diverticulosis   . Duodenal ulcer   . Dyspnea   . Dysrhythmia    a fib  . GERD (gastroesophageal reflux disease)   . GI bleed    "have had both upper and lower GIB"  . GI bleed 05/07/2018  . Gout   . Heart attack ([place]) 2017  . Heart murmur   . History of anastomosis and bypass hemorrhage 05/06/2018  . History of blood transfusion 2017-2018   "related to bleeding ulcers; 47 units PRBC; 2U plasma" (11/23/2016)  . History of duodenal ulcer 11/18/2016  . History of heart attack 11/18/2016  . History of hiatal hernia   . Hx of blood clots    in heart  . IBS (irritable bowel syndrome)   . Liver lesion 08/07/2017  . Neuropathy   . Osteoarthritis   . Osteoarthritis of right knee  05/30/2017  . Osteoporosis   . Other chronic pain 11/18/2016  . Parasomnia 11/18/2016  . Perforated viscus 11/22/2016  . Peripheral neuropathy   . 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 Presence of Watchman left atrial appendage closure device   . Primary osteoarthritis of left hip 02/24/2017  . 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 Primary osteoarthritis of right knee 06/02/2017  . Raynaud disease   . Rheumatoid arteritis ([place])   . Rheumatoid arthritis involving both hands ([place]) 09/27/2017  . S/P exploratory laparotomy 11/22/2016  . Seasonal allergies 08/07/2017  . 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 SSS (sick sinus syndrome) ([place])    a. s/p Medtronic PPM placement in 2017  . Stomach ulcer   . Vision changes 08/07/2017    Past Surgical History:  Procedure Laterality Date  . ABDOMINAL HYSTERECTOMY    . APPENDECTOMY    . ARTHROPLASTY Right 06/02/2017   RIGHT PATELLA-FEMORAL ARTHROPLASTY, with placement of a cemented 29 mm Stryker asymmetric triathlon patellar button, revision of the tibial polyethylene bearing.  Marland Kitchen BIOPSY  05/06/2018   Procedure: BIOPSY;  Surgeon: Gatha Mayer, MD;  Location: [place];  Service: Endoscopy;;  . CARPAL TUNNEL  RELEASE Bilateral   . CATARACT EXTRACTION W/ INTRAOCULAR LENS  IMPLANT, BILATERAL Bilateral   . CHOLECYSTECTOMY OPEN    . DILATION AND CURETTAGE OF UTERUS    . ESOPHAGOGASTRODUODENOSCOPY (EGD) WITH PROPOFOL N/A 05/06/2018   Procedure: ESOPHAGOGASTRODUODENOSCOPY (EGD) WITH PROPOFOL;  Surgeon: Gatha Mayer, MD;  Location: [place];  Service: Endoscopy;  Laterality: N/A;  . FOOT FUSION Right   . INGUINAL HERNIA REPAIR Right X 2  . INSERT / REPLACE / REMOVE PACEMAKER  2017   medtronic  . JOINT REPLACEMENT    . LAPAROTOMY N/A 11/21/2016   Procedure: EXPLORATORY LAPAROTOMY, REVISION OF  GASTROJEJUNOSTOMY, ESOPHAGOGASTROSCOPY;  Surgeon: Clovis Riley, MD;  Location: [place];  Service:  General;  Laterality: N/A;  . LEFT ATRIAL APPENDAGE OCCLUSION  2018   placed  via groin  . LUMBAR FUSION  X 2  . PATELLA-FEMORAL ARTHROPLASTY Right 06/02/2017   Procedure: RIGHT PATELLA-FEMORAL ARTHROPLASTY;  Surgeon: Frederik Pear, MD;  Location: [place];  Service: Orthopedics;  Laterality: Right;  . REVERSE SHOULDER ARTHROPLASTY Left    reverse shoulder replacement  . ROUX-EN-Y GASTRIC BYPASS  2005  . ROUX-EN-Y PROCEDURE  20017 X 2   "revisions"  . TONSILLECTOMY AND ADENOIDECTOMY    . TOTAL HIP ARTHROPLASTY Right 02/24/2017   Procedure: TOTAL HIP ARTHROPLASTY;  Surgeon: Earlie Server, MD;  Location: [place];  Service: Orthopedics;  Laterality: Right;  . TOTAL KNEE ARTHROPLASTY Bilateral   . TOTAL SHOULDER ARTHROPLASTY Bilateral   . TUBAL LIGATION      There were no vitals filed for this visit.  Subjective Assessment - 09/19/18 1107    Subjective  Shoulder is doing better but is still sore. I moved into an apartment which has been horrible. upper trap is very sore. I have been trying to hang pictures.    Patient Stated Goals  to have less pain in her neck and back    Currently in Pain?  Yes    Pain Score  5     Pain Location  Shoulder    Pain Orientation  Left    Pain Descriptors / Indicators  Sore                       [facility] Adult PT Treatment/Exercise - 09/19/18 0001      Shoulder Exercises: Supine   Flexion  AAROM;Left    ABduction Limitations  small range with elbow bent      Shoulder Exercises: Seated   Abduction  AAROM;Left    Other Seated Exercises  biceps curls with scap retraction    Other Seated Exercises  seated shoulder extension, seated triceps kicks resisted by PT      Shoulder Exercises: Sidelying   External Rotation  20 reps;Left      Manual Therapy   Soft tissue mobilization  bilateral upper trap               PT Short Term Goals - 09/10/18 1235      PT SHORT TERM GOAL #1   Title  Patient will report 2/10 pain at worst with  available movement    Time  3    Period  Weeks    Status  On-going      PT SHORT TERM GOAL #2   Title  Patient will increase bilateral cervical rotation by 10 degrees    Time  3    Period  Weeks    Status  New  PT SHORT TERM GOAL #3   Title  Patient will restate the importance of proper posture while using her phone.    Time  3    Period  Weeks    Status  New        PT Long Term Goals - 08/30/18 1531      PT LONG TERM GOAL #1   Title  Patient will report no pain with the limited function of her left shoulder    Time  6    Period  Weeks    Status  New    Target Date  10/11/18      PT LONG TERM GOAL #2   Title  Patient will increase bilateral cervical rotation to 86 degrees    Time  6    Period  Weeks    Status  New    Target Date  10/11/18      PT LONG TERM GOAL #3   Title  Patient will sleep thorugh the night without increased neck and shoulder pain    Time  6    Period  Weeks    Status  New    Target Date  10/11/18            Plan - 09/19/18 1150    Clinical Impression Statement  Soreness in bil upper traps. Discussed how reverse TSA can contribute to the use of upper traps. Advised against handing pictures on her own.    PT Treatment/Interventions  ADLs/Self Care Home Management;Cryotherapy;Electrical Stimulation;Iontophoresis 4mg /ml Dexamethasone;Moist Heat;Ultrasound;DME Instruction;Gait training;Stair training;Functional mobility training;Therapeutic activities;Therapeutic exercise;Neuromuscular re-education;Patient/family education;Manual techniques;Passive range of motion;Taping    PT Next Visit Plan  isometrics for deltoid Limited modalities 2nd to reynauds and pacemaker, pustural correction; consider decompression position    PT Home Exercise Plan  cerivcal rotation; scpa retraction, left upper trap stretch, seated short arc flx & abd, supine chest press, row anchored, SL ER, biceps curls    Consulted and Agree with Plan of Care  Patient        Patient will benefit from skilled therapeutic intervention in order to improve the following deficits and impairments:  Pain, Decreased mobility, Decreased strength, Decreased activity tolerance, Decreased endurance, Impaired UE functional use, Increased fascial restricitons, Impaired tone  Visit Diagnosis: Chronic left shoulder pain  Cervicalgia  Other muscle spasm     Problem List Patient Active Problem List   Diagnosis Date Noted  . Fatigue 08/01/2018  . Left shoulder pain 06/30/2018  . Chronic right shoulder pain 06/14/2018  . Anemia due to chronic blood loss 05/07/2018  . Odynophagia 05/07/2018  . Protein-calorie malnutrition ([place]) 05/07/2018  . Absolute anemia   . Cardiac pacemaker in situ 05/06/2018  . History of anastomosis and bypass hemorrhage 05/06/2018  . Atrial fibrillation with rapid ventricular response ([place])   . Dehydration   . Bilious vomiting with nausea   . Gastrojejunal ulcer   . Acute abdominal pain 05/05/2018  . Rheumatoid arthritis involving both hands ([place]) 09/27/2017  . Seasonal allergies 08/07/2017  . Primary osteoarthritis of right knee 06/02/2017  . Osteoarthritis of right knee 05/30/2017  . Primary osteoarthritis of left hip 02/24/2017  . AAA (abdominal aortic aneurysm) without rupture ([place]) 12/21/2016  . Chronic systolic heart failure ([place]) 12/14/2016  . Atrial fibrillation ([place]) 11/22/2016  . Peripheral neuropathy 11/22/2016  . GERD (gastroesophageal reflux disease) 11/22/2016  . Abdominal pain 11/18/2016  . 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 Parasomnia 11/18/2016  Johnna Bollier C. Chukwudi Ewen PT, DPT 09/19/18 11:53 AM   [place] [facility] [address] [place], [place], [address] Phone: [phone]   Fax:  [phone]  Name: LIBBI RATHORE MRN: [id number] Date of Birth: [birth year]

## 2018-09-19 NOTE — Progress Notes (Signed)
     Subjective: Chief Complaint  Patient presents with  . Follow-up    HPI: Deanna Spencer is a 78 y.o. presenting to clinic today to discuss the following:  Depression Follow Up Patient states she feels much better and improved from her last visit. No SI or HI at this visit. She reports things between her daughter and her are much improved and this has helped. No side effects from Sertraline reported and she is taking it as prescribed. Patient reports improved appetite, improved sleep, and improved energy.  Gastric/Duodenal Ulcers Chronic. Improved. Patient is having EDG and Colonoscopy perfromed on Oct 20th with [facility]. She states her stomach pains have been infrequent and eating small amounts throughout the day seems to help keep the pain away. No bleeding, no nausea or vomiting.   Blurry Vision Chronic. Stable, but patient failed to follow up with opthalmology last year and now wants to have the possible surgery that was discussed in 2019. Discussed with patient she will need to contact the opthalmology and schedule an appointment with them. She continues to have blurry vision when taking off her glasses.  Decreased Hearing Patient states she feels like she has "fluid in her ears". She wears hearing aids and states they have become less effective over the past few months. No ear pain, no ringing in her ears just not hearing as well. No fevers, chills, or pain in the jaw.  ROS noted in HPI.    Social History   Tobacco Use  Smoking Status Never Smoker  Smokeless Tobacco Never Used   Objective: BP 128/80   Pulse 87   SpO2 99%  Vitals and nursing notes reviewed  Physical Exam Gen: Alert and Oriented x 3, NAD HEENT: Normocephalic, atraumatic, PERRLA, EOMI CV: RRR, no murmurs, normal S1, S2 split Resp: CTAB, no wheezing, rales, or rhonchi, comfortable work of breathing Ext: no clubbing, cyanosis; trace edema bilaterally Skin: warm, dry, intact, no rashes   Assessment/Plan:  Depression, major, single episode, mild (HCC) Acute, improved. - Cont Sertraline 50mg , no change today  Gastrojejunal ulcer Has follow up with [facility] for EDG and Colonoscopy on 10/30/2018. 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 Was seen and evaluated by ophthalmology in 2019 and they suggested surgery to correct it but she did not follow up. - Patient instructed to contact them again as she seems more open to surgery now    PATIENT EDUCATION PROVIDED: See AVS    Diagnosis and plan along with any newly prescribed medication(s) were discussed in detail with this patient today. The patient verbalized understanding and agreed with the plan. Patient advised if symptoms worsen return to clinic or ER.    Orders Placed This Encounter  Procedures  . Ambulatory referral to Audiology    Referral Priority:   Routine    Referral Type:   Audiology Exam    Referral Reason:   Specialty Services Required    Number of Visits Requested:   1    No orders of the defined types were placed in this encounter.  Deanna Rutherford, DO 09/19/2018, 10:22 AM PGY-3 [place]

## 2018-09-19 NOTE — Patient Outreach (Signed)
[facility]) Care Management  09/19/2018  Deanna Spencer October 22, 1940 [id number]    Outreach to the patient for initial assess. The patient answered the phone but she was in an elevator and could not talk at time.  She states that she will give me a call back.   Plan: RN Health Coach has made several attempts to outreach the patient.  If no response to calls and letter in ten business days [place] will proceed with case closure.    Lazaro Arms RN, BSN, [place] Direct Dial:  [phone]  Fax: [phone]

## 2018-09-19 NOTE — Telephone Encounter (Signed)
Pt has been scheduled for 10-30-18 at [place] @1030  am

## 2018-09-19 NOTE — Progress Notes (Deleted)
Patient referred by Nuala Alpha, DO for atrial fibrillation  Subjective:   Deanna Spencer, female    DOB: 1940/03/25, 78 y.o.   MRN: [id number]  *** No chief complaint on file.   HPI  78 y.o. Caucasian female with complex medical history including permanent atrial fibrillation, s/p Watchman LAAC device, h/o recurrent GI bleeding with ulcers following gastric bypass surgery, h/o multilple blood transfusions, exploratory laporotomy with revision of gastrojejunostomy, esophagogastroscopy (11/2016), rheumatoid arthritis, multiple orthopedic issues.   Patient has previously been seen by [facility] heartcare and Dr. Doylene Canard. She is now here to establish cardiac care with [place].   Patient was born in [place], and moved to [place]. [place] after marriage where she lived most of her life. She now lives in [place] to be close to her daughter. She tells me that she had a heart attakc in 2017 while in [place], and was found to have Afib, underwent Watchman placement due to her inability to be on anticoagulation, and also had a Medtronic pacemaker placement. I do not have prior medical records of these hospitalizations in [place] (Not available in [place]) She has had multiple hospitalizations here with GI bleeding. Echocardiogram in 11/2016 showed EF 30-35%, with no ischemia on stress test in 01/2017.  She reports "flutter sensation" in her chest when she walks, but denies chest pain, shortness of breath, leg edema, orthopnea, PND, TIA/syncope. She denies any presyncopal or syncopal symptoms.  Past Medical History:  Diagnosis Date  . AAA (abdominal aortic aneurysm) ([place])    3.5cm by CT 11/29/16  . AAA (abdominal aortic aneurysm) without rupture ([place]) 12/21/2016   Stable 3cm 4/20, needs f/u imaging ~4/23  . Acute abdominal pain 05/05/2018  . Anemia   . Atrial fibrillation ([place]) 11/22/2016  . Bee sting 06/28/2017  . Cerumen impaction 06/28/2017  . Childhood asthma    connected to allergies   . Chronic lower back pain   . Chronic systolic heart failure ([place]) 12/14/2016  . Diplopia 09/27/2017  . Diverticulosis   . Duodenal ulcer   . Dyspnea   . Dysrhythmia    a fib  . GERD (gastroesophageal reflux disease)   . GI bleed    "have had both upper and lower GIB"  . GI bleed 05/07/2018  . Gout   . Heart attack ([place]) 2017  . Heart murmur   . History of anastomosis and bypass hemorrhage 05/06/2018  . History of blood transfusion 2017-2018   "related to bleeding ulcers; 47 units PRBC; 2U plasma" (11/23/2016)  . History of duodenal ulcer 11/18/2016  . History of heart attack 11/18/2016  . History of hiatal hernia   . Hx of blood clots    in heart  . IBS (irritable bowel syndrome)   . Liver lesion 08/07/2017  . Neuropathy   . Osteoarthritis   . Osteoarthritis of right knee 05/30/2017  . Osteoporosis   . Other chronic pain 11/18/2016  . Parasomnia 11/18/2016  . Perforated viscus 11/22/2016  . Peripheral neuropathy   . Presence of permanent cardiac pacemaker 10/2015   medtronic; Serial # [id number]  . Presence of Watchman left atrial appendage closure device   . Primary osteoarthritis of left hip 02/24/2017  . 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 Primary osteoarthritis of right knee 06/02/2017  . Raynaud disease   . Rheumatoid arteritis ([place])   . Rheumatoid arthritis involving both hands ([place]) 09/27/2017  . S/P exploratory laparotomy 11/22/2016  . Seasonal allergies 08/07/2017  . Spastic  bladder 11/18/2016  . SSS (sick sinus syndrome) ([place])    a. s/p Medtronic PPM placement in 2017  . Stomach ulcer   . Vision changes 08/07/2017     Past Surgical History:  Procedure Laterality Date  . ABDOMINAL HYSTERECTOMY    . APPENDECTOMY    . ARTHROPLASTY Right 06/02/2017   RIGHT PATELLA-FEMORAL ARTHROPLASTY, with placement of a cemented 29 mm Stryker asymmetric triathlon patellar button, revision of the tibial polyethylene bearing.  Marland Kitchen BIOPSY  05/06/2018   Procedure: BIOPSY;   Surgeon: Gatha Mayer, MD;  Location: [facility];  Service: Endoscopy;;  . CARPAL TUNNEL RELEASE Bilateral   . CATARACT EXTRACTION W/ INTRAOCULAR LENS  IMPLANT, BILATERAL Bilateral   . CHOLECYSTECTOMY OPEN    . DILATION AND CURETTAGE OF UTERUS    . ESOPHAGOGASTRODUODENOSCOPY (EGD) WITH PROPOFOL N/A 05/06/2018   Procedure: ESOPHAGOGASTRODUODENOSCOPY (EGD) WITH PROPOFOL;  Surgeon: Gatha Mayer, MD;  Location: [facility];  Service: Endoscopy;  Laterality: N/A;  . FOOT FUSION Right   . INGUINAL HERNIA REPAIR Right X 2  . INSERT / REPLACE / REMOVE PACEMAKER  2017   medtronic  . JOINT REPLACEMENT    . LAPAROTOMY N/A 11/21/2016   Procedure: EXPLORATORY LAPAROTOMY, REVISION OF  GASTROJEJUNOSTOMY, ESOPHAGOGASTROSCOPY;  Surgeon: Clovis Riley, MD;  Location: [facility];  Service: General;  Laterality: N/A;  . LEFT ATRIAL APPENDAGE OCCLUSION  2018   placed  via groin  . LUMBAR FUSION  X 2  . PATELLA-FEMORAL ARTHROPLASTY Right 06/02/2017   Procedure: RIGHT PATELLA-FEMORAL ARTHROPLASTY;  Surgeon: Frederik Pear, MD;  Location: [facility];  Service: Orthopedics;  Laterality: Right;  . REVERSE SHOULDER ARTHROPLASTY Left    reverse shoulder replacement  . ROUX-EN-Y GASTRIC BYPASS  2005  . ROUX-EN-Y PROCEDURE  20017 X 2   "revisions"  . TONSILLECTOMY AND ADENOIDECTOMY    . TOTAL HIP ARTHROPLASTY Right 02/24/2017   Procedure: TOTAL HIP ARTHROPLASTY;  Surgeon: Earlie Server, MD;  Location: [facility];  Service: Orthopedics;  Laterality: Right;  . TOTAL KNEE ARTHROPLASTY Bilateral   . TOTAL SHOULDER ARTHROPLASTY Bilateral   . TUBAL LIGATION       Social History   Socioeconomic History  . Marital status: Widowed    Spouse name: Not on file  . Number of children: Not on file  . Years of education: Not on file  . Highest education level: Not on file  Occupational History  . Not on file  Social Needs  . Financial resource strain: Somewhat hard  . Food insecurity    Worry: Never true    Inability:  Never true  . Transportation needs    Medical: No    Non-medical: No  Tobacco Use  . Smoking status: Never Smoker  . Smokeless tobacco: Never Used  Substance and Sexual Activity  . Alcohol use: No    Frequency: Never    Comment: very rare  . Drug use: No  . Sexual activity: Not Currently  Lifestyle  . Physical activity    Days per week: 2 days    Minutes per session: 20 min  . Stress: Not on file  Relationships  . Social connections    Talks on phone: More than three times a week    Gets together: More than three times a week    Attends religious service: Not on file    Active member of club or organization: Yes    Attends meetings of clubs or organizations: More than 4 times per year  Relationship status: Widowed  . Intimate partner violence    Fear of current or ex partner: No    Emotionally abused: No    Physically abused: No    Forced sexual activity: No  Other Topics Concern  . Not on file  Social History Narrative  . Not on file     Family History  Problem Relation Age of Onset  . Heart disease Mother   . Liver disease Father   . Ulcers Father   . Prostate cancer Brother   . Colon cancer Neg Hx      Current Outpatient Medications on File Prior to Visit  Medication Sig Dispense Refill  . acetaminophen (TYLENOL) 500 MG tablet Take 1,000 mg every 6 (six) hours as needed by mouth (pain).     . calcium citrate (CALCITRATE - DOSED IN MG ELEMENTAL CALCIUM) 950 MG tablet Take 2.5 tablets (500 mg of elemental calcium total) by mouth 3 (three) times daily. 200 tablet 0  . Calcium Citrate 200 MG TABS Take 2.5 tablets (500 mg total) by mouth daily. 225 tablet 3  . COLCRYS 0.6 MG tablet TAKE 1 TABLET (0.6 MG TOTAL) BY MOUTH 2 (TWO) TIMES DAILY AS NEEDED (GOUT ATTACKS). 30 tablet 0  . famotidine (PEPCID) 40 MG tablet Take 1 tablet (40 mg total) by mouth at bedtime. 90 tablet 3  . gabapentin (NEURONTIN) 300 MG capsule Take 1 capsule (300 mg total) by mouth 3 (three)  times daily. 90 capsule 5  . lactose free nutrition (BOOST PLUS) LIQD Take 237 mLs by mouth 3 (three) times daily with meals.  0  . metoprolol succinate (TOPROL-XL) 100 MG 24 hr tablet Take 1 tablet (100 mg total) by mouth at bedtime. Take with or immediately following a meal. 90 tablet 3  . multivitamin (PROSIGHT) TABS tablet Take 1 tablet by mouth 2 (two) times a day. 60 each 0  . ondansetron (ZOFRAN ODT) 4 MG disintegrating tablet Take 1 tablet (4 mg total) by mouth every 8 (eight) hours as needed for nausea or vomiting. 20 tablet 0  . pantoprazole (PROTONIX) 40 MG tablet Take 1 tablet (40 mg total) by mouth 2 (two) times daily. 60 tablet 2  . polyethylene glycol (MIRALAX / GLYCOLAX) 17 g packet Take 17 g by mouth daily. 14 each 0  . rOPINIRole (REQUIP) 0.5 MG tablet Take 1 tablet (0.5 mg total) by mouth 3 (three) times daily. 90 tablet 0  . sertraline (ZOLOFT) 50 MG tablet Take 1 tablet (50 mg total) by mouth daily. 30 tablet 3  . sucralfate (CARAFATE) 1 g tablet Take 1 tablet (1 g total) by mouth every 6 (six) hours. 360 tablet 3  . tolterodine (DETROL LA) 4 MG 24 hr capsule Take 1 capsule (4 mg total) by mouth daily. 30 capsule 2   No current facility-administered medications on file prior to visit.     Cardiovascular studies:  Echocardiogram 09/18/2018: Left ventricle cavity is normal in size. Moderate concentric hypertrophy of the left ventricle. Mild global hypokinesis with mildly depressed LV systolic function with visual EF 40-45%.  Left atrial cavity is severely dilated at 88 cm2/m2.  S/p LAA closure.  Trace aortic regurgitation. Mild mitral valve leaflet thickening. Moderate to severe mitral regurgitation. Wall-impinging MR jet color flow area. Mild tricuspid regurgitation. Estimated pulmonary artery systolic pressure is 29 mmHg. IVC is dilated with blunted respiratory response. Estimated RA pressure 10-15 mmHg.   EKG 08/17/2018: Atrial fibrillation Ventricular paced rhythm   Nuclear stress test 02/03/2017:  The study is normal.  This is a low risk study.  The patient was in atrial fibrillation so gating was not possible. Therefore, we were not able to obtain any information regarding left ventricular function.    Recent labs: Results for NOAM, MAPLES (MRN [id number]) as of 08/17/2018 11:36  Ref. Range 08/01/2018 15:36  COMPREHENSIVE METABOLIC PANEL Unknown Rpt (A)  Sodium Latest Ref Range: 134 - 144 mmol/L 144  Potassium Latest Ref Range: 3.5 - 5.2 mmol/L 3.6  Chloride Latest Ref Range: 96 - 106 mmol/L 104  CO2 Latest Ref Range: 20 - 29 mmol/L 24  Glucose Latest Ref Range: 65 - 99 mg/dL 93  BUN Latest Ref Range: 8 - 27 mg/dL 14  Creatinine Latest Ref Range: 0.57 - 1.00 mg/dL 0.68  Calcium Latest Ref Range: 8.7 - 10.3 mg/dL 9.0  BUN/Creatinine Ratio Latest Ref Range: 12 - 28  21  Alkaline Phosphatase Latest Ref Range: 39 - 117 IU/L 83  Albumin Latest Ref Range: 3.7 - 4.7 g/dL 3.5 (L)  Albumin/Globulin Ratio Latest Ref Range: 1.2 - 2.2  1.3  AST Latest Ref Range: 0 - 40 IU/L 17  ALT Latest Ref Range: 0 - 32 IU/L 7  Total Protein Latest Ref Range: 6.0 - 8.5 g/dL 6.2  Total Bilirubin Latest Ref Range: 0.0 - 1.2 mg/dL <0.2  GFR, Est Non African American Latest Ref Range: >59 mL/min/1.73 85  GFR, Est African American Latest Ref Range: >59 mL/min/1.73 98   Results for RIKAYLA, GROAH (MRN [id number]) as of 08/17/2018 11:36  Ref. Range 08/01/2018 15:36  WBC Latest Ref Range: 3.4 - 10.8 x10E3/uL 8.6  RBC Latest Ref Range: 3.77 - 5.28 x10E6/uL 4.16  Hemoglobin Latest Ref Range: 11.1 - 15.9 g/dL 11.9  HCT Latest Ref Range: 34.0 - 46.6 % 38.2  MCV Latest Ref Range: 79 - 97 fL 92  MCH Latest Ref Range: 26.6 - 33.0 pg 28.6  MCHC Latest Ref Range: 31.5 - 35.7 g/dL 31.2 (L)  RDW Latest Ref Range: 11.7 - 15.4 % 14.4  Platelets Latest Ref Range: 150 - 450 x10E3/uL 457 (H)   Results for ESPN, WHRITENOUR (MRN [id number]) as of 08/17/2018 11:36  Ref. Range 08/01/2018  15:36  Glucose Latest Ref Range: 65 - 99 mg/dL 93  TSH Latest Ref Range: 0.450 - 4.500 uIU/mL 0.956  T4,Free(Direct) Latest Ref Range: 0.82 - 1.77 ng/dL 1.44     Review of Systems  Constitution: Negative for decreased appetite, malaise/fatigue, weight gain and weight loss.  HENT: Negative for congestion.   Eyes: Negative for visual disturbance.  Cardiovascular: Positive for palpitations. Negative for chest pain, dyspnea on exertion, leg swelling and syncope.  Respiratory: Negative for cough.   Endocrine: Negative for cold intolerance.  Hematologic/Lymphatic: Does not bruise/bleed easily.  Skin: Negative for itching and rash.  Musculoskeletal: Positive for arthritis and joint pain. Negative for myalgias.  Gastrointestinal: Negative for abdominal pain, nausea and vomiting.  Genitourinary: Negative for dysuria.  Neurological: Negative for dizziness and weakness.  Psychiatric/Behavioral: The patient is not nervous/anxious.   All other systems reviewed and are negative.      *** There were no vitals filed for this visit.  *** There is no height or weight on file to calculate BMI. There were no vitals filed for this visit.   Objective:   Physical Exam  Constitutional: She is oriented to person, place, and time. She appears well-developed and well-nourished. No distress.  HENT:  Head: Normocephalic and atraumatic.  Eyes:  Pupils are equal, round, and reactive to light. Conjunctivae are normal.  Neck: No JVD present.  Cardiovascular: Normal rate and intact distal pulses. An irregularly irregular rhythm present.  Murmur heard. High-pitched blowing holosystolic murmur is present with a grade of 3/6 at the apex. Pulmonary/Chest: Effort normal and breath sounds normal. She has no wheezes. She has no rales.  Abdominal: Soft. Bowel sounds are normal. There is no rebound.  Musculoskeletal:        General: No edema.  Lymphadenopathy:    She has no cervical adenopathy.  Neurological:  She is alert and oriented to person, place, and time. No cranial nerve deficit.  Skin: Skin is warm and dry.  Psychiatric: She has a normal mood and affect.  Nursing note and vitals reviewed.         Assessment & Recommendations:   78 y.o. Caucasian female with complex medical history including permanent atrial fibrillation, s/p Watchman LAAC device, h/o recurrent GI bleeding with ulcers following gastric bypass surgery, h/o multilple blood transfusions, exploratory laporotomy with revision of gastrojejunostomy, esophagogastroscopy (11/2016), rheumatoid arthritis, multiple orthopedic issues.   Atrial fibrillation: Permanent. Not a candidate for anticoagulation due to life threatening GI bleeding history. S/p LAA closure 2017. S/p pacemaker placement (2017), likely for sinus node dysfunction.   HFrEF: EF improved to 40-45%, albeit in the setting of moderate to severe MR. Mild TR.       Thank you for referring the patient to [place]. Please feel free to contact with any questions.  Nigel Mormon, MD [facility] Cardiovascular. [place] Pager: [phone] Office: [phone] If no answer Cell [phone]

## 2018-09-20 DIAGNOSIS — H9190 Unspecified hearing loss, unspecified ear: Secondary | ICD-10-CM | POA: Insufficient documentation

## 2018-09-20 DIAGNOSIS — F320 Major depressive disorder, single episode, mild: Secondary | ICD-10-CM | POA: Insufficient documentation

## 2018-09-20 DIAGNOSIS — Z8669 Personal history of other diseases of the nervous system and sense organs: Secondary | ICD-10-CM | POA: Insufficient documentation

## 2018-09-20 NOTE — Assessment & Plan Note (Signed)
Has follow up with [facility] for EDG and Colonoscopy on 10/30/2018. - F/u GI results of scopes - Patient is aware she still needs to be evaluated for possible surgery at tertiary care center and will discuss with [facility]

## 2018-09-20 NOTE — Assessment & Plan Note (Signed)
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 Was seen and evaluated by ophthalmology in 2019 and they suggested surgery to correct it but she did not follow up. - Patient instructed to contact them again as she seems more open to surgery now

## 2018-09-20 NOTE — Assessment & Plan Note (Signed)
Acute, improved. - Cont Sertraline 50mg , no change today

## 2018-09-20 NOTE — Assessment & Plan Note (Signed)
Patient endorsing hearing loss and hearing aids not working as well - Referral to Audiology

## 2018-09-21 ENCOUNTER — Ambulatory Visit: Payer: Medicare Other | Admitting: Physical Therapy

## 2018-09-21 ENCOUNTER — Other Ambulatory Visit: Payer: Self-pay | Admitting: Pharmacy Technician

## 2018-09-21 NOTE — Patient Outreach (Signed)
[facility]) Care Management  09/21/2018  Deanna Spencer 15-Sep-1940 [id number]  Successful outreach call placed to patient in regards to [employer] application for [place].  Spoke to patient, HIPAA identifiers verified.  Patient informed that she did indeed mail back the application at the end of August. Informed patient that I had not received it yet. Informed patient I would outreach her on Tuesday [id number] and if the application has not been received then I would mail the patient another one Patient was agreeable to this plan.  Will followup with patient in 2 business days.  Emeree Mahler P. Candus Braud, [place] Management [phone]

## 2018-09-25 ENCOUNTER — Other Ambulatory Visit: Payer: Self-pay

## 2018-09-25 ENCOUNTER — Ambulatory Visit: Payer: Medicare Other | Admitting: Physical Therapy

## 2018-09-25 ENCOUNTER — Other Ambulatory Visit: Payer: Self-pay | Admitting: Pharmacy Technician

## 2018-09-25 ENCOUNTER — Other Ambulatory Visit: Payer: Self-pay | Admitting: Pharmacist

## 2018-09-25 DIAGNOSIS — M542 Cervicalgia: Secondary | ICD-10-CM | POA: Diagnosis not present

## 2018-09-25 DIAGNOSIS — M62838 Other muscle spasm: Secondary | ICD-10-CM

## 2018-09-25 DIAGNOSIS — G8929 Other chronic pain: Secondary | ICD-10-CM

## 2018-09-25 DIAGNOSIS — M25512 Pain in left shoulder: Secondary | ICD-10-CM | POA: Diagnosis not present

## 2018-09-25 NOTE — Patient Outreach (Signed)
[facility]) Care Management  09/25/2018  Deanna Spencer 1940/10/18 [id number]  Patient was called to follow up on pill packs. HIPAA identifiers were obtained. Patient asked me to call her back later in the afternoon because she was on her way into a therapy appointment.  Elayne Guerin, PharmD, [place] Clinical Pharmacist [phone]  ADDENDUM  Called patient back later in the day. Unfortunately, she did not answer the phone.  HIPAA compliant message was left on her voicemail.  Plan: Await call back from patient. Call patient back in 10-14  business days.   Elayne Guerin, PharmD, [place] Clinical Pharmacist [phone]

## 2018-09-25 NOTE — Patient Outreach (Signed)
[facility]) Care Management  09/25/2018  Deanna Spencer [birth year] [id number]    Incoming call received from patient in regards to [employer] application for [place].  Spoke to patient, HIPAA identifiers verified.  Patient informed she is sure she mailed back the application because she can not find one in her home. Informed patient I have not received one back as of today. Patient inquired if I could mail her another application. Will place another application in the mail today.  Patient also inquired if I knew why [facility] was calling her. Informed patient that I did not know unless it was related to them packaging her medications. Informed patient to call [facility]. Patient informed she would.  Will followup with patient in 5-7 business days to inquire if application received.  Tristina Sahagian P. Khylen Riolo, [place] Management [phone]

## 2018-09-26 ENCOUNTER — Encounter: Payer: Self-pay | Admitting: Physical Therapy

## 2018-09-26 ENCOUNTER — Other Ambulatory Visit: Payer: Self-pay | Admitting: Pharmacist

## 2018-09-26 NOTE — Patient Outreach (Addendum)
[facility] Care Management  09/26/2018  Deanna Spencer 1940/09/29 [id number]   Patient called in with questions about her pill packs that she is receiving from [facility]. HIPAA identifiers were obtained. Patient said her gabapentin 300 mg instructions were incorrect. Patient said she is taking gabapentin 300 mg 2 capsules three times daily and her pack only has Gabapentin 300 mg 1 capsule three times daily in her pack.    After reviewing the patient's chart, Dr. Garlan Fillers sent a new Gabapentin [facility] to [facility] on 8/252020 with 1 capsule three times daily as the instructions.  Patient was instructed to speak with Dr. Arlana Pouch nurse about the instructions on the prescription.  Other than the gabapentin issue, patient reported her pill packs were working well.  Plan: Call patient back in 2-3 weeks.  Elayne Guerin, PharmD, [place] Clinical Pharmacist [phone]

## 2018-09-26 NOTE — Therapy (Signed)
[place], [place], [address] Phone: [phone]   Fax:  [phone]  Physical Therapy Treatment  Patient Details  Name: Deanna Spencer MRN: [id number] Date of Birth: [birth year] Referring Provider (PT): Dr Ruthann Cancer Chambliss/ Nuala Alpha DO    Encounter Date: 09/25/2018  PT End of Session - 09/26/18 0805    Visit Number  7    Number of Visits  12    Date for PT Re-Evaluation  10/11/18    Authorization Type  [employer]; progress note at 10 weeks    PT Start Time  1100    PT Stop Time  1140    PT Time Calculation (min)  40 min    Activity Tolerance  Patient tolerated treatment well    Behavior During Therapy  [facility] for tasks assessed/performed       Past Medical History:  Diagnosis Date  . AAA (abdominal aortic aneurysm) ([place])    3.5cm by CT 11/29/16  . AAA (abdominal aortic aneurysm) without rupture ([place]) 12/21/2016   Stable 3cm 4/20, needs f/u imaging ~4/23  . Acute abdominal pain 05/05/2018  . Anemia   . Atrial fibrillation ([place]) 11/22/2016  . Bee sting 06/28/2017  . Cerumen impaction 06/28/2017  . Childhood asthma    connected to allergies  . Chronic lower back pain   . Chronic systolic heart failure ([place]) 12/14/2016  . Diplopia 09/27/2017  . Diverticulosis   . Duodenal ulcer   . Dyspnea   . Dysrhythmia    a fib  . GERD (gastroesophageal reflux disease)   . GI bleed    "have had both upper and lower GIB"  . GI bleed 05/07/2018  . Gout   . Heart attack ([place]) 2017  . Heart murmur   . History of anastomosis and bypass hemorrhage 05/06/2018  . History of blood transfusion 2017-2018   "related to bleeding ulcers; 47 units PRBC; 2U plasma" (11/23/2016)  . History of duodenal ulcer 11/18/2016  . History of heart attack 11/18/2016  . History of hiatal hernia   . Hx of blood clots    in heart  . IBS (irritable bowel syndrome)   . Liver lesion 08/07/2017  . Neuropathy   . Osteoarthritis   .  Osteoarthritis of right knee 05/30/2017  . Osteoporosis   . Other chronic pain 11/18/2016  . Parasomnia 11/18/2016  . Perforated viscus 11/22/2016  . Peripheral neuropathy   . 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 Presence of Watchman left atrial appendage closure device   . Primary osteoarthritis of left hip 02/24/2017  . 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 Primary osteoarthritis of right knee 06/02/2017  . Raynaud disease   . Rheumatoid arteritis ([place])   . Rheumatoid arthritis involving both hands ([place]) 09/27/2017  . S/P exploratory laparotomy 11/22/2016  . Seasonal allergies 08/07/2017  . 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 SSS (sick sinus syndrome) ([place])    a. s/p Medtronic PPM placement in 2017  . Stomach ulcer   . Vision changes 08/07/2017    Past Surgical History:  Procedure Laterality Date  . ABDOMINAL HYSTERECTOMY    . APPENDECTOMY    . ARTHROPLASTY Right 06/02/2017   RIGHT PATELLA-FEMORAL ARTHROPLASTY, with placement of a cemented 29 mm Stryker asymmetric triathlon patellar button, revision of the tibial polyethylene bearing.  Marland Kitchen BIOPSY  05/06/2018   Procedure: BIOPSY;  Surgeon: Gatha Mayer, MD;  Location: [place];  Service:  Endoscopy;;  . CARPAL TUNNEL RELEASE Bilateral   . CATARACT EXTRACTION W/ INTRAOCULAR LENS  IMPLANT, BILATERAL Bilateral   . CHOLECYSTECTOMY OPEN    . DILATION AND CURETTAGE OF UTERUS    . ESOPHAGOGASTRODUODENOSCOPY (EGD) WITH PROPOFOL N/A 05/06/2018   Procedure: ESOPHAGOGASTRODUODENOSCOPY (EGD) WITH PROPOFOL;  Surgeon: Gatha Mayer, MD;  Location: [place];  Service: Endoscopy;  Laterality: N/A;  . FOOT FUSION Right   . INGUINAL HERNIA REPAIR Right X 2  . INSERT / REPLACE / REMOVE PACEMAKER  2017   medtronic  . JOINT REPLACEMENT    . LAPAROTOMY N/A 11/21/2016   Procedure: EXPLORATORY LAPAROTOMY, REVISION OF  GASTROJEJUNOSTOMY, ESOPHAGOGASTROSCOPY;  Surgeon: Clovis Riley, MD;   Location: [place];  Service: General;  Laterality: N/A;  . LEFT ATRIAL APPENDAGE OCCLUSION  2018   placed  via groin  . LUMBAR FUSION  X 2  . PATELLA-FEMORAL ARTHROPLASTY Right 06/02/2017   Procedure: RIGHT PATELLA-FEMORAL ARTHROPLASTY;  Surgeon: Frederik Pear, MD;  Location: [place];  Service: Orthopedics;  Laterality: Right;  . REVERSE SHOULDER ARTHROPLASTY Left    reverse shoulder replacement  . ROUX-EN-Y GASTRIC BYPASS  2005  . ROUX-EN-Y PROCEDURE  20017 X 2   "revisions"  . TONSILLECTOMY AND ADENOIDECTOMY    . TOTAL HIP ARTHROPLASTY Right 02/24/2017   Procedure: TOTAL HIP ARTHROPLASTY;  Surgeon: Earlie Server, MD;  Location: [place];  Service: Orthopedics;  Laterality: Right;  . TOTAL KNEE ARTHROPLASTY Bilateral   . TOTAL SHOULDER ARTHROPLASTY Bilateral   . TUBAL LIGATION      There were no vitals filed for this visit.  Subjective Assessment - 09/26/18 0803    Subjective  Patient reports her neck has been very sore the past few days. She is still having trouble sleeping at night.    Pertinent History  A-fib, stomach ulcers, frequent hospitilizations,    Diagnostic tests  nothing in the chart    Patient Stated Goals  to have less pain in her neck and back    Currently in Pain?  Yes    Pain Score  6     Pain Location  Neck    Pain Orientation  Left    Pain Descriptors / Indicators  Aching    Pain Type  Chronic pain    Pain Onset  More than a month ago    Pain Frequency  Constant    Aggravating Factors   turning her head    Pain Relieving Factors  rest    Effect of Pain on Daily Activities  difficulty turning her head                       [facility] Adult PT Treatment/Exercise - 09/26/18 0001      Neck Exercises: Standing   Other Standing Exercises  scap retraction 2x10 yellow      Shoulder Exercises: Supine   Flexion  AAROM;Left    Other Supine Exercises  bilateral ER to neutral x10 yellow       Manual Therapy   Soft tissue mobilization  left upper trap;  trigger point release to cervical spine; peri-scpaular area and posterior shoulder  using IASTYM  and using manual trigger point release    Passive ROM  Left GHJ into all planes as tolerated     Manual Traction  to cervical spine              PT Education - 09/26/18 0804    Education Details  reviewed  tehcnique with exercises    Person(s) Educated  Patient    Methods  Explanation;Demonstration;Tactile cues;Verbal cues    Comprehension  Verbalized understanding;Returned demonstration;Verbal cues required;Tactile cues required       PT Short Term Goals - 09/10/18 1235      PT SHORT TERM GOAL #1   Title  Patient will report 2/10 pain at worst with available movement    Time  3    Period  Weeks    Status  On-going      PT SHORT TERM GOAL #2   Title  Patient will increase bilateral cervical rotation by 10 degrees    Time  3    Period  Weeks    Status  New      PT SHORT TERM GOAL #3   Title  Patient will restate the importance of proper posture while using her phone.    Time  3    Period  Weeks    Status  New        PT Long Term Goals - 08/30/18 1531      PT LONG TERM GOAL #1   Title  Patient will report no pain with the limited function of her left shoulder    Time  6    Period  Weeks    Status  New    Target Date  10/11/18      PT LONG TERM GOAL #2   Title  Patient will increase bilateral cervical rotation to 86 degrees    Time  6    Period  Weeks    Status  New    Target Date  10/11/18      PT LONG TERM GOAL #3   Title  Patient will sleep thorugh the night without increased neck and shoulder pain    Time  6    Period  Weeks    Status  New    Target Date  10/11/18            Plan - 09/26/18 [id number]    Clinical Impression Statement  Patient continues to have significant spasming in the left side of her neck and upper trap. Therapy focused on mnaula therapy to reduce spasming. She reported improved pain after treatmentbut she is not having much  carryoiver between treatments. 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 We will continue to advance patient as tolerated.    Comorbidities  multiple surgies on left shoulder; A-fib, RA, Reynauds, multi-joint OA, pacemaker    Examination-Activity Limitations  Locomotion Level;Reach Overhead;Carry;Lift    Examination-Participation Restrictions  Cleaning;Meal Prep;Community Activity    Stability/Clinical Decision Making  Evolving/Moderate complexity    Clinical Decision Making  Moderate    Rehab Potential  Good    PT Frequency  2x / week    PT Duration  6 weeks    PT Treatment/Interventions  ADLs/Self Care Home Management;Cryotherapy;Electrical Stimulation;Iontophoresis 4mg /ml Dexamethasone;Moist Heat;Ultrasound;DME Instruction;Gait training;Stair training;Functional mobility training;Therapeutic activities;Therapeutic exercise;Neuromuscular re-education;Patient/family education;Manual techniques;Passive range of motion;Taping    PT Next Visit Plan  isometrics for deltoid Limited modalities 2nd to reynauds and pacemaker, pustural correction; consider decompression position    PT Home Exercise Plan  cerivcal rotation; scpa retraction, left upper trap stretch, seated short arc flx & abd, supine chest press, row anchored, SL ER, biceps curls    Consulted and Agree with Plan of Care  Patient       Patient will benefit from skilled therapeutic intervention in order to improve the following deficits  and impairments:  Pain, Decreased mobility, Decreased strength, Decreased activity tolerance, Decreased endurance, Impaired UE functional use, Increased fascial restricitons, Impaired tone  Visit Diagnosis: Cervicalgia  Chronic left shoulder pain  Other muscle spasm     Problem List Patient Active Problem List   Diagnosis Date Noted  . Depression, major, single episode, mild ([place]) 09/20/2018  . Hearing loss 09/20/2018  . History of double vision 09/20/2018  . Fatigue 08/01/2018  .  Left shoulder pain 06/30/2018  . Chronic right shoulder pain 06/14/2018  . Anemia due to chronic blood loss 05/07/2018  . Odynophagia 05/07/2018  . Protein-calorie malnutrition ([place]) 05/07/2018  . Absolute anemia   . Cardiac pacemaker in situ 05/06/2018  . History of anastomosis and bypass hemorrhage 05/06/2018  . Atrial fibrillation with rapid ventricular response ([place])   . Dehydration   . Bilious vomiting with nausea   . Gastrojejunal ulcer   . Acute abdominal pain 05/05/2018  . Rheumatoid arthritis involving both hands ([place]) 09/27/2017  . Seasonal allergies 08/07/2017  . Primary osteoarthritis of right knee 06/02/2017  . Osteoarthritis of right knee 05/30/2017  . Primary osteoarthritis of left hip 02/24/2017  . AAA (abdominal aortic aneurysm) without rupture ([place]) 12/21/2016  . Chronic systolic heart failure ([place]) 12/14/2016  . Atrial fibrillation ([place]) 11/22/2016  . Peripheral neuropathy 11/22/2016  . GERD (gastroesophageal reflux disease) 11/22/2016  . Abdominal pain 11/18/2016  . 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 Parasomnia 11/18/2016    Carney Living PT DPT  09/26/2018, 8:11 AM  [facility] [address] [place], [place], [address] Phone: [phone]   Fax:  [phone]  Name: Deanna Spencer MRN: [id number] Date of Birth: May 17, 1940

## 2018-09-27 ENCOUNTER — Ambulatory Visit: Payer: Self-pay | Admitting: Pharmacist

## 2018-09-27 ENCOUNTER — Encounter: Payer: Self-pay | Admitting: Physical Therapy

## 2018-09-27 ENCOUNTER — Ambulatory Visit: Payer: Medicare Other | Admitting: Physical Therapy

## 2018-09-27 ENCOUNTER — Other Ambulatory Visit: Payer: Self-pay

## 2018-09-27 DIAGNOSIS — M62838 Other muscle spasm: Secondary | ICD-10-CM | POA: Diagnosis not present

## 2018-09-27 DIAGNOSIS — M542 Cervicalgia: Secondary | ICD-10-CM

## 2018-09-27 DIAGNOSIS — M25512 Pain in left shoulder: Secondary | ICD-10-CM | POA: Diagnosis not present

## 2018-09-27 DIAGNOSIS — G8929 Other chronic pain: Secondary | ICD-10-CM | POA: Diagnosis not present

## 2018-09-27 NOTE — Therapy (Signed)
[place], [place], [address] Phone: [phone]   Fax:  [phone]  Physical Therapy Treatment  Patient Details  Name: Deanna Spencer MRN: [id number] Date of Birth: 30-Mar-1940 Referring Provider (PT): Dr Ruthann Cancer Chambliss/ Nuala Alpha DO    Encounter Date: 09/27/2018  PT End of Session - 09/27/18 1449    Visit Number  8    Number of Visits  12    Date for PT Re-Evaluation  10/11/18    Authorization Type  [employer]; progress note at 10 weeks    PT Start Time  1111   Patient was 11 minutes late   PT Stop Time  1200    PT Time Calculation (min)  49 min    Activity Tolerance  Patient tolerated treatment well       Past Medical History:  Diagnosis Date  . AAA (abdominal aortic aneurysm) ([place])    3.5cm by CT 11/29/16  . AAA (abdominal aortic aneurysm) without rupture ([place]) 12/21/2016   Stable 3cm 4/20, needs f/u imaging ~4/23  . Acute abdominal pain 05/05/2018  . Anemia   . Atrial fibrillation ([place]) 11/22/2016  . Bee sting 06/28/2017  . Cerumen impaction 06/28/2017  . Childhood asthma    connected to allergies  . Chronic lower back pain   . Chronic systolic heart failure ([place]) 12/14/2016  . Diplopia 09/27/2017  . Diverticulosis   . Duodenal ulcer   . Dyspnea   . Dysrhythmia    a fib  . GERD (gastroesophageal reflux disease)   . GI bleed    "have had both upper and lower GIB"  . GI bleed 05/07/2018  . Gout   . Heart attack ([place]) 2017  . Heart murmur   . History of anastomosis and bypass hemorrhage 05/06/2018  . History of blood transfusion 2017-2018   "related to bleeding ulcers; 47 units PRBC; 2U plasma" (11/23/2016)  . History of duodenal ulcer 11/18/2016  . History of heart attack 11/18/2016  . History of hiatal hernia   . Hx of blood clots    in heart  . IBS (irritable bowel syndrome)   . Liver lesion 08/07/2017  . Neuropathy   . Osteoarthritis   . Osteoarthritis of right knee  05/30/2017  . Osteoporosis   . Other chronic pain 11/18/2016  . Parasomnia 11/18/2016  . Perforated viscus 11/22/2016  . Peripheral neuropathy   . 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 Presence of Watchman left atrial appendage closure device   . Primary osteoarthritis of left hip 02/24/2017  . 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 Primary osteoarthritis of right knee 06/02/2017  . Raynaud disease   . Rheumatoid arteritis ([place])   . Rheumatoid arthritis involving both hands ([place]) 09/27/2017  . S/P exploratory laparotomy 11/22/2016  . Seasonal allergies 08/07/2017  . 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 SSS (sick sinus syndrome) ([place])    a. s/p Medtronic PPM placement in 2017  . Stomach ulcer   . Vision changes 08/07/2017    Past Surgical History:  Procedure Laterality Date  . ABDOMINAL HYSTERECTOMY    . APPENDECTOMY    . ARTHROPLASTY Right 06/02/2017   RIGHT PATELLA-FEMORAL ARTHROPLASTY, with placement of a cemented 29 mm Stryker asymmetric triathlon patellar button, revision of the tibial polyethylene bearing.  Marland Kitchen BIOPSY  05/06/2018   Procedure: BIOPSY;  Surgeon: Gatha Mayer, MD;  Location: [place];  Service: Endoscopy;;  . CARPAL TUNNEL  RELEASE Bilateral   . CATARACT EXTRACTION W/ INTRAOCULAR LENS  IMPLANT, BILATERAL Bilateral   . CHOLECYSTECTOMY OPEN    . DILATION AND CURETTAGE OF UTERUS    . ESOPHAGOGASTRODUODENOSCOPY (EGD) WITH PROPOFOL N/A 05/06/2018   Procedure: ESOPHAGOGASTRODUODENOSCOPY (EGD) WITH PROPOFOL;  Surgeon: Gatha Mayer, MD;  Location: [place];  Service: Endoscopy;  Laterality: N/A;  . FOOT FUSION Right   . INGUINAL HERNIA REPAIR Right X 2  . INSERT / REPLACE / REMOVE PACEMAKER  2017   medtronic  . JOINT REPLACEMENT    . LAPAROTOMY N/A 11/21/2016   Procedure: EXPLORATORY LAPAROTOMY, REVISION OF  GASTROJEJUNOSTOMY, ESOPHAGOGASTROSCOPY;  Surgeon: Clovis Riley, MD;  Location: [place];  Service:  General;  Laterality: N/A;  . LEFT ATRIAL APPENDAGE OCCLUSION  2018   placed  via groin  . LUMBAR FUSION  X 2  . PATELLA-FEMORAL ARTHROPLASTY Right 06/02/2017   Procedure: RIGHT PATELLA-FEMORAL ARTHROPLASTY;  Surgeon: Frederik Pear, MD;  Location: [place];  Service: Orthopedics;  Laterality: Right;  . REVERSE SHOULDER ARTHROPLASTY Left    reverse shoulder replacement  . ROUX-EN-Y GASTRIC BYPASS  2005  . ROUX-EN-Y PROCEDURE  20017 X 2   "revisions"  . TONSILLECTOMY AND ADENOIDECTOMY    . TOTAL HIP ARTHROPLASTY Right 02/24/2017   Procedure: TOTAL HIP ARTHROPLASTY;  Surgeon: Earlie Server, MD;  Location: [facility];  Service: Orthopedics;  Laterality: Right;  . TOTAL KNEE ARTHROPLASTY Bilateral   . TOTAL SHOULDER ARTHROPLASTY Bilateral   . TUBAL LIGATION      There were no vitals filed for this visit.  Subjective Assessment - 09/27/18 1446    Subjective  Patient reports no real change., She felt better after the last visit for about a day. She did a lot of activity bking last night and had increased pain.    Pertinent History  A-fib, stomach ulcers, frequent hospitilizations,    Diagnostic tests  nothing in the chart    Patient Stated Goals  to have less pain in her neck and back    Currently in Pain?  Yes    Pain Score  5     Pain Location  Neck    Pain Orientation  Left    Pain Descriptors / Indicators  Aching    Pain Type  Chronic pain    Pain Onset  More than a month ago    Pain Frequency  Constant    Aggravating Factors   turning the head    Pain Relieving Factors  rest    Effect of Pain on Daily Activities  difficulty turning her head                       [facility] Adult PT Treatment/Exercise - 09/27/18 0001      Neck Exercises: Standing   Other Standing Exercises  scap retraction 2x10 yellow      Shoulder Exercises: Supine   Flexion  AAROM;Left    Other Supine Exercises  bilateral ER to neutral x10 yellow       Shoulder Exercises: Seated   Other Seated  Exercises  scap retraction 2x10       Manual Therapy   Soft tissue mobilization  left upper trap; trigger point release to cervical spine; peri-scpaular area and posterior shoulder  using IASTYM  and using manual trigger point release    Passive ROM  Left GHJ into all planes as tolerated     Manual Traction  to cervical spine  PT Education - 09/27/18 1447    Education Details  reviewed proper posture    Person(s) Educated  Patient    Methods  Explanation;Demonstration;Tactile cues;Verbal cues    Comprehension  Verbalized understanding;Returned demonstration;Verbal cues required;Tactile cues required       PT Short Term Goals - 09/10/18 1235      PT SHORT TERM GOAL #1   Title  Patient will report 2/10 pain at worst with available movement    Time  3    Period  Weeks    Status  On-going      PT SHORT TERM GOAL #2   Title  Patient will increase bilateral cervical rotation by 10 degrees    Time  3    Period  Weeks    Status  New      PT SHORT TERM GOAL #3   Title  Patient will restate the importance of proper posture while using her phone.    Time  3    Period  Weeks    Status  New        PT Long Term Goals - 08/30/18 1531      PT LONG TERM GOAL #1   Title  Patient will report no pain with the limited function of her left shoulder    Time  6    Period  Weeks    Status  New    Target Date  10/11/18      PT LONG TERM GOAL #2   Title  Patient will increase bilateral cervical rotation to 86 degrees    Time  6    Period  Weeks    Status  New    Target Date  10/11/18      PT LONG TERM GOAL #3   Title  Patient will sleep thorugh the night without increased neck and shoulder pain    Time  6    Period  Weeks    Status  New    Target Date  10/11/18            Plan - 09/27/18 1450    Clinical Impression Statement  Patient feels better after her treatment but she continues to have pain within a few days of treatment. Patient advised to continue  to work on her HEP. We will hoepfully start to see some carryonver over the next few visits or we will likley have to D/C to HEP.    Comorbidities  multiple surgies on left shoulder; A-fib, RA, Reynauds, multi-joint OA, pacemaker    Examination-Activity Limitations  Locomotion Level;Reach Overhead;Carry;Lift    Examination-Participation Restrictions  Cleaning;Meal Prep;Community Activity    Stability/Clinical Decision Making  Evolving/Moderate complexity    Clinical Decision Making  Moderate    Rehab Potential  Good    PT Frequency  2x / week    PT Duration  6 weeks    PT Treatment/Interventions  ADLs/Self Care Home Management;Cryotherapy;Electrical Stimulation;Iontophoresis 4mg /ml Dexamethasone;Moist Heat;Ultrasound;DME Instruction;Gait training;Stair training;Functional mobility training;Therapeutic activities;Therapeutic exercise;Neuromuscular re-education;Patient/family education;Manual techniques;Passive range of motion;Taping    PT Next Visit Plan  isometrics for deltoid Limited modalities 2nd to reynauds and pacemaker, pustural correction; consider decompression position    PT Home Exercise Plan  cerivcal rotation; scpa retraction, left upper trap stretch, seated short arc flx & abd, supine chest press, row anchored, SL ER, biceps curls    Consulted and Agree with Plan of Care  Patient       Patient will benefit from skilled therapeutic intervention in  order to improve the following deficits and impairments:     Visit Diagnosis: Cervicalgia  Chronic left shoulder pain  Other muscle spasm     Problem List Patient Active Problem List   Diagnosis Date Noted  . Depression, major, single episode, mild ([place]) 09/20/2018  . Hearing loss 09/20/2018  . History of double vision 09/20/2018  . Fatigue 08/01/2018  . Left shoulder pain 06/30/2018  . Chronic right shoulder pain 06/14/2018  . Anemia due to chronic blood loss 05/07/2018  . Odynophagia 05/07/2018  . Protein-calorie  malnutrition ([place]) 05/07/2018  . Absolute anemia   . Cardiac pacemaker in situ 05/06/2018  . History of anastomosis and bypass hemorrhage 05/06/2018  . Atrial fibrillation with rapid ventricular response ([place])   . Dehydration   . Bilious vomiting with nausea   . Gastrojejunal ulcer   . Acute abdominal pain 05/05/2018  . Rheumatoid arthritis involving both hands ([place]) 09/27/2017  . Seasonal allergies 08/07/2017  . Primary osteoarthritis of right knee 06/02/2017  . Osteoarthritis of right knee 05/30/2017  . Primary osteoarthritis of left hip 02/24/2017  . AAA (abdominal aortic aneurysm) without rupture ([place]) 12/21/2016  . Chronic systolic heart failure ([place]) 12/14/2016  . Atrial fibrillation ([place]) 11/22/2016  . Peripheral neuropathy 11/22/2016  . GERD (gastroesophageal reflux disease) 11/22/2016  . Abdominal pain 11/18/2016  . 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 Parasomnia 11/18/2016    Carney Living PT DPT  09/27/2018, 2:56 PM  [facility] [address] [place], [place], [address] Phone: [phone]   Fax:  [phone]  Name: Deanna Spencer MRN: [id number] Date of Birth: [birth year]

## 2018-09-28 ENCOUNTER — Ambulatory Visit: Payer: Medicare Other | Admitting: Cardiology

## 2018-09-28 ENCOUNTER — Telehealth: Payer: Self-pay

## 2018-09-28 DIAGNOSIS — Z23 Encounter for immunization: Secondary | ICD-10-CM | POA: Diagnosis not present

## 2018-09-28 NOTE — Telephone Encounter (Signed)
Pt will need to r/s appointment also let shayna know after pt has been r/s'd

## 2018-10-02 ENCOUNTER — Ambulatory Visit: Payer: Medicare Other | Admitting: Physical Therapy

## 2018-10-02 ENCOUNTER — Other Ambulatory Visit: Payer: Self-pay

## 2018-10-02 ENCOUNTER — Encounter: Payer: Self-pay | Admitting: Physical Therapy

## 2018-10-02 DIAGNOSIS — M542 Cervicalgia: Secondary | ICD-10-CM | POA: Diagnosis not present

## 2018-10-02 DIAGNOSIS — M25512 Pain in left shoulder: Secondary | ICD-10-CM

## 2018-10-02 DIAGNOSIS — G8929 Other chronic pain: Secondary | ICD-10-CM | POA: Diagnosis not present

## 2018-10-02 DIAGNOSIS — M62838 Other muscle spasm: Secondary | ICD-10-CM | POA: Diagnosis not present

## 2018-10-02 NOTE — Therapy (Signed)
[address] [place], [place], [address] Phone: [phone]   Fax:  [phone]  Physical Therapy Treatment  Patient Details  Name: Deanna Spencer MRN: [id number] Date of Birth: 1940/10/08 Referring Provider (PT): Dr Ruthann Cancer Chambliss/ Nuala Alpha DO    Encounter Date: 10/02/2018  PT End of Session - 10/02/18 1539    Visit Number  9    Number of Visits  12    Date for PT Re-Evaluation  10/11/18    Authorization Type  [employer]; progress note at 10 weeks    PT Start Time  1116    PT Stop Time  1155    PT Time Calculation (min)  39 min    Activity Tolerance  Patient tolerated treatment well    Behavior During Therapy  [facility] for tasks assessed/performed       Past Medical History:  Diagnosis Date  . AAA (abdominal aortic aneurysm) ([place])    3.5cm by CT 11/29/16  . AAA (abdominal aortic aneurysm) without rupture ([place]) 12/21/2016   Stable 3cm 4/20, needs f/u imaging ~4/23  . Acute abdominal pain 05/05/2018  . Anemia   . Atrial fibrillation ([place]) 11/22/2016  . Bee sting 06/28/2017  . Cerumen impaction 06/28/2017  . Childhood asthma    connected to allergies  . Chronic lower back pain   . Chronic systolic heart failure ([place]) 12/14/2016  . Diplopia 09/27/2017  . Diverticulosis   . Duodenal ulcer   . Dyspnea   . Dysrhythmia    a fib  . GERD (gastroesophageal reflux disease)   . GI bleed    "have had both upper and lower GIB"  . GI bleed 05/07/2018  . Gout   . Heart attack ([place]) 2017  . Heart murmur   . History of anastomosis and bypass hemorrhage 05/06/2018  . History of blood transfusion 2017-2018   "related to bleeding ulcers; 47 units PRBC; 2U plasma" (11/23/2016)  . History of duodenal ulcer 11/18/2016  . History of heart attack 11/18/2016  . History of hiatal hernia   . Hx of blood clots    in heart  . IBS (irritable bowel syndrome)   . Liver lesion 08/07/2017  . Neuropathy   . Osteoarthritis   .  Osteoarthritis of right knee 05/30/2017  . Osteoporosis   . Other chronic pain 11/18/2016  . Parasomnia 11/18/2016  . Perforated viscus 11/22/2016  . Peripheral neuropathy   . Presence of permanent cardiac pacemaker 10/2015   medtronic; Serial # [id number]  . Presence of Watchman left atrial appendage closure device   . Primary osteoarthritis of left hip 02/24/2017  . 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 Primary osteoarthritis of right knee 06/02/2017  . Raynaud disease   . Rheumatoid arteritis ([place])   . Rheumatoid arthritis involving both hands ([place]) 09/27/2017  . S/P exploratory laparotomy 11/22/2016  . Seasonal allergies 08/07/2017  . 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 SSS (sick sinus syndrome) ([place])    a. s/p Medtronic PPM placement in 2017  . Stomach ulcer   . Vision changes 08/07/2017    Past Surgical History:  Procedure Laterality Date  . ABDOMINAL HYSTERECTOMY    . APPENDECTOMY    . ARTHROPLASTY Right 06/02/2017   RIGHT PATELLA-FEMORAL ARTHROPLASTY, with placement of a cemented 29 mm Stryker asymmetric triathlon patellar button, revision of the tibial polyethylene bearing.  Marland Kitchen BIOPSY  05/06/2018   Procedure: BIOPSY;  Surgeon: Gatha Mayer, MD;  Location: [facility];  Service:  Endoscopy;;  . CARPAL TUNNEL RELEASE Bilateral   . CATARACT EXTRACTION W/ INTRAOCULAR LENS  IMPLANT, BILATERAL Bilateral   . CHOLECYSTECTOMY OPEN    . DILATION AND CURETTAGE OF UTERUS    . ESOPHAGOGASTRODUODENOSCOPY (EGD) WITH PROPOFOL N/A 05/06/2018   Procedure: ESOPHAGOGASTRODUODENOSCOPY (EGD) WITH PROPOFOL;  Surgeon: Gatha Mayer, MD;  Location: [facility] [place];  Service: Endoscopy;  Laterality: N/A;  . FOOT FUSION Right   . INGUINAL HERNIA REPAIR Right X 2  . INSERT / REPLACE / REMOVE PACEMAKER  2017   medtronic  . JOINT REPLACEMENT    . LAPAROTOMY N/A 11/21/2016   Procedure: EXPLORATORY LAPAROTOMY, REVISION OF  GASTROJEJUNOSTOMY, ESOPHAGOGASTROSCOPY;  Surgeon: Clovis Riley, MD;   Location: [facility];  Service: General;  Laterality: N/A;  . LEFT ATRIAL APPENDAGE OCCLUSION  2018   placed  via groin  . LUMBAR FUSION  X 2  . PATELLA-FEMORAL ARTHROPLASTY Right 06/02/2017   Procedure: RIGHT PATELLA-FEMORAL ARTHROPLASTY;  Surgeon: Frederik Pear, MD;  Location: [facility];  Service: Orthopedics;  Laterality: Right;  . REVERSE SHOULDER ARTHROPLASTY Left    reverse shoulder replacement  . ROUX-EN-Y GASTRIC BYPASS  2005  . ROUX-EN-Y PROCEDURE  20017 X 2   "revisions"  . TONSILLECTOMY AND ADENOIDECTOMY    . TOTAL HIP ARTHROPLASTY Right 02/24/2017   Procedure: TOTAL HIP ARTHROPLASTY;  Surgeon: Earlie Server, MD;  Location: [facility];  Service: Orthopedics;  Laterality: Right;  . TOTAL KNEE ARTHROPLASTY Bilateral   . TOTAL SHOULDER ARTHROPLASTY Bilateral   . TUBAL LIGATION      There were no vitals filed for this visit.  Subjective Assessment - 10/02/18 1120    Subjective  Patient fels like his shoulder has been a bit better. She feels like she dosent have to hold it as close. She has been cooking. She has been working on her exercises. She was 15 minutes late.    Pertinent History  A-fib, stomach ulcers, frequent hospitilizations,    Diagnostic tests  nothing in the chart    Currently in Pain?  Yes    Pain Score  3     Pain Location  Neck    Pain Orientation  Right    Pain Descriptors / Indicators  Aching    Pain Type  Chronic pain    Pain Onset  More than a month ago    Pain Frequency  Constant    Aggravating Factors   turning her head    Pain Onset  More than a month ago                       [facility] Adult PT Treatment/Exercise - 10/02/18 0001      Neck Exercises: Standing   Other Standing Exercises  scap retraction 2x10 yellow    Other Standing Exercises  shoulder extension x15 yellow       Shoulder Exercises: Supine   Other Supine Exercises  bilateral ER to neutral x10 yellow       Shoulder Exercises: Pulleys   Flexion Limitations  2 min       Manual  Therapy   Soft tissue mobilization  left upper trap; trigger point release to cervical spine; peri-scpaular area and posterior shoulder  using IASTYM  and using manual trigger point release    Passive ROM  Left GHJ into all planes as tolerated     Manual Traction  to cervical spine  PT Education - 10/02/18 1539    Education Details  reviewed posture and exercises    Person(s) Educated  Patient    Methods  Explanation;Demonstration;Tactile cues;Verbal cues    Comprehension  Returned demonstration;Verbal cues required;Tactile cues required;Verbalized understanding       PT Short Term Goals - 10/02/18 1542      PT SHORT TERM GOAL #1   Title  Patient will report 2/10 pain at worst with available movement    Time  3    Period  Weeks    Status  On-going    Target Date  09/20/18      PT SHORT TERM GOAL #2   Title  Patient will increase bilateral cervical rotation by 10 degrees    Time  3    Period  Weeks    Status  On-going    Target Date  09/20/18      PT SHORT TERM GOAL #3   Title  Patient will restate the importance of proper posture while using her phone.    Time  3    Period  Weeks    Status  On-going    Target Date  09/20/18        PT Long Term Goals - 08/30/18 1531      PT LONG TERM GOAL #1   Title  Patient will report no pain with the limited function of her left shoulder    Time  6    Period  Weeks    Status  New    Target Date  10/11/18      PT LONG TERM GOAL #2   Title  Patient will increase bilateral cervical rotation to 86 degrees    Time  6    Period  Weeks    Status  New    Target Date  10/11/18      PT LONG TERM GOAL #3   Title  Patient will sleep thorugh the night without increased neck and shoulder pain    Time  6    Period  Weeks    Status  New    Target Date  10/11/18            Plan - 10/02/18 1540    Clinical Impression Statement  Patient was limited today by being late. Therapy continues to advance exercises as  tolerated. She has spasming more on the right side today. She reports improving pain.    Personal Factors and Comorbidities  Comorbidity 1    Comorbidities  multiple surgies on left shoulder; A-fib, RA, Reynauds, multi-joint OA, pacemaker    Examination-Activity Limitations  Locomotion Level;Reach Overhead;Carry;Lift    Examination-Participation Restrictions  Cleaning;Meal Prep;Community Activity    Stability/Clinical Decision Making  Evolving/Moderate complexity    Clinical Decision Making  Moderate    Rehab Potential  Good    PT Frequency  2x / week    PT Duration  6 weeks    PT Treatment/Interventions  ADLs/Self Care Home Management;Cryotherapy;Electrical Stimulation;Iontophoresis 4mg /ml Dexamethasone;Moist Heat;Ultrasound;DME Instruction;Gait training;Stair training;Functional mobility training;Therapeutic activities;Therapeutic exercise;Neuromuscular re-education;Patient/family education;Manual techniques;Passive range of motion;Taping    PT Next Visit Plan  isometrics for deltoid Limited modalities 2nd to reynauds and pacemaker, pustural correction; consider decompression position    PT Home Exercise Plan  cerivcal rotation; scpa retraction, left upper trap stretch, seated short arc flx & abd, supine chest press, row anchored, SL ER, biceps curls    Consulted and Agree with Plan of Care  Patient  Patient will benefit from skilled therapeutic intervention in order to improve the following deficits and impairments:  Pain, Decreased mobility, Decreased strength, Decreased activity tolerance, Decreased endurance, Impaired UE functional use, Increased fascial restricitons, Impaired tone  Visit Diagnosis: Cervicalgia  Chronic left shoulder pain  Other muscle spasm     Problem List Patient Active Problem List   Diagnosis Date Noted  . Depression, major, single episode, mild ([place]) 09/20/2018  . Hearing loss 09/20/2018  . History of double vision 09/20/2018  . Fatigue 08/01/2018   . Left shoulder pain 06/30/2018  . Chronic right shoulder pain 06/14/2018  . Anemia due to chronic blood loss 05/07/2018  . Odynophagia 05/07/2018  . Protein-calorie malnutrition ([place]) 05/07/2018  . Absolute anemia   . Cardiac pacemaker in situ 05/06/2018  . History of anastomosis and bypass hemorrhage 05/06/2018  . Atrial fibrillation with rapid ventricular response ([place])   . Dehydration   . Bilious vomiting with nausea   . Gastrojejunal ulcer   . Acute abdominal pain 05/05/2018  . Rheumatoid arthritis involving both hands ([place]) 09/27/2017  . Seasonal allergies 08/07/2017  . Primary osteoarthritis of right knee 06/02/2017  . Osteoarthritis of right knee 05/30/2017  . Primary osteoarthritis of left hip 02/24/2017  . AAA (abdominal aortic aneurysm) without rupture ([place]) 12/21/2016  . Chronic systolic heart failure ([place]) 12/14/2016  . Atrial fibrillation ([place]) 11/22/2016  . Peripheral neuropathy 11/22/2016  . GERD (gastroesophageal reflux disease) 11/22/2016  . Abdominal pain 11/18/2016  . 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 Parasomnia 11/18/2016    Carney Living PT DPT  10/02/2018, 3:43 PM  [facility] [address] [place], [place], [address] Phone: [phone]   Fax:  [phone]  Name: JAZZABELLA CHABOLLA MRN: [id number] Date of Birth: May 18, 1940

## 2018-10-03 ENCOUNTER — Other Ambulatory Visit: Payer: Self-pay | Admitting: Pharmacy Technician

## 2018-10-03 NOTE — Patient Outreach (Signed)
[facility]) Care Management  10/03/2018  Deanna Spencer [birth year] [id number]   ADDENDUM  Incoming call received from patient in regards to [place] application for [place].  Spoke to patient, HIPAA identifiers verified.  Patient informed she has not received the 4th application that was mailed to her on 09/26/2018. She informed she does check her mail daily. Informed patient I would followup with her next week to see if it has arrived and to discuss next steps if it has not.  Patient inquired who she needed to contact as she has been receiving pill paks from [facility] and they are all "messed up". Informed patient she would need to contact [facility] and specifically tell them what is wrong and why it is wrong. She will also specifically have to tell them what meds she does not want in the paks for whatever reason, affordability, discontinued, changed, etc. Patient inquired if I would have our pharmacist outreach her with some questions she had about the paks. Informed patient I would send a note to the pharmacist but I was unsure of their call/care load and that it may be a few days before they call her. Patient verbalized understanding of the above information.  Will route note to [place] as requested by patient. Will followup with patient in 3-5 days to inquire about the application.  [name] P. Theodoros Stjames, [place] Management [phone]

## 2018-10-03 NOTE — Patient Outreach (Signed)
[facility]) Care Management  10/03/2018  Deanna Spencer 08-20-1940 [id number]   Unsuccessful outreach call placed to patient in regards to [employer] application for [place].   Unfortunately patient did not answer the phone, HPAA compliant voicemail left.  Was calling patient (9th overall call) to inquire if she had received the 4th mailing of the [place] application. This was my 1st followup after mailing her the 4th application.  Will followup with patient in 3-7 business days to inquire if she has received and mailed back the application.  Remer Couse P. Peterson Mathey, [place] Management [phone]

## 2018-10-04 ENCOUNTER — Other Ambulatory Visit: Payer: Self-pay | Admitting: Pharmacy Technician

## 2018-10-04 ENCOUNTER — Encounter: Payer: Self-pay | Admitting: Physical Therapy

## 2018-10-04 ENCOUNTER — Ambulatory Visit: Payer: Medicare Other | Admitting: Physical Therapy

## 2018-10-04 ENCOUNTER — Other Ambulatory Visit: Payer: Self-pay

## 2018-10-04 ENCOUNTER — Telehealth: Payer: Self-pay | Admitting: *Deleted

## 2018-10-04 DIAGNOSIS — G8929 Other chronic pain: Secondary | ICD-10-CM | POA: Diagnosis not present

## 2018-10-04 DIAGNOSIS — M62838 Other muscle spasm: Secondary | ICD-10-CM | POA: Diagnosis not present

## 2018-10-04 DIAGNOSIS — M542 Cervicalgia: Secondary | ICD-10-CM | POA: Diagnosis not present

## 2018-10-04 DIAGNOSIS — M25512 Pain in left shoulder: Secondary | ICD-10-CM

## 2018-10-04 NOTE — Patient Outreach (Signed)
[facility]) Care Management  10/04/2018  Deanna Spencer 13-Jun-1940 [id number]    ADDENDUM  Patient left me a voicemail message at (9:30pm on 10/03/2018 which I received today.  Patient's voicemail message stated she received the [place] application for [name] in the mail and that she was going to fill it out tonight and place in the mail tomorrow morning (meaning 10/04/2018).  Will followup with patient in 10-15 business days if application has not been received.  [name] P. [name], [name] Management [phone]

## 2018-10-04 NOTE — Telephone Encounter (Signed)
Received VM from [place] with [facility].    Detrol [place] is still just as expensive as the [place] and does not work as well.  [facility] is attempting to set pt up with pt assistance for [place].  Forms have been faxed to Dr. Garlan Fillers.  Christen Bame, CMA

## 2018-10-04 NOTE — Patient Outreach (Signed)
[facility]) Care Management  10/04/2018  Jolane A Kaine 07-19-1940 [id number]   Care coordination call placed to Dr. Arlana Pouch office in regards to [employer] application for [employer]  Left a voicemail message on the pharmacist line informing I was faxing over the provider portion of the patient assistance application.  Will await a call or fax back from provider.  Keanu Lesniak P. Roselynne Lortz, [place] Management [phone]

## 2018-10-04 NOTE — Therapy (Addendum)
[place], [place], [address] Phone: [phone]   Fax:  [phone]  Physical Therapy Treatment Progress Note Reporting Period 08/30/2018 to 10/04/2018  See note below for Objective Data and Assessment of Progress/Goals.       Patient Details  Name: Deanna Spencer MRN: [id number] Date of Birth: 1940/02/04 Referring Provider (PT): Dr Ruthann Cancer Chambliss/ Nuala Alpha DO    Encounter Date: 10/04/2018  PT End of Session - 10/04/18 1116    Visit Number  10    Number of Visits  12    Date for PT Re-Evaluation  10/11/18    Authorization Type  [employer]; progress note at 10 weeks    PT Start Time  1116   pt arrived late   PT Stop Time  1144    PT Time Calculation (min)  28 min    Activity Tolerance  Patient tolerated treatment well    Behavior During Therapy  [facility] for tasks assessed/performed       Past Medical History:  Diagnosis Date  . AAA (abdominal aortic aneurysm) ([place])    3.5cm by CT 11/29/16  . AAA (abdominal aortic aneurysm) without rupture ([place]) 12/21/2016   Stable 3cm 4/20, needs f/u imaging ~4/23  . Acute abdominal pain 05/05/2018  . Anemia   . Atrial fibrillation ([place]) 11/22/2016  . Bee sting 06/28/2017  . Cerumen impaction 06/28/2017  . Childhood asthma    connected to allergies  . Chronic lower back pain   . Chronic systolic heart failure ([place]) 12/14/2016  . Diplopia 09/27/2017  . Diverticulosis   . Duodenal ulcer   . Dyspnea   . Dysrhythmia    a fib  . GERD (gastroesophageal reflux disease)   . GI bleed    "have had both upper and lower GIB"  . GI bleed 05/07/2018  . Gout   . Heart attack ([place]) 2017  . Heart murmur   . History of anastomosis and bypass hemorrhage 05/06/2018  . History of blood transfusion 2017-2018   "related to bleeding ulcers; 47 units PRBC; 2U plasma" (11/23/2016)  . History of duodenal ulcer 11/18/2016  . History of heart attack 11/18/2016  . History of  hiatal hernia   . Hx of blood clots    in heart  . IBS (irritable bowel syndrome)   . Liver lesion 08/07/2017  . Neuropathy   . Osteoarthritis   . Osteoarthritis of right knee 05/30/2017  . Osteoporosis   . Other chronic pain 11/18/2016  . Parasomnia 11/18/2016  . Perforated viscus 11/22/2016  . Peripheral neuropathy   . 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 Presence of Watchman left atrial appendage closure device   . Primary osteoarthritis of left hip 02/24/2017  . 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 Primary osteoarthritis of right knee 06/02/2017  . Raynaud disease   . Rheumatoid arteritis ([place])   . Rheumatoid arthritis involving both hands ([place]) 09/27/2017  . S/P exploratory laparotomy 11/22/2016  . Seasonal allergies 08/07/2017  . 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 SSS (sick sinus syndrome) ([place])    a. s/p Medtronic PPM placement in 2017  . Stomach ulcer   . Vision changes 08/07/2017    Past Surgical History:  Procedure Laterality Date  . ABDOMINAL HYSTERECTOMY    . APPENDECTOMY    . ARTHROPLASTY Right 06/02/2017   RIGHT PATELLA-FEMORAL ARTHROPLASTY, with placement of a cemented 29 mm Stryker asymmetric triathlon patellar button,  revision of the tibial polyethylene bearing.  Marland Kitchen BIOPSY  05/06/2018   Procedure: BIOPSY;  Surgeon: Gatha Mayer, MD;  Location: [place];  Service: Endoscopy;;  . CARPAL TUNNEL RELEASE Bilateral   . CATARACT EXTRACTION W/ INTRAOCULAR LENS  IMPLANT, BILATERAL Bilateral   . CHOLECYSTECTOMY OPEN    . DILATION AND CURETTAGE OF UTERUS    . ESOPHAGOGASTRODUODENOSCOPY (EGD) WITH PROPOFOL N/A 05/06/2018   Procedure: ESOPHAGOGASTRODUODENOSCOPY (EGD) WITH PROPOFOL;  Surgeon: Gatha Mayer, MD;  Location: [place];  Service: Endoscopy;  Laterality: N/A;  . FOOT FUSION Right   . INGUINAL HERNIA REPAIR Right X 2  . INSERT / REPLACE / REMOVE PACEMAKER  2017   medtronic  . JOINT REPLACEMENT    .  LAPAROTOMY N/A 11/21/2016   Procedure: EXPLORATORY LAPAROTOMY, REVISION OF  GASTROJEJUNOSTOMY, ESOPHAGOGASTROSCOPY;  Surgeon: Clovis Riley, MD;  Location: [place];  Service: General;  Laterality: N/A;  . LEFT ATRIAL APPENDAGE OCCLUSION  2018   placed  via groin  . LUMBAR FUSION  X 2  . PATELLA-FEMORAL ARTHROPLASTY Right 06/02/2017   Procedure: RIGHT PATELLA-FEMORAL ARTHROPLASTY;  Surgeon: Frederik Pear, MD;  Location: [place];  Service: Orthopedics;  Laterality: Right;  . REVERSE SHOULDER ARTHROPLASTY Left    reverse shoulder replacement  . ROUX-EN-Y GASTRIC BYPASS  2005  . ROUX-EN-Y PROCEDURE  20017 X 2   "revisions"  . TONSILLECTOMY AND ADENOIDECTOMY    . TOTAL HIP ARTHROPLASTY Right 02/24/2017   Procedure: TOTAL HIP ARTHROPLASTY;  Surgeon: Earlie Server, MD;  Location: [place];  Service: Orthopedics;  Laterality: Right;  . TOTAL KNEE ARTHROPLASTY Bilateral   . TOTAL SHOULDER ARTHROPLASTY Bilateral   . TUBAL LIGATION      There were no vitals filed for this visit.  Subjective Assessment - 10/04/18 1118    Subjective  the freezer guy was at my house so I ran late. woke up feeling tense. I slept on my Rt because my Lt was hurting. Points along Rt upper trap when describing pain.    Patient Stated Goals  to have less pain in her neck and back    Currently in Pain?  Yes    Pain Score  8     Pain Location  Neck    Pain Orientation  Right    Pain Descriptors / Indicators  Shooting;Tightness                       [facility] Adult PT Treatment/Exercise - 10/04/18 0001      Shoulder Exercises: Supine   Flexion Limitations  supine AAROM    Other Supine Exercises  biceps curls with post pressure on GHJ    Other Supine Exercises  isometric ER/IR holds-resisted by PT      Manual Therapy   Soft tissue mobilization  Lt upper trap    Passive ROM  Lt GHJ               PT Short Term Goals - 10/02/18 1542      PT SHORT TERM GOAL #1   Title  Patient will report 2/10  pain at worst with available movement    Time  3    Period  Weeks    Status  On-going    Target Date  09/20/18      PT SHORT TERM GOAL #2   Title  Patient will increase bilateral cervical rotation by 10 degrees    Time  3    Period  Weeks    Status  On-going    Target Date  09/20/18      PT SHORT TERM GOAL #3   Title  Patient will restate the importance of proper posture while using her phone.    Time  3    Period  Weeks    Status  On-going    Target Date  09/20/18        PT Long Term Goals - 08/30/18 1531      PT LONG TERM GOAL #1   Title  Patient will report no pain with the limited function of her left shoulder    Time  6    Period  Weeks    Status  New    Target Date  10/11/18      PT LONG TERM GOAL #2   Title  Patient will increase bilateral cervical rotation to 86 degrees    Time  6    Period  Weeks    Status  New    Target Date  10/11/18      PT LONG TERM GOAL #3   Title  Patient will sleep thorugh the night without increased neck and shoulder pain    Time  6    Period  Weeks    Status  New    Target Date  10/11/18            Plan - 10/04/18 1146    Clinical Impression Statement  Pt arrived late today and with very high pain levels after waking feeling very tight. concordant pain in trigger points reduced with manual therapy and pt reported feeling only a slight twinge at the end of treatment.    PT Treatment/Interventions  ADLs/Self Care Home Management;Cryotherapy;Electrical Stimulation;Iontophoresis 4mg /ml Dexamethasone;Moist Heat;Ultrasound;DME Instruction;Gait training;Stair training;Functional mobility training;Therapeutic activities;Therapeutic exercise;Neuromuscular re-education;Patient/family education;Manual techniques;Passive range of motion;Taping    PT Next Visit Plan  isometrics for deltoid Limited modalities 2nd to reynauds and pacemaker, pustural correction; lower trap activation    PT Home Exercise Plan  cerivcal rotation; scpa  retraction, left upper trap stretch, seated short arc flx & abd, supine chest press, row anchored, SL ER, biceps curls    Consulted and Agree with Plan of Care  Patient       Patient will benefit from skilled therapeutic intervention in order to improve the following deficits and impairments:  Pain, Decreased mobility, Decreased strength, Decreased activity tolerance, Decreased endurance, Impaired UE functional use, Increased fascial restricitons, Impaired tone  Visit Diagnosis: Cervicalgia  Chronic left shoulder pain  Other muscle spasm     Problem List Patient Active Problem List   Diagnosis Date Noted  . Depression, major, single episode, mild ([place]) 09/20/2018  . Hearing loss 09/20/2018  . History of double vision 09/20/2018  . Fatigue 08/01/2018  . Left shoulder pain 06/30/2018  . Chronic right shoulder pain 06/14/2018  . Anemia due to chronic blood loss 05/07/2018  . Odynophagia 05/07/2018  . Protein-calorie malnutrition ([place]) 05/07/2018  . Absolute anemia   . Cardiac pacemaker in situ 05/06/2018  . History of anastomosis and bypass hemorrhage 05/06/2018  . Atrial fibrillation with rapid ventricular response ([employer])   . Dehydration   . Bilious vomiting with nausea   . Gastrojejunal ulcer   . Acute abdominal pain 05/05/2018  . Rheumatoid arthritis involving both hands ([place]) 09/27/2017  . Seasonal allergies 08/07/2017  . Primary osteoarthritis of right knee 06/02/2017  . Osteoarthritis of right knee 05/30/2017  . Primary osteoarthritis of left hip 02/24/2017  .  AAA (abdominal aortic aneurysm) without rupture ([place]) 12/21/2016  . Chronic systolic heart failure ([place]) 12/14/2016  . Atrial fibrillation ([place]) 11/22/2016  . Peripheral neuropathy 11/22/2016  . GERD (gastroesophageal reflux disease) 11/22/2016  . Abdominal pain 11/18/2016  . 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 Parasomnia 11/18/2016    Cayce Paschal C. Anaisha Mago PT,  DPT 10/04/18 11:49 AM   [facility] [address] [place], [place], [address] Phone: [phone]   Fax:  [phone]  Name: GLENDOLA RENDEROS MRN: [id number] Date of Birth: August 23, 1940

## 2018-10-09 ENCOUNTER — Other Ambulatory Visit: Payer: Self-pay

## 2018-10-09 ENCOUNTER — Ambulatory Visit: Payer: Medicare Other | Admitting: Physical Therapy

## 2018-10-09 DIAGNOSIS — G8929 Other chronic pain: Secondary | ICD-10-CM

## 2018-10-09 DIAGNOSIS — M62838 Other muscle spasm: Secondary | ICD-10-CM

## 2018-10-09 DIAGNOSIS — M542 Cervicalgia: Secondary | ICD-10-CM

## 2018-10-09 DIAGNOSIS — M25512 Pain in left shoulder: Secondary | ICD-10-CM | POA: Diagnosis not present

## 2018-10-09 NOTE — Therapy (Signed)
[place], [place], [address] Phone: [phone]   Fax:  [phone]  Physical Therapy Treatment/Progress  Patient Details  Name: Deanna Spencer MRN: [id number] Date of Birth: 07/21/1940 Referring Provider (PT): Dr Ruthann Cancer Chambliss/ Nuala Alpha DO   Progress Note Reporting Period 08/30/2018 to 10/09/2018  See note below for Objective Data and Assessment of Progress/Goals.       Encounter Date: 10/09/2018  PT End of Session - 10/09/18 1338    Visit Number  10    Number of Visits  18    Date for PT Re-Evaluation  11/06/18    Authorization Type  [employer]; progress note at 10 visits    PT Start Time  1104    PT Stop Time  1156    PT Time Calculation (min)  52 min    Activity Tolerance  Patient tolerated treatment well    Behavior During Therapy  [facility] for tasks assessed/performed       Past Medical History:  Diagnosis Date  . AAA (abdominal aortic aneurysm) ([place])    3.5cm by CT 11/29/16  . AAA (abdominal aortic aneurysm) without rupture ([place]) 12/21/2016   Stable 3cm 4/20, needs f/u imaging ~4/23  . Acute abdominal pain 05/05/2018  . Anemia   . Atrial fibrillation ([place]) 11/22/2016  . Bee sting 06/28/2017  . Cerumen impaction 06/28/2017  . Childhood asthma    connected to allergies  . Chronic lower back pain   . Chronic systolic heart failure ([place]) 12/14/2016  . Diplopia 09/27/2017  . Diverticulosis   . Duodenal ulcer   . Dyspnea   . Dysrhythmia    a fib  . GERD (gastroesophageal reflux disease)   . GI bleed    "have had both upper and lower GIB"  . GI bleed 05/07/2018  . Gout   . Heart attack ([place]) 2017  . Heart murmur   . History of anastomosis and bypass hemorrhage 05/06/2018  . History of blood transfusion 2017-2018   "related to bleeding ulcers; 47 units PRBC; 2U plasma" (11/23/2016)  . History of duodenal ulcer 11/18/2016  . History of heart attack 11/18/2016  . History of hiatal  hernia   . Hx of blood clots    in heart  . IBS (irritable bowel syndrome)   . Liver lesion 08/07/2017  . Neuropathy   . Osteoarthritis   . Osteoarthritis of right knee 05/30/2017  . Osteoporosis   . Other chronic pain 11/18/2016  . Parasomnia 11/18/2016  . Perforated viscus 11/22/2016  . Peripheral neuropathy   . Presence of permanent cardiac pacemaker 10/2015   medtronic; Serial # [id number]  . Presence of Watchman left atrial appendage closure device   . Primary osteoarthritis of left hip 02/24/2017  . 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 Primary osteoarthritis of right knee 06/02/2017  . Raynaud disease   . Rheumatoid arteritis ([place])   . Rheumatoid arthritis involving both hands ([place]) 09/27/2017  . S/P exploratory laparotomy 11/22/2016  . Seasonal allergies 08/07/2017  . 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 SSS (sick sinus syndrome) ([place])    a. s/p Medtronic PPM placement in 2017  . Stomach ulcer   . Vision changes 08/07/2017    Past Surgical History:  Procedure Laterality Date  . ABDOMINAL HYSTERECTOMY    . APPENDECTOMY    . ARTHROPLASTY Right 06/02/2017   RIGHT PATELLA-FEMORAL ARTHROPLASTY, with placement of a cemented 29 mm Stryker asymmetric triathlon patellar button, revision of the tibial  polyethylene bearing.  Marland Kitchen BIOPSY  05/06/2018   Procedure: BIOPSY;  Surgeon: Gatha Mayer, MD;  Location: [place];  Service: Endoscopy;;  . CARPAL TUNNEL RELEASE Bilateral   . CATARACT EXTRACTION W/ INTRAOCULAR LENS  IMPLANT, BILATERAL Bilateral   . CHOLECYSTECTOMY OPEN    . DILATION AND CURETTAGE OF UTERUS    . ESOPHAGOGASTRODUODENOSCOPY (EGD) WITH PROPOFOL N/A 05/06/2018   Procedure: ESOPHAGOGASTRODUODENOSCOPY (EGD) WITH PROPOFOL;  Surgeon: Gatha Mayer, MD;  Location: [place];  Service: Endoscopy;  Laterality: N/A;  . FOOT FUSION Right   . INGUINAL HERNIA REPAIR Right X 2  . INSERT / REPLACE / REMOVE PACEMAKER  2017   medtronic  . JOINT REPLACEMENT    .  LAPAROTOMY N/A 11/21/2016   Procedure: EXPLORATORY LAPAROTOMY, REVISION OF  GASTROJEJUNOSTOMY, ESOPHAGOGASTROSCOPY;  Surgeon: Clovis Riley, MD;  Location: [place];  Service: General;  Laterality: N/A;  . LEFT ATRIAL APPENDAGE OCCLUSION  2018   placed  via groin  . LUMBAR FUSION  X 2  . PATELLA-FEMORAL ARTHROPLASTY Right 06/02/2017   Procedure: RIGHT PATELLA-FEMORAL ARTHROPLASTY;  Surgeon: Frederik Pear, MD;  Location: [place];  Service: Orthopedics;  Laterality: Right;  . REVERSE SHOULDER ARTHROPLASTY Left    reverse shoulder replacement  . ROUX-EN-Y GASTRIC BYPASS  2005  . ROUX-EN-Y PROCEDURE  20017 X 2   "revisions"  . TONSILLECTOMY AND ADENOIDECTOMY    . TOTAL HIP ARTHROPLASTY Right 02/24/2017   Procedure: TOTAL HIP ARTHROPLASTY;  Surgeon: Earlie Server, MD;  Location: [place];  Service: Orthopedics;  Laterality: Right;  . TOTAL KNEE ARTHROPLASTY Bilateral   . TOTAL SHOULDER ARTHROPLASTY Bilateral   . TUBAL LIGATION      There were no vitals filed for this visit.  Subjective Assessment - 10/09/18 1110    Subjective  Patient reports that her shoulder has been very sore.    Pertinent History  A-fib, stomach ulcers, frequent hospitilizations,         [facility] PT Assessment - 10/09/18 0001      AROM   Cervical Flexion  28    Cervical Extension  30    Cervical - Right Rotation  53    Cervical - Left Rotation  44      Strength   Overall Strength Comments  continues to have no active movement of the shoulder       Palpation   Palpation comment  Improved spasming but still has spasming                    [facility] Adult PT Treatment/Exercise - 10/09/18 0001      Neck Exercises: Standing   Other Standing Exercises  scap retraction 2x10 yellow    Other Standing Exercises  shoulder extension x15 yellow       Shoulder Exercises: Supine   Flexion Limitations  supine AAROM    Other Supine Exercises  biceps curls with post pressure on GHJ    Other Supine Exercises   isometric ER/IR holds-resisted by PT      Shoulder Exercises: Pulleys   Flexion Limitations  2 min       Manual Therapy   Soft tissue mobilization  Lt upper trap    Passive ROM  Lt GHJ    Manual Traction  to cervical spine                PT Short Term Goals - 10/09/18 1407      PT SHORT TERM GOAL #1  Title  Patient will report 2/10 pain at worst with available movement    Baseline  can reach a 9/10 but overall consistently less pain    Time  3    Period  Weeks    Status  On-going    Target Date  11/06/18      PT SHORT TERM GOAL #2   Title  Patient will increase bilateral cervical rotation by 10 degrees    Baseline  progressing towards goal    Time  3    Period  Weeks    Status  On-going    Target Date  11/06/18      PT SHORT TERM GOAL #3   Title  Patient will restate the importance of proper posture while using her phone.    Baseline  More aware of posture    Period  Weeks    Status  On-going    Target Date  11/06/18        PT Long Term Goals - 08/30/18 1531      PT LONG TERM GOAL #1   Title  Patient will report no pain with the limited function of her left shoulder    Time  6    Period  Weeks    Status  New    Target Date  10/11/18      PT LONG TERM GOAL #2   Title  Patient will increase bilateral cervical rotation to 86 degrees    Time  6    Period  Weeks    Status  New    Target Date  10/11/18      PT LONG TERM GOAL #3   Title  Patient will sleep thorugh the night without increased neck and shoulder pain    Time  6    Period  Weeks    Status  New    Target Date  10/11/18            Plan - 10/09/18 1359    Clinical Impression Statement  Patient cam ein today with high pain levels that started over the past few days. She feels like overall she is making progress but today is a bad day. She continues to have trigger points in her upper trap and peri-scapular are.She has been able to perfrom some light strengthening. She has had no  functional improvement with her left arm but it is questioable if she will 2nd to long term disability. Her pain levesl as a whole have improved. Therapy will continue with the patient 2W4 to decrease pain and continue to develop a postrual correction and Left UE program.    Examination-Participation Restrictions  Cleaning;Meal Prep;Community Activity    Stability/Clinical Decision Making  Evolving/Moderate complexity    Clinical Decision Making  Moderate    Rehab Potential  Good    PT Frequency  2x / week    PT Duration  4 weeks    PT Treatment/Interventions  ADLs/Self Care Home Management;Cryotherapy;Electrical Stimulation;Iontophoresis 4mg /ml Dexamethasone;Moist Heat;Ultrasound;DME Instruction;Gait training;Stair training;Functional mobility training;Therapeutic activities;Therapeutic exercise;Neuromuscular re-education;Patient/family education;Manual techniques;Passive range of motion;Taping    PT Next Visit Plan  isometrics for deltoid Limited modalities 2nd to reynauds and pacemaker, pustural correction; lower trap activation    PT Home Exercise Plan  cerivcal rotation; scpa retraction, left upper trap stretch, seated short arc flx & abd, supine chest press, row anchored, SL ER, biceps curls    Consulted and Agree with Plan of Care  Patient       Patient  will benefit from skilled therapeutic intervention in order to improve the following deficits and impairments:  Pain, Decreased mobility, Decreased strength, Decreased activity tolerance, Decreased endurance, Impaired UE functional use, Increased fascial restricitons, Impaired tone  Visit Diagnosis: Cervicalgia  Chronic left shoulder pain  Other muscle spasm     Problem List Patient Active Problem List   Diagnosis Date Noted  . Depression, major, single episode, mild ([place]) 09/20/2018  . Hearing loss 09/20/2018  . History of double vision 09/20/2018  . Fatigue 08/01/2018  . Left shoulder pain 06/30/2018  . Chronic right  shoulder pain 06/14/2018  . Anemia due to chronic blood loss 05/07/2018  . Odynophagia 05/07/2018  . Protein-calorie malnutrition ([place]) 05/07/2018  . Absolute anemia   . Cardiac pacemaker in situ 05/06/2018  . History of anastomosis and bypass hemorrhage 05/06/2018  . Atrial fibrillation with rapid ventricular response ([place])   . Dehydration   . Bilious vomiting with nausea   . Gastrojejunal ulcer   . Acute abdominal pain 05/05/2018  . Rheumatoid arthritis involving both hands ([place]) 09/27/2017  . Seasonal allergies 08/07/2017  . Primary osteoarthritis of right knee 06/02/2017  . Osteoarthritis of right knee 05/30/2017  . Primary osteoarthritis of left hip 02/24/2017  . AAA (abdominal aortic aneurysm) without rupture ([place]) 12/21/2016  . Chronic systolic heart failure ([place]) 12/14/2016  . Atrial fibrillation ([place]) 11/22/2016  . Peripheral neuropathy 11/22/2016  . GERD (gastroesophageal reflux disease) 11/22/2016  . Abdominal pain 11/18/2016  . 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 Parasomnia 11/18/2016    Carney Living  PT DPT  10/09/2018, 4:42 PM  [facility] [address] [place], [place], [address] Phone: [phone]   Fax:  [phone]  Name: Deanna Spencer MRN: [id number] Date of Birth: [birth year]

## 2018-10-11 ENCOUNTER — Ambulatory Visit: Payer: Medicare Other | Attending: Family Medicine | Admitting: Physical Therapy

## 2018-10-11 ENCOUNTER — Telehealth: Payer: Self-pay | Admitting: *Deleted

## 2018-10-11 ENCOUNTER — Other Ambulatory Visit: Payer: Self-pay

## 2018-10-11 ENCOUNTER — Encounter: Payer: Self-pay | Admitting: Physical Therapy

## 2018-10-11 DIAGNOSIS — M542 Cervicalgia: Secondary | ICD-10-CM | POA: Diagnosis not present

## 2018-10-11 DIAGNOSIS — M62838 Other muscle spasm: Secondary | ICD-10-CM | POA: Insufficient documentation

## 2018-10-11 DIAGNOSIS — M25512 Pain in left shoulder: Secondary | ICD-10-CM | POA: Insufficient documentation

## 2018-10-11 DIAGNOSIS — G8929 Other chronic pain: Secondary | ICD-10-CM | POA: Diagnosis not present

## 2018-10-11 NOTE — Telephone Encounter (Signed)
-----   Message from Valerie Roys, [place] sent at 09/19/2018 10:17 AM EDT ----- GERI appt in November. Paperwork given.

## 2018-10-11 NOTE — Therapy (Signed)
[place], [place], [address] Phone: [phone]   Fax:  [phone]  Physical Therapy Treatment  Patient Details  Name: Deanna Spencer MRN: [id number] Date of Birth: 1940-02-15 Referring Provider (PT): Dr Ruthann Cancer Chambliss/ Nuala Alpha DO    Encounter Date: 10/11/2018  PT End of Session - 10/11/18 1119    Visit Number  11    Number of Visits  18    Date for PT Re-Evaluation  11/06/18    Authorization Type  [employer]; progress note at 10 visits    PT Start Time  1115   pt arrived late   PT Stop Time  1154    PT Time Calculation (min)  39 min    Activity Tolerance  Patient tolerated treatment well    Behavior During Therapy  [facility] for tasks assessed/performed       Past Medical History:  Diagnosis Date  . AAA (abdominal aortic aneurysm) ([place])    3.5cm by CT 11/29/16  . AAA (abdominal aortic aneurysm) without rupture ([place]) 12/21/2016   Stable 3cm 4/20, needs f/u imaging ~4/23  . Acute abdominal pain 05/05/2018  . Anemia   . Atrial fibrillation ([place]) 11/22/2016  . Bee sting 06/28/2017  . Cerumen impaction 06/28/2017  . Childhood asthma    connected to allergies  . Chronic lower back pain   . Chronic systolic heart failure ([place]) 12/14/2016  . Diplopia 09/27/2017  . Diverticulosis   . Duodenal ulcer   . Dyspnea   . Dysrhythmia    a fib  . GERD (gastroesophageal reflux disease)   . GI bleed    "have had both upper and lower GIB"  . GI bleed 05/07/2018  . Gout   . Heart attack ([place]) 2017  . Heart murmur   . History of anastomosis and bypass hemorrhage 05/06/2018  . History of blood transfusion 2017-2018   "related to bleeding ulcers; 47 units PRBC; 2U plasma" (11/23/2016)  . History of duodenal ulcer 11/18/2016  . History of heart attack 11/18/2016  . History of hiatal hernia   . Hx of blood clots    in heart  . IBS (irritable bowel syndrome)   . Liver lesion 08/07/2017  . Neuropathy   .  Osteoarthritis   . Osteoarthritis of right knee 05/30/2017  . Osteoporosis   . Other chronic pain 11/18/2016  . Parasomnia 11/18/2016  . Perforated viscus 11/22/2016  . Peripheral neuropathy   . Presence of permanent cardiac pacemaker 10/2015   medtronic; Serial # [id number]  . Presence of Watchman left atrial appendage closure device   . Primary osteoarthritis of left hip 02/24/2017  . 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 Primary osteoarthritis of right knee 06/02/2017  . Raynaud disease   . Rheumatoid arteritis ([place])   . Rheumatoid arthritis involving both hands ([place]) 09/27/2017  . S/P exploratory laparotomy 11/22/2016  . Seasonal allergies 08/07/2017  . 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 SSS (sick sinus syndrome) ([place])    a. s/p Medtronic PPM placement in 2017  . Stomach ulcer   . Vision changes 08/07/2017    Past Surgical History:  Procedure Laterality Date  . ABDOMINAL HYSTERECTOMY    . APPENDECTOMY    . ARTHROPLASTY Right 06/02/2017   RIGHT PATELLA-FEMORAL ARTHROPLASTY, with placement of a cemented 29 mm Stryker asymmetric triathlon patellar button, revision of the tibial polyethylene bearing.  Marland Kitchen BIOPSY  05/06/2018   Procedure: BIOPSY;  Surgeon: Gatha Mayer, MD;  [facility];  Service: Endoscopy;;  . CARPAL TUNNEL RELEASE Bilateral   . CATARACT EXTRACTION W/ INTRAOCULAR LENS  IMPLANT, BILATERAL Bilateral   . CHOLECYSTECTOMY OPEN    . DILATION AND CURETTAGE OF UTERUS    . ESOPHAGOGASTRODUODENOSCOPY (EGD) WITH PROPOFOL N/A 05/06/2018   Procedure: ESOPHAGOGASTRODUODENOSCOPY (EGD) WITH PROPOFOL;  Surgeon: Gatha Mayer, MD;  Location: [place];  Service: Endoscopy;  Laterality: N/A;  . FOOT FUSION Right   . INGUINAL HERNIA REPAIR Right X 2  . INSERT / REPLACE / REMOVE PACEMAKER  2017   medtronic  . JOINT REPLACEMENT    . LAPAROTOMY N/A 11/21/2016   Procedure: EXPLORATORY LAPAROTOMY, REVISION OF  GASTROJEJUNOSTOMY, ESOPHAGOGASTROSCOPY;  Surgeon: Clovis Riley, MD;  Location: [place];  Service: General;  Laterality: N/A;  . LEFT ATRIAL APPENDAGE OCCLUSION  2018   placed  via groin  . LUMBAR FUSION  X 2  . PATELLA-FEMORAL ARTHROPLASTY Right 06/02/2017   Procedure: RIGHT PATELLA-FEMORAL ARTHROPLASTY;  Surgeon: Frederik Pear, MD;  Location: [place];  Service: Orthopedics;  Laterality: Right;  . REVERSE SHOULDER ARTHROPLASTY Left    reverse shoulder replacement  . ROUX-EN-Y GASTRIC BYPASS  2005  . ROUX-EN-Y PROCEDURE  20017 X 2   "revisions"  . TONSILLECTOMY AND ADENOIDECTOMY    . TOTAL HIP ARTHROPLASTY Right 02/24/2017   Procedure: TOTAL HIP ARTHROPLASTY;  Surgeon: Earlie Server, MD;  Location: [facility] [place];  Service: Orthopedics;  Laterality: Right;  . TOTAL KNEE ARTHROPLASTY Bilateral   . TOTAL SHOULDER ARTHROPLASTY Bilateral   . TUBAL LIGATION      There were no vitals filed for this visit.  Subjective Assessment - 10/11/18 1120    Subjective  The bones in my shoulder are hurting because of the weather. Neck is not as stiff as it was.    Patient Stated Goals  to have less pain in her neck and back    Currently in Pain?  Yes    Pain Score  5     Pain Location  Neck    Pain Descriptors / Indicators  Sore   stiff                      [facility] Adult PT Treatment/Exercise - 10/11/18 0001      Shoulder Exercises: Supine   External Rotation Limitations  bil press into loop of  yellow tband    Flexion Limitations  supine AAROM    Other Supine Exercises  biceps curls      Shoulder Exercises: Isometric Strengthening   Extension Limitations  supine on table-elbows at 90      Moist Heat Therapy   Number Minutes Moist Heat  10 Minutes    Moist Heat Location  Cervical      Manual Therapy   Soft tissue mobilization  Lt upper trap    Manual Traction  to cervical spine                PT Short Term Goals - 10/09/18 1407      PT SHORT TERM GOAL #1   Title  Patient will report 2/10 pain at worst with available movement     Baseline  can reach a 9/10 but overall consistently less pain    Time  3    Period  Weeks    Status  On-going    Target Date  11/06/18      PT SHORT TERM GOAL #2   Title  Patient will increase bilateral  cervical rotation by 10 degrees    Baseline  progressing towards goal    Time  3    Period  Weeks    Status  On-going    Target Date  11/06/18      PT SHORT TERM GOAL #3   Title  Patient will restate the importance of proper posture while using her phone.    Baseline  More aware of posture    Period  Weeks    Status  On-going    Target Date  11/06/18        PT Long Term Goals - 08/30/18 1531      PT LONG TERM GOAL #1   Title  Patient will report no pain with the limited function of her left shoulder    Time  6    Period  Weeks    Status  New    Target Date  10/11/18      PT LONG TERM GOAL #2   Title  Patient will increase bilateral cervical rotation to 86 degrees    Time  6    Period  Weeks    Status  New    Target Date  10/11/18      PT LONG TERM GOAL #3   Title  Patient will sleep thorugh the night without increased neck and shoulder pain    Time  6    Period  Weeks    Status  New    Target Date  10/11/18            Plan - 10/11/18 1305    Clinical Impression Statement  Lt shoulder "bones" were hurting today and was unable to continue AAROM due to pain. Feels that STM is what is most helpful. We discussed that strengthening her shoulder is necessary for decreasing some of the pain.    PT Treatment/Interventions  ADLs/Self Care Home Management;Cryotherapy;Electrical Stimulation;Iontophoresis 4mg /ml Dexamethasone;Moist Heat;Ultrasound;DME Instruction;Gait training;Stair training;Functional mobility training;Therapeutic activities;Therapeutic exercise;Neuromuscular re-education;Patient/family education;Manual techniques;Passive range of motion;Taping    PT Next Visit Plan  PACEMAKER    review isometrics at wall, rows    PT Home Exercise Plan  cerivcal  rotation; scpa retraction, left upper trap stretch, seated short arc flx & abd, supine chest press, row anchored, SL ER, biceps curls    Consulted and Agree with Plan of Care  Patient       Patient will benefit from skilled therapeutic intervention in order to improve the following deficits and impairments:  Pain, Decreased mobility, Decreased strength, Decreased activity tolerance, Decreased endurance, Impaired UE functional use, Increased fascial restricitons, Impaired tone  Visit Diagnosis: Cervicalgia  Chronic left shoulder pain  Other muscle spasm     Problem List Patient Active Problem List   Diagnosis Date Noted  . Depression, major, single episode, mild ([place]) 09/20/2018  . Hearing loss 09/20/2018  . History of double vision 09/20/2018  . Fatigue 08/01/2018  . Left shoulder pain 06/30/2018  . Chronic right shoulder pain 06/14/2018  . Anemia due to chronic blood loss 05/07/2018  . Odynophagia 05/07/2018  . Protein-calorie malnutrition ([place]) 05/07/2018  . Absolute anemia   . Cardiac pacemaker in situ 05/06/2018  . History of anastomosis and bypass hemorrhage 05/06/2018  . Atrial fibrillation with rapid ventricular response ([place])   . Dehydration   . Bilious vomiting with nausea   . Gastrojejunal ulcer   . Acute abdominal pain 05/05/2018  . Rheumatoid arthritis involving both hands ([place]) 09/27/2017  . Seasonal allergies 08/07/2017  .  Primary osteoarthritis of right knee 06/02/2017  . Osteoarthritis of right knee 05/30/2017  . Primary osteoarthritis of left hip 02/24/2017  . AAA (abdominal aortic aneurysm) without rupture ([place]) 12/21/2016  . Chronic systolic heart failure ([place]) 12/14/2016  . Atrial fibrillation ([place]) 11/22/2016  . Peripheral neuropathy 11/22/2016  . GERD (gastroesophageal reflux disease) 11/22/2016  . Abdominal pain 11/18/2016  . 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 Parasomnia 11/18/2016    Ailin Rochford C.  Tarisa Paola PT, DPT 10/11/18 1:10 PM   [facility] [address] [place], [place], [address] Phone: [phone]   Fax:  [phone]  Name: Deanna Spencer MRN: [id number] Date of Birth: Jul 11, 1940

## 2018-10-11 NOTE — Telephone Encounter (Signed)
Appt scheduled with GERI clinic on 11-22-2018.  ,CMA

## 2018-10-15 ENCOUNTER — Ambulatory Visit: Payer: Medicare Other | Admitting: Physical Therapy

## 2018-10-15 ENCOUNTER — Encounter: Payer: Self-pay | Admitting: Physical Therapy

## 2018-10-15 ENCOUNTER — Other Ambulatory Visit: Payer: Self-pay | Admitting: Pharmacist

## 2018-10-15 ENCOUNTER — Other Ambulatory Visit: Payer: Self-pay | Admitting: Pharmacy Technician

## 2018-10-15 ENCOUNTER — Other Ambulatory Visit: Payer: Self-pay

## 2018-10-15 DIAGNOSIS — M62838 Other muscle spasm: Secondary | ICD-10-CM | POA: Diagnosis not present

## 2018-10-15 DIAGNOSIS — G8929 Other chronic pain: Secondary | ICD-10-CM

## 2018-10-15 DIAGNOSIS — M25512 Pain in left shoulder: Secondary | ICD-10-CM | POA: Diagnosis not present

## 2018-10-15 DIAGNOSIS — M542 Cervicalgia: Secondary | ICD-10-CM

## 2018-10-15 NOTE — Therapy (Signed)
[employer] [place], [place], [address] Phone: [phone]   Fax:  [phone]  Physical Therapy Treatment  Patient Details  Name: Deanna Spencer MRN: [id number] Date of Birth: 09/25/1940 Referring Provider (PT): Dr Ruthann Cancer Chambliss/ Nuala Alpha DO    Encounter Date: 10/15/2018  PT End of Session - 10/15/18 1047    Visit Number  12    Number of Visits  18    Date for PT Re-Evaluation  11/06/18    Authorization Type  [employer]; progress note at 10 visits    PT Start Time  1030    PT Stop Time  1108    PT Time Calculation (min)  38 min    Activity Tolerance  Patient tolerated treatment well    Behavior During Therapy  [facility] for tasks assessed/performed       Past Medical History:  Diagnosis Date  . AAA (abdominal aortic aneurysm) ([place])    3.5cm by CT 11/29/16  . AAA (abdominal aortic aneurysm) without rupture ([place]) 12/21/2016   Stable 3cm 4/20, needs f/u imaging ~4/23  . Acute abdominal pain 05/05/2018  . Anemia   . Atrial fibrillation ([place]) 11/22/2016  . Bee sting 06/28/2017  . Cerumen impaction 06/28/2017  . Childhood asthma    connected to allergies  . Chronic lower back pain   . Chronic systolic heart failure ([place]) 12/14/2016  . Diplopia 09/27/2017  . Diverticulosis   . Duodenal ulcer   . Dyspnea   . Dysrhythmia    a fib  . GERD (gastroesophageal reflux disease)   . GI bleed    "have had both upper and lower GIB"  . GI bleed 05/07/2018  . Gout   . Heart attack ([place]) 2017  . Heart murmur   . History of anastomosis and bypass hemorrhage 05/06/2018  . History of blood transfusion 2017-2018   "related to bleeding ulcers; 47 units PRBC; 2U plasma" (11/23/2016)  . History of duodenal ulcer 11/18/2016  . History of heart attack 11/18/2016  . History of hiatal hernia   . Hx of blood clots    in heart  . IBS (irritable bowel syndrome)   . Liver lesion 08/07/2017  . Neuropathy   . Osteoarthritis   .  Osteoarthritis of right knee 05/30/2017  . Osteoporosis   . Other chronic pain 11/18/2016  . Parasomnia 11/18/2016  . Perforated viscus 11/22/2016  . Peripheral neuropathy   . Presence of permanent cardiac pacemaker 10/2015   medtronic; Serial # [id number]  . Presence of Watchman left atrial appendage closure device   . Primary osteoarthritis of left hip 02/24/2017  . 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 Primary osteoarthritis of right knee 06/02/2017  . Raynaud disease   . Rheumatoid arteritis ([place])   . Rheumatoid arthritis involving both hands ([place]) 09/27/2017  . S/P exploratory laparotomy 11/22/2016  . Seasonal allergies 08/07/2017  . 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 SSS (sick sinus syndrome) ([place])    a. s/p Medtronic PPM placement in 2017  . Stomach ulcer   . Vision changes 08/07/2017    Past Surgical History:  Procedure Laterality Date  . ABDOMINAL HYSTERECTOMY    . APPENDECTOMY    . ARTHROPLASTY Right 06/02/2017   RIGHT PATELLA-FEMORAL ARTHROPLASTY, with placement of a cemented 29 mm Stryker asymmetric triathlon patellar button, revision of the tibial polyethylene bearing.  Marland Kitchen BIOPSY  05/06/2018   Procedure: BIOPSY;  Surgeon: Gatha Mayer, MD;  Location: [facility];  Service:  Endoscopy;;  . CARPAL TUNNEL RELEASE Bilateral   . CATARACT EXTRACTION W/ INTRAOCULAR LENS  IMPLANT, BILATERAL Bilateral   . CHOLECYSTECTOMY OPEN    . DILATION AND CURETTAGE OF UTERUS    . ESOPHAGOGASTRODUODENOSCOPY (EGD) WITH PROPOFOL N/A 05/06/2018   Procedure: ESOPHAGOGASTRODUODENOSCOPY (EGD) WITH PROPOFOL;  Surgeon: Gatha Mayer, MD;  Location: [facility];  Service: Endoscopy;  Laterality: N/A;  . FOOT FUSION Right   . INGUINAL HERNIA REPAIR Right X 2  . INSERT / REPLACE / REMOVE PACEMAKER  2017   medtronic  . JOINT REPLACEMENT    . LAPAROTOMY N/A 11/21/2016   Procedure: EXPLORATORY LAPAROTOMY, REVISION OF  GASTROJEJUNOSTOMY, ESOPHAGOGASTROSCOPY;  Surgeon: Clovis Riley, MD;   Location: [facility];  Service: General;  Laterality: N/A;  . LEFT ATRIAL APPENDAGE OCCLUSION  2018   placed  via groin  . LUMBAR FUSION  X 2  . PATELLA-FEMORAL ARTHROPLASTY Right 06/02/2017   Procedure: RIGHT PATELLA-FEMORAL ARTHROPLASTY;  Surgeon: Frederik Pear, MD;  Location: [place];  Service: Orthopedics;  Laterality: Right;  . REVERSE SHOULDER ARTHROPLASTY Left    reverse shoulder replacement  . ROUX-EN-Y GASTRIC BYPASS  2005  . ROUX-EN-Y PROCEDURE  20017 X 2   "revisions"  . TONSILLECTOMY AND ADENOIDECTOMY    . TOTAL HIP ARTHROPLASTY Right 02/24/2017   Procedure: TOTAL HIP ARTHROPLASTY;  Surgeon: Earlie Server, MD;  Location: [facility];  Service: Orthopedics;  Laterality: Right;  . TOTAL KNEE ARTHROPLASTY Bilateral   . TOTAL SHOULDER ARTHROPLASTY Bilateral   . TUBAL LIGATION      There were no vitals filed for this visit.  Subjective Assessment - 10/15/18 1046    Subjective  The bones are hurting with the up/down weather.    Patient Stated Goals  to have less pain in her neck and back    Currently in Pain?  Yes    Pain Location  Shoulder    Pain Orientation  Left    Pain Descriptors / Indicators  --   bones hurt                      [facility] Adult PT Treatment/Exercise - 10/15/18 0001      Shoulder Exercises: Seated   Other Seated Exercises  biceps curl 1lb each hand    Other Seated Exercises  yellow therabar 10 smiley, 10 frown      Shoulder Exercises: Pulleys   Flexion  3 minutes      Shoulder Exercises: Isometric Strengthening   Extension Limitations  pressing into back of chair    External Rotation Limitations  resisted by belt      Manual Therapy   Soft tissue mobilization  bil upper traps, suboccipitals    Manual Traction  cervical spine               PT Short Term Goals - 10/09/18 1407      PT SHORT TERM GOAL #1   Title  Patient will report 2/10 pain at worst with available movement    Baseline  can reach a 9/10 but overall consistently  less pain    Time  3    Period  Weeks    Status  On-going    Target Date  11/06/18      PT SHORT TERM GOAL #2   Title  Patient will increase bilateral cervical rotation by 10 degrees    Baseline  progressing towards goal    Time  3    Period  Weeks    Status  On-going    Target Date  11/06/18      PT SHORT TERM GOAL #3   Title  Patient will restate the importance of proper posture while using her phone.    Baseline  More aware of posture    Period  Weeks    Status  On-going    Target Date  11/06/18        PT Long Term Goals - 08/30/18 1531      PT LONG TERM GOAL #1   Title  Patient will report no pain with the limited function of her left shoulder    Time  6    Period  Weeks    Status  New    Target Date  10/11/18      PT LONG TERM GOAL #2   Title  Patient will increase bilateral cervical rotation to 86 degrees    Time  6    Period  Weeks    Status  New    Target Date  10/11/18      PT LONG TERM GOAL #3   Title  Patient will sleep thorugh the night without increased neck and shoulder pain    Time  6    Period  Weeks    Status  New    Target Date  10/11/18            Plan - 10/15/18 1113    Clinical Impression Statement  Notable overuse of cervical region for breathing creating increased tension along with limited mobility of shoulder. PROM WFL using pulleys but demo <45 deg AROM. Reports significant decrease in pain with manual therapy.    PT Treatment/Interventions  ADLs/Self Care Home Management;Cryotherapy;Electrical Stimulation;Iontophoresis 4mg /ml Dexamethasone;Moist Heat;Ultrasound;DME Instruction;Gait training;Stair training;Functional mobility training;Therapeutic activities;Therapeutic exercise;Neuromuscular re-education;Patient/family education;Manual techniques;Passive range of motion;Taping    PT Next Visit Plan  PACEMAKER    wrist/elbow strength for functional grip    PT Home Exercise Plan  cerivcal rotation; scpa retraction, left upper trap  stretch, seated short arc flx & abd, supine chest press, row anchored, SL ER, biceps curls    Consulted and Agree with Plan of Care  Patient       Patient will benefit from skilled therapeutic intervention in order to improve the following deficits and impairments:  Pain, Decreased mobility, Decreased strength, Decreased activity tolerance, Decreased endurance, Impaired UE functional use, Increased fascial restricitons, Impaired tone  Visit Diagnosis: Cervicalgia  Chronic left shoulder pain  Other muscle spasm     Problem List Patient Active Problem List   Diagnosis Date Noted  . Depression, major, single episode, mild ([place]) 09/20/2018  . Hearing loss 09/20/2018  . History of double vision 09/20/2018  . Fatigue 08/01/2018  . Left shoulder pain 06/30/2018  . Chronic right shoulder pain 06/14/2018  . Anemia due to chronic blood loss 05/07/2018  . Odynophagia 05/07/2018  . Protein-calorie malnutrition ([place]) 05/07/2018  . Absolute anemia   . Cardiac pacemaker in situ 05/06/2018  . History of anastomosis and bypass hemorrhage 05/06/2018  . Atrial fibrillation with rapid ventricular response ([place])   . Dehydration   . Bilious vomiting with nausea   . Gastrojejunal ulcer   . Acute abdominal pain 05/05/2018  . Rheumatoid arthritis involving both hands ([place]) 09/27/2017  . Seasonal allergies 08/07/2017  . Primary osteoarthritis of right knee 06/02/2017  . Osteoarthritis of right knee 05/30/2017  . Primary osteoarthritis of left hip 02/24/2017  . AAA (abdominal aortic aneurysm) without  rupture ([place]) 12/21/2016  . Chronic systolic heart failure ([place]) 12/14/2016  . Atrial fibrillation ([place]) 11/22/2016  . Peripheral neuropathy 11/22/2016  . GERD (gastroesophageal reflux disease) 11/22/2016  . Abdominal pain 11/18/2016  . 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 Parasomnia 11/18/2016    Josiephine Simao C. Antron Seth PT, DPT 10/15/18 11:17  AM   [facility] [address] [place], [place], [address] Phone: [phone]   Fax:  [phone]  Name: MEDEA GURTLER MRN: [id number] Date of Birth: January 13, 1940

## 2018-10-15 NOTE — Patient Outreach (Signed)
[place]) Care Management  10/15/2018  Deanna Spencer 03/18/1940 [id number]  Unsuccessful outreach call placed to patient in regards to [place] patient assistance for [place].  Unfortunately patient did not answer the phone, HIPAA compliant voicemail left.  Was returning patient's voicemail that she had left for me and was calling to inform her that she sent back the application unsigned and it was also missing the additional documentation that the patient assistance company requires to process the application.  Will remail the application to the patient along with the letter stating what additional documentation is needed on Tuesday 10/16/2018 when I am back in the office.  Will followup with patient in 3-7 business days.  Deanna Spencer P. [name], [facility] Management [phone]

## 2018-10-15 NOTE — Patient Outreach (Signed)
[facility]) Care Management  10/15/2018  Lashell A Digeronimo [birth year] [id number]   Patient called and said there were problems with her pill packs. She said she had run out of her medications and that the pharmacist at [facility] told her to call me. HIPAA identifiers were obtained.    Called [facility] with the patient on speaker phone.  The representative at [facility] said the patient's medications were delivered in pill packing to her local pharmacy and were available for pick up on 10/10/2018.  Patient said she spoke with the Pharmacist at her local [facility] and he told her that her medications were not there and he had no information about them.  Patient went without some of her medications over the weekend needlessly.  [facility] reported the following medications as being in her packing system:  Gabapentin, calicum, famotidine, sucralfate, and pantoprazole.   Sertraline and Ropinirole were filled at the local pharmacy and were ear marked for the October pill pack.  She said she would go and pick them up today and call me if she had any issues.  Plan: Follow up with the patient in 5-7 business days.  Elayne Guerin, PharmD, [facility] Clinical Pharmacist [phone]

## 2018-10-15 NOTE — Patient Outreach (Signed)
[facility]) Care Management  10/15/2018  Eufelia A Gundlach 1940/09/07 [id number]    ADDENDUM  Incoming call received from patient in regards to [place] application for [place].  Spoke to patient, HIPAA identifiers verified.  Patient informed she had spoken to [place] this morning and was on her way to [facility] to pick [facility] her medication packs.  Informed patient I had received her application back but that she forgot to sign it and send back the additional documentation. Patient informed she was so excited to receive it that in her haste to return it she must have forgotten. Informed patient that I would mail it back out to her on 10/16/2018 and for her to pay particular attention to the orange highlighted areas as that is places I need her to fill in, sign, and information that is needed to be sent back with the application. Patient verbalized understanding.  Will followup with patient in 3-7 days from 10/16/2018 to inquire if she received the information.  Kanoe Wanner P. Eastin Swing, [place] Management [phone]

## 2018-10-16 ENCOUNTER — Ambulatory Visit: Payer: Medicare Other | Admitting: Pharmacist

## 2018-10-17 ENCOUNTER — Other Ambulatory Visit: Payer: Self-pay

## 2018-10-17 MED ORDER — ROPINIROLE HCL 0.5 MG PO TABS: 0.5000 mg | ORAL_TABLET | Freq: Three times a day (TID) | ORAL | 0 refills | Status: DC

## 2018-10-18 ENCOUNTER — Ambulatory Visit: Payer: Medicare Other | Admitting: Physical Therapy

## 2018-10-18 ENCOUNTER — Encounter: Payer: Self-pay | Admitting: Physical Therapy

## 2018-10-18 ENCOUNTER — Other Ambulatory Visit: Payer: Self-pay

## 2018-10-18 DIAGNOSIS — G8929 Other chronic pain: Secondary | ICD-10-CM | POA: Diagnosis not present

## 2018-10-18 DIAGNOSIS — M62838 Other muscle spasm: Secondary | ICD-10-CM | POA: Diagnosis not present

## 2018-10-18 DIAGNOSIS — M25512 Pain in left shoulder: Secondary | ICD-10-CM | POA: Diagnosis not present

## 2018-10-18 DIAGNOSIS — M542 Cervicalgia: Secondary | ICD-10-CM

## 2018-10-18 NOTE — Therapy (Signed)
[place], [place], [address] Phone: [phone]   Fax:  [phone]  Physical Therapy Treatment  Patient Details  Name: Deanna Spencer MRN: [id number] Date of Birth: 06-Mar-1940 Referring Provider (PT): Dr Ruthann Cancer Chambliss/ Nuala Alpha DO    Encounter Date: 10/18/2018  PT End of Session - 10/18/18 1145    Visit Number  13    Number of Visits  18    Date for PT Re-Evaluation  11/06/18    Authorization Type  [employer]; progress note at 10 visits    PT Start Time  1110   pt arrived late   PT Stop Time  1145    PT Time Calculation (min)  35 min    Activity Tolerance  Patient tolerated treatment well       Past Medical History:  Diagnosis Date  . AAA (abdominal aortic aneurysm) ([place])    3.5cm by CT 11/29/16  . AAA (abdominal aortic aneurysm) without rupture ([place]) 12/21/2016   Stable 3cm 4/20, needs f/u imaging ~4/23  . Acute abdominal pain 05/05/2018  . Anemia   . Atrial fibrillation ([place]) 11/22/2016  . Bee sting 06/28/2017  . Cerumen impaction 06/28/2017  . Childhood asthma    connected to allergies  . Chronic lower back pain   . Chronic systolic heart failure ([place]) 12/14/2016  . Diplopia 09/27/2017  . Diverticulosis   . Duodenal ulcer   . Dyspnea   . Dysrhythmia    a fib  . GERD (gastroesophageal reflux disease)   . GI bleed    "have had both upper and lower GIB"  . GI bleed 05/07/2018  . Gout   . Heart attack ([place]) 2017  . Heart murmur   . History of anastomosis and bypass hemorrhage 05/06/2018  . History of blood transfusion 2017-2018   "related to bleeding ulcers; 47 units PRBC; 2U plasma" (11/23/2016)  . History of duodenal ulcer 11/18/2016  . History of heart attack 11/18/2016  . History of hiatal hernia   . Hx of blood clots    in heart  . IBS (irritable bowel syndrome)   . Liver lesion 08/07/2017  . Neuropathy   . Osteoarthritis   . Osteoarthritis of right knee 05/30/2017  .  Osteoporosis   . Other chronic pain 11/18/2016  . Parasomnia 11/18/2016  . Perforated viscus 11/22/2016  . Peripheral neuropathy   . Presence of permanent cardiac pacemaker 10/2015   medtronic; Serial # [id number]  . Presence of Watchman left atrial appendage closure device   . Primary osteoarthritis of left hip 02/24/2017  . 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 Primary osteoarthritis of right knee 06/02/2017  . Raynaud disease   . Rheumatoid arteritis ([place])   . Rheumatoid arthritis involving both hands ([place]) 09/27/2017  . S/P exploratory laparotomy 11/22/2016  . Seasonal allergies 08/07/2017  . 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 SSS (sick sinus syndrome) ([place])    a. s/p Medtronic PPM placement in 2017  . Stomach ulcer   . Vision changes 08/07/2017    Past Surgical History:  Procedure Laterality Date  . ABDOMINAL HYSTERECTOMY    . APPENDECTOMY    . ARTHROPLASTY Right 06/02/2017   RIGHT PATELLA-FEMORAL ARTHROPLASTY, with placement of a cemented 29 mm Stryker asymmetric triathlon patellar button, revision of the tibial polyethylene bearing.  Marland Kitchen BIOPSY  05/06/2018   Procedure: BIOPSY;  Surgeon: Gatha Mayer, MD;  [facility];  Service: Endoscopy;;  . CARPAL TUNNEL RELEASE Bilateral   .  CATARACT EXTRACTION W/ INTRAOCULAR LENS  IMPLANT, BILATERAL Bilateral   . CHOLECYSTECTOMY OPEN    . DILATION AND CURETTAGE OF UTERUS    . ESOPHAGOGASTRODUODENOSCOPY (EGD) WITH PROPOFOL N/A 05/06/2018   Procedure: ESOPHAGOGASTRODUODENOSCOPY (EGD) WITH PROPOFOL;  Surgeon: Gatha Mayer, MD;  Location: [facility];  Service: Endoscopy;  Laterality: N/A;  . FOOT FUSION Right   . INGUINAL HERNIA REPAIR Right X 2  . INSERT / REPLACE / REMOVE PACEMAKER  2017   medtronic  . JOINT REPLACEMENT    . LAPAROTOMY N/A 11/21/2016   Procedure: EXPLORATORY LAPAROTOMY, REVISION OF  GASTROJEJUNOSTOMY, ESOPHAGOGASTROSCOPY;  Surgeon: Clovis Riley, MD;  Location: [facility];  Service: General;   Laterality: N/A;  . LEFT ATRIAL APPENDAGE OCCLUSION  2018   placed  via groin  . LUMBAR FUSION  X 2  . PATELLA-FEMORAL ARTHROPLASTY Right 06/02/2017   Procedure: RIGHT PATELLA-FEMORAL ARTHROPLASTY;  Surgeon: Frederik Pear, MD;  Location: [facility];  Service: Orthopedics;  Laterality: Right;  . REVERSE SHOULDER ARTHROPLASTY Left    reverse shoulder replacement  . ROUX-EN-Y GASTRIC BYPASS  2005  . ROUX-EN-Y PROCEDURE  20017 X 2   "revisions"  . TONSILLECTOMY AND ADENOIDECTOMY    . TOTAL HIP ARTHROPLASTY Right 02/24/2017   Procedure: TOTAL HIP ARTHROPLASTY;  Surgeon: Earlie Server, MD;  Location: [facility];  Service: Orthopedics;  Laterality: Right;  . TOTAL KNEE ARTHROPLASTY Bilateral   . TOTAL SHOULDER ARTHROPLASTY Bilateral   . TUBAL LIGATION      There were no vitals filed for this visit.  Subjective Assessment - 10/18/18 1113    Subjective  I have been busy at the house trying to unpack everything. The left side of my neck hurts, I can't turn my head to the Left. I am getting shooting pains in my Left shoulder. I have been hanging pictures and things on my wall.    Currently in Pain?  Yes    Pain Score  5     Pain Location  Neck    Pain Orientation  Left    Pain Descriptors / Indicators  Sore;Shooting    Pain Radiating Towards  Left shoulder    Aggravating Factors   turning to the Left    Pain Relieving Factors  rest                       [facility] Adult PT Treatment/Exercise - 10/18/18 0001      Neck Exercises: Seated   Neck Retraction Limitations  scap retraction + cervical rotation    Other Seated Exercise  row motion- hands sliding on thighs    Other Seated Exercise  UE ER AROM               PT Short Term Goals - 10/09/18 1407      PT SHORT TERM GOAL #1   Title  Patient will report 2/10 pain at worst with available movement    Baseline  can reach a 9/10 but overall consistently less pain    Time  3    Period  Weeks    Status  On-going    Target Date   11/06/18      PT SHORT TERM GOAL #2   Title  Patient will increase bilateral cervical rotation by 10 degrees    Baseline  progressing towards goal    Time  3    Period  Weeks    Status  On-going    Target Date  11/06/18  PT SHORT TERM GOAL #3   Title  Patient will restate the importance of proper posture while using her phone.    Baseline  More aware of posture    Period  Weeks    Status  On-going    Target Date  11/06/18        PT Long Term Goals - 08/30/18 1531      PT LONG TERM GOAL #1   Title  Patient will report no pain with the limited function of her left shoulder    Time  6    Period  Weeks    Status  New    Target Date  10/11/18      PT LONG TERM GOAL #2   Title  Patient will increase bilateral cervical rotation to 86 degrees    Time  6    Period  Weeks    Status  New    Target Date  10/11/18      PT LONG TERM GOAL #3   Title  Patient will sleep thorugh the night without increased neck and shoulder pain    Time  6    Period  Weeks    Status  New    Target Date  10/11/18            Plan - 10/18/18 1156    Clinical Impression Statement  Continues to have limited tolerance for exercises requiring movmeent of her shoulder. She reports she is finished hanging pictures on her walls at home so hopefully she will stop irritating it so much.    PT Treatment/Interventions  ADLs/Self Care Home Management;Cryotherapy;Electrical Stimulation;Iontophoresis 4mg /ml Dexamethasone;Moist Heat;Ultrasound;DME Instruction;Gait training;Stair training;Functional mobility training;Therapeutic activities;Therapeutic exercise;Neuromuscular re-education;Patient/family education;Manual techniques;Passive range of motion;Taping    PT Next Visit Plan  PACEMAKER    wrist/elbow strength for functional grip    PT Home Exercise Plan  cerivcal rotation; scpa retraction, left upper trap stretch, seated short arc flx & abd, supine chest press, row anchored, SL ER, biceps curls     Consulted and Agree with Plan of Care  Patient       Patient will benefit from skilled therapeutic intervention in order to improve the following deficits and impairments:  Pain, Decreased mobility, Decreased strength, Decreased activity tolerance, Decreased endurance, Impaired UE functional use, Increased fascial restricitons, Impaired tone  Visit Diagnosis: Cervicalgia  Chronic left shoulder pain  Other muscle spasm     Problem List Patient Active Problem List   Diagnosis Date Noted  . Depression, major, single episode, mild ([place]) 09/20/2018  . Hearing loss 09/20/2018  . History of double vision 09/20/2018  . Fatigue 08/01/2018  . Left shoulder pain 06/30/2018  . Chronic right shoulder pain 06/14/2018  . Anemia due to chronic blood loss 05/07/2018  . Odynophagia 05/07/2018  . Protein-calorie malnutrition ([place]) 05/07/2018  . Absolute anemia   . Cardiac pacemaker in situ 05/06/2018  . History of anastomosis and bypass hemorrhage 05/06/2018  . Atrial fibrillation with rapid ventricular response ([place])   . Dehydration   . Bilious vomiting with nausea   . Gastrojejunal ulcer   . Acute abdominal pain 05/05/2018  . Rheumatoid arthritis involving both hands ([place]) 09/27/2017  . Seasonal allergies 08/07/2017  . Primary osteoarthritis of right knee 06/02/2017  . Osteoarthritis of right knee 05/30/2017  . Primary osteoarthritis of left hip 02/24/2017  . AAA (abdominal aortic aneurysm) without rupture ([place]) 12/21/2016  . Chronic systolic heart failure ([place]) 12/14/2016  . Atrial fibrillation ([place]) 11/22/2016  .  Peripheral neuropathy 11/22/2016  . GERD (gastroesophageal reflux disease) 11/22/2016  . Abdominal pain 11/18/2016  . 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 Parasomnia 11/18/2016  Tyrihanna Wingert C. Cecille Mcclusky PT, DPT 10/18/18 11:58 AM   [facility] [address] [place], [place],  [address] Phone: [phone]   Fax:  [phone]  Name: Deanna Spencer MRN: [id number] Date of Birth: 08/04/1940

## 2018-10-22 NOTE — Telephone Encounter (Signed)
Pt returned your call regarding appointments.

## 2018-10-23 ENCOUNTER — Other Ambulatory Visit: Payer: Self-pay | Admitting: Pharmacy Technician

## 2018-10-23 NOTE — Patient Outreach (Signed)
[facility]) Care Management  10/23/2018  [name] 1940-10-14 [id number]   Successful outreach call placed to patient in regards to [employer] application for [place].  Spoke to patient, HIPAA identifiers verified.  Patient informed she has received the application back to sign and fill out the starred items. Patient informed that her daughter has financial information and she would have to wait for her to get the documents together and fill out the form. She informed her daughter was very busy. She also informed that she was having several appointments in the next week. She informed she would try and have the information back to [place] next week, but that she could not guarntee that it would be next week. She informed she fills as though she will be over income.  Will followup with patient in 4 weeks if application has not been returned.  Brocha Gilliam P. Gevorg Brum, [place] Management [phone]

## 2018-10-24 ENCOUNTER — Other Ambulatory Visit: Payer: Self-pay | Admitting: Pharmacist

## 2018-10-24 NOTE — Telephone Encounter (Signed)
Prep instructions reviewed with Deanna Spencer. No further questions at this time.

## 2018-10-24 NOTE — Patient Outreach (Signed)
[facility]) Care Management  10/24/2018  Deanna Spencer 1940-11-03 [id number]   Patient was called to follow up on medication assistance and adherence. HIPAA identifiers were obtained.   Patient is a 78 year old female with multiple medical conditions including but not limited to:  AAA, Atrial Fibrillation status post pacemaker placement, CHF, GERD, osteoarthritis of right knee and both hips, peripheral neuropathy and rheumatoid arthritis.   Patient is receiving her medications in pill pack from [facility].  Reviewed patient's medications with her via telephone.  Medications Reviewed Today    Reviewed by Elayne Guerin, [facility] (Pharmacist) on 10/24/18 at 51  Med List Status: <None>  Medication Order Taking? Sig Documenting Provider Last Dose Status Informant  acetaminophen (TYLENOL) 500 MG tablet [id number] Yes Take 1,000 mg every 6 (six) hours as needed by mouth (pain).  [provider] Taking Active Self        Discontinued [id number] (Duplicate)   Calcium Citrate 200 MG TABS [id number] Yes Take 2.5 tablets (500 mg total) by mouth daily. Nuala Alpha, DO Taking Active   COLCRYS 0.6 MG tablet [id number] Yes TAKE 1 TABLET (0.6 MG TOTAL) BY MOUTH 2 (TWO) TIMES DAILY AS NEEDED (GOUT ATTACKS). Nuala Alpha, DO Taking Active   famotidine (PEPCID) 40 MG tablet [id number] Yes Take 1 tablet (40 mg total) by mouth at bedtime. Nuala Alpha, DO Taking Active   gabapentin (NEURONTIN) 300 MG capsule [id number] Yes Take 1 capsule (300 mg total) by mouth 3 (three) times daily. Nuala Alpha, DO Taking Active   lactose free nutrition (BOOST PLUS) LIQD [id number] Yes Take 237 mLs by mouth 3 (three) times daily with meals. Patriciaann Clan, DO Taking Active   metoprolol succinate (TOPROL-XL) 100 MG 24 hr tablet [id number] Yes Take 1 tablet (100 mg total) by mouth at bedtime. Take with or immediately following a meal. Nuala Alpha, DO Taking Active    multivitamin (PROSIGHT) TABS tablet [id number] Yes Take 1 tablet by mouth 2 (two) times a day. Patriciaann Clan, DO Taking Active   ondansetron (ZOFRAN ODT) 4 MG disintegrating tablet [id number] Yes Take 1 tablet (4 mg total) by mouth every 8 (eight) hours as needed for nausea or vomiting. Gareth Morgan, MD Taking Active   pantoprazole (PROTONIX) 40 MG tablet [id number] Yes Take 1 tablet (40 mg total) by mouth 2 (two) times daily. Nuala Alpha, DO Taking Active   polyethylene glycol (MIRALAX / GLYCOLAX) 17 g packet [id number] Yes Take 17 g by mouth daily. Patriciaann Clan, DO Taking Active            Med Note (CHAMBERS, ELIZABETH A   Tue Jul 03, 2018 12:53 PM) Dewaine Conger when needed  rOPINIRole (REQUIP) 0.5 MG tablet [id number] Yes Take 1 tablet (0.5 mg total) by mouth 3 (three) times daily. Nuala Alpha, DO Taking Active   sertraline (ZOLOFT) 50 MG tablet [id number] Yes Take 1 tablet (50 mg total) by mouth daily. Nuala Alpha, DO Taking Active   sucralfate (CARAFATE) 1 g tablet [id number] Yes Take 1 tablet (1 g total) by mouth every 6 (six) hours. Nuala Alpha, DO Taking Active   tolterodine (DETROL LA) 4 MG 24 hr capsule [id number] Yes Take 1 capsule (4 mg total) by mouth daily. Nuala Alpha, DO Taking Active          We have been working on getting the patient approved to receive Detrol LA from [employer] Patient Assistance form. We are waiting on the patient to send  financial documentation so her application can be submitted.   Patient did not report any issues with her pil packs.  Plan: Call patient back in 3-4 weeks. Sharee Pimple will continue to follow patient for patient assistance.  Elayne Guerin, PharmD, [place] Clinical Pharmacist [phone]

## 2018-10-24 NOTE — Telephone Encounter (Signed)
Left message to return call 

## 2018-10-26 ENCOUNTER — Other Ambulatory Visit (HOSPITAL_COMMUNITY)
Admission: RE | Admit: 2018-10-26 | Discharge: 2018-10-26 | Disposition: A | Discharge status: Home / Self Care | Payer: Medicare Other | Source: Ambulatory Visit | Attending: Gastroenterology | Admitting: Gastroenterology

## 2018-10-26 DIAGNOSIS — Z20828 Contact with and (suspected) exposure to other viral communicable diseases: Secondary | ICD-10-CM | POA: Insufficient documentation

## 2018-10-28 LAB — NOVEL CORONAVIRUS, NAA (HOSP ORDER, SEND-OUT TO REF LAB; TAT 18-24 HRS): SARS-CoV-2, NAA: NOT DETECTED

## 2018-10-29 ENCOUNTER — Other Ambulatory Visit: Payer: Self-pay | Admitting: *Deleted

## 2018-10-29 ENCOUNTER — Other Ambulatory Visit: Payer: Self-pay | Admitting: Family Medicine

## 2018-10-29 ENCOUNTER — Telehealth: Payer: Self-pay | Admitting: Gastroenterology

## 2018-10-29 ENCOUNTER — Other Ambulatory Visit: Payer: Self-pay

## 2018-10-29 ENCOUNTER — Encounter (HOSPITAL_COMMUNITY): Payer: Self-pay | Admitting: Emergency Medicine

## 2018-10-29 NOTE — Progress Notes (Signed)
Pre-op endo call completed.  Patient with hx of  Afib , LOV with cardiology see epic 08-17-2018 Today during pre-op call patient reports she only experience palpitations with moderate activity. She reports at time of call that she is  at the grocery store and denies any sob or palpitations at this time .  RN Consulted with APP and reported above info. APP reviewed LOV  with cards and gleans that patient was stable at that time and is aware that patient not c/o of any cardiac symptoms at time of pre-op phone call.

## 2018-10-29 NOTE — Progress Notes (Signed)
Pt confirmed that she has been able to quarantine and has not had any fevers or flu like symptoms and has not been around anyone sick. Pt confirms appointment time to be here 0900

## 2018-10-29 NOTE — Telephone Encounter (Signed)
Prep instruction reviewed with patient. She has lost her paper copy. I also sent her a mychart copy of the instructions.

## 2018-10-29 NOTE — Progress Notes (Signed)
Device orders faxed to [facility], requested   fax back to [facility] [phone]

## 2018-10-30 ENCOUNTER — Ambulatory Visit (HOSPITAL_COMMUNITY): Payer: Medicare Other | Admitting: Physician Assistant

## 2018-10-30 ENCOUNTER — Other Ambulatory Visit: Payer: Self-pay

## 2018-10-30 ENCOUNTER — Encounter (HOSPITAL_COMMUNITY): Payer: Self-pay | Admitting: Anesthesiology

## 2018-10-30 ENCOUNTER — Ambulatory Visit (HOSPITAL_COMMUNITY)
Admission: RE | Admit: 2018-10-30 | Discharge: 2018-10-30 | Disposition: A | Discharge status: Home / Self Care | Payer: Medicare Other | Attending: Gastroenterology | Admitting: Gastroenterology

## 2018-10-30 ENCOUNTER — Encounter (HOSPITAL_COMMUNITY)
Admission: RE | Disposition: A | Discharge status: Home / Self Care | Payer: Self-pay | Source: Home / Self Care | Attending: Gastroenterology

## 2018-10-30 DIAGNOSIS — I739 Peripheral vascular disease, unspecified: Secondary | ICD-10-CM | POA: Insufficient documentation

## 2018-10-30 DIAGNOSIS — Z9884 Bariatric surgery status: Secondary | ICD-10-CM | POA: Insufficient documentation

## 2018-10-30 DIAGNOSIS — K219 Gastro-esophageal reflux disease without esophagitis: Secondary | ICD-10-CM | POA: Diagnosis not present

## 2018-10-30 DIAGNOSIS — M109 Gout, unspecified: Secondary | ICD-10-CM | POA: Insufficient documentation

## 2018-10-30 DIAGNOSIS — I4891 Unspecified atrial fibrillation: Secondary | ICD-10-CM | POA: Insufficient documentation

## 2018-10-30 DIAGNOSIS — G629 Polyneuropathy, unspecified: Secondary | ICD-10-CM | POA: Diagnosis not present

## 2018-10-30 DIAGNOSIS — K289 Gastrojejunal ulcer, unspecified as acute or chronic, without hemorrhage or perforation: Secondary | ICD-10-CM

## 2018-10-30 DIAGNOSIS — K284 Chronic or unspecified gastrojejunal ulcer with hemorrhage: Secondary | ICD-10-CM | POA: Diagnosis not present

## 2018-10-30 DIAGNOSIS — R1032 Left lower quadrant pain: Secondary | ICD-10-CM | POA: Diagnosis not present

## 2018-10-30 DIAGNOSIS — Z96611 Presence of right artificial shoulder joint: Secondary | ICD-10-CM | POA: Insufficient documentation

## 2018-10-30 DIAGNOSIS — G8929 Other chronic pain: Secondary | ICD-10-CM | POA: Insufficient documentation

## 2018-10-30 DIAGNOSIS — K529 Noninfective gastroenteritis and colitis, unspecified: Secondary | ICD-10-CM

## 2018-10-30 DIAGNOSIS — Z950 Presence of cardiac pacemaker: Secondary | ICD-10-CM | POA: Diagnosis not present

## 2018-10-30 DIAGNOSIS — T85898A Other specified complication of other internal prosthetic devices, implants and grafts, initial encounter: Secondary | ICD-10-CM

## 2018-10-30 DIAGNOSIS — I495 Sick sinus syndrome: Secondary | ICD-10-CM | POA: Insufficient documentation

## 2018-10-30 DIAGNOSIS — Q438 Other specified congenital malformations of intestine: Secondary | ICD-10-CM | POA: Diagnosis not present

## 2018-10-30 DIAGNOSIS — M545 Low back pain: Secondary | ICD-10-CM | POA: Diagnosis not present

## 2018-10-30 DIAGNOSIS — K6289 Other specified diseases of anus and rectum: Secondary | ICD-10-CM

## 2018-10-30 DIAGNOSIS — K9189 Other postprocedural complications and disorders of digestive system: Secondary | ICD-10-CM | POA: Insufficient documentation

## 2018-10-30 DIAGNOSIS — I5022 Chronic systolic (congestive) heart failure: Secondary | ICD-10-CM | POA: Diagnosis not present

## 2018-10-30 DIAGNOSIS — Z96612 Presence of left artificial shoulder joint: Secondary | ICD-10-CM | POA: Diagnosis not present

## 2018-10-30 DIAGNOSIS — I714 Abdominal aortic aneurysm, without rupture: Secondary | ICD-10-CM | POA: Insufficient documentation

## 2018-10-30 DIAGNOSIS — I252 Old myocardial infarction: Secondary | ICD-10-CM | POA: Diagnosis not present

## 2018-10-30 DIAGNOSIS — K5730 Diverticulosis of large intestine without perforation or abscess without bleeding: Secondary | ICD-10-CM | POA: Diagnosis not present

## 2018-10-30 DIAGNOSIS — Z96641 Presence of right artificial hip joint: Secondary | ICD-10-CM | POA: Insufficient documentation

## 2018-10-30 DIAGNOSIS — Y832 Surgical operation with anastomosis, bypass or graft as the cause of abnormal reaction of the patient, or of later complication, without mention of misadventure at the time of the procedure: Secondary | ICD-10-CM | POA: Insufficient documentation

## 2018-10-30 DIAGNOSIS — K254 Chronic or unspecified gastric ulcer with hemorrhage: Secondary | ICD-10-CM

## 2018-10-30 DIAGNOSIS — Z96653 Presence of artificial knee joint, bilateral: Secondary | ICD-10-CM | POA: Diagnosis not present

## 2018-10-30 DIAGNOSIS — Z09 Encounter for follow-up examination after completed treatment for conditions other than malignant neoplasm: Secondary | ICD-10-CM | POA: Diagnosis present

## 2018-10-30 DIAGNOSIS — R634 Abnormal weight loss: Secondary | ICD-10-CM

## 2018-10-30 HISTORY — PX: ESOPHAGOGASTRODUODENOSCOPY (EGD) WITH PROPOFOL: SHX5813

## 2018-10-30 HISTORY — PX: COLONOSCOPY WITH PROPOFOL: SHX5780

## 2018-10-30 SURGERY — ESOPHAGOGASTRODUODENOSCOPY (EGD) WITH PROPOFOL
Anesthesia: Monitor Anesthesia Care

## 2018-10-30 MED ORDER — SODIUM CHLORIDE 0.9 % IV SOLN: INTRAVENOUS | Status: DC

## 2018-10-30 MED ORDER — LACTATED RINGERS IV SOLN
INTRAVENOUS | Status: DC | PRN
  Administered 2018-10-30: 09:00:00 via INTRAVENOUS

## 2018-10-30 MED ORDER — LACTATED RINGERS IV SOLN: INTRAVENOUS | Status: DC

## 2018-10-30 MED ORDER — PROPOFOL 500 MG/50ML IV EMUL
INTRAVENOUS | Status: DC | PRN
  Administered 2018-10-30: 40 mg via INTRAVENOUS

## 2018-10-30 MED ORDER — PROPOFOL 500 MG/50ML IV EMUL
INTRAVENOUS | Status: DC | PRN
  Administered 2018-10-30: 125 ug/kg/min via INTRAVENOUS

## 2018-10-30 SURGICAL SUPPLY — 25 items

## 2018-10-30 NOTE — Discharge Instructions (Signed)
YOU HAD AN ENDOSCOPIC PROCEDURE TODAY: Refer to the procedure report and other information in the discharge instructions given to you for any specific questions about what was found during the examination. If this information does not answer your questions, please call [place] office at [phone] to clarify.  ° °YOU SHOULD EXPECT: Some feelings of bloating in the abdomen. Passage of more gas than usual. Walking can help get rid of the air that was put into your GI tract during the procedure and reduce the bloating. If you had a lower endoscopy (such as a colonoscopy or flexible sigmoidoscopy) you may notice spotting of blood in your stool or on the toilet paper. Some abdominal soreness may be present for a day or two, also. ° °DIET: Your first meal following the procedure should be a light meal and then it is ok to progress to your normal diet. A half-sandwich or bowl of soup is an example of a good first meal. Heavy or fried foods are harder to digest and may make you feel nauseous or bloated. Drink plenty of fluids but you should avoid alcoholic beverages for 24 hours. If you had a esophageal dilation, please see attached instructions for diet.   ° °ACTIVITY: Your care partner should take you home directly after the procedure. You should plan to take it easy, moving slowly for the rest of the day. You can resume normal activity the day after the procedure however YOU SHOULD NOT DRIVE, use power tools, machinery or perform tasks that involve climbing or major physical exertion for 24 hours (because of the sedation medicines used during the test).  ° °SYMPTOMS TO REPORT IMMEDIATELY: °A gastroenterologist can be reached at any hour. Please call [phone]  for any of the following symptoms:  °Following lower endoscopy (colonoscopy, flexible sigmoidoscopy) °Excessive amounts of blood in the stool  °Significant tenderness, worsening of abdominal pains  °Swelling of the abdomen that is new, acute  °Fever of 100° or  higher  °Following upper endoscopy (EGD, EUS, ERCP, esophageal dilation) °Vomiting of blood or coffee ground material  °New, significant abdominal pain  °New, significant chest pain or pain under the shoulder blades  °Painful or persistently difficult swallowing  °New shortness of breath  °Black, tarry-looking or red, bloody stools ° °FOLLOW UP:  °If any biopsies were taken you will be contacted by phone or by letter within the next 1-3 weeks. Call [phone]  if you have not heard about the biopsies in 3 weeks.  °Please also call with any specific questions about appointments or follow up tests. ° °

## 2018-10-30 NOTE — Interval H&P Note (Signed)
History and Physical Interval Note:  10/30/2018 9:40 AM  Deanna Spencer  has presented today for surgery, with the diagnosis of Lower abd pain, chronic diarrhea, gastric ulcer, weight loss tt.  The various methods of treatment have been discussed with the patient and family. After consideration of risks, benefits and other options for treatment, the patient has consented to  Procedure(s): ESOPHAGOGASTRODUODENOSCOPY (EGD) WITH PROPOFOL (N/A) COLONOSCOPY WITH PROPOFOL (N/A) as a surgical intervention.  The patient's history has been reviewed, patient examined, no change in status, stable for surgery.  I have reviewed the patient's chart and labs.  Questions were answered to the patient's satisfaction.     Nelida Meuse III

## 2018-10-30 NOTE — H&P (Signed)
History:  This patient presents for endoscopic testing for GBP ulcer evaluation, LLQ pain.  Deanna Spencer Referring physician: Nuala Alpha, DO  Past Medical History: Past Medical History:  Diagnosis Date  . AAA (abdominal aortic aneurysm) ([place])    3.5cm by CT 11/29/16  . AAA (abdominal aortic aneurysm) without rupture ([place]) 12/21/2016   Stable 3cm 4/20, needs f/u imaging ~4/23  . Acute abdominal pain 05/05/2018  . Anemia   . Atrial fibrillation ([place]) 11/22/2016  . Bee sting 06/28/2017  . Cerumen impaction 06/28/2017  . Childhood asthma    connected to allergies  . Chronic lower back pain   . Chronic systolic heart failure ([place]) 12/14/2016  . Diplopia 09/27/2017  . Diverticulosis   . Duodenal ulcer   . Dyspnea   . Dysrhythmia    a fib  . GERD (gastroesophageal reflux disease)   . GI bleed    "have had both upper and lower GIB"  . GI bleed 05/07/2018  . Gout   . Heart attack ([place]) 2017  . Heart murmur   . History of anastomosis and bypass hemorrhage 05/06/2018  . History of blood transfusion 2017-2018   "related to bleeding ulcers; 47 units PRBC; 2U plasma" (11/23/2016)  . History of duodenal ulcer 11/18/2016  . History of heart attack 11/18/2016  . History of hiatal hernia   . Hx of blood clots    in heart  . IBS (irritable bowel syndrome)   . Liver lesion 08/07/2017  . Neuropathy   . Osteoarthritis   . Osteoarthritis of right knee 05/30/2017  . Osteoporosis   . Other chronic pain 11/18/2016  . Parasomnia 11/18/2016  . Perforated viscus 11/22/2016  . Peripheral neuropathy   . 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 Presence of Watchman left atrial appendage closure device   . Primary osteoarthritis of left hip 02/24/2017  . 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 Primary osteoarthritis of right knee 06/02/2017  . Raynaud disease   . Rheumatoid arteritis ([place])   . Rheumatoid arthritis involving both hands ([place])  09/27/2017  . S/P exploratory laparotomy 11/22/2016  . Seasonal allergies 08/07/2017  . 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 SSS (sick sinus syndrome) ([place])    a. s/p Medtronic PPM placement in 2017  . Stomach ulcer   . Vision changes 08/07/2017     Past Surgical History: Past Surgical History:  Procedure Laterality Date  . ABDOMINAL HYSTERECTOMY    . APPENDECTOMY    . ARTHROPLASTY Right 06/02/2017   RIGHT PATELLA-FEMORAL ARTHROPLASTY, with placement of a cemented 29 mm Stryker asymmetric triathlon patellar button, revision of the tibial polyethylene bearing.  Marland Kitchen BIOPSY  05/06/2018   Procedure: BIOPSY;  Surgeon: Gatha Mayer, MD;  Location: [facility];  Service: Endoscopy;;  . CARPAL TUNNEL RELEASE Bilateral   . CATARACT EXTRACTION W/ INTRAOCULAR LENS  IMPLANT, BILATERAL Bilateral   . CHOLECYSTECTOMY OPEN    . DILATION AND CURETTAGE OF UTERUS    . ESOPHAGOGASTRODUODENOSCOPY (EGD) WITH PROPOFOL N/A 05/06/2018   Procedure: ESOPHAGOGASTRODUODENOSCOPY (EGD) WITH PROPOFOL;  Surgeon: Gatha Mayer, MD;  Location: [facility];  Service: Endoscopy;  Laterality: N/A;  . FOOT FUSION Right   . INGUINAL HERNIA REPAIR Right X 2  . INSERT / REPLACE / REMOVE PACEMAKER  2017   medtronic  . JOINT REPLACEMENT    . LAPAROTOMY N/A 11/21/2016   Procedure: EXPLORATORY LAPAROTOMY, REVISION OF  GASTROJEJUNOSTOMY, ESOPHAGOGASTROSCOPY;  Surgeon: Clovis Riley,  MD;  Location: [facility];  Service: General;  Laterality: N/A;  . LEFT ATRIAL APPENDAGE OCCLUSION  2018   placed  via groin  . LUMBAR FUSION  X 2  . PATELLA-FEMORAL ARTHROPLASTY Right 06/02/2017   Procedure: RIGHT PATELLA-FEMORAL ARTHROPLASTY;  Surgeon: Frederik Pear, MD;  Location: [facility];  Service: Orthopedics;  Laterality: Right;  . REVERSE SHOULDER ARTHROPLASTY Left    reverse shoulder replacement  . ROUX-EN-Y GASTRIC BYPASS  2005  . ROUX-EN-Y PROCEDURE  20017 X 2   "revisions"  . TONSILLECTOMY AND ADENOIDECTOMY    . TOTAL HIP ARTHROPLASTY Right  02/24/2017   Procedure: TOTAL HIP ARTHROPLASTY;  Surgeon: Earlie Server, MD;  Location: [facility];  Service: Orthopedics;  Laterality: Right;  . TOTAL KNEE ARTHROPLASTY Bilateral   . TOTAL SHOULDER ARTHROPLASTY Bilateral   . TUBAL LIGATION      Allergies: Allergies  Allergen Reactions  . Demerol [Meperidine] Anaphylaxis  . Nsaids     Patient has had MULTIPLE revisions of her gastrojejunostomy for marginal ulcers including recent perforation    Outpatient Meds: Current Facility-Administered Medications  Medication Dose Route Frequency Provider Last Rate Last Dose  . 0.9 %  sodium chloride infusion   Intravenous Continuous Danis, Estill Cotta III, MD      . lactated ringers infusion   Intravenous Continuous Danis, Kirke Corin, MD       Facility-Administered Medications Ordered in Other Encounters  Medication Dose Route Frequency Provider Last Rate Last Dose  . lactated ringers infusion    Continuous PRN Key, Production designer, theatre/television/film, CRNA          ___________________________________________________________________ Objective   Exam:  BP (!) 149/109   Pulse 88   Temp 97.8 F (36.6 C) (Oral)   Resp 20   Ht 4\' 11"  (1.499 m)   Wt 51.3 kg   SpO2 100%   BMI 22.82 kg/m    CV: RRR without murmur, S1/S2, no JVD, no peripheral edema  Resp: clear to auscultation bilaterally, normal RR and effort noted  GI: soft, + LLQ tenderness, with active bowel sounds. No guarding or palpable organomegaly noted.  Neuro: awake, alert and oriented x 3. Normal gross motor function and fluent speech   Assessment:  See above  Plan:  EGD and colonoscopy   Nelida Meuse III

## 2018-10-30 NOTE — Anesthesia Postprocedure Evaluation (Signed)
Anesthesia Post Note  Patient: Deanna Spencer  Procedure(s) Performed: ESOPHAGOGASTRODUODENOSCOPY (EGD) WITH PROPOFOL (N/A ) COLONOSCOPY WITH PROPOFOL (N/A )     Patient location during evaluation: Endoscopy Anesthesia Type: MAC Level of consciousness: awake and alert Pain management: pain level controlled Vital Signs Assessment: post-procedure vital signs reviewed and stable Respiratory status: spontaneous breathing, nonlabored ventilation and respiratory function stable Cardiovascular status: stable and blood pressure returned to baseline Postop Assessment: no apparent nausea or vomiting Anesthetic complications: no    Last Vitals:  Vitals:   10/30/18 1130 10/30/18 1145  BP: (!) 164/81 (!) 175/78  Pulse: 71 80  Resp: 14 14  Temp:    SpO2: 92% 95%    Last Pain:  Vitals:   10/30/18 1055  TempSrc: Temporal  PainSc: [place]

## 2018-10-30 NOTE — Transfer of Care (Signed)
Immediate Anesthesia Transfer of Care Note  Patient: Deanna Spencer  Procedure(s) Performed: Procedure(s): ESOPHAGOGASTRODUODENOSCOPY (EGD) WITH PROPOFOL (N/A) COLONOSCOPY WITH PROPOFOL (N/A)  Patient Location: PACU  Anesthesia Type:MAC  Level of Consciousness:  sedated, patient cooperative and responds to stimulation  Airway & Oxygen Therapy:Patient Spontanous Breathing and Patient connected to face mask oxgen  Post-op Assessment:  Report given to PACU RN and Post -op Vital signs reviewed and stable  Post vital signs:  Reviewed and stable  Last Vitals:  Vitals:   10/30/18 0932  BP: (!) 149/109  Pulse: 88  Resp: 20  Temp: 36.6 C  SpO2: 681%    Complications: No apparent anesthesia complications

## 2018-10-30 NOTE — Op Note (Signed)
[facility] Patient Name: Deanna Spencer Procedure Date: 10/30/2018 MRN: [id number] Attending MD: Estill Cotta. Danis , MD Date of Birth: 1940/07/22 CSN: [id number] Age: 78 Admit Type: Outpatient Procedure:                Colonoscopy Indications:              Abdominal pain in the left lower quadrant (chronic,                            complex history of multiple surgeries for revision                            of GBP for recurrent bleeding and perforations. LLQ                            pain has not improved with multiple trials of                            empiric Abx for diverticulitis) Providers:                Mallie Mussel L. Loletha Carrow, MD, Grace Isaac, RN, Cherylynn Ridges, Technician Referring MD:             Nuala Alpha, DO Medicines:                Monitored Anesthesia Care Complications:            No immediate complications. Estimated Blood Loss:     Estimated blood loss: none. Procedure:                Pre-Anesthesia Assessment:                           - Prior to the procedure, a History and Physical                            was performed, and patient medications and                            allergies were reviewed. The patient's tolerance of                            previous anesthesia was also reviewed. The risks                            and benefits of the procedure and the sedation                            options and risks were discussed with the patient.                            All questions were answered, and informed consent  was obtained. Prior Anticoagulants: The patient has                            taken no previous anticoagulant or antiplatelet                            agents. ASA Grade Assessment: III - A patient with                            severe systemic disease. After reviewing the risks                            and benefits, the patient was deemed in     satisfactory condition to undergo the procedure.                           After obtaining informed consent, the colonoscope                            was passed under direct vision. Throughout the                            procedure, the patient's blood pressure, pulse, and                            oxygen saturations were monitored continuously. The                            [id number]-[id number] Olympus pediatric colonscope                            was introduced through the anus and advanced to the                            the cecum, identified by appendiceal orifice and                            ileocecal valve. The colonoscopy was somewhat                            difficult due to multiple diverticula in the colon,                            significant looping and a tortuous colon.                            Successful completion of the procedure was aided by                            using manual pressure and combination CO2/water                            inflation. The patient tolerated the procedure  well. The quality of the bowel preparation was fair                            (retained diverticular stool balls). The ileocecal                            valve, appendiceal orifice, and rectum were                            photographed. Scope In: 10:12:35 AM Scope Out: 10:30:19 AM Scope Withdrawal Time: 0 hours 7 minutes 55 seconds  Total Procedure Duration: 0 hours 17 minutes 44 seconds  Findings:      The digital rectal exam findings include decreased sphincter tone.      Many diverticula were found in the entire colon.      The colon (entire examined portion) was significantly tortuous and       redundant.      Retroflexion in the rectum was not performed due to anatomy.      The exam was otherwise without abnormality. Impression:               - Preparation of the colon was fair.                           - Decreased sphincter tone  found on digital rectal                            exam.                           - Diverticulosis in the entire examined colon.                           - Tortuous colon.                           - The examination was otherwise normal.                           - No specimens collected.                           This chronic abdominal pain is suspected to be due                            to adhesions from multiple surgeries. Moderate Sedation:      Not Applicable - Patient had care per Anesthesia. Recommendation:           - Patient has a contact number available for                            emergencies. The signs and symptoms of potential                            delayed complications were discussed with the  patient. Return to normal activities tomorrow.                            Written discharge instructions were provided to the                            patient.                           - Low fiber diet.                           - See the other procedure note for documentation of                            additional recommendations.                           - Continue present medications.                           - No repeat routine/screening colonoscopy. Procedure Code(s):        --- Professional ---                           [phone], Colonoscopy, flexible; diagnostic, including                            collection of specimen(s) by brushing or washing,                            when performed (separate procedure) Diagnosis Code(s):        --- Professional ---                           K62.89, Other specified diseases of anus and rectum                           R10.32, Left lower quadrant pain                           K57.30, Diverticulosis of large intestine without                            perforation or abscess without bleeding                           Q43.8, Other specified congenital malformations of                             intestine CPT copyright 2019 American Medical Association. All rights reserved. The codes documented in this report are preliminary and upon coder review may  be revised to meet current compliance requirements. Alejandro Gamel L. Loletha Carrow, MD 10/30/2018 10:55:21 AM This report has been signed electronically. Number of Addenda: 0

## 2018-10-30 NOTE — Op Note (Signed)
[facility] Patient Name: Deanna Spencer Procedure Date: 10/30/2018 MRN: [id number] Attending MD: Estill Cotta. 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 Most recent upper                            scope April 2020) Providers:                Estill Cotta. Loletha Carrow, MD, Grace Isaac, RN, Cherylynn Ridges, Technician, Arnoldo Hooker, CRNA Referring MD:              Medicines:                Monitored Anesthesia Care Complications:            No immediate complications. Estimated Blood Loss:     Estimated blood loss: none. Procedure:                Pre-Anesthesia Assessment:                           - Prior to the procedure, a History and Physical                            was performed, and patient medications and                            allergies were reviewed. The patient's tolerance of                            previous anesthesia was also reviewed. The risks                            and benefits of the procedure and the sedation                            options and risks were discussed with the patient.                            All questions were answered, and informed consent  was obtained. Prior Anticoagulants: The patient has                            taken no previous anticoagulant or antiplatelet                            agents. ASA Grade Assessment: III - A patient with                            severe systemic disease. After reviewing the risks                            and benefits, the patient was deemed in   satisfactory condition to undergo the procedure.                           After obtaining informed consent, the endoscope was                            passed under direct vision. Throughout the                            procedure, the patient's blood pressure, pulse, and                            oxygen saturations were monitored continuously. The                            [id number]-H190 [id number] Olympus gastroscope was                            introduced through the mouth, and advanced to the                            jejunum. The upper GI endoscopy was accomplished                            without difficulty. The patient tolerated the                            procedure well. Scope In: Scope Out: Findings:      The esophagus was normal.      Evidence of a gastric bypass was found. A gastric pouch with a normal       size was found containing fibrous food debris (which limited mucosal       visualization). The gastrojejunal anastomosis was characterized by       ulceration, though it was much improved compared with last exam. The       short blind jejunal pouch was seen as before, but the anastomosis could       not be traversed into the more distal jejunum due to stricture (standard       9.74mm EGD scope).      The examined jejunal pouch was normal. Impression:               - Normal esophagus.                           -  Gastric bypass with a normal-sized pouch.                            Gastrojejunal anastomosis characterized by                            ulceration.                           - Normal examined jejunum.                           - No specimens collected. Moderate Sedation:      Not Applicable - Patient had care per Anesthesia. Recommendation:           - Patient has a contact number available for                            emergencies. The signs and symptoms of potential                            delayed complications were discussed with the                             patient. Return to normal activities tomorrow.                            Written discharge instructions were provided to the                            patient.                           - Low fiber diet because of stricture. Also have                            three liquid protein-calorie supplements per day                            (i.e. Boost, Ensure)                           - Continue present medications.                           - See the other procedure note for documentation of                            additional recommendations. Procedure Code(s):        --- Professional ---                           [phone], Esophagogastroduodenoscopy, flexible,                            transoral; diagnostic, including collection of  specimen(s) by brushing or washing, when performed                            (separate procedure) Diagnosis Code(s):        --- Professional ---                           K28.9, Gastrojejunal ulcer, unspecified as acute or                            chronic, without hemorrhage or perforation                           K25.4, Chronic or unspecified gastric ulcer with                            hemorrhage CPT copyright 2019 American Medical Association. All rights reserved. The codes documented in this report are preliminary and upon coder review may  be revised to meet current compliance requirements. Henry L. Loletha Carrow, MD 10/30/2018 10:44:39 AM This report has been signed electronically. Number of Addenda: 0

## 2018-10-30 NOTE — Anesthesia Preprocedure Evaluation (Signed)
Anesthesia Evaluation  Patient identified by MRN, date of birth, ID band Patient awake    Reviewed: Allergy & Precautions, NPO status , Patient's Chart, lab work & pertinent test results  Airway Mallampati: II  TM Distance: >3 FB Neck ROM: Full    Dental no notable dental hx.    Pulmonary neg pulmonary ROS,    Pulmonary exam normal breath sounds clear to auscultation       Cardiovascular + Past MI and + Peripheral Vascular Disease  + dysrhythmias Atrial Fibrillation + pacemaker  Rhythm:Irregular Rate:Normal     Neuro/Psych negative neurological ROS  negative psych ROS   GI/Hepatic Neg liver ROS, PUD, GERD  ,  Endo/Other  negative endocrine ROS  Renal/GU negative Renal ROS  negative genitourinary   Musculoskeletal negative musculoskeletal ROS (+)   Abdominal   Peds negative pediatric ROS (+)  Hematology  (+) anemia ,   Anesthesia Other Findings   Reproductive/Obstetrics negative OB ROS                             Anesthesia Physical  Anesthesia Plan  ASA: III  Anesthesia Plan: MAC   Post-op Pain Management:    Induction: Intravenous  PONV Risk Score and Plan: 2 and Ondansetron, Midazolam and Treatment may vary due to age or medical condition  Airway Management Planned: Simple Face Mask  Additional Equipment:   Intra-op Plan:   Post-operative Plan:   Informed Consent: I have reviewed the patients History and Physical, chart, labs and discussed the procedure including the risks, benefits and alternatives for the proposed anesthesia with the patient or authorized representative who has indicated his/her understanding and acceptance.     Dental advisory given  Plan Discussed with: CRNA  Anesthesia Plan Comments:         Anesthesia Quick Evaluation

## 2018-10-31 ENCOUNTER — Encounter (HOSPITAL_COMMUNITY): Payer: Self-pay | Admitting: Gastroenterology

## 2018-10-31 MED ORDER — TRAMADOL HCL 50 MG PO TABS: 50.0000 mg | ORAL_TABLET | Freq: Four times a day (QID) | ORAL | 0 refills | Status: DC | PRN

## 2018-11-05 ENCOUNTER — Ambulatory Visit: Payer: Medicare Other | Admitting: Physical Therapy

## 2018-11-06 ENCOUNTER — Other Ambulatory Visit: Payer: Self-pay | Admitting: *Deleted

## 2018-11-06 ENCOUNTER — Ambulatory Visit: Payer: Medicare Other | Admitting: Physical Therapy

## 2018-11-07 ENCOUNTER — Other Ambulatory Visit: Payer: Self-pay | Admitting: Pharmacist

## 2018-11-07 ENCOUNTER — Ambulatory Visit: Payer: Self-pay | Admitting: Pharmacist

## 2018-11-07 NOTE — Patient Outreach (Signed)
Deanna Spencer  11/07/2018  Deanna Spencer May 16, 1940 [id number]   Patient was called to follow up on medication assistance and adherence with her pill packs. Unfortunately, she did not answer her phone. HIPAA compliant message was left on her voicemail.  Plan: Call patient back in 3-4 weeks.  Deanna Spencer, PharmD, [place] Clinical Pharmacist [phone]

## 2018-11-09 ENCOUNTER — Encounter: Payer: Self-pay | Admitting: Physical Therapy

## 2018-11-09 ENCOUNTER — Ambulatory Visit: Payer: Medicare Other | Admitting: Physical Therapy

## 2018-11-09 ENCOUNTER — Other Ambulatory Visit: Payer: Self-pay

## 2018-11-09 DIAGNOSIS — M62838 Other muscle spasm: Secondary | ICD-10-CM

## 2018-11-09 DIAGNOSIS — G8929 Other chronic pain: Secondary | ICD-10-CM

## 2018-11-09 DIAGNOSIS — M542 Cervicalgia: Secondary | ICD-10-CM | POA: Diagnosis not present

## 2018-11-09 DIAGNOSIS — M25512 Pain in left shoulder: Secondary | ICD-10-CM | POA: Diagnosis not present

## 2018-11-09 NOTE — Therapy (Signed)
[place], [place], [address] Phone: [phone]   Fax:  [phone]  Physical Therapy Treatment/Discharge  Patient Details  Name: Deanna Spencer MRN: [id number] Date of Birth: [birth year] Referring Provider (PT): Dr Ruthann Cancer Chambliss/ Nuala Alpha DO    Encounter Date: 11/09/2018  PT End of Session - 11/09/18 1110    Visit Number  14    Number of Visits  18    Date for PT Re-Evaluation  11/06/18    Authorization Type  [employer]; progress note at 10 visits    PT Start Time  1105    PT Stop Time  1137    PT Time Calculation (min)  32 min    Activity Tolerance  Patient tolerated treatment well    Behavior During Therapy  [facility] for tasks assessed/performed       Past Medical History:  Diagnosis Date  . AAA (abdominal aortic aneurysm) ([place])    3.5cm by CT 11/29/16  . AAA (abdominal aortic aneurysm) without rupture ([place]) 12/21/2016   Stable 3cm 4/20, needs f/u imaging ~4/23  . Acute abdominal pain 05/05/2018  . Anemia   . Atrial fibrillation ([place]) 11/22/2016  . Bee sting 06/28/2017  . Cerumen impaction 06/28/2017  . Childhood asthma    connected to allergies  . Chronic lower back pain   . Chronic systolic heart failure ([place]) 12/14/2016  . Diplopia 09/27/2017  . Diverticulosis   . Duodenal ulcer   . Dyspnea   . Dysrhythmia    a fib  . GERD (gastroesophageal reflux disease)   . GI bleed    "have had both upper and lower GIB"  . GI bleed 05/07/2018  . Gout   . Heart attack ([place]) 2017  . Heart murmur   . History of anastomosis and bypass hemorrhage 05/06/2018  . History of blood transfusion 2017-2018   "related to bleeding ulcers; 47 units PRBC; 2U plasma" (11/23/2016)  . History of duodenal ulcer 11/18/2016  . History of heart attack 11/18/2016  . History of hiatal hernia   . Hx of blood clots    in heart  . IBS (irritable bowel syndrome)   . Liver lesion 08/07/2017  . Neuropathy   .  Osteoarthritis   . Osteoarthritis of right knee 05/30/2017  . Osteoporosis   . Other chronic pain 11/18/2016  . Parasomnia 11/18/2016  . Perforated viscus 11/22/2016  . Peripheral neuropathy   . 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 Presence of Watchman left atrial appendage closure device   . Primary osteoarthritis of left hip 02/24/2017  . 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 Primary osteoarthritis of right knee 06/02/2017  . Raynaud disease   . Rheumatoid arteritis ([place])   . Rheumatoid arthritis involving both hands ([place]) 09/27/2017  . S/P exploratory laparotomy 11/22/2016  . Seasonal allergies 08/07/2017  . 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 SSS (sick sinus syndrome) ([place])    a. s/p Medtronic PPM placement in 2017  . Stomach ulcer   . Vision changes 08/07/2017    Past Surgical History:  Procedure Laterality Date  . ABDOMINAL HYSTERECTOMY    . APPENDECTOMY    . ARTHROPLASTY Right 06/02/2017   RIGHT PATELLA-FEMORAL ARTHROPLASTY, with placement of a cemented 29 mm Stryker asymmetric triathlon patellar button, revision of the tibial polyethylene bearing.  Marland Kitchen BIOPSY  05/06/2018   Procedure: BIOPSY;  Surgeon: Gatha Mayer, MD;  Location: [facility];  Service:  Endoscopy;;  . CARPAL TUNNEL RELEASE Bilateral   . CATARACT EXTRACTION W/ INTRAOCULAR LENS  IMPLANT, BILATERAL Bilateral   . CHOLECYSTECTOMY OPEN    . COLONOSCOPY WITH PROPOFOL N/A 10/30/2018   Procedure: COLONOSCOPY WITH PROPOFOL;  Surgeon: Doran Stabler, MD;  [facility]: [facility];  Service: Gastroenterology;  Laterality: N/A;  . DILATION AND CURETTAGE OF UTERUS    . ESOPHAGOGASTRODUODENOSCOPY (EGD) WITH PROPOFOL N/A 05/06/2018   Procedure: ESOPHAGOGASTRODUODENOSCOPY (EGD) WITH PROPOFOL;  Surgeon: Gatha Mayer, MD;  Location: [place];  Service: Endoscopy;  Laterality: N/A;  . ESOPHAGOGASTRODUODENOSCOPY (EGD) WITH PROPOFOL N/A 10/30/2018   Procedure:  ESOPHAGOGASTRODUODENOSCOPY (EGD) WITH PROPOFOL;  Surgeon: Doran Stabler, MD;  [facility];  Service: Gastroenterology;  Laterality: N/A;  . FOOT FUSION Right   . INGUINAL HERNIA REPAIR Right X 2  . INSERT / REPLACE / REMOVE PACEMAKER  2017   medtronic  . JOINT REPLACEMENT    . LAPAROTOMY N/A 11/21/2016   Procedure: EXPLORATORY LAPAROTOMY, REVISION OF  GASTROJEJUNOSTOMY, ESOPHAGOGASTROSCOPY;  Surgeon: Clovis Riley, MD;  Location: [place];  Service: General;  Laterality: N/A;  . LEFT ATRIAL APPENDAGE OCCLUSION  2018   placed  via groin  . LUMBAR FUSION  X 2  . PATELLA-FEMORAL ARTHROPLASTY Right 06/02/2017   Procedure: RIGHT PATELLA-FEMORAL ARTHROPLASTY;  Surgeon: Frederik Pear, MD;  Location: [place];  Service: Orthopedics;  Laterality: Right;  . REVERSE SHOULDER ARTHROPLASTY Left    reverse shoulder replacement  . ROUX-EN-Y GASTRIC BYPASS  2005  . ROUX-EN-Y PROCEDURE  20017 X 2   "revisions"  . TONSILLECTOMY AND ADENOIDECTOMY    . TOTAL HIP ARTHROPLASTY Right 02/24/2017   Procedure: TOTAL HIP ARTHROPLASTY;  Surgeon: Earlie Server, MD;  Location: [facility];  Service: Orthopedics;  Laterality: Right;  . TOTAL KNEE ARTHROPLASTY Bilateral   . TOTAL SHOULDER ARTHROPLASTY Bilateral   . TUBAL LIGATION      There were no vitals filed for this visit.  Subjective Assessment - 11/09/18 1107    Subjective  I am sore. I shredded 10lb of cabbage. I am doing a bunch of canning and have more to do. My muscles feel better but the shoulder still hurts. I am seeing an ortho soon.    Patient Stated Goals  to have less pain in her neck and back    Currently in Pain?  Yes    Pain Score  9     Pain Location  Neck    Pain Orientation  Left    Pain Descriptors / Indicators  Sore    Pain Radiating Towards  Lt shoulder    Aggravating Factors   cooking, lifting, reaching, moving shoulder    Pain Relieving Factors  rest         [facility] PT Assessment - 11/09/18 0001      Assessment   Medical  Diagnosis  Left Shoulder pain and Neck Pain     Referring Provider (PT)  Dr Ruthann Cancer Chambliss/ Nuala Alpha DO     Prior Therapy  For her shoulder follwing replacement       Precautions   Precautions  ICD/Pacemaker      Restrictions   Weight Bearing Restrictions  No      Balance Screen   Has the patient fallen in the past 6 months  No    Has the patient had a decrease in activity level because of a fear of falling?   No    Is the patient reluctant to leave  their home because of a fear of falling?   No      Home Film/video editor residence      Prior Function   Level of Independence  Independent    Vocation  Retired    Leisure  cooking      Cognition   Overall Cognitive Status  Within Functional Limits for tasks assessed      Posture/Postural Control   Posture Comments  rounded shoulders and forwrad head      AROM   Overall AROM Comments  <20% motion available in Lt arm without assist from Rt UE    Cervical - Right Rotation  44    Cervical - Left Rotation  42                           PT Education - 11/09/18 1146    Education Details  POC, goals, importance of continued HEP    Person(s) Educated  Patient    Methods  Explanation    Comprehension  Verbalized understanding       PT Short Term Goals - 10/09/18 1407      PT SHORT TERM GOAL #1   Title  Patient will report 2/10 pain at worst with available movement    Baseline  can reach a 9/10 but overall consistently less pain    Time  3    Period  Weeks    Status  On-going    Target Date  11/06/18      PT SHORT TERM GOAL #2   Title  Patient will increase bilateral cervical rotation by 10 degrees    Baseline  progressing towards goal    Time  3    Period  Weeks    Status  On-going    Target Date  11/06/18      PT SHORT TERM GOAL #3   Title  Patient will restate the importance of proper posture while using her phone.    Baseline  More aware of posture    Period   Weeks    Status  On-going    Target Date  11/06/18        PT Long Term Goals - 11/09/18 1116      PT LONG TERM GOAL #1   Title  Patient will report no pain with the limited function of her left shoulder    Baseline  muscles feel better but the shoulder is still horrible    Status  Not Met      PT LONG TERM GOAL #2   Title  Patient will increase bilateral cervical rotation to 86 degrees      PT LONG TERM GOAL #3   Title  Patient will sleep thorugh the night without increased neck and shoulder pain    Baseline  between the neck and feet, I cannot sleep through the night    Status  Not Met            Plan - 11/09/18 1147    Clinical Impression Statement  Pt did not meet her long term goals set at eval but has reached a plateau at this time. Improved knowledge and performance of postural alignment and is aware of limitations and need for rest breaks. We discussed that she will continue to perform HEP and return to PT as needed. Seeing Ortho MD and will discuss other options. Encouraged her to contact us with any further  questions.    PT Treatment/Interventions  ADLs/Self Care Home Management;Cryotherapy;Electrical Stimulation;Iontophoresis 47m/ml Dexamethasone;Moist Heat;Ultrasound;DME Instruction;Gait training;Stair training;Functional mobility training;Therapeutic activities;Therapeutic exercise;Neuromuscular re-education;Patient/family education;Manual techniques;Passive range of motion;Taping    PT Home Exercise Plan  cerivcal rotation; scpa retraction, left upper trap stretch, seated short arc flx & abd, supine chest press, row anchored, SL ER, biceps curls    Consulted and Agree with Plan of Care  Patient       Patient will benefit from skilled therapeutic intervention in order to improve the following deficits and impairments:  Pain, Decreased mobility, Decreased strength, Decreased activity tolerance, Decreased endurance, Impaired UE functional use, Increased fascial  restricitons, Impaired tone  Visit Diagnosis: Cervicalgia  Other muscle spasm  Chronic left shoulder pain     Problem List Patient Active Problem List   Diagnosis Date Noted  . Abnormal loss of weight   . Anastomotic ulcer S/P gastric bypass   . Depression, major, single episode, mild ([place] 09/20/2018  . Hearing loss 09/20/2018  . History of double vision 09/20/2018  . Fatigue 08/01/2018  . Left shoulder pain 06/30/2018  . Chronic right shoulder pain 06/14/2018  . Anemia due to chronic blood loss 05/07/2018  . Odynophagia 05/07/2018  . Protein-calorie malnutrition ([facility] 05/07/2018  . Absolute anemia   . Cardiac pacemaker in situ 05/06/2018  . History of anastomosis and bypass hemorrhage 05/06/2018  . Atrial fibrillation with rapid ventricular response ([place]   . Dehydration   . Bilious vomiting with nausea   . Gastrojejunal ulcer   . Acute abdominal pain 05/05/2018  . Rheumatoid arthritis involving both hands ([place] 09/27/2017  . Seasonal allergies 08/07/2017  . Primary osteoarthritis of right knee 06/02/2017  . Osteoarthritis of right knee 05/30/2017  . Primary osteoarthritis of left hip 02/24/2017  . AAA (abdominal aortic aneurysm) without rupture ([place] 12/21/2016  . Chronic systolic heart failure ([place] 12/14/2016  . Atrial fibrillation ([place] 11/22/2016  . Peripheral neuropathy 11/22/2016  . GERD (gastroesophageal reflux disease) 11/22/2016  . Abdominal pain 11/18/2016  . 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 Parasomnia 11/18/2016    PHYSICAL THERAPY DISCHARGE SUMMARY  Visits from Start of Care: 14  Current functional level related to goals / functional outcomes: See above   Remaining deficits: See above   Education / Equipment: Anatomy of condition, POC, HEP, exercise form/rationale  Plan: Patient agrees to discharge.  Patient goals were not met. Patient is being discharged due to lack of progress.  ?????      Bev Drennen C. Zurisadai Helminiak PT, DPT 11/09/18 11:51 AM   [facility]GIs[place] [address]Phone: [phone]  Fax:  [phone] Name: Deanna YEPEZMRN: [id number] of Birth: [birth year]

## 2018-11-12 MED ORDER — GABAPENTIN 300 MG PO CAPS: ORAL_CAPSULE | ORAL | 5 refills | Status: DC

## 2018-11-15 ENCOUNTER — Other Ambulatory Visit: Payer: Self-pay

## 2018-11-16 MED ORDER — PANTOPRAZOLE SODIUM 40 MG PO TBEC: 40.0000 mg | DELAYED_RELEASE_TABLET | Freq: Two times a day (BID) | ORAL | 5 refills | Status: DC

## 2018-11-16 MED ORDER — ROPINIROLE HCL 0.5 MG PO TABS: 0.5000 mg | ORAL_TABLET | Freq: Three times a day (TID) | ORAL | 2 refills | Status: DC

## 2018-11-19 ENCOUNTER — Other Ambulatory Visit: Payer: Self-pay | Admitting: Pharmacy Technician

## 2018-11-19 NOTE — Patient Outreach (Signed)
[facility]) Care Management  11/19/2018  Mattye A [place] [birth year] [id number]    Incoming call received from patient who was returning my call in regards to [employer] application for [employer]  Spoke to patient, HIPAA identifiers verified.  Patient informed her daughter dropped off the supporting documentation to her  over the weekend. Provided patient with the address to mail the information back to me. Once received then I can put with the provider's part when it is received and fax into [employer].  Patient did mention some health concerns which I informed her to reach to her providers' office today. Patient informed she has an appointment tomorrow and would mention it at that time. She inquired if I could also send him a message. Those concerns are swelling in her feet and wrists. Left side seems to be worse than right side. The left side started Saturday and the right side on Sunday. She also mentioned she was having headaches. She also said her head hurt at the back down to the base of her neck. She also informed when she lies on her left side she has ear pain. The pain in the back of the head to the neck has been going on for the past week and the ear pain she says was mentioned at her last office visit. Lastly she was wondering if Dr. Opal Sidles could recommend an affordable dental provider.  This information was sent to Dr. Garlan Fillers in an inbasket message and [facility] RPh Denyse Amass was also copied as an FYI on the message.  Will await return patient assistance documents.  [place] Sherr P. Clela Hagadorn, [place] Management [phone]

## 2018-11-19 NOTE — Patient Outreach (Signed)
[facility]) Care Management  11/19/2018  Deanna Spencer 01-04-1941 [id number]    Unsuccessful call placed to patient regarding patient assistance application(s) for [place] with [employer]. , HIPAA compliant voicemail left. Was calling patient to inquire if she had sent in her application. In our last phone call, patient informed she would have to rely on her daughter to gather the supporting documentation to send in with the patient assistance application.  We are also awaiting the provider's portion of the application that has been sent to Dr. Arlana Pouch office several times. According to medication list, patient is currently not on Toviaz but is on Tolteridone ER 4mg . Previously established that patient informed she prefers [name] as it works better and the copay is comparable to the [employer] ER 4mg  which is cost prohibitive.  Since no timeline could be established and since communication with patient has been sporadic will close case at this time with option to re open the case if the application is sent in.  Follow up:  Will route note to [facility] for case closure  Latonda Larrivee P. Giovanni Biby, [place] Management [phone]

## 2018-11-20 ENCOUNTER — Ambulatory Visit: Payer: Medicare Other | Admitting: Physical Therapy

## 2018-11-22 ENCOUNTER — Ambulatory Visit: Payer: Medicare Other

## 2018-11-22 ENCOUNTER — Ambulatory Visit: Payer: Medicare Other | Admitting: Physical Therapy

## 2018-11-27 ENCOUNTER — Ambulatory Visit: Payer: Medicare Other | Admitting: Family Medicine

## 2018-12-05 ENCOUNTER — Telehealth: Payer: Self-pay | Admitting: *Deleted

## 2018-12-05 ENCOUNTER — Other Ambulatory Visit: Payer: Self-pay | Admitting: Pharmacist

## 2018-12-05 ENCOUNTER — Ambulatory Visit: Payer: Self-pay | Admitting: Pharmacist

## 2018-12-05 NOTE — Telephone Encounter (Signed)
Rx request for Digoxin 125 mcg tablet. Wants 90 day supply. Not seen on med list, please advise. Deseree Kennon Holter, CMA

## 2018-12-05 NOTE — Patient Outreach (Signed)
[facility]) Care Management  12/05/2018  Deanna Spencer 07/24/1940 [id number]   Patient was called regarding medication adherence and her pill packs. Unfortunately, she did not answer the phone. HIPAA compliant message was left on her voicemail.   Plan: Send patient an unsuccessful outreach letter. Follow up in 3-4 weeks.  Elayne Guerin, PharmD, [place] Clinical Pharmacist [phone]

## 2018-12-09 ENCOUNTER — Telehealth: Payer: Self-pay | Admitting: Family Medicine

## 2018-12-09 NOTE — Telephone Encounter (Signed)
Family medicine after-hours emergency line note  Patient calling stating that she has had headache, chills, severe cough, mild shortness of breath, and congestion over the last few days.  She "knows" that she does not have coronavirus as she has been tested several times.  Looks like her last test was in mid October.  Patient states that she thinks that she has a sinus infection.  Described that I cannot give antibiotics to the emergency line.  Audible wheezing and severe cough heard over the phone, with patient seemingly gasping for air.  Recommended going to the emergency department.  Patient states that she does not feel like that is necessary at this time.  Explained that if patient is not going to go to the emergency department that I do recommend picking up over-the-counter medication such as Tylenol, Robitussin-DM cold and flu or NyQuil cold and flu.  I have further explained that if she was to get any worse I still recommend going to emergency department.  Discussed setting patient up with clinic visit, unfortunately all slots are full on 11/30.  She would obviously be a virtual visit given her symptoms.  Recommended calling in the morning to see if there was any openings or possibly being seen by urgent care.  Patient voiced understanding and is in agreement.  Stated that she does not feel that she needs to go to the emergency department at this time.  Guadalupe Dawn MD PGY-3 Family Medicine Resident

## 2018-12-10 ENCOUNTER — Emergency Department (HOSPITAL_COMMUNITY): Payer: Medicare Other

## 2018-12-10 ENCOUNTER — Telehealth: Payer: Self-pay

## 2018-12-10 ENCOUNTER — Inpatient Hospital Stay (HOSPITAL_COMMUNITY)
Admission: EM | Admit: 2018-12-10 | Discharge: 2018-12-11 | DRG: 194 | Disposition: A | Discharge status: Home / Self Care | Payer: Medicare Other | Attending: Family Medicine | Admitting: Family Medicine

## 2018-12-10 ENCOUNTER — Other Ambulatory Visit: Payer: Self-pay

## 2018-12-10 ENCOUNTER — Encounter (HOSPITAL_COMMUNITY): Payer: Self-pay | Admitting: Emergency Medicine

## 2018-12-10 DIAGNOSIS — Z96653 Presence of artificial knee joint, bilateral: Secondary | ICD-10-CM | POA: Diagnosis present

## 2018-12-10 DIAGNOSIS — M8088XA Other osteoporosis with current pathological fracture, vertebra(e), initial encounter for fracture: Secondary | ICD-10-CM | POA: Diagnosis present

## 2018-12-10 DIAGNOSIS — K219 Gastro-esophageal reflux disease without esophagitis: Secondary | ICD-10-CM | POA: Diagnosis present

## 2018-12-10 DIAGNOSIS — D491 Neoplasm of unspecified behavior of respiratory system: Secondary | ICD-10-CM | POA: Diagnosis present

## 2018-12-10 DIAGNOSIS — Z79899 Other long term (current) drug therapy: Secondary | ICD-10-CM | POA: Diagnosis not present

## 2018-12-10 DIAGNOSIS — Z66 Do not resuscitate: Secondary | ICD-10-CM | POA: Diagnosis present

## 2018-12-10 DIAGNOSIS — I7300 Raynaud's syndrome without gangrene: Secondary | ICD-10-CM | POA: Diagnosis present

## 2018-12-10 DIAGNOSIS — Z981 Arthrodesis status: Secondary | ICD-10-CM

## 2018-12-10 DIAGNOSIS — Z8042 Family history of malignant neoplasm of prostate: Secondary | ICD-10-CM

## 2018-12-10 DIAGNOSIS — Z8711 Personal history of peptic ulcer disease: Secondary | ICD-10-CM

## 2018-12-10 DIAGNOSIS — Z20828 Contact with and (suspected) exposure to other viral communicable diseases: Secondary | ICD-10-CM | POA: Diagnosis present

## 2018-12-10 DIAGNOSIS — N179 Acute kidney failure, unspecified: Secondary | ICD-10-CM | POA: Diagnosis present

## 2018-12-10 DIAGNOSIS — I714 Abdominal aortic aneurysm, without rupture: Secondary | ICD-10-CM | POA: Diagnosis present

## 2018-12-10 DIAGNOSIS — E876 Hypokalemia: Secondary | ICD-10-CM | POA: Diagnosis present

## 2018-12-10 DIAGNOSIS — I495 Sick sinus syndrome: Secondary | ICD-10-CM | POA: Diagnosis present

## 2018-12-10 DIAGNOSIS — I252 Old myocardial infarction: Secondary | ICD-10-CM

## 2018-12-10 DIAGNOSIS — Z950 Presence of cardiac pacemaker: Secondary | ICD-10-CM | POA: Diagnosis not present

## 2018-12-10 DIAGNOSIS — Z885 Allergy status to narcotic agent status: Secondary | ICD-10-CM | POA: Diagnosis not present

## 2018-12-10 DIAGNOSIS — I4510 Unspecified right bundle-branch block: Secondary | ICD-10-CM | POA: Diagnosis present

## 2018-12-10 DIAGNOSIS — I4891 Unspecified atrial fibrillation: Secondary | ICD-10-CM | POA: Diagnosis present

## 2018-12-10 DIAGNOSIS — Z86718 Personal history of other venous thrombosis and embolism: Secondary | ICD-10-CM

## 2018-12-10 DIAGNOSIS — F419 Anxiety disorder, unspecified: Secondary | ICD-10-CM | POA: Diagnosis present

## 2018-12-10 DIAGNOSIS — J432 Centrilobular emphysema: Secondary | ICD-10-CM | POA: Diagnosis present

## 2018-12-10 DIAGNOSIS — Z9181 History of falling: Secondary | ICD-10-CM | POA: Diagnosis not present

## 2018-12-10 DIAGNOSIS — H9190 Unspecified hearing loss, unspecified ear: Secondary | ICD-10-CM | POA: Diagnosis present

## 2018-12-10 DIAGNOSIS — J189 Pneumonia, unspecified organism: Principal | ICD-10-CM | POA: Diagnosis present

## 2018-12-10 DIAGNOSIS — I5022 Chronic systolic (congestive) heart failure: Secondary | ICD-10-CM | POA: Diagnosis present

## 2018-12-10 DIAGNOSIS — G629 Polyneuropathy, unspecified: Secondary | ICD-10-CM | POA: Diagnosis present

## 2018-12-10 DIAGNOSIS — Z90710 Acquired absence of both cervix and uterus: Secondary | ICD-10-CM

## 2018-12-10 DIAGNOSIS — I700 Atherosclerosis of aorta: Secondary | ICD-10-CM | POA: Diagnosis present

## 2018-12-10 DIAGNOSIS — S22000A Wedge compression fracture of unspecified thoracic vertebra, initial encounter for closed fracture: Secondary | ICD-10-CM | POA: Diagnosis not present

## 2018-12-10 DIAGNOSIS — I76 Septic arterial embolism: Secondary | ICD-10-CM | POA: Diagnosis present

## 2018-12-10 DIAGNOSIS — I5020 Unspecified systolic (congestive) heart failure: Secondary | ICD-10-CM

## 2018-12-10 DIAGNOSIS — E860 Dehydration: Secondary | ICD-10-CM | POA: Diagnosis present

## 2018-12-10 DIAGNOSIS — G2581 Restless legs syndrome: Secondary | ICD-10-CM | POA: Diagnosis present

## 2018-12-10 DIAGNOSIS — Z8249 Family history of ischemic heart disease and other diseases of the circulatory system: Secondary | ICD-10-CM

## 2018-12-10 DIAGNOSIS — I248 Other forms of acute ischemic heart disease: Secondary | ICD-10-CM | POA: Diagnosis present

## 2018-12-10 DIAGNOSIS — Z888 Allergy status to other drugs, medicaments and biological substances status: Secondary | ICD-10-CM

## 2018-12-10 DIAGNOSIS — M069 Rheumatoid arthritis, unspecified: Secondary | ICD-10-CM | POA: Diagnosis present

## 2018-12-10 DIAGNOSIS — I2510 Atherosclerotic heart disease of native coronary artery without angina pectoris: Secondary | ICD-10-CM | POA: Diagnosis present

## 2018-12-10 DIAGNOSIS — Z79891 Long term (current) use of opiate analgesic: Secondary | ICD-10-CM

## 2018-12-10 DIAGNOSIS — Z9884 Bariatric surgery status: Secondary | ICD-10-CM

## 2018-12-10 DIAGNOSIS — R0602 Shortness of breath: Secondary | ICD-10-CM | POA: Diagnosis not present

## 2018-12-10 DIAGNOSIS — Z96641 Presence of right artificial hip joint: Secondary | ICD-10-CM | POA: Diagnosis present

## 2018-12-10 LAB — CBC WITH DIFFERENTIAL/PLATELET
Abs Immature Granulocytes: 0.07 10*3/uL (ref 0.00–0.07)
Basophils Absolute: 0 10*3/uL (ref 0.0–0.1)
Basophils Relative: 0 %
Eosinophils Absolute: 0.1 10*3/uL (ref 0.0–0.5)
Eosinophils Relative: 1 %
HCT: 36.2 % (ref 36.0–46.0)
Hemoglobin: 11.3 g/dL — ABNORMAL LOW (ref 12.0–15.0)
Immature Granulocytes: 1 %
Lymphocytes Relative: 10 %
Lymphs Abs: 1.1 10*3/uL (ref 0.7–4.0)
MCH: 29.7 pg (ref 26.0–34.0)
MCHC: 31.2 g/dL (ref 30.0–36.0)
MCV: 95.3 fL (ref 80.0–100.0)
Monocytes Absolute: 0.9 10*3/uL (ref 0.1–1.0)
Monocytes Relative: 8 %
Neutro Abs: 9.6 10*3/uL — ABNORMAL HIGH (ref 1.7–7.7)
Neutrophils Relative %: 80 %
Platelets: 291 10*3/uL (ref 150–400)
RBC: 3.8 MIL/uL — ABNORMAL LOW (ref 3.87–5.11)
RDW: 15 % (ref 11.5–15.5)
WBC: 11.8 10*3/uL — ABNORMAL HIGH (ref 4.0–10.5)
nRBC: 0 % (ref 0.0–0.2)

## 2018-12-10 LAB — COMPREHENSIVE METABOLIC PANEL
ALT: 8 U/L (ref 0–44)
AST: 14 U/L — ABNORMAL LOW (ref 15–41)
Albumin: 2.8 g/dL — ABNORMAL LOW (ref 3.5–5.0)
Alkaline Phosphatase: 54 U/L (ref 38–126)
Anion gap: 9 (ref 5–15)
BUN: 20 mg/dL (ref 8–23)
CO2: 27 mmol/L (ref 22–32)
Calcium: 8.7 mg/dL — ABNORMAL LOW (ref 8.9–10.3)
Chloride: 103 mmol/L (ref 98–111)
Creatinine, Ser: 1.14 mg/dL — ABNORMAL HIGH (ref 0.44–1.00)
GFR calc Af Amer: 54 mL/min — ABNORMAL LOW (ref >60)
GFR calc non Af Amer: 47 mL/min — ABNORMAL LOW (ref >60)
Glucose, Bld: 111 mg/dL — ABNORMAL HIGH (ref 70–99)
Potassium: 3.2 mmol/L — ABNORMAL LOW (ref 3.5–5.1)
Sodium: 139 mmol/L (ref 135–145)
Total Bilirubin: 0.9 mg/dL (ref 0.3–1.2)
Total Protein: 6.2 g/dL — ABNORMAL LOW (ref 6.5–8.1)

## 2018-12-10 LAB — RESPIRATORY PANEL BY PCR

## 2018-12-10 LAB — TROPONIN I (HIGH SENSITIVITY): Troponin I (High Sensitivity): 26 ng/L — ABNORMAL HIGH (ref <18)

## 2018-12-10 LAB — POC SARS CORONAVIRUS 2 AG -  ED: SARS Coronavirus 2 Ag: NEGATIVE

## 2018-12-10 LAB — BRAIN NATRIURETIC PEPTIDE: B Natriuretic Peptide: 1673.1 pg/mL — ABNORMAL HIGH (ref 0.0–100.0)

## 2018-12-10 MED ORDER — METOPROLOL TARTRATE 100 MG PO TABS
100.0000 mg | ORAL_TABLET | Freq: Two times a day (BID) | ORAL | Status: DC
  Administered 2018-12-10 – 2018-12-11 (×2): 100 mg via ORAL
  Filled 2018-12-10: qty 1
  Filled 2018-12-10: qty 4

## 2018-12-10 MED ORDER — ACETAMINOPHEN 325 MG PO TABS
650.0000 mg | ORAL_TABLET | Freq: Four times a day (QID) | ORAL | Status: DC | PRN
  Administered 2018-12-10 – 2018-12-11 (×3): 650 mg via ORAL
  Filled 2018-12-10 (×3): qty 2

## 2018-12-10 MED ORDER — IOHEXOL 350 MG/ML SOLN
80.0000 mL | Freq: Once | INTRAVENOUS | Status: AC | PRN
  Administered 2018-12-10: 80 mL via INTRAVENOUS

## 2018-12-10 MED ORDER — FAMOTIDINE 20 MG PO TABS
40.0000 mg | ORAL_TABLET | Freq: Every day | ORAL | Status: DC
  Administered 2018-12-10: 40 mg via ORAL
  Filled 2018-12-10: qty 2

## 2018-12-10 MED ORDER — HEPARIN SODIUM (PORCINE) 5000 UNIT/ML IJ SOLN
5000.0000 [IU] | Freq: Three times a day (TID) | INTRAMUSCULAR | Status: DC
  Administered 2018-12-10 – 2018-12-11 (×4): 5000 [IU] via SUBCUTANEOUS
  Filled 2018-12-10 (×4): qty 1

## 2018-12-10 MED ORDER — SODIUM CHLORIDE 0.9 % IV SOLN
INTRAVENOUS | Status: DC
  Administered 2018-12-10: 17:00:00 via INTRAVENOUS

## 2018-12-10 MED ORDER — GABAPENTIN 300 MG PO CAPS
600.0000 mg | ORAL_CAPSULE | Freq: Three times a day (TID) | ORAL | Status: DC
  Administered 2018-12-10 – 2018-12-11 (×4): 600 mg via ORAL
  Filled 2018-12-10 (×4): qty 2

## 2018-12-10 MED ORDER — SODIUM CHLORIDE 0.9 % IV SOLN
1.0000 g | Freq: Once | INTRAVENOUS | Status: AC
  Administered 2018-12-10: 1 g via INTRAVENOUS
  Filled 2018-12-10: qty 10

## 2018-12-10 MED ORDER — SODIUM CHLORIDE 0.9 % IV SOLN
500.0000 mg | Freq: Once | INTRAVENOUS | Status: AC
  Administered 2018-12-10: 500 mg via INTRAVENOUS
  Filled 2018-12-10: qty 500

## 2018-12-10 MED ORDER — SERTRALINE HCL 50 MG PO TABS
50.0000 mg | ORAL_TABLET | Freq: Every day | ORAL | Status: DC
  Administered 2018-12-11: 50 mg via ORAL
  Filled 2018-12-10 (×2): qty 1

## 2018-12-10 NOTE — ED Notes (Signed)
Ordered diet tray 

## 2018-12-10 NOTE — ED Provider Notes (Signed)
[facility] EMERGENCY DEPARTMENT Provider Note   CSN: [id number] Arrival date & time: 12/10/18  [id number]     History   Chief Complaint Chief Complaint  Patient presents with  . Shortness of Breath    HPI Deanna Spencer is a 78 y.o. female with a past medical history of gastric ulcers, atrial fibrillation, AAA, prior blood clots, CHF, pacemaker in place presenting to the ED with a chief complaint of shortness of breath.  For the past week reports worsening shortness of breath as well as a "hacking" cough.  She has tried Benadryl with no improvement in her symptoms.  No sick contacts with similar symptoms.  Denies any chest pain but does report some vomiting.  Denies history of similar symptoms in the past.  Has been compliant with her home medications.  Has noticed intermittent swelling to both of her legs.  Denies any injuries or falls, hemoptysis, anticoagulant use, fever, known COVID-19 exposures, urinary symptoms, sinus pressure.     HPI  Past Medical History:  Diagnosis Date  . AAA (abdominal aortic aneurysm) ([place])    3.5cm by CT 11/29/16  . AAA (abdominal aortic aneurysm) without rupture ([place]) 12/21/2016   Stable 3cm 4/20, needs f/u imaging ~4/23  . Acute abdominal pain 05/05/2018  . Anemia   . Atrial fibrillation ([place]) 11/22/2016  . Bee sting 06/28/2017  . Cerumen impaction 06/28/2017  . Childhood asthma    connected to allergies  . Chronic lower back pain   . Chronic systolic heart failure ([place]) 12/14/2016  . Diplopia 09/27/2017  . Diverticulosis   . Duodenal ulcer   . Dyspnea   . Dysrhythmia    a fib  . GERD (gastroesophageal reflux disease)   . GI bleed    "have had both upper and lower GIB"  . GI bleed 05/07/2018  . Gout   . Heart attack ([place]) 2017  . Heart murmur   . History of anastomosis and bypass hemorrhage 05/06/2018  . History of blood transfusion 2017-2018   "related to bleeding ulcers; 47 units PRBC; 2U plasma" (11/23/2016)  . History of  duodenal ulcer 11/18/2016  . History of heart attack 11/18/2016  . History of hiatal hernia   . Hx of blood clots    in heart  . IBS (irritable bowel syndrome)   . Liver lesion 08/07/2017  . Neuropathy   . Osteoarthritis   . Osteoarthritis of right knee 05/30/2017  . Osteoporosis   . Other chronic pain 11/18/2016  . Parasomnia 11/18/2016  . Perforated viscus 11/22/2016  . Peripheral neuropathy   . Presence of permanent cardiac pacemaker 10/2015   medtronic; Serial # [id number]  . Presence of Watchman left atrial appendage closure device   . Primary osteoarthritis of left hip 02/24/2017  . 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 Primary osteoarthritis of right knee 06/02/2017  . Raynaud disease   . Rheumatoid arteritis ([place])   . Rheumatoid arthritis involving both hands ([place]) 09/27/2017  . S/P exploratory laparotomy 11/22/2016  . Seasonal allergies 08/07/2017  . 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 SSS (sick sinus syndrome) ([place])    a. s/p Medtronic PPM placement in 2017  . Stomach ulcer   . Vision changes 08/07/2017    Patient Active Problem List   Diagnosis Date Noted  . Abnormal loss of weight   . Anastomotic ulcer S/P gastric bypass   . Depression, major, single episode, mild ([place]) 09/20/2018  . Hearing loss 09/20/2018  . History of  double vision 09/20/2018  . Fatigue 08/01/2018  . Left shoulder pain 06/30/2018  . Chronic right shoulder pain 06/14/2018  . Anemia due to chronic blood loss 05/07/2018  . Odynophagia 05/07/2018  . Protein-calorie malnutrition ([place]) 05/07/2018  . Absolute anemia   . Cardiac pacemaker in situ 05/06/2018  . History of anastomosis and bypass hemorrhage 05/06/2018  . Atrial fibrillation with rapid ventricular response ([place])   . Dehydration   . Bilious vomiting with nausea   . Gastrojejunal ulcer   . Acute abdominal pain 05/05/2018  . Rheumatoid arthritis involving both hands ([place]) 09/27/2017  . Seasonal allergies 08/07/2017  . Primary  osteoarthritis of right knee 06/02/2017  . Osteoarthritis of right knee 05/30/2017  . Primary osteoarthritis of left hip 02/24/2017  . AAA (abdominal aortic aneurysm) without rupture ([place]) 12/21/2016  . Chronic systolic heart failure ([place]) 12/14/2016  . Atrial fibrillation ([place]) 11/22/2016  . Peripheral neuropathy 11/22/2016  . GERD (gastroesophageal reflux disease) 11/22/2016  . Abdominal pain 11/18/2016  . 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 Parasomnia 11/18/2016    Past Surgical History:  Procedure Laterality Date  . ABDOMINAL HYSTERECTOMY    . APPENDECTOMY    . ARTHROPLASTY Right 06/02/2017   RIGHT PATELLA-FEMORAL ARTHROPLASTY, with placement of a cemented 29 mm Stryker asymmetric triathlon patellar button, revision of the tibial polyethylene bearing.  Marland Kitchen BIOPSY  05/06/2018   Procedure: BIOPSY;  Surgeon: Gatha Mayer, MD;  Location: [facility];  Service: Endoscopy;;  . CARPAL TUNNEL RELEASE Bilateral   . CATARACT EXTRACTION W/ INTRAOCULAR LENS  IMPLANT, BILATERAL Bilateral   . CHOLECYSTECTOMY OPEN    . COLONOSCOPY WITH PROPOFOL N/A 10/30/2018   Procedure: COLONOSCOPY WITH PROPOFOL;  Surgeon: Doran Stabler, MD;  Location: [facility];  Service: Gastroenterology;  Laterality: N/A;  . DILATION AND CURETTAGE OF UTERUS    . ESOPHAGOGASTRODUODENOSCOPY (EGD) WITH PROPOFOL N/A 05/06/2018   Procedure: ESOPHAGOGASTRODUODENOSCOPY (EGD) WITH PROPOFOL;  Surgeon: Gatha Mayer, MD;  Location: [facility];  Service: Endoscopy;  Laterality: N/A;  . ESOPHAGOGASTRODUODENOSCOPY (EGD) WITH PROPOFOL N/A 10/30/2018   Procedure: ESOPHAGOGASTRODUODENOSCOPY (EGD) WITH PROPOFOL;  Surgeon: Doran Stabler, MD;  Location: [facility];  Service: Gastroenterology;  Laterality: N/A;  . FOOT FUSION Right   . INGUINAL HERNIA REPAIR Right X 2  . INSERT / REPLACE / REMOVE PACEMAKER  2017   medtronic  . JOINT REPLACEMENT    . LAPAROTOMY N/A 11/21/2016    Procedure: EXPLORATORY LAPAROTOMY, REVISION OF  GASTROJEJUNOSTOMY, ESOPHAGOGASTROSCOPY;  Surgeon: Clovis Riley, MD;  Location: [facility];  Service: General;  Laterality: N/A;  . LEFT ATRIAL APPENDAGE OCCLUSION  2018   placed  via groin  . LUMBAR FUSION  X 2  . PATELLA-FEMORAL ARTHROPLASTY Right 06/02/2017   Procedure: RIGHT PATELLA-FEMORAL ARTHROPLASTY;  Surgeon: Frederik Pear, MD;  Location: [facility];  Service: Orthopedics;  Laterality: Right;  . REVERSE SHOULDER ARTHROPLASTY Left    reverse shoulder replacement  . ROUX-EN-Y GASTRIC BYPASS  2005  . ROUX-EN-Y PROCEDURE  20017 X 2   "revisions"  . TONSILLECTOMY AND ADENOIDECTOMY    . TOTAL HIP ARTHROPLASTY Right 02/24/2017   Procedure: TOTAL HIP ARTHROPLASTY;  Surgeon: Earlie Server, MD;  Location: [facility];  Service: Orthopedics;  Laterality: Right;  . TOTAL KNEE ARTHROPLASTY Bilateral   . TOTAL SHOULDER ARTHROPLASTY Bilateral   . TUBAL LIGATION       OB History   No obstetric history on file.  Home Medications    Prior to Admission medications   Medication Sig Start Date End Date Taking? Authorizing Provider  acetaminophen (TYLENOL) 500 MG tablet Take 1,000 mg every 6 (six) hours as needed by mouth (pain).     [provider]  calcium citrate (CALCITRATE) 950 MG tablet Take 200 mg of elemental calcium by mouth 2 (two) times daily.    [provider]  COLCRYS 0.6 MG tablet TAKE 1 TABLET (0.6 MG TOTAL) BY MOUTH 2 (TWO) TIMES DAILY AS NEEDED (GOUT ATTACKS). Patient taking differently: Take 0.6 mg by mouth 2 (two) times daily as needed (gout).  07/11/18   Nuala Alpha, DO  famotidine (PEPCID) 40 MG tablet Take 1 tablet (40 mg total) by mouth at bedtime. 08/29/18   Nuala Alpha, DO  gabapentin (NEURONTIN) 300 MG capsule Please take 2 tablets three times daily starting in the morning 11/12/18   Lockamy, Timothy, DO  lactose free nutrition (BOOST PLUS) LIQD Take 237 mLs by mouth 3 (three) times daily with meals.  05/14/18   Patriciaann Clan, DO  metoprolol tartrate (LOPRESSOR) 100 MG tablet Take 100 mg by mouth 2 (two) times daily.    [provider]  Multiple Vitamins-Minerals (MULTIVITAMIN WITH MINERALS) tablet Take 1 tablet by mouth daily.    [provider]  ondansetron (ZOFRAN ODT) 4 MG disintegrating tablet Take 1 tablet (4 mg total) by mouth every 8 (eight) hours as needed for nausea or vomiting. 07/15/18   Gareth [place], MD  pantoprazole (PROTONIX) 40 MG tablet Take 1 tablet (40 mg total) by mouth 2 (two) times daily. 11/16/18   Nuala Alpha, DO  polyethylene glycol (MIRALAX / GLYCOLAX) 17 g packet Take 17 g by mouth daily. Patient taking differently: Take 17 g by mouth daily as needed for moderate constipation.  05/15/18   Patriciaann Clan, DO  rOPINIRole (REQUIP) 0.5 MG tablet Take 1 tablet (0.5 mg total) by mouth 3 (three) times daily. 11/16/18   Nuala Alpha, DO  sertraline (ZOLOFT) 50 MG tablet Take 1 tablet (50 mg total) by mouth daily. 08/22/18   Nuala Alpha, DO  sucralfate (CARAFATE) 1 g tablet Take 1 tablet (1 g total) by mouth every 6 (six) hours. 08/29/18   Nuala Alpha, DO  tolterodine (DETROL LA) 4 MG 24 hr capsule Take 1 capsule (4 mg total) by mouth daily. 08/01/18   Nuala Alpha, DO  traMADol (ULTRAM) 50 MG tablet Take 1 tablet (50 mg total) by mouth every 6 (six) hours as needed for moderate pain. 10/31/18   Nuala Alpha, DO    Family History Family History  Problem Relation Age of Onset  . Heart disease Mother   . Liver disease Father   . Ulcers Father   . Prostate cancer Brother   . Colon cancer Neg Hx     Social History Social History   Tobacco Use  . Smoking status: Never Smoker  . Smokeless tobacco: Never Used  Substance Use Topics  . Alcohol use: No    Frequency: Never    Comment: very rare  . Drug use: No     Allergies   Demerol [meperidine] and Nsaids   Review of Systems Review of Systems  Constitutional:  Negative for appetite change, chills and fever.  HENT: Negative for ear pain, rhinorrhea, sneezing and sore throat.   Eyes: Negative for photophobia and visual disturbance.  Respiratory: Positive for cough and shortness of breath. Negative for chest tightness and wheezing.   Cardiovascular: Negative for  chest pain and palpitations.  Gastrointestinal: Negative for abdominal pain, blood in stool, constipation, diarrhea, nausea and vomiting.  Genitourinary: Negative for dysuria, hematuria and urgency.  Musculoskeletal: Negative for myalgias.  Skin: Negative for rash.  Neurological: Negative for dizziness, weakness and light-headedness.     Physical Exam Updated Vital Signs BP (!) 139/96   Pulse 71   Temp 98.5 F (36.9 C) (Oral)   Resp (!) 22   Ht 4\' 11"  (1.499 m)   Wt 51.3 kg   SpO2 95%   BMI 22.82 kg/m   Physical Exam Vitals signs and nursing note reviewed.  Constitutional:      General: She is not in acute distress.    Appearance: She is well-developed.  HENT:     Head: Normocephalic and atraumatic.     Nose: Nose normal.  Eyes:     General: No scleral icterus.       Left eye: No discharge.     Conjunctiva/sclera: Conjunctivae normal.  Neck:     Musculoskeletal: Normal range of motion and neck supple.  Cardiovascular:     Rate and Rhythm: Normal rate and regular rhythm.     Heart sounds: Normal heart sounds. No murmur. No friction rub. No gallop.   Pulmonary:     Effort: Pulmonary effort is normal. No respiratory distress.     Breath sounds: Normal breath sounds.  Abdominal:     General: Bowel sounds are normal. There is no distension.     Palpations: Abdomen is soft.     Tenderness: There is no abdominal tenderness. There is no guarding.  Musculoskeletal: Normal range of motion.     Right lower leg: No edema.     Left lower leg: No edema.  Skin:    General: Skin is warm and dry.     Findings: No rash.  Neurological:     Mental Status: She is alert.     Motor:  No abnormal muscle tone.     Coordination: Coordination normal.      ED Treatments / Results  Labs (all labs ordered are listed, but only abnormal results are displayed) Labs Reviewed  CBC WITH DIFFERENTIAL/PLATELET - Abnormal; Notable for the following components:      Result Value   WBC 11.8 (*)    RBC 3.80 (*)    Hemoglobin 11.3 (*)    Neutro Abs 9.6 (*)    All other components within normal limits  COMPREHENSIVE METABOLIC PANEL - Abnormal; Notable for the following components:   Potassium 3.2 (*)    Glucose, Bld 111 (*)    Creatinine, Ser 1.14 (*)    Calcium 8.7 (*)    Total Protein 6.2 (*)    Albumin 2.8 (*)    AST 14 (*)    GFR calc non Af Amer 47 (*)    GFR calc Af Amer 54 (*)    All other components within normal limits  BRAIN NATRIURETIC PEPTIDE - Abnormal; Notable for the following components:   B Natriuretic Peptide 1,673.1 (*)    All other components within normal limits  TROPONIN I (HIGH SENSITIVITY) - Abnormal; Notable for the following components:   Troponin I (High Sensitivity) 26 (*)    All other components within normal limits  CULTURE, BLOOD (ROUTINE X 2)  CULTURE, BLOOD (ROUTINE X 2)  POC SARS CORONAVIRUS 2 AG -  ED  TROPONIN I (HIGH SENSITIVITY)    EKG EKG Interpretation  Date/Time:  Monday December 10 2018 08:39:36 EST Ventricular  Rate:  66 PR Interval:    QRS Duration: 124 QT Interval:  446 QTC Calculation: 467 R Axis:   78 Text Interpretation: Undetermined rhythm Right bundle branch block T wave abnormality, consider inferior ischemia Abnormal ECG No significant change was found Confirmed by Gerlene Fee [phone]) on 12/10/2018 9:18:54 AM   Radiology Dg Chest 2 View  Result Date: 12/10/2018 CLINICAL DATA:  Shortness of breath. EXAM: CHEST - 2 VIEW COMPARISON:  05/09/2018 FINDINGS: Heart size remains enlarged. Airways are patent. There is a left-sided pacer device, dual lead place, leads projecting over expected area of right ventricle  right atrium. In atrial appendage closure device remains in place. Osteopenia. Question T12 compression fracture since the previous CT evaluation. Signs of bilateral shoulder arthroplasty and prior trauma to the left chest are similar to prior exams. Areas of opacity above in the right mid chest with lobular contours since the prior study. Lungs remain hyperinflated and likely with background pulmonary emphysema. IMPRESSION: 1. Perihilar opacity in the right chest with lobular contours may represent pneumonia superimposed on background lung disease. Cannot exclude underlying pulmonary mass, consider follow-up to ensure resolution. 2. Age-indeterminate fracture of the T12 vertebral body, new since study of 07/15/2018, correlate clinically. 3. Cardiomegaly with atrial closure device and pacer device in situ. Electronically Signed   By: Zetta Bills M.D.   On: 12/10/2018 09:16    Procedures Procedures (including critical care time)  Medications Ordered in ED Medications  cefTRIAXone (ROCEPHIN) 1 g in sodium chloride 0.9 % 100 mL IVPB (has no administration in time range)  azithromycin (ZITHROMAX) 500 mg in sodium chloride 0.9 % 250 mL IVPB (has no administration in time range)  iohexol (OMNIPAQUE) 350 MG/ML injection 80 mL (80 mLs Intravenous Contrast Given 12/10/18 1158)     Initial Impression / Assessment and Plan / ED Course  I have reviewed the triage vital signs and the nursing notes.  Pertinent labs & imaging results that were available during my care of the patient were reviewed by me and considered in my medical decision making (see chart for details).  Clinical Course as of Dec 10 1430  Mon Dec 10, 2018  1412 CTA chest: IMPRESSION: 1. No demonstrable pulmonary embolus. No thoracic aortic aneurysm. Contrast bolus in the aorta is not sufficient to allow exclusion of dissection as a potential differential consideration by radiography. There is aortic atherosclerosis as well as foci of  great vessel and coronary artery calcification. There is cardiomegaly. Left atrial appendage occlusion device present. Pacemaker leads attached to right atrium and right ventricle.  2. Widespread airspace opacity on the right, most severe in the right lower lobe. This airspace opacity is felt to be indicative of pneumonia. There are multiple more nodular opacities throughout the lungs bilaterally which could have infectious etiology or possibly represent noncavitated septic emboli. It should be noted that underlying neoplasm must be of concern, however. In this regard, advise follow-up CT in 3-4 weeks after treatment for pneumonia. It should be noted that atypical organism pneumonia is a differential consideration in this circumstance. There is a degree of underlying centrilobular emphysematous change.  3. Areas of adenopathy may be secondary to the extensive parenchymal lung changes.  4. Reflux of contrast into the inferior vena cava and hepatic veins may be indicative of increased right heart pressure.  5. Evidence of previous gastric bypass procedure.  Aortic Atherosclerosis (ICD10-I70.0) and Emphysema (ICD10-J43.9).   Electronically Signed By: Lowella Grip III M.D. On: 12/10/2018 12:28   [HK]  Clinical Course User Index [HK] Delia Heady, PA-C       78 year old female presents to ED with a chief complaint of shortness of breath.  Reports worsening shortness of breath for the past few weeks that has worsened in the past few days.  Reports "hacking" cough as well.  No improvement with antihistamines.  On exam patient is tachypneic, accessory muscle usage noted.  Speaking complete sentences.  No lower extremity edema, erythema or calf tenderness of concern for DVT.  She does have a history of blood clots as well as AAA.  EKG shows no changes from prior tracings.  BNP elevated to 1600.  Leukocytosis of 11.8.  CTA of the chest shows no PE but does show what appears to be  multifocal pneumonia versus septic emboli versus neoplasm.  Blood cultures are obtained.  They are recommending antibiotic course trial for resolution.  Due to patient's age, comorbidities will admit to family medicine service for management of her multifocal pneumonia.  I have ordered antibiotics and spoke to patient's daughter [name] for an update.  Final Clinical Impressions(s) / ED Diagnoses   Final diagnoses:  Multifocal pneumonia    ED Discharge Orders    None     Portions of this note were generated with Dragon dictation software. Dictation errors may occur despite best attempts at proofreading.    Delia Heady, PA-C 12/10/18 1432    Maudie Flakes, MD 12/11/18 [phone]

## 2018-12-10 NOTE — ED Notes (Signed)
Pt returns from CT scan, pt remains on tele.

## 2018-12-10 NOTE — H&P (Addendum)
[facility] Admission History and Physical Service Pager: [phone]  Patient name: Deanna Spencer Medical record number: [id number] Date of birth: 06-26-1940 Age: 77 y.o. Gender: female  Primary Care Provider: Nuala Alpha, DO Consultants: none Code Status: DNR Preferred Emergency Contact: daughter Deanna Spencer, [phone]  Chief Complaint:  SOB  Assessment and Plan: Deanna Spencer is a 78 y.o. female presenting with SOB and cough. PMH is significant for PUD, a. 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  SOB  CAP Pt presents today after worsening SOB that has been present for a few weeks, but has become acutely worse over the past few days. She denies fever, chills, CP. Endorses nausea and vomiting, pt has chronic diarrhea. She is tachypneic to mid 20s with accessory muscle usage, speaking in full sentences. Afebrile, VSS, SpO2 95% on RA, tachypnea to 22. Labs reveal leukocytosis to 11.8, ANC 9.6, BNP elevated to 1600. Troponin mildly elevated to 26, will trend but most likely due to demand in setting of acute illness. Blood cultures obtained. CXR reveals possible pneumonia. CTA without PE, but reveals likely multifocal pneumonia versus septic emboli versus neoplasm. Radiology recommends repeat CT chest in approx. 1 month to rule out malignancy. Repeat COVID-19 test pending. Pneumonia is most likely CAP in nature as she lives in the community, however possibility that this is due to virus or atypical organism, or possibly due to emboli. Follow up blood cultures to tailor abx regimen. -Admit to FPTS, cardiac telemetry, attending Dr. Owens Shark -Trend troponin -Continue Azithromycin, Ceftriaxone (11/30-) -F/U blood cultures, deescalate abx as appropriate -F/U COVID-19 test, flu, legionella -Droplet precautions -AM CBC -Up with assistance -Heart healthy diet -Lovenox for DVT ppx -PT/OT eval and treat   Hypokalemia Potassium 3.2 on  admission. K-Dur 40 mEq given - K-DUR 40 mEq PO  AKI Baseline cr about 0.6-0.9, cr on admission 1.14. Most likely  due to decreased oral intake, causing a prerenal injury in setting of nausea/vomit in the last few days. -avoid nephrotoxic agents -Daily BMP  HFrEF 01/28/18 cardiac cath: EF 45%, inferior HK, no significant CAD; 09/18/18 EF 40-45%, L atrial cavity severely dilated at 88cm2/m2. Home medications include metoprolol tartrate 100mg  BID. BNP ~1670, unclear baseline. Likely some false elevation 2/2 afib.  -Continue home medications  -Limit fluid resuscitation, only replete judiciously. -daily weight, strict I/O  H/O DVT Reported in chart, but no specific notes, not on anticoagulation at home.  -Heparin for DVT ppx while in hospital  A. Fib  Sick Sinus Syndrome history of GI bleed Patient on does not appear to be on longterm anticoagulation for a. Fib.  She has a history of multiple GI bleeds, decision has been made to not pursue long-term anticoagulation at this point.  Pacemaker for sick sinus syndrome. Rate controlled by metoprolol tartrate 100mg  BID. -continuous cardiac monitoring -continue metoprolol  -heparin for ppx  AAA Stable at 3cm in April 2020, needs follow up imaging April, 2023.   PUD  Anastomotic ulcer S/P gastric bypass Home medications include pantoprazole BID, famotidine 40mg , carafate. 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  - Continue famotidine 40 mg qhs - Avoid NSAIDs  Neuropathic pain Home medications include gabapentin 300 mg TID. -Continue home meds  FEN/GI: heart healthy diet Prophylaxis: heparin  Disposition: to cardiac telemetry pending improvement from abx  History of Present Illness:  Deanna  A Spencer is a 78 y.o. female presenting with SOB. She called the after hours [facility] line yesterday with concern for SOB.  Pt was recommended to go to ED after she was heard to be breathing heavily, coughed, and had difficulty speaking.  Patient denies any fevers.  Symptoms been going on for a few days.  Endorses sore throat, chills, cough.  Pt presented to ED this morning. She denies sick contacts, is from [place].  Work-up in the ED consisted of a BNP, CMP,CBC, troponin, Covid PCR, blood culture, CT angio, chest x-ray, EKG.  CMP significant for K of 3.2, creatinine 1.14, albumin 2.8, calcium 8.7.  BNP 1673, troponin 26.  CBC significant for white blood cell count 12.  Covid negative.  CT angio showing:No PE.  Pacemaker leads attached to right atrium and right ventricle.  Airspace opacity in RLL.  Multiple nodular opacities throughout Bilateral lungs, infectious versus septic emboli.  Patient was started on azithromycin and ceftriaxone in the emergency department.   Review Of Systems: Per HPI with the following additions:   Review of Systems  Constitutional: Negative for chills and fever.  HENT: Negative.   Eyes: Negative.   Respiratory: Positive for cough and shortness of breath.   Cardiovascular: Negative.   Gastrointestinal: Positive for abdominal pain, nausea and vomiting.  Genitourinary: Negative.   Musculoskeletal: Negative.   Skin: Negative.   Neurological: Negative.   Psychiatric/Behavioral: Negative.     Patient Active Problem List   Diagnosis Date Noted  . Community acquired pneumonia 12/10/2018  . Abnormal loss of weight   . Anastomotic ulcer S/P gastric bypass   . Depression, major, single episode, mild ([place]) 09/20/2018  . Hearing loss 09/20/2018  . History of double vision 09/20/2018  . Fatigue 08/01/2018  . Left shoulder pain 06/30/2018  . Chronic right shoulder pain 06/14/2018  . Anemia due to chronic blood loss 05/07/2018  . Odynophagia 05/07/2018  . Protein-calorie malnutrition ([place]) 05/07/2018  . Absolute anemia   . Cardiac pacemaker in situ 05/06/2018  . History of  anastomosis and bypass hemorrhage 05/06/2018  . Atrial fibrillation with rapid ventricular response ([place])   . Dehydration   . Bilious vomiting with nausea   . Gastrojejunal ulcer   . Acute abdominal pain 05/05/2018  . Rheumatoid arthritis involving both hands ([place]) 09/27/2017  . Seasonal allergies 08/07/2017  . Primary osteoarthritis of right knee 06/02/2017  . Osteoarthritis of right knee 05/30/2017  . Primary osteoarthritis of left hip 02/24/2017  . AAA (abdominal aortic aneurysm) without rupture ([place]) 12/21/2016  . Chronic systolic heart failure ([place]) 12/14/2016  . Atrial fibrillation ([place]) 11/22/2016  . Peripheral neuropathy 11/22/2016  . GERD (gastroesophageal reflux disease) 11/22/2016  . Abdominal pain 11/18/2016  . 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 Parasomnia 11/18/2016    Past Medical History: Past Medical History:  Diagnosis Date  . AAA (abdominal aortic aneurysm) ([place])    3.5cm by CT 11/29/16  . AAA (abdominal aortic aneurysm) without rupture ([place]) 12/21/2016   Stable 3cm 4/20, needs f/u imaging ~4/23  . Acute abdominal pain 05/05/2018  . Anemia   . Atrial fibrillation ([place]) 11/22/2016  . Bee sting 06/28/2017  . Cerumen impaction 06/28/2017  . Childhood asthma    connected to allergies  . Chronic lower back pain   . Chronic systolic heart failure ([place]) 12/14/2016  . Diplopia 09/27/2017  . Diverticulosis   . Duodenal ulcer   . Dyspnea   .  Dysrhythmia    a fib  . GERD (gastroesophageal reflux disease)   . GI bleed    "have had both upper and lower GIB"  . GI bleed 05/07/2018  . Gout   . Heart attack ([place]) 2017  . Heart murmur   . History of anastomosis and bypass hemorrhage 05/06/2018  . History of blood transfusion 2017-2018   "related to bleeding ulcers; 47 units PRBC; 2U plasma" (11/23/2016)  . History of duodenal ulcer 11/18/2016  . History of heart attack 11/18/2016  . History of hiatal hernia   . Hx of blood  clots    in heart  . IBS (irritable bowel syndrome)   . Liver lesion 08/07/2017  . Neuropathy   . Osteoarthritis   . Osteoarthritis of right knee 05/30/2017  . Osteoporosis   . Other chronic pain 11/18/2016  . Parasomnia 11/18/2016  . Perforated viscus 11/22/2016  . Peripheral neuropathy   . Presence of permanent cardiac pacemaker 10/2015   medtronic; Serial # [id number]  . Presence of Watchman left atrial appendage closure device   . Primary osteoarthritis of left hip 02/24/2017  . 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 Primary osteoarthritis of right knee 06/02/2017  . Raynaud disease   . Rheumatoid arteritis ([place])   . Rheumatoid arthritis involving both hands ([place]) 09/27/2017  . S/P exploratory laparotomy 11/22/2016  . Seasonal allergies 08/07/2017  . 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 SSS (sick sinus syndrome) ([place])    a. s/p Medtronic PPM placement in 2017  . Stomach ulcer   . Vision changes 08/07/2017    Past Surgical History: Past Surgical History:  Procedure Laterality Date  . ABDOMINAL HYSTERECTOMY    . APPENDECTOMY    . ARTHROPLASTY Right 06/02/2017   RIGHT PATELLA-FEMORAL ARTHROPLASTY, with placement of a cemented 29 mm Stryker asymmetric triathlon patellar button, revision of the tibial polyethylene bearing.  Marland Kitchen BIOPSY  05/06/2018   Procedure: BIOPSY;  Surgeon: Gatha Mayer, MD;  Location: [facility];  Service: Endoscopy;;  . CARPAL TUNNEL RELEASE Bilateral   . CATARACT EXTRACTION W/ INTRAOCULAR LENS  IMPLANT, BILATERAL Bilateral   . CHOLECYSTECTOMY OPEN    . COLONOSCOPY WITH PROPOFOL N/A 10/30/2018   Procedure: COLONOSCOPY WITH PROPOFOL;  Surgeon: Doran Stabler, MD;  Location: [facility];  Service: Gastroenterology;  Laterality: N/A;  . DILATION AND CURETTAGE OF UTERUS    . ESOPHAGOGASTRODUODENOSCOPY (EGD) WITH PROPOFOL N/A 05/06/2018   Procedure: ESOPHAGOGASTRODUODENOSCOPY (EGD) WITH PROPOFOL;  Surgeon: Gatha Mayer, MD;  Location: [facility];   Service: Endoscopy;  Laterality: N/A;  . ESOPHAGOGASTRODUODENOSCOPY (EGD) WITH PROPOFOL N/A 10/30/2018   Procedure: ESOPHAGOGASTRODUODENOSCOPY (EGD) WITH PROPOFOL;  Surgeon: Doran Stabler, MD;  Location: [facility];  Service: Gastroenterology;  Laterality: N/A;  . FOOT FUSION Right   . INGUINAL HERNIA REPAIR Right X 2  . INSERT / REPLACE / REMOVE PACEMAKER  2017   medtronic  . JOINT REPLACEMENT    . LAPAROTOMY N/A 11/21/2016   Procedure: EXPLORATORY LAPAROTOMY, REVISION OF  GASTROJEJUNOSTOMY, ESOPHAGOGASTROSCOPY;  Surgeon: Clovis Riley, MD;  Location: [facility];  Service: General;  Laterality: N/A;  . LEFT ATRIAL APPENDAGE OCCLUSION  2018   placed  via groin  . LUMBAR FUSION  X 2  . PATELLA-FEMORAL ARTHROPLASTY Right 06/02/2017   Procedure: RIGHT PATELLA-FEMORAL ARTHROPLASTY;  Surgeon: Frederik Pear, MD;  Location: [facility];  Service: Orthopedics;  Laterality: Right;  . REVERSE SHOULDER ARTHROPLASTY Left    reverse shoulder replacement  . ROUX-EN-Y  GASTRIC BYPASS  2005  . ROUX-EN-Y PROCEDURE  20017 X 2   "revisions"  . TONSILLECTOMY AND ADENOIDECTOMY    . TOTAL HIP ARTHROPLASTY Right 02/24/2017   Procedure: TOTAL HIP ARTHROPLASTY;  Surgeon: Earlie Server, MD;  Location: [facility];  Service: Orthopedics;  Laterality: Right;  . TOTAL KNEE ARTHROPLASTY Bilateral   . TOTAL SHOULDER ARTHROPLASTY Bilateral   . TUBAL LIGATION      Social History: Social History   Tobacco Use  . Smoking status: Never Smoker  . Smokeless tobacco: Never Used  Substance Use Topics  . Alcohol use: No    Frequency: Never    Comment: very rare  . Drug use: No   Additional social history: Pt from [place], emergency contact is daughter Deanna Spencer  Please also refer to relevant sections of EMR.  Family History: Family History  Problem Relation Age of Onset  . Heart disease Mother   . Liver disease Father   . Ulcers Father   . Prostate cancer Brother   . Colon cancer Neg Hx     Allergies and  Medications: Allergies  Allergen Reactions  . Demerol [Meperidine] Anaphylaxis  . Nsaids     Patient has had MULTIPLE revisions of her gastrojejunostomy for marginal ulcers including recent perforation   No current facility-administered medications on file prior to encounter.    Current Outpatient Medications on File Prior to Encounter  Medication Sig Dispense Refill  . acetaminophen (TYLENOL) 500 MG tablet Take 1,000 mg every 6 (six) hours as needed by mouth (pain).     . calcium citrate (CALCITRATE) 950 MG tablet Take 200 mg of elemental calcium by mouth 2 (two) times daily.    Marland Kitchen COLCRYS 0.6 MG tablet TAKE 1 TABLET (0.6 MG TOTAL) BY MOUTH 2 (TWO) TIMES DAILY AS NEEDED (GOUT ATTACKS). (Patient taking differently: Take 0.6 mg by mouth 2 (two) times daily as needed (gout). ) 30 tablet 0  . famotidine (PEPCID) 40 MG tablet Take 1 tablet (40 mg total) by mouth at bedtime. 90 tablet 3  . gabapentin (NEURONTIN) 300 MG capsule Please take 2 tablets three times daily starting in the morning 90 capsule 5  . lactose free nutrition (BOOST PLUS) LIQD Take 237 mLs by mouth 3 (three) times daily with meals.  0  . metoprolol tartrate (LOPRESSOR) 100 MG tablet Take 100 mg by mouth 2 (two) times daily.    . Multiple Vitamins-Minerals (MULTIVITAMIN WITH MINERALS) tablet Take 1 tablet by mouth daily.    . ondansetron (ZOFRAN ODT) 4 MG disintegrating tablet Take 1 tablet (4 mg total) by mouth every 8 (eight) hours as needed for nausea or vomiting. 20 tablet 0  . pantoprazole (PROTONIX) 40 MG tablet Take 1 tablet (40 mg total) by mouth 2 (two) times daily. 60 tablet 5  . polyethylene glycol (MIRALAX / GLYCOLAX) 17 g packet Take 17 g by mouth daily. (Patient taking differently: Take 17 g by mouth daily as needed for moderate constipation. ) 14 each 0  . rOPINIRole (REQUIP) 0.5 MG tablet Take 1 tablet (0.5 mg total) by mouth 3 (three) times daily. 90 tablet 2  . sertraline (ZOLOFT) 50 MG tablet Take 1 tablet (50 mg  total) by mouth daily. 30 tablet 3  . sucralfate (CARAFATE) 1 g tablet Take 1 tablet (1 g total) by mouth every 6 (six) hours. 360 tablet 3  . tolterodine (DETROL LA) 4 MG 24 hr capsule Take 1 capsule (4 mg total) by mouth daily. 30 capsule 2  .  traMADol (ULTRAM) 50 MG tablet Take 1 tablet (50 mg total) by mouth every 6 (six) hours as needed for moderate pain. 30 tablet 0    Objective: BP (!) 139/96   Pulse 71   Temp 98.5 F (36.9 C) (Oral)   Resp (!) 22   Ht 4\' 11"  (1.499 m)   Wt 51.3 kg   SpO2 95%   BMI 22.82 kg/m  Exam: Physical Exam Constitutional:      General: She is not in acute distress.    Appearance: She is well-developed and normal weight. She is not ill-appearing or toxic-appearing.  HENT:     Head: Normocephalic and atraumatic.  Cardiovascular:     Rate and Rhythm: Normal rate and regular rhythm.     Heart sounds: No murmur. No friction rub. No gallop.   Pulmonary:     Effort: Tachypnea and accessory muscle usage present.     Breath sounds: Examination of the right-upper field reveals wheezing. Examination of the left-upper field reveals wheezing. Examination of the left-lower field reveals decreased breath sounds. Decreased breath sounds and wheezing present. No rhonchi or rales.  Abdominal:     General: Bowel sounds are normal.     Palpations: Abdomen is soft.  Musculoskeletal:     Right lower leg: No edema.     Left lower leg: No edema.  Skin:    General: Skin is warm and dry.     Capillary Refill: Capillary refill takes less than 2 seconds.  Neurological:     General: No focal deficit present.     Mental Status: She is alert and oriented to person, place, and time.  Psychiatric:        Mood and Affect: Mood normal.        Behavior: Behavior normal.     Labs and Imaging: CBC BMET  Recent Labs  Lab 12/10/18 0945  WBC 11.8*  HGB 11.3*  HCT 36.2  PLT 291   Recent Labs  Lab 12/10/18 0945  NA 139  K 3.2*  CL 103  CO2 27  BUN 20  CREATININE  1.14*  GLUCOSE 111*  CALCIUM 8.7*     EKG: EKG Interpretation  Date/Time:  Monday December 10 2018 08:39:36 EST Ventricular Rate:  66 PR Interval:    QRS Duration: 124 QT Interval:  446 QTC Calculation: 467 R Axis:   78 Text Interpretation: Undetermined rhythm Right bundle branch block T wave abnormality, consider inferior ischemia Abnormal ECG No significant change was found Confirmed by Gerlene Fee [phone]) on 12/10/2018 9:18:54 AM   Dg Chest 2 View  Result Date: 12/10/2018 CLINICAL DATA:  Shortness of breath. EXAM: CHEST - 2 VIEW COMPARISON:  05/09/2018 FINDINGS: Heart size remains enlarged. Airways are patent. There is a left-sided pacer device, dual lead place, leads projecting over expected area of right ventricle right atrium. In atrial appendage closure device remains in place. Osteopenia. Question T12 compression fracture since the previous CT evaluation. Signs of bilateral shoulder arthroplasty and prior trauma to the left chest are similar to prior exams. Areas of opacity above in the right mid chest with lobular contours since the prior study. Lungs remain hyperinflated and likely with background pulmonary emphysema. IMPRESSION: 1. Perihilar opacity in the right chest with lobular contours may represent pneumonia superimposed on background lung disease. Cannot exclude underlying pulmonary mass, consider follow-up to ensure resolution. 2. Age-indeterminate fracture of the T12 vertebral body, new since study of 07/15/2018, correlate clinically. 3. Cardiomegaly with atrial closure device and  pacer device in situ. Electronically Signed   By: Zetta Bills M.D.   On: 12/10/2018 09:16   Ct Angio Chest Pe W/cm &/or Wo Cm  Result Date: 12/10/2018 CLINICAL DATA:  Shortness of breath EXAM: CT ANGIOGRAPHY CHEST WITH CONTRAST TECHNIQUE: Multidetector CT imaging of the chest was performed using the standard protocol during bolus administration of intravenous contrast. Multiplanar CT image  reconstructions and MIPs were obtained to evaluate the vascular anatomy. CONTRAST:  81mL OMNIPAQUE IOHEXOL 350 MG/ML SOLN COMPARISON:  Chest radiograph December 10, 2018 FINDINGS: Cardiovascular: There is no demonstrable pulmonary embolus. There is no appreciable thoracic aortic aneurysm. No dissection is seen; the contrast bolus in the aorta is not sufficient for confident exclusion of potential dissection. The visualized great vessels show foci of atherosclerotic calcification. There is aortic atherosclerosis as well as foci of coronary artery calcification. There is no pericardial effusion or pericardial thickening. There is cardiomegaly. There are pacemaker leads attached to the right atrium and right ventricle. There is a left atrial appendage occlusion device present. Mediastinum/Nodes: There is susceptibility artifact in the region of the thyroid due to bilateral total shoulder replacements. No obvious thyroid lesion evident. There is a lymph node anterior to the upper carina on the right measuring 1.5 x 1.4 cm. There is subcarinal adenopathy measuring 2.1 x 1.5 cm. No esophageal lesions are appreciable. Lungs/Pleura: There is airspace consolidation in portions of the posterior segment of the right upper lobe as well as in multiple segments in the right lower lobe and lateral segment right middle lobe. There arm multiple superimposed nodular type opacities throughout both lower lobes and to a lesser extent in the right upper and right middle lobe regions. Nodular opacities range in size from as small as 2 mm to as large as 2 cm. No areas of cavitation evident. There is a degree of underlying centrilobular emphysematous change. There are minimal pleural effusions bilaterally. Upper Abdomen: In the visualized upper abdomen, there is evidence of previous gastric bypass procedure. There is aortic atherosclerosis. There is reflux of contrast into the inferior vena cava and hepatic veins. Visualized upper abdominal  structures otherwise appear unremarkable. Musculoskeletal: There is degenerative change in thoracic spine. There are no blastic or lytic bone lesions. Pacemaker device is noted on the left. There are total shoulder replacements bilaterally. No focal chest wall lesion evident. Review of the MIP images confirms the above findings. IMPRESSION: 1. No demonstrable pulmonary embolus. No thoracic aortic aneurysm. Contrast bolus in the aorta is not sufficient to allow exclusion of dissection as a potential differential consideration by radiography. There is aortic atherosclerosis as well as foci of great vessel and coronary artery calcification. There is cardiomegaly. Left atrial appendage occlusion device present. Pacemaker leads attached to right atrium and right ventricle. 2. Widespread airspace opacity on the right, most severe in the right lower lobe. This airspace opacity is felt to be indicative of pneumonia. There are multiple more nodular opacities throughout the lungs bilaterally which could have infectious etiology or possibly represent noncavitated septic emboli. It should be noted that underlying neoplasm must be of concern, however. In this regard, advise follow-up CT in 3-4 weeks after treatment for pneumonia. It should be noted that atypical organism pneumonia is a differential consideration in this circumstance. There is a degree of underlying centrilobular emphysematous change. 3. Areas of adenopathy may be secondary to the extensive parenchymal lung changes. 4. Reflux of contrast into the inferior vena cava and hepatic veins may be indicative of increased right  heart pressure. 5.  Evidence of previous gastric bypass procedure. Aortic Atherosclerosis (ICD10-I70.0) and Emphysema (ICD10-J43.9). Electronically Signed   By: Lowella Grip III M.D.   On: 12/10/2018 12:28    Gladys Damme, MD 12/10/2018, 3:26 PM PGY-1, [facility] pager: [phone], text pages  welcome ----------------------------------------------------------------------------------------- Upper Level Addendum: I have seen and evaluated this patient along with Dr. Chauncey Reading and reviewed the above note, making necessary revisions in blue.  Guadalupe Dawn MD PGY-3 Family Medicine Resident

## 2018-12-10 NOTE — Discharge Summary (Addendum)
[facility] Discharge Summary  Patient name: Deanna Spencer record number: [id number] Date of birth: Sep 19, 1940 Age: 78 y.o. Gender: female Date of Admission: 12/10/2018  Date of Discharge: 12/11/2018 Admitting Physician: Martyn Malay, MD  Primary Care Provider: Nuala Alpha, DO Consultants: None  Indication for Hospitalization: Community-acquired pneumonia  Discharge Diagnoses/Problem List:  Rheumatoid arthritis Atrial fibrillation Recurrent upper GI bleed Restless leg syndrome Anxiety Compression fracture HFrEF  Disposition: Home  Discharge Condition: Medically stable for discharge  Discharge Exam:  General: Alert, cooperative and appears to be in no acute distress. Well appearing 78 yo HEENT: atraumatic, normocephalic, no scleral icterus, normal cojunctiva Cardio: Normal S1 and S2,. Rhythm is irregular. No murmurs or rubs.   Pulm: Clear to auscultation bilaterally, no crackles, wheezing, or diminished breath sounds. Normal respiratory effort Abdomen: Bowel sounds normal. Abdomen soft and non-tender.  Extremities: No peripheral edema. Warm/ well perfused.  Strong radial pulse Neuro: Cranial nerves grossly intact  Brief Hospital Course:  Presented 12/10/2018 for worsening shortness of breath over the past few weeks. She was afebrile and satting 95% on room air.  She was tachypneic with a respiratory rate in the 20s.  CXR showed possible pneumonia.  CTA showed no PE but revealed likely multifocal pneumonia versus septic emboli versus neoplasm.  Radiology recommended repeat CT chest in approximately 1 month to rule out malignancy.  Patient was started on azithromycin and ceftriaxone.  Blood cultures were drawn which showed no growth at 2 days, COVID-19 test (negative), respiratory panel (negative), Legionella test (negative) were done.  Patient's BNP was 1670.  Patient has a history of HFrEF with an EF on 09/18/2018 of 40-45%.  Troponin was  mildly elevated at 26 likely secondary to demand ischemia in setting of pneumonia.  On day 2 of admission, patient's condition had markedly improved.  She was switched to Cefdinir and azithromycin on discharge.  At the time of discharge, her vitals were stable and she was breathing comfortably and satting well on room air.  She was ambulating well with physical therapy and was discharged on 12/1.  Patient should have BMP on Friday, 4th December follow-up at [facility].  Issues for Follow Up:  1) Repeat CT chest in 1 month to rule out malignancy per Radiology  2) AAA stable at 3cm in April 2020, needs follow up imaging April, 2023.   3) Compression fracture, recommend discussion of denosumab given hx of PUD as oupatient.  4) repeat BMP on Friday, 4th December, [facility] follow-up  Significant Procedures: none   Significant Labs and Imaging:  Recent Labs  Lab 12/10/18 0945 12/11/18 0418  WBC 11.8* 9.3  HGB 11.3* 10.9*  HCT 36.2 34.7*  PLT 291 276   Recent Labs  Lab 12/10/18 0945 12/11/18 0418  NA 139 142  K 3.2* 3.1*  CL 103 104  CO2 27 23  GLUCOSE 111* 137*  BUN 20 15  CREATININE 1.14* 1.11*  CALCIUM 8.7* 8.7*  ALKPHOS 54  --   AST 14*  --   ALT 8  --   ALBUMIN 2.8*  --    Results/Tests Pending at Time of Discharge:   Discharge Medications:  Allergies as of 12/11/2018      Reactions   Demerol [meperidine] Anaphylaxis   Nsaids    Patient has had 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   azithromycin 500 MG tablet Commonly known as: Zithromax Take 1 tablet (500 mg total) by mouth once for 1 dose.   Calcitrate 950 MG tablet Generic drug: calcium citrate Take 200 mg of elemental calcium by mouth daily.   cefdinir 300 MG capsule Commonly known as:  OMNICEF Take 1 capsule (300 mg total) by mouth daily.   cholecalciferol 25 MCG (1000 UT) tablet Commonly known as: VITAMIN D3 Take 1 tablet (1,000 Units total) by mouth daily.   famotidine 40 MG tablet Commonly known as: PEPCID Take 1 tablet (40 mg total) by mouth at bedtime.   gabapentin 300 MG capsule Commonly known as: NEURONTIN Please take 2 tablets three times daily starting in the morning What changed:   how much to take  how to take this  when to take this  additional instructions   metoprolol tartrate 100 MG tablet Commonly known as: LOPRESSOR Take 100 mg by mouth 2 (two) times daily.   pantoprazole 40 MG tablet Commonly known as: PROTONIX Take 2 tablets (80 mg total) by mouth daily.   rOPINIRole 0.5 MG tablet Commonly known as: REQUIP Take 1 tablet (0.5 mg total) by mouth 3 (three) times daily. What changed:   how much to take  when to take this  additional instructions   sertraline 50 MG tablet Commonly known as: ZOLOFT Take 1 tablet (50 mg total) by mouth daily.   sucralfate 1 g tablet Commonly known as: Carafate Take 1 tablet (1 g total) by mouth every 6 (six) hours.   Toviaz 4 MG Tb24 tablet Generic drug: fesoterodine Take 4 mg by mouth daily.       Discharge Instructions: Please refer to Patient Instructions section of EMR for full details.  Patient was counseled important signs and symptoms that should prompt return to medical care, changes in medications, dietary instructions, activity restrictions, and follow up appointments.   Follow-Up Appointments: Friday, 4th December, follow-up at [facility], MD 12/12/2018, 9:40 PM PGY-1, [place] Upper-Level Resident Addendum   I have discussed the above with the original author and agree with their documentation. My edits for correction/addition/clarification are in green. Please see also any attending notes.   [place] Constable, D.O. PGY-2, [place]  Family Medicine 12/13/2018 3:23 PM  [place] Service pager: [phone] (text pages welcome through [place])

## 2018-12-10 NOTE — Telephone Encounter (Signed)
Pt called nurse line and LVM after hours/holiday weekend. Pt called Dr on call and was told to go to ED. Pt said she did not feel she was at the point of an ED visit. Pt went to the ED today 11/30. Ottis Stain, CMA

## 2018-12-10 NOTE — ED Notes (Signed)
Admitting at bedside 

## 2018-12-10 NOTE — ED Triage Notes (Signed)
Pt reports Sob for the last week. Denies recent fevers. Denies sick contacts. VSS. Nad at present.

## 2018-12-11 ENCOUNTER — Other Ambulatory Visit: Payer: Self-pay

## 2018-12-11 ENCOUNTER — Other Ambulatory Visit: Payer: Self-pay | Admitting: Pharmacy Technician

## 2018-12-11 DIAGNOSIS — S22000A Wedge compression fracture of unspecified thoracic vertebra, initial encounter for closed fracture: Secondary | ICD-10-CM

## 2018-12-11 LAB — CBC WITH DIFFERENTIAL/PLATELET
Abs Immature Granulocytes: 0.04 10*3/uL (ref 0.00–0.07)
Basophils Absolute: 0.1 10*3/uL (ref 0.0–0.1)
Basophils Relative: 1 %
Eosinophils Absolute: 0.2 10*3/uL (ref 0.0–0.5)
Eosinophils Relative: 3 %
HCT: 34.7 % — ABNORMAL LOW (ref 36.0–46.0)
Hemoglobin: 10.9 g/dL — ABNORMAL LOW (ref 12.0–15.0)
Immature Granulocytes: 0 %
Lymphocytes Relative: 13 %
Lymphs Abs: 1.2 10*3/uL (ref 0.7–4.0)
MCH: 29.7 pg (ref 26.0–34.0)
MCHC: 31.4 g/dL (ref 30.0–36.0)
MCV: 94.6 fL (ref 80.0–100.0)
Monocytes Absolute: 0.6 10*3/uL (ref 0.1–1.0)
Monocytes Relative: 6 %
Neutro Abs: 7.2 10*3/uL (ref 1.7–7.7)
Neutrophils Relative %: 77 %
Platelets: 276 10*3/uL (ref 150–400)
RBC: 3.67 MIL/uL — ABNORMAL LOW (ref 3.87–5.11)
RDW: 14.8 % (ref 11.5–15.5)
WBC: 9.3 10*3/uL (ref 4.0–10.5)
nRBC: 0 % (ref 0.0–0.2)

## 2018-12-11 LAB — BASIC METABOLIC PANEL
Anion gap: 15 (ref 5–15)
BUN: 15 mg/dL (ref 8–23)
CO2: 23 mmol/L (ref 22–32)
Calcium: 8.7 mg/dL — ABNORMAL LOW (ref 8.9–10.3)
Chloride: 104 mmol/L (ref 98–111)
Creatinine, Ser: 1.11 mg/dL — ABNORMAL HIGH (ref 0.44–1.00)
GFR calc Af Amer: 56 mL/min — ABNORMAL LOW (ref >60)
GFR calc non Af Amer: 48 mL/min — ABNORMAL LOW (ref >60)
Glucose, Bld: 137 mg/dL — ABNORMAL HIGH (ref 70–99)
Potassium: 3.1 mmol/L — ABNORMAL LOW (ref 3.5–5.1)
Sodium: 142 mmol/L (ref 135–145)

## 2018-12-11 LAB — SARS CORONAVIRUS 2 (TAT 6-24 HRS): SARS Coronavirus 2: NEGATIVE

## 2018-12-11 LAB — HIV ANTIBODY (ROUTINE TESTING W REFLEX): HIV Screen 4th Generation wRfx: NONREACTIVE

## 2018-12-11 MED ORDER — AZITHROMYCIN 500 MG PO TABS: 500.0000 mg | ORAL_TABLET | Freq: Once | ORAL | 0 refills | Status: AC

## 2018-12-11 MED ORDER — ROPINIROLE HCL 1 MG PO TABS
1.0000 mg | ORAL_TABLET | Freq: Every day | ORAL | Status: DC
  Filled 2018-12-11: qty 1

## 2018-12-11 MED ORDER — ROPINIROLE HCL 0.5 MG PO TABS
0.5000 mg | ORAL_TABLET | Freq: Every day | ORAL | Status: DC
  Administered 2018-12-11: 0.5 mg via ORAL
  Filled 2018-12-11: qty 1

## 2018-12-11 MED ORDER — ROPINIROLE HCL 0.5 MG PO TABS: 0.5000 mg | ORAL_TABLET | ORAL | Status: DC

## 2018-12-11 MED ORDER — POTASSIUM CHLORIDE CRYS ER 20 MEQ PO TBCR
40.0000 meq | EXTENDED_RELEASE_TABLET | Freq: Once | ORAL | Status: AC
  Administered 2018-12-11: 40 meq via ORAL
  Filled 2018-12-11: qty 2

## 2018-12-11 MED ORDER — SODIUM CHLORIDE 0.9 % IV SOLN
1.0000 g | INTRAVENOUS | Status: DC
  Administered 2018-12-11: 1 g via INTRAVENOUS
  Filled 2018-12-11: qty 10

## 2018-12-11 MED ORDER — PANTOPRAZOLE SODIUM 40 MG PO TBEC: 80.0000 mg | DELAYED_RELEASE_TABLET | Freq: Every day | ORAL | 0 refills | Status: AC

## 2018-12-11 MED ORDER — VITAMIN D 25 MCG (1000 UNIT) PO TABS: 1000.0000 [IU] | ORAL_TABLET | Freq: Every day | ORAL | 0 refills | Status: DC

## 2018-12-11 MED ORDER — SODIUM CHLORIDE 0.9 % IV SOLN
500.0000 mg | INTRAVENOUS | Status: DC
  Administered 2018-12-11: 500 mg via INTRAVENOUS
  Filled 2018-12-11: qty 500

## 2018-12-11 MED ORDER — CEFDINIR 300 MG PO CAPS: 300.0000 mg | ORAL_CAPSULE | Freq: Every day | ORAL | 0 refills | Status: DC

## 2018-12-11 MED ORDER — SODIUM CHLORIDE 0.9 % IV SOLN
INTRAVENOUS | Status: DC | PRN
  Administered 2018-12-11: 250 mL via INTRAVENOUS

## 2018-12-11 NOTE — ED Notes (Signed)
Tele

## 2018-12-11 NOTE — Evaluation (Signed)
Physical Therapy Evaluation Patient Details Name: Deanna Spencer MRN: [id number] DOB: 06-05-1940 Today's Date: 12/11/2018   History of Present Illness  78 year old woman with history of RA, atrial fibrillation, recurrent UGI bleeding, restless leg, and anxiety presenting with community acquired pneuomonia.  Clinical Impression  Patient presents with mobility close to her baseline.  Currently occasional minguard in hallway with ambulation with cane.  Feel she can progress to home safely and likely not need follow up PT.  Will follow acutely and maximize mobility and safety prior to d/c home with family support (intermittent).     Follow Up Recommendations No PT follow up    Equipment Recommendations  None recommended by PT    Recommendations for Other Services       Precautions / Restrictions Precautions Precautions: Fall Precaution Comments: 3-4 falls in 6 months usually oudoor uneven terrain, has been able to recover on her own      Mobility  Bed Mobility Overal bed mobility: Modified Independent                Transfers Overall transfer level: Modified independent Equipment used: Straight cane             General transfer comment: with increased time  Ambulation/Gait Ambulation/Gait assistance: Supervision;Min guard Gait Distance (Feet): 150 Feet Assistive device: Straight cane Gait Pattern/deviations: Step-to pattern;Step-through pattern;Decreased stride length;Shuffle     General Gait Details: minguard at times for safety in hallway, denies SOB with ambulation, SpO2 on RA 92% with minimal dyspnea  Stairs            Wheelchair Mobility    Modified Rankin (Stroke Patients Only)       Balance Overall balance assessment: Needs assistance   Sitting balance-Leahy Scale: Good     Standing balance support: No upper extremity supported Standing balance-Leahy Scale: Fair                               Pertinent Vitals/Pain Pain  Assessment: 0-10 Pain Score: 7  Pain Location: R side and back Pain Descriptors / Indicators: Aching;Sore Pain Intervention(s): Monitored during session;Repositioned;Limited activity within patient's tolerance    Home Living Family/patient expects to be discharged to:: Private residence Living Arrangements: Alone Available Help at Discharge: Family;Available PRN/intermittently Type of Home: House Home Access: Stairs to enter Entrance Stairs-Rails: Psychiatric nurse of Steps: 6 Home Layout: One level Home Equipment: Cane - single point;Walker - 2 wheels      Prior Function Level of Independence: Independent;Independent with assistive device(s)         Comments: uses cane outdoors     Hand Dominance        Extremity/Trunk Assessment   Upper Extremity Assessment Upper Extremity Assessment: Generalized weakness(history of shoulder replacement)    Lower Extremity Assessment Lower Extremity Assessment: LLE deficits/detail;RLE deficits/detail RLE Deficits / Details: AROM WFL, strength 4/5 knee extension 3/5 hip flexion, R foot RA deformity LLE Deficits / Details: AROM WFL, strength hip flexion 3+/5, knee extension 4/5    Cervical / Trunk Assessment Cervical / Trunk Assessment: Kyphotic;Other exceptions Cervical / Trunk Exceptions: scoliotic  Communication   Communication: No difficulties  Cognition Arousal/Alertness: Awake/alert Behavior During Therapy: WFL for tasks assessed/performed Overall Cognitive Status: Within Functional Limits for tasks assessed  General Comments General comments (skin integrity, edema, etc.): Reports daughter lives/works close and checks up on her frequently    Exercises     Assessment/Plan    PT Assessment Patient needs continued PT services  PT Problem List Decreased activity tolerance;Decreased balance;Decreased mobility;Decreased knowledge of  precautions;Decreased safety awareness;Pain       PT Treatment Interventions DME instruction;Therapeutic activities;Balance training;Gait training;Therapeutic exercise;Functional mobility training;Patient/family education    PT Goals (Current goals can be found in the Care Plan section)  Acute Rehab PT Goals Patient Stated Goal: to go home PT Goal Formulation: With patient Time For Goal Achievement: 12/18/18 Potential to Achieve Goals: Good    Frequency Min 3X/week   Barriers to discharge        Co-evaluation               AM-PAC PT "6 Clicks" Mobility  Outcome Measure Help needed turning from your back to your side while in a flat bed without using bedrails?: None Help needed moving from lying on your back to sitting on the side of a flat bed without using bedrails?: None Help needed moving to and from a bed to a chair (including a wheelchair)?: None Help needed standing up from a chair using your arms (e.g., wheelchair or bedside chair)?: None Help needed to walk in hospital room?: A Little Help needed climbing 3-5 steps with a railing? : A Little 6 Click Score: 22    End of Session   Activity Tolerance: Patient tolerated treatment well Patient left: in bed;with call bell/phone within reach   PT Visit Diagnosis: Other abnormalities of gait and mobility (R26.89);Difficulty in walking, not elsewhere classified (R26.2);History of falling (Z91.81)    Time: [id number] PT Time Calculation (min) (ACUTE ONLY): 25 min   Charges:   PT Evaluation $PT Eval Moderate Complexity: 1 Mod PT Treatments $Gait Training: 8-22 mins        Deanna Spencer, PT Acute Rehabilitation Services [phone] 12/11/2018   Deanna Spencer 12/11/2018, 2:08 PM

## 2018-12-11 NOTE — Progress Notes (Signed)
Family Medicine Teaching Service Daily Progress Note Intern Pager: [phone]  Patient name: Deanna Spencer Medical record number: [id number] Date of birth: Apr 28, 1940 Age: 78 y.o. Gender: female  Primary Care Provider: Nuala Alpha, DO Consultants: None  Code Status: DNR   Pt Overview and Major Events to Date:  Deanna Spencer is a 78 y.o. female presenting with SOB and cough. PMH is significant for PUD, a. 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  Assessment and Plan:  SOB  CAP Denies shortness of breath but has had dry mouth overnight which have caught her breath. Denies chest pain Tachypnea to RR 24, sats 100% on RA, Apyrexia WBC 9.3 today, 11.8 on 11/30, BNP on admission 1600, COVID negative  -Droplet precautions -Continue Azithromycin, Ceftriaxone (11/30-) -F/U blood cultures, deescalate abx as appropriate -F/U legionella -Up with assistance -PT/OT eval and treat, if can cleared then can discharge home today, will discharge on PO Cefdinir (7 days total) and Azithromycin, repeat labs on 12/4 -Repeat CT chest in 1 month to rule out malignancy per Radiology   HFrEF Weight on 11.30 51.25 kg, Is 452, no document UO since admission  -Continue home medications  -Limit fluid resuscitation, only replete judiciously. -daily weight, strict I/O  Hypokalemia K 3.1 today, 3.2 on admission - K-DUR 40 mEq PO  AKI like 2/2 dehydration  Cr 1.11 today, Cr 1.14 on 11/30 stable. Baseline 0.6-0.9  -avoid nephrotoxic agents -Daily BMP  H/O DVT Reported in chart, but no specific notes, not on anticoagulation at home.  -Heparin for DVT ppx while in hospital  A. Fib  Sick Sinus Syndrome history of GI bleed Denies dizziness or chest pain H 57-95 overnight   Pt has pacemaker for sicksinus syndrome. Not on longterm anticoagulation, hx of multiple GI bleeds so decision has been made not to purse long term anticoagulation at this point.  -continuous  cardiac monitoring -continue metoprolol 100mg  BID -Heparin for ppx  AAA Stable at 3cm in April 2020, needs follow up imaging April, 2023.   PUD  Anastomotic ulcer S/P gastric bypass Home meds: include pantoprazole BID, famotidine 40mg , carafate. 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  - Continue famotidine 40 mg qhs - Avoid NSAIDs  Neuropathic pain Home medications include gabapentin 300 mg TID. -Continue home meds  FEN/GI: Regular diet  Prophylaxis: heparin  Disposition: Home when medically stable  Subjective:  Patient is doing well today, has had intermittent episodes of coughing overnight which could have breath.  Reports that ambulating has helped.  No fevers or chest pain.  Very conversational this morning and enjoyed talking about meals she enjoys cooking.  Objective: Temp:  [97.8 F (36.6 C)] 97.8 F (36.6 C) (12/01 1144) Pulse Rate:  [57-95] 65 (12/01 1144) Resp:  [16-24] 20 (12/01 1144) BP: (124-159)/(65-99) 159/99 (12/01 1144) SpO2:  [94 %-100 %] 99 % (12/01 1144) FiO2 (%):  [21 %] 21 % (11/30 1547)  Examination: General: Alert, cooperative and appears to be in no acute distress. Well appearing 78 yo HEENT: atraumatic, normocephalic, no scleral icterus, normal cojunctiva Cardio: Normal S1 and S2, no S3 or S4. Rhythm is regular.  No murmurs or rubs.   Pulm: Clear to auscultation bilaterally, no crackles, wheezing, or diminished breath sounds. Normal respiratory effort Abdomen: Bowel sounds normal. Abdomen soft and non-tender.  Extremities: No peripheral edema. Warm/ well perfused.  Strong  radial pulse Neuro: Cranial nerves grossly intact  Laboratory: Recent Labs  Lab 12/10/18 0945 12/11/18 0418  WBC 11.8* 9.3  HGB 11.3* 10.9*  HCT 36.2 34.7*  PLT 291 276   Recent Labs  Lab 12/10/18 0945 12/11/18 0418  NA 139 142  K 3.2* 3.1*  CL  103 104  CO2 27 23  BUN 20 15  CREATININE 1.14* 1.11*  CALCIUM 8.7* 8.7*  PROT 6.2*  --   BILITOT 0.9  --   ALKPHOS 54  --   ALT 8  --   AST 14*  --   GLUCOSE 111* 137*     Imaging/Diagnostic Tests: Dg Chest 2 View  Result Date: 12/10/2018 CLINICAL DATA:  Shortness of breath. EXAM: CHEST - 2 VIEW COMPARISON:  05/09/2018 FINDINGS: Heart size remains enlarged. Airways are patent. There is a left-sided pacer device, dual lead place, leads projecting over expected area of right ventricle right atrium. In atrial appendage closure device remains in place. Osteopenia. Question T12 compression fracture since the previous CT evaluation. Signs of bilateral shoulder arthroplasty and prior trauma to the left chest are similar to prior exams. Areas of opacity above in the right mid chest with lobular contours since the prior study. Lungs remain hyperinflated and likely with background pulmonary emphysema. IMPRESSION: 1. Perihilar opacity in the right chest with lobular contours may represent pneumonia superimposed on background lung disease. Cannot exclude underlying pulmonary mass, consider follow-up to ensure resolution. 2. Age-indeterminate fracture of the T12 vertebral body, new since study of 07/15/2018, correlate clinically. 3. Cardiomegaly with atrial closure device and pacer device in situ. Electronically Signed   By: Zetta Bills M.D.   On: 12/10/2018 09:16   Ct Angio Chest Pe W/cm &/or Wo Cm  Result Date: 12/10/2018 CLINICAL DATA:  Shortness of breath EXAM: CT ANGIOGRAPHY CHEST WITH CONTRAST TECHNIQUE: Multidetector CT imaging of the chest was performed using the standard protocol during bolus administration of intravenous contrast. Multiplanar CT image reconstructions and MIPs were obtained to evaluate the vascular anatomy. CONTRAST:  68mL OMNIPAQUE IOHEXOL 350 MG/ML SOLN COMPARISON:  Chest radiograph December 10, 2018 FINDINGS: Cardiovascular: There is no demonstrable pulmonary embolus. There  is no appreciable thoracic aortic aneurysm. No dissection is seen; the contrast bolus in the aorta is not sufficient for confident exclusion of potential dissection. The visualized great vessels show foci of atherosclerotic calcification. There is aortic atherosclerosis as well as foci of coronary artery calcification. There is no pericardial effusion or pericardial thickening. There is cardiomegaly. There are pacemaker leads attached to the right atrium and right ventricle. There is a left atrial appendage occlusion device present. Mediastinum/Nodes: There is susceptibility artifact in the region of the thyroid due to bilateral total shoulder replacements. No obvious thyroid lesion evident. There is a lymph node anterior to the upper carina on the right measuring 1.5 x 1.4 cm. There is subcarinal adenopathy measuring 2.1 x 1.5 cm. No esophageal lesions are appreciable. Lungs/Pleura: There is airspace consolidation in portions of the posterior segment of the right upper lobe as well as in multiple segments in the right lower lobe and lateral segment right middle lobe. There arm multiple superimposed nodular type opacities throughout both lower lobes and to a lesser extent in the right upper and right middle lobe regions. Nodular opacities range in size from as small as 2 mm to as large as 2 cm. No areas of cavitation evident. There is a degree of underlying centrilobular emphysematous change. There are minimal pleural effusions bilaterally.  Upper Abdomen: In the visualized upper abdomen, there is evidence of previous gastric bypass procedure. There is aortic atherosclerosis. There is reflux of contrast into the inferior vena cava and hepatic veins. Visualized upper abdominal structures otherwise appear unremarkable. Musculoskeletal: There is degenerative change in thoracic spine. There are no blastic or lytic bone lesions. Pacemaker device is noted on the left. There are total shoulder replacements bilaterally. No  focal chest wall lesion evident. Review of the MIP images confirms the above findings. IMPRESSION: 1. No demonstrable pulmonary embolus. No thoracic aortic aneurysm. Contrast bolus in the aorta is not sufficient to allow exclusion of dissection as a potential differential consideration by radiography. There is aortic atherosclerosis as well as foci of great vessel and coronary artery calcification. There is cardiomegaly. Left atrial appendage occlusion device present. Pacemaker leads attached to right atrium and right ventricle. 2. Widespread airspace opacity on the right, most severe in the right lower lobe. This airspace opacity is felt to be indicative of pneumonia. There are multiple more nodular opacities throughout the lungs bilaterally which could have infectious etiology or possibly represent noncavitated septic emboli. It should be noted that underlying neoplasm must be of concern, however. In this regard, advise follow-up CT in 3-4 weeks after treatment for pneumonia. It should be noted that atypical organism pneumonia is a differential consideration in this circumstance. There is a degree of underlying centrilobular emphysematous change. 3. Areas of adenopathy may be secondary to the extensive parenchymal lung changes. 4. Reflux of contrast into the inferior vena cava and hepatic veins may be indicative of increased right heart pressure. 5.  Evidence of previous gastric bypass procedure. Aortic Atherosclerosis (ICD10-I70.0) and Emphysema (ICD10-J43.9). Electronically Signed   By: Lowella Grip III M.D.   On: 12/10/2018 12:28    Lattie Haw, MD 12/11/2018, 12:51 PM PGY-1, [place] Intern pager: [phone], text pages welcome

## 2018-12-11 NOTE — Discharge Instructions (Signed)
You received both of your antibiotics today. You can start the oral pills tomorrow (12/2).    You are scheduled for follow up with Dr. Garlan Fillers on Monday 12/7.  Your x-ray showed signs of osteoporosis---this is an aging of the bones that occurs in women. We recommend considering a medication to improve bone health after discussion with Dr. Garlan Fillers.

## 2018-12-11 NOTE — Patient Outreach (Signed)
[facility]) Care Management  12/11/2018  Deanna Spencer 1940-10-10 [id number]    Care coordination call placed to [place] in regards toquestion about the patient assistance program.  Spoke to a representative who informed class A medications such as [name] must be delivered to the provider's office. [facility] prefers that medications not be delivered to the office.  Will route note to [place] that patient's case is being closed due to patient not mailing back the applications as she has stated she will do on a few occasions.  [name] P. Charmion Hapke, [place] Management [phone]

## 2018-12-11 NOTE — Consult Note (Signed)
   [facility] CM Inpatient Consult   12/11/2018  Deanna Spencer 24-Jul-1940 [id number]    [facility] Active status:  Patient is currently active with [place] Management services. She had been previouslyoutreached and engaged by[employer] Corporate investment banker for care coordination; [facility] SW for community resources and [place] for disease management.  Our community based plan of care has focused on disease management and community resource support.  Patientis currently engaged by [facility] for medication assistance and medication adherence.   Patient was checked for 19% risk score for unplanned readmission andhospitalizations as a benefit from her Medicare/ [employer] ACO plan.  Review of medical record reveals MD history and physical on 12/10/2018 showing as: Deanna Spencer is a 78 y.o. female presenting with SOB and cough. PMH is significant for PUD, A-Fib, AAA, prior blood clots, CHF,pacemaker due to sick sinus syndrome.   She had called the after hours [facility] line with concern for SOB. She was recommended to go to ED after she was heard to be breathing heavily, coughed, and had difficulty speaking.  Patient was admitted for Community-acquired pneumonia; has elevated BNP. CTA showed no PE but revealed likely multifocal pneumonia versus septic emboli versus neoplasm. Radiology recommended repeat CT chest in approximately 1 month to rule out malignancy.  Primary Care Provider is Nuala Alpha, DO. with [facility]. This office also provides transition of care.  PT evaluation note shows no follow-up PT recommended.  Will follow recommendations for disposition with inpatient TOC team and be made aware that [facility] CM following. Willnotify[facility] CM coordinators and the Embedded practice of disposition.  This patient will receive a post hospital call and will be evaluated for assessments and disease process educationif transitioning to home.   Of note, [facility] Care Management services does not replace or interfere with any services that are arranged by transition of care case management or social work.    For additional questions,please contact:  Itali Mckendry A. Tana Trefry, BSN, RN-BC [facility] Liaison Cell: [phone]

## 2018-12-11 NOTE — ED Notes (Signed)
Breakfast Ordered 

## 2018-12-12 ENCOUNTER — Ambulatory Visit: Payer: Self-pay

## 2018-12-12 LAB — LEGIONELLA PNEUMOPHILA SEROGP 1 UR AG: L. pneumophila Serogp 1 Ur Ag: NEGATIVE

## 2018-12-12 NOTE — Chronic Care Management (AMB) (Signed)
Care Management   Initial Visit Note  12/12/2018 Name: Deanna Spencer MRN: [id number] DOB: 18-Aug-1940    Assessment: Deanna Spencer is a 78 y.o. year old female who sees Nuala Alpha, DO for primary care. The care management team was consulted for assistance with care management and care coordination needs related to CHF and Atrial Fibrillation.   Review of patient status, including review of consultants reports, relevant laboratory and other test results, and collaboration with appropriate care team members and the patient's provider was performed as part of comprehensive patient evaluation and provision of care management services.    SDOH (Social Determinants of Health) screening performed today: None. See Care Plan for related entries.    Outpatient Encounter Medications as of 12/12/2018  Medication Sig Note  . acetaminophen (TYLENOL) 500 MG tablet Take 1,000 mg every 6 (six) hours as needed by mouth (pain).    . calcium citrate (CALCITRATE) 950 MG tablet Take 200 mg of elemental calcium by mouth daily.    . cefdinir (OMNICEF) 300 MG capsule Take 1 capsule (300 mg total) by mouth daily.   . cholecalciferol (VITAMIN D3) 25 MCG (1000 UT) tablet Take 1 tablet (1,000 Units total) by mouth daily.   . famotidine (PEPCID) 40 MG tablet Take 1 tablet (40 mg total) by mouth at bedtime.   . fesoterodine (TOVIAZ) 4 MG TB24 tablet Take 4 mg by mouth daily.   Marland Kitchen gabapentin (NEURONTIN) 300 MG capsule Please take 2 tablets three times daily starting in the morning (Patient taking differently: Take 600 mg by mouth 3 (three) times daily. )   . metoprolol tartrate (LOPRESSOR) 100 MG tablet Take 100 mg by mouth 2 (two) times daily.   . pantoprazole (PROTONIX) 40 MG tablet Take 2 tablets (80 mg total) by mouth daily.   Marland Kitchen rOPINIRole (REQUIP) 0.5 MG tablet Take 1 tablet (0.5 mg total) by mouth 3 (three) times daily. (Patient taking differently: Take 0.5-1 mg by mouth See admin instructions. Take 1 tablet  every evening and take 2 tablets at bedtime)   . sertraline (ZOLOFT) 50 MG tablet Take 1 tablet (50 mg total) by mouth daily.   . sucralfate (CARAFATE) 1 g tablet Take 1 tablet (1 g total) by mouth every 6 (six) hours.   Marland Kitchen azithromycin (ZITHROMAX) 500 MG tablet Take 1 tablet (500 mg total) by mouth once for 1 dose. (Patient not taking: Reported on 12/12/2018) 12/12/2018: Patient is waiting for the prescription to be filled advised her to have her daughter to call back up their today to make sure it is filled.   No facility-administered encounter medications on file as of 12/12/2018.    Goals Addressed            This Visit's Progress   . "I want to stay out of the hospital" (pt-stated)       Current Barriers:  Marland Kitchen Knowledge Deficits related to post discharge instructions following the IP admission 12/10/18--12/11/18 . Chronic Disease Management support and education needs related to discharge instructions, medication review and follow up appointment  Nurse Case Manager Clinical Goal(s):  Marland Kitchen Over the next 14 days, patient will verbalize understanding of plan for heart failure action plan  Interventions:  . Evaluation of current treatment plan related to post discharge instructions . Call pharmacy and pick up Zithromax as instructed . Reviewed medication with the patient . Reviewed upcoming appointment with Dr Garlan Fillers on 12/18/18 at  3:50 pm . Discussed signs and symptoms of CHF . Discussed  HF action Plan  Patient Self Care Activities:  . Patient verbalizes understanding of plan to HF action plan . Self administers medications as prescribed . Attends all scheduled provider appointments . Calls pharmacy for medication refills . Calls provider office for new concerns or questions  Initial goal documentation         Follow up plan:   The care management team will reach out to the patient again over the next 14 days.  The patient has been provided with contact information for the care  management team and has been advised to call with any health related questions or concerns.   Ms. Silla was given information about Care Management services today including:  1. Care Management services include personalized support from designated clinical staff supervised by a physician, including individualized plan of care and coordination with other care providers 2. 24/7 contact phone numbers for assistance for urgent and routine care needs. 3. The patient may stop Care Management services at any time (effective at the end of the month) by phone call to the office staff.  Patient agreed to services and verbal consent obtained.  Lazaro Arms RN, BSN, [facility] Care Management Coordinator [place] Phone: [phone] I Office: [phone] Fax: [phone]

## 2018-12-12 NOTE — Telephone Encounter (Signed)
I dont think so. Deseree Kennon Holter, CMA

## 2018-12-12 NOTE — Patient Instructions (Addendum)
Visit Information  Goals Addressed            This Visit's Progress   . "I want to stay out of the hospital" (pt-stated)       Current Barriers:  Marland Kitchen Knowledge Deficits related to post discharge instructions following the IP admission 12/10/18--12/11/18 . Chronic Disease Management support and education needs related to discharge instructions, medication review and follow up appointment  Nurse Case Manager Clinical Goal(s):  Marland Kitchen Over the next 14 days, patient will verbalize understanding of plan for heart failure action plan  Interventions:  . Evaluation of current treatment plan related to post discharge instructions . Call pharmacy and pick up Zithromax as instructed . Reviewed medication with the patient . Reviewed upcoming appointment with Dr Garlan Fillers on 12/18/18 at  3:50 pm . Discussed signs and symptoms of CHF . Discussed HF action Plan  Patient Self Care Activities:  . Patient verbalizes understanding of plan to HF action plan . Self administers medications as prescribed . Attends all scheduled provider appointments . Calls pharmacy for medication refills . Calls provider office for new concerns or questions  Initial goal documentation          Heart Failure Action Plan A heart failure action plan helps you understand what to do when you have symptoms of heart failure. Follow the plan that was created by you and your health care provider. Review your plan each time you visit your health care provider. Red zone These signs and symptoms mean you should get medical help right away:  You have trouble breathing when resting.  You have a dry cough that is getting worse.  You have swelling or pain in your legs or abdomen that is getting worse.  You suddenly gain more than 2-3 lb (0.9-1.4 kg) in a day, or more than 5 lb (2.3 kg) in one week. This amount may be more or less depending on your condition.  You have trouble staying awake or you feel confused.  You have chest  pain.  You do not have an appetite.  You pass out. If you experience any of these symptoms:  Call your local emergency services (911 in the [place] right away or seek help at the emergency department of the nearest hospital. Yellow zone These signs and symptoms mean your condition may be getting worse and you should make some changes:  You have trouble breathing when you are active or you need to sleep with extra pillows.  You have swelling in your legs or abdomen.  You gain 2-3 lb (0.9-1.4 kg) in one day, or 5 lb (2.3 kg) in one week. This amount may be more or less depending on your condition.  You get tired easily.  You have trouble sleeping.  You have a dry cough. If you experience any of these symptoms:  Contact your health care provider within the next day.  Your health care provider may adjust your medicines. Green zone These signs mean you are doing well and can continue what you are doing:  You do not have shortness of breath.  You have very little swelling or no new swelling.  Your weight is stable (no gain or loss).  You have a normal activity level.  You do not have chest pain or any other new symptoms. Follow these instructions at home:  Keep all follow-up visits as told by your health care provider. This is important. Where to find more information  American Heart Association: www.heart.org Summary  Follow the  action plan that was created by you and your health care provider.  Get help right away if you have any symptoms in the Red zone. This information is not intended to replace advice given to you by your health care provider. Make sure you discuss any questions you have with your health care provider. Document Released: 02/06/2016 Document Revised: 12/09/2016 Document Reviewed: 02/06/2016 Elsevier Patient Education  2020 [employer].  Ms. Penny was given information about Care Management services today including:  1. Care Management services  include personalized support from designated clinical staff supervised by her physician, including individualized plan of care and coordination with other care providers 2. 24/7 contact phone numbers for assistance for urgent and routine care needs. 3. The patient may stop CCM services at any time (effective at the end of the month) by phone call to the office staff.  Patient agreed to services and verbal consent obtained.   The patient verbalized understanding of instructions provided today and declined a print copy of patient instruction materials.   The care management team will reach out to the patient again over the next 14 days.  The patient has been provided with contact information for the care management team and has been advised to call with any health related questions or concerns.   Lazaro Arms RN, BSN, [facility] Care Management Coordinator [place] Phone: [phone] I Office: [phone] Fax: [phone]

## 2018-12-13 ENCOUNTER — Other Ambulatory Visit: Payer: Self-pay | Admitting: Family Medicine

## 2018-12-14 ENCOUNTER — Other Ambulatory Visit: Payer: Self-pay | Admitting: Pharmacist

## 2018-12-14 NOTE — Patient Outreach (Signed)
[facility]) Care Management  [place]   12/14/2018  Deanna Spencer 04-Aug-1940 [id number]  Reason for referral: Medication Assistance, Medication Reconciliation Post Discharge  Referral source: [facility] RN Current insurance: Unknown  Outreach:  Successful telephone call with patient.  HIPAA identifiers verified.   Subjective:  Patient is a 78 year old female with multiple medical conditions including but not limited to:  AAA, anemia, A fib, cardiac pacemake in situ, chronic right shoulder pain, CHF, depression, fatigue, GERD, osteoarthritis, seasonal allergies, and rheumatoid arthritis. Patient was recently hospitalized for pneumonia.   Objective: The ASCVD Risk score Mikey Bussing DC Jr., et al., 2013) failed to calculate for the following reasons:   The patient has a prior MI or stroke diagnosis  Lab Results  Component Value Date   CREATININE 1.11 (H) 12/11/2018   CREATININE 1.14 (H) 12/10/2018   CREATININE 0.68 08/01/2018    No results found for: HGBA1C  Lipid Panel     Component Value Date/Time   CHOL 114 05/05/2018 1845   TRIG 123 05/05/2018 1845   HDL 31 (L) 05/05/2018 1845   CHOLHDL 3.7 05/05/2018 1845   VLDL 25 05/05/2018 1845   LDLCALC 58 05/05/2018 1845    BP Readings from Last 3 Encounters:  12/11/18 130/67  10/30/18 (!) 175/78  09/19/18 128/80    Allergies  Allergen Reactions  . Demerol [Meperidine] Anaphylaxis  . 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 Sig Documenting Provider Last Dose Status Informant  acetaminophen (TYLENOL) 500 MG tablet [id number] Yes Take 1,000 mg every 6 (six) hours as needed by mouth (pain).  [provider] Taking Active Self  azithromycin (ZITHROMAX) 500 MG tablet  [id number]  Take 1 tablet (500 mg total) by mouth once for 1 dose. Martyn Malay, MD  Active   calcium citrate (CALCITRATE) 950 MG tablet [id number] Yes Take 200 mg of elemental calcium by mouth daily.  [provider] Taking Active Self  cefdinir (OMNICEF) 300 MG capsule [id number] Yes Take 1 capsule (300 mg total) by mouth daily. Martyn Malay, MD Taking Active   cholecalciferol (VITAMIN D3) 25 MCG (1000 UT) tablet [id number] Yes Take 1 tablet (1,000 Units total) by mouth daily. Martyn Malay, MD Taking Active   famotidine (PEPCID) 40 MG tablet [id number] Yes Take 1 tablet (40 mg total) by mouth at bedtime. Nuala Alpha, DO Taking Active Self  fesoterodine (TOVIAZ) 4 MG TB24 tablet [id number] Yes Take 4 mg by mouth daily. [provider] Taking Active Self  gabapentin (NEURONTIN) 300 MG capsule [id number] Yes Please take 2 tablets three times daily starting in the morning  Patient taking differently: Take 600 mg by mouth 3 (three) times daily.    Nuala Alpha, DO Taking Active Self  metoprolol tartrate (LOPRESSOR) 100 MG tablet [id number] Yes Take 100 mg by mouth 2 (two) times daily. [provider] Taking Active Self  pantoprazole (PROTONIX) 40 MG tablet [id number] Yes Take 2 tablets (80 mg total) by mouth daily. Martyn Malay, MD Taking Active   rOPINIRole (REQUIP) 0.5 MG tablet [id number] Yes Take 1 tablet (0.5 mg total) by mouth 3 (three) times daily.  Patient taking differently: Take 0.5-1 mg by mouth See admin instructions. Take 1 tablet every evening and take  2 tablets at bedtime   Nuala Alpha, DO Taking Active Self  sertraline (ZOLOFT) 50 MG tablet [id number] Yes Take 1 tablet (50 mg total) by mouth daily. Nuala Alpha, DO Taking Active Self  sucralfate (CARAFATE) 1 g tablet [id number] Yes Take 1 tablet (1 g total) by mouth every 6 (six) hours. Nuala Alpha, DO Taking Active Self          ASSESSMENT: Date Discharged from Hospital:  12/10/2018 Date Medication Reconciliation Performed: 12/14/2018  Medications:  New at Discharge: azithromycin (Zithromax) (completed) cefdinir (OMNICEF) cholecalciferol (VITAMIN D3)  Patient was recently discharged from hospital and all medications have been reviewed.  Drugs sorted by system:  Neurologic/Psychologic: Gabapentin, Ropinirole, sertraline,  Cardiovascular:  Metoprolol  Gastrointestinal: Famotidine, Pantoprazole, Sucralfate  Pain: Acetaminophen  Infectious Diseases: Cefdinir  Genitourinary: Fesoterodine  Vitamins/Minerals/Supplements: Calcium Citrate, Cholecalciferol    Medication Review Findings:  . Patient was confused with her pill packs   Medication Adherence Findings: Adherence Review  []  Excellent (no doses missed/week)     []  Good (no more than 1 dose missed/week)     []  Partial (2-3 doses missed/week) []  Poor (>3 doses missed/week)  Patient with good understanding of regimen and good understanding of indications.    Medication Assistance Findings:  Medication assistance needs identified: Toviaz. Patient's portion of the application was received but her provider's office does not allow shipments of   Extra Help:  Not eligible for Extra Help Low Income Subsidy based on reported income and assets  Additional medication assistance options reviewed with patient as warranted:  No other options identified  Plan: . Will follow-up in 3-4 weeks.Elayne Guerin, PharmD, [place] Clinical Pharmacist [phone]

## 2018-12-15 LAB — CULTURE, BLOOD (ROUTINE X 2)
Culture: NO GROWTH
Culture: NO GROWTH
Special Requests: ADEQUATE

## 2018-12-17 ENCOUNTER — Ambulatory Visit (INDEPENDENT_AMBULATORY_CARE_PROVIDER_SITE_OTHER): Payer: Medicare Other | Admitting: Family Medicine

## 2018-12-17 ENCOUNTER — Encounter: Payer: Self-pay | Admitting: Family Medicine

## 2018-12-17 ENCOUNTER — Other Ambulatory Visit: Payer: Self-pay

## 2018-12-17 ENCOUNTER — Other Ambulatory Visit: Payer: Self-pay | Admitting: Family Medicine

## 2018-12-17 VITALS — BP 132/74 | HR 73 | Wt 121.0 lb

## 2018-12-17 DIAGNOSIS — E876 Hypokalemia: Secondary | ICD-10-CM | POA: Diagnosis not present

## 2018-12-17 DIAGNOSIS — J189 Pneumonia, unspecified organism: Secondary | ICD-10-CM

## 2018-12-17 DIAGNOSIS — M069 Rheumatoid arthritis, unspecified: Secondary | ICD-10-CM | POA: Diagnosis not present

## 2018-12-17 DIAGNOSIS — D649 Anemia, unspecified: Secondary | ICD-10-CM | POA: Diagnosis not present

## 2018-12-17 MED ORDER — DIGOXIN 62.5 MCG PO TABS: 2.0000 | ORAL_TABLET | Freq: Every day | ORAL | 0 refills | Status: DC

## 2018-12-17 NOTE — Progress Notes (Signed)
Patient requesting refill for digoxin. This was started by Cardiology during a hospital admission for her heart failure. She has been told multiple times to follow up with Cardiology as she needs to be followed by them and also they need to refill this prescription.  This will be the last time I will refill this prescription for her.

## 2018-12-17 NOTE — Assessment & Plan Note (Signed)
Referral sent to rheumatology.

## 2018-12-17 NOTE — Assessment & Plan Note (Addendum)
Much improved after finishing antibiotic course.  No wheezes or rales on exam, normal work of breathing and appropriately saturated on room air.  Will obtain repeat CT chest in about 2-3 weeks to follow-up original findings and rule out malignancy.

## 2018-12-17 NOTE — Progress Notes (Signed)
  Subjective:   Patient ID: Deanna Spencer    DOB: 1940/02/19, 78 y.o. female   MRN: [id number]  Deanna Spencer is a 78 y.o. female with a history of sick sinus syndrome with pacemaker, A. fib, AAA, systolic heart failure, GERD, hearing loss, RA, spastic bladder, parasomnia, protein calorie malnutrition, depression, anastomotic ulcer s/p gastric bypass, recurrent upper GI bleed, restless leg syndrome here for   Hospital follow-up -Hospitalized 11/30-12/1 for multifocal pneumonia versus neoplasm versus septic emboli visualized on CTA.  Improved with azithromycin and ceftriaxone.  Radiology recommends repeat CT chest in 1 month to rule out malignancy. - D/ced with 5 days remaining of cefdinir -Needs repeat BMP to follow-up hypokalemia (K 3.1) and AKI (Cr 1.11 with normal baseline) -Needs repeat CBC to follow-up anemia, hemoglobin on discharge 10.9. -She is feeling better, only slightly short of breath with occasional coughing up phlegm.  Denies fevers.  She was able to finish her antibiotic.  Still feeling slightly tired.   Rheumatoid arthritis Has not yet been able to establish with rheumatology since moving from [place].  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  He is not on steroids or immunosuppressive agents.  Review of Systems:  Per HPI.  Medications and smoking status reviewed.  Objective:   BP 132/74   Pulse 73   Wt 121 lb (54.9 kg)   SpO2 94%   BMI 24.44 kg/m  Vitals and nursing note reviewed.  General: Frail elderly female, in no acute distress with non-toxic appearance HEENT: normocephalic, atraumatic, moist mucous membranes Neck: supple, non-tender without lymphadenopathy CV: regular rate and rhythm, systolic murmur  Lungs: clear to auscultation bilaterally with normal work of breathing.  No wheezes or rales.  Normal work of breathing and appropriately saturated on room air.  Assessment & Plan:   Community acquired pneumonia Much improved after  finishing antibiotic course.  No wheezes or rales on exam, normal work of breathing and appropriately saturated on room air.  Will obtain repeat CT chest in about 2-3 weeks to follow-up original findings and rule out malignancy.  Rheumatoid arthritis involving both hands [facility]) Referral sent to rheumatology.  Orders Placed This Encounter  Procedures  . CT Chest W Contrast    Epic order Ins-mcr/ united american Wt 121/ no diab/ no kid or liv dz/ no htn or dialysis/ no needs/ nka ivp/ fwp and pt Covid?=no    Standing Status:   Future    Standing Expiration Date:   02/17/2020    Order Specific Question:   If indicated for the ordered procedure, I authorize the administration of contrast media per Radiology protocol    Answer:   Yes    Order Specific Question:   Preferred imaging location?    Answer:   [facility]    Order Specific Question:   Radiology Contrast Protocol - do NOT remove file path    Answer:   [email or web address]  . CBC  . Basic Metabolic Panel  . Ambulatory referral to Rheumatology    Referral Priority:   Routine    Referral Type:   Consultation    Referral Reason:   Specialty Services Required    Requested Specialty:   Rheumatology    Number of Visits Requested:   1   No orders of the defined types were placed in this encounter.   Rory Percy, DO PGY-3, [facility] 12/17/2018 4:49 PM

## 2018-12-17 NOTE — Patient Instructions (Addendum)
It was great to see you!  Our plans for today:  - Your lungs sound great today!  I am glad you are able to finish your antibiotics. - We are checking some labs today, we will call you or send you a letter with these results. - We are scheduling a follow-up CT scan of your chest to follow-up on your pneumonia. - I sent a message to your [facility] pharmacist to see how you could best get your Toviaz. - Please keep your appointment in the geriatric clinic with Dr. McDiarmid. - I sent a referral to rheumatology for your rheumatoid arthritis and Raynolds.  You should be receiving a phone call within the next couple of weeks to set up with an appointment.  Take care and seek immediate care sooner if you develop any concerns.   Dr. [facility]

## 2018-12-18 ENCOUNTER — Ambulatory Visit: Payer: Medicare Other | Admitting: Family Medicine

## 2018-12-18 ENCOUNTER — Encounter: Payer: Self-pay | Admitting: Family Medicine

## 2018-12-18 LAB — CBC
Hematocrit: 33.6 % — ABNORMAL LOW (ref 34.0–46.6)
Hemoglobin: 11.2 g/dL (ref 11.1–15.9)
MCH: 29.6 pg (ref 26.6–33.0)
MCHC: 33.3 g/dL (ref 31.5–35.7)
MCV: 89 fL (ref 79–97)
Platelets: 431 10*3/uL (ref 150–450)
RBC: 3.79 x10E6/uL (ref 3.77–5.28)
RDW: 13.1 % (ref 11.7–15.4)
WBC: 5.9 10*3/uL (ref 3.4–10.8)

## 2018-12-18 LAB — BASIC METABOLIC PANEL
BUN/Creatinine Ratio: 15 (ref 12–28)
BUN: 17 mg/dL (ref 8–27)
CO2: 23 mmol/L (ref 20–29)
Calcium: 9.2 mg/dL (ref 8.7–10.3)
Chloride: 103 mmol/L (ref 96–106)
Creatinine, Ser: 1.15 mg/dL — ABNORMAL HIGH (ref 0.57–1.00)
GFR calc Af Amer: 53 mL/min/{1.73_m2} — ABNORMAL LOW (ref >59)
GFR calc non Af Amer: 46 mL/min/{1.73_m2} — ABNORMAL LOW (ref >59)
Glucose: 93 mg/dL (ref 65–99)
Potassium: 4.3 mmol/L (ref 3.5–5.2)
Sodium: 143 mmol/L (ref 134–144)

## 2018-12-31 ENCOUNTER — Ambulatory Visit: Payer: Medicare Other

## 2019-01-01 ENCOUNTER — Ambulatory Visit: Payer: Self-pay | Admitting: Pharmacist

## 2019-01-14 ENCOUNTER — Other Ambulatory Visit: Payer: Self-pay

## 2019-01-14 ENCOUNTER — Encounter: Payer: Self-pay | Admitting: Family Medicine

## 2019-01-14 ENCOUNTER — Ambulatory Visit (INDEPENDENT_AMBULATORY_CARE_PROVIDER_SITE_OTHER): Payer: Medicare Other | Admitting: Family Medicine

## 2019-01-14 VITALS — BP 132/82 | HR 68 | Wt 117.2 lb

## 2019-01-14 DIAGNOSIS — Z8739 Personal history of other diseases of the musculoskeletal system and connective tissue: Secondary | ICD-10-CM

## 2019-01-14 DIAGNOSIS — Z23 Encounter for immunization: Secondary | ICD-10-CM | POA: Diagnosis present

## 2019-01-14 DIAGNOSIS — Z0000 Encounter for general adult medical examination without abnormal findings: Secondary | ICD-10-CM | POA: Diagnosis present

## 2019-01-14 DIAGNOSIS — R1110 Vomiting, unspecified: Secondary | ICD-10-CM | POA: Insufficient documentation

## 2019-01-14 DIAGNOSIS — D649 Anemia, unspecified: Secondary | ICD-10-CM

## 2019-01-14 DIAGNOSIS — Z780 Asymptomatic menopausal state: Secondary | ICD-10-CM | POA: Diagnosis not present

## 2019-01-14 DIAGNOSIS — E559 Vitamin D deficiency, unspecified: Secondary | ICD-10-CM | POA: Diagnosis not present

## 2019-01-14 DIAGNOSIS — R112 Nausea with vomiting, unspecified: Secondary | ICD-10-CM

## 2019-01-14 MED ORDER — SOLIFENACIN SUCCINATE 5 MG PO TABS: 5.0000 mg | ORAL_TABLET | Freq: Every day | ORAL | 3 refills | Status: DC

## 2019-01-14 NOTE — Progress Notes (Signed)
Subjective: Chief Complaint  Patient presents with  . Follow-up   HPI: Deanna Spencer is a 79 y.o. presenting to clinic today to discuss the following:  Nausea and Vomiting Patient with complicated medical history of multiple gastric bypass surgeries, anemia, GERD, and gout presenting for increased nausea and multiple episodes of vomiting after meals. She states she cannot remember when she started vomiting more but it has slowly gotten worse over the last few weeks. She says over the past 7 days she has vomiting 21 times. It is mostly after eating and if she skips a meal she does not get nauseated and does not vomit. No blood or bile in the vomit. She denies fever, chills, abdominal pain, has been having normal bowel movements with slight constipation. She also states her fingernails are "cracking" which is concerning her.     ROS noted in HPI.    Social History   Tobacco Use  Smoking Status Never Smoker  Smokeless Tobacco Never Used    Objective: BP 132/82   Pulse 68   Wt 117 lb 3.2 oz (53.2 kg)   SpO2 99%   BMI 23.67 kg/m  Vitals and nursing notes reviewed  Physical Exam Gen: Alert and Oriented x 3, NAD CV: RRR, no murmurs, normal S1, S2 split Resp: CTAB, no wheezing, rales, or rhonchi, comfortable work of breathing Abd: non-distended, non-tender, soft, +bs in all four quadrants Ext: no clubbing, cyanosis, or edema Skin: warm, dry, intact, no rashes  Assessment/Plan:  Vomiting Differential remains broad. Patient has lost 4lbs since last recorded weight in our office. Overall, this is a puzzling presentation as she appears well and states she feels good. She states she just vomits more after eating. However, no abdominal pain. - Obtaining blood work - Patient is established with GI and instructed patient to inform them of her symptoms and to get seen this week. I am concerned she may have an obstruction of her gastric bypass or achalasia is also  possible.   PATIENT EDUCATION PROVIDED: See AVS    Diagnosis and plan along with any newly prescribed medication(s) were discussed in detail with this patient today. The patient verbalized understanding and agreed with the plan. Patient advised if symptoms worsen return to clinic or ER.   Health Maintainance: PPSV-23   Orders Placed This Encounter  Procedures  . DG Bone Density    Standing Status:   Future    Standing Expiration Date:   04/14/2019    Order Specific Question:   Reason for Exam (SYMPTOM  OR DIAGNOSIS REQUIRED)    Answer:   healthcare maintance    Order Specific Question:   Preferred imaging location?    Answer:   [facility]    Order Specific Question:   Release to patient    Answer:   Immediate  . Pneumococcal polysaccharide vaccine 23-valent greater than or equal to 2yo subcutaneous/IM  . VITAMIN D 25 Hydroxy (Vit-D Deficiency, Fractures)    Order Specific Question:   Release to patient    Answer:   Immediate  . Iron, TIBC and Ferritin Panel  . Reticulocytes  . CBC  . Uric Acid    Meds ordered this encounter  Medications  . solifenacin (VESICARE) 5 MG tablet    Sig: Take 1 tablet (5 mg total) by mouth daily.    Dispense:  30 tablet    Refill:  [place], DO 01/14/2019, 4:05 PM PGY-3 [facility]

## 2019-01-14 NOTE — Assessment & Plan Note (Signed)
Differential remains broad. Patient has lost 4lbs since last recorded weight in our office. Overall, this is a puzzling presentation as she appears well and states she feels good. She states she just vomits more after eating. However, no abdominal pain. - Obtaining blood work - Patient is established with GI and instructed patient to inform them of her symptoms and to get seen this week. I am concerned she may have an obstruction of her gastric bypass or achalasia is also possible.

## 2019-01-14 NOTE — Patient Instructions (Signed)
It was great to see you today! Thank you for letting me participate in your care!  Today, we discussed your nausea and vomiting. I highly recommend you contact your Gastroenterologist and let them know you need to be seen for this issue. Please call them this week and get an appointment as soon as you can.  Be well, Harolyn Rutherford, DO PGY-3, [facility]

## 2019-01-15 ENCOUNTER — Encounter: Payer: Self-pay | Admitting: Family Medicine

## 2019-01-15 ENCOUNTER — Ambulatory Visit: Payer: Medicare Other

## 2019-01-15 ENCOUNTER — Other Ambulatory Visit: Payer: Self-pay | Admitting: Pharmacist

## 2019-01-15 LAB — CBC
Hematocrit: 37.7 % (ref 34.0–46.6)
Hemoglobin: 12.2 g/dL (ref 11.1–15.9)
MCH: 29.8 pg (ref 26.6–33.0)
MCHC: 32.4 g/dL (ref 31.5–35.7)
MCV: 92 fL (ref 79–97)
Platelets: 370 10*3/uL (ref 150–450)
RBC: 4.09 x10E6/uL (ref 3.77–5.28)
RDW: 13.2 % (ref 11.7–15.4)
WBC: 8 10*3/uL (ref 3.4–10.8)

## 2019-01-15 LAB — IRON,TIBC AND FERRITIN PANEL
Ferritin: 322 ng/mL — ABNORMAL HIGH (ref 15–150)
Iron Saturation: 13 % — ABNORMAL LOW (ref 15–55)
Iron: 34 ug/dL (ref 27–139)
Total Iron Binding Capacity: 262 ug/dL (ref 250–450)
UIBC: 228 ug/dL (ref 118–369)

## 2019-01-15 LAB — VITAMIN D 25 HYDROXY (VIT D DEFICIENCY, FRACTURES): Vit D, 25-Hydroxy: 28 ng/mL — ABNORMAL LOW (ref 30.0–100.0)

## 2019-01-15 LAB — RETICULOCYTES: Retic Ct Pct: 1 % (ref 0.6–2.6)

## 2019-01-15 LAB — URIC ACID: Uric Acid: 4.4 mg/dL (ref 3.1–7.9)

## 2019-01-15 NOTE — Chronic Care Management (AMB) (Signed)
Care Management   Follow Up Note   01/15/2019 Name: Deanna Spencer MRN: [id number] DOB: 12-14-1940  Referred by: Nuala Alpha, DO Reason for referral : Care Coordination (Care Management RNCM CHF/ A-FIB )   Deanna Spencer is a 79 y.o. year old female who is a primary care patient of Nuala Alpha, DO. The care management team was consulted for assistance with care management and care coordination needs.    Review of patient status, including review of consultants reports, relevant laboratory and other test results, and collaboration with appropriate care team members and the patient's provider was performed as part of comprehensive patient evaluation and provision of chronic care management services.    SDOH (Social Determinants of Health) screening performed today: None. See Care Plan for related entries.   Advanced Directives: See Care Plan and Vynca application for related entries.   Goals Addressed            This Visit's Progress   . "I have been having an issue with nausea and vomiting for a long time but it has gotten worse." (pt-stated)       Current Barriers:  . Chronic Disease Management support, education, and care coordination needs related to  nausea and vomiting  Clinical Goal(s) related to  nausea and vomiting :  Over the next 30 days, patient will:  . Work with the care management team to address educational, disease management, and care coordination needs  . Call provider office for new or worsened signs and symptoms  . Call care management team with questions or concerns . Verbalize basic understanding of patient centered plan of care established today  Interventions related to  nausea and vomiting :  . Evaluation of current treatment plans and patient's adherence to plan as established by provider . Assessed patient understanding of disease states . Assessed patient's education and care coordination needs . Provided disease specific education to patient    . Advised the patient to call her gastroenterologist today as the doctor stated at her visit on 01-14-19.  Patient Self Care Activities related to  nausea and vomiting :  . Patient is unable to independently self-manage chronic health conditions  Initial goal documentation     . "I want to stay out of the hospital" (pt-stated)       Current Barriers:  Deanna Spencer Knowledge Deficits related to post discharge instructions following the IP admission 12/10/18--12/11/18 . Chronic Disease Management support and education needs related to discharge instructions, medication review and follow up appointment  Nurse Case Manager Clinical Goal(s):  Deanna Spencer Over the next 14 days, patient will verbalize understanding of plan for heart failure action plan  Interventions:  . Evaluation of current treatment plan related to post discharge instructions . Call pharmacy and pick up Zithromax as instructed . Reviewed medication with the patient . Reviewed upcoming appointment with Dr Garlan Fillers on 12/18/18 at  3:50 pm . Discussed signs and symptoms of CHF . Discussed HF action Plan  01/15/19 . Reviewed signs and symptoms of CHF no issues . Discussed HF action Plan . Reviewed medications . Patient to monitor sodium in her diet . Reviewed medications . Patient weighed while on the phone 116 lbs . Patient has a pacemaker check on 01/17/19 . Spoke with Denyse Amass Pharm D who is scheduled to call the patient today and will collaborate with Lottie Dawson Pharm D. Re:  medications.      Patient Self Care Activities:  . Patient verbalizes understanding of plan to  HF action plan . Self administers medications as prescribed . Attends all scheduled provider appointments . Calls pharmacy for medication refills . Calls provider office for new concerns or questions  Please see past updates related to this goal by clicking on the "Past Updates" button in the selected goal          Face to Face appointment with care management  team member scheduled for: 01/29/19 The patient has been provided with contact information for the care management team and has been advised to call with any health related questions or concerns.   Lazaro Arms RN, BSN, [facility] Care Management Coordinator [place] Phone: [phone] Office: [phone] Fax: [phone]

## 2019-01-15 NOTE — Patient Outreach (Signed)
[place] [facility]) Care Management  01/15/2019  Deanna Spencer 07-28-1940 [id number]  Patient was called to follow up on medication adherence and assistance. HIPAA identifiers were obtained.   Patient is a 79 year old female with multiple medical conditions including but not limited to:  AAA, Atrial Fibrillation, pacemaker, CHF, depression, GERD,  Peripheral neuropathy, osteoarthritis, and seasonal allergies.    She has been getting her medications through [facility] in pill packs.  Patient said digoxin was filled at the local pharmacy and had a copay of $74. She reported not picking it up due to cost. Since it was filled locally, it was not in her pill packs.    [facility]. The Pharmacy Technician said metoprolol 100mg  was filled at their pharmacy 11/07/2018 for a 90 day supply and was also not in the patient's pill packs.    Digoxin 0.625 mg was called in for the patient and the pharmacy reported a copay of around $74 for a 90 day supply. Patient was instructed to get a 30 day supply so that her future refills can be sent to [facility].  [facility] was called. Spoke with a Education administrator who confirmed Solfenacin was filled 01/13/2018 and will be in the patient's next pill packs.The copay for Solfenacin was reported as $0.  This will mean we can stop the [place] patient assistance process.   [facility] pharmacy stated the patient needs new refills on the following medications: Famotidine, Ropinirole, Pantoprazole, Sertraline, Gabapentin, and Calcium Citrate. (I spoke with [place] Pharmacist, Lottie Dawson, PharmD who said she would work on the refills with the patient's provider).   Reviewed medications with patient and [facility] via telephone: Medications Reviewed Today    Reviewed by Elayne Guerin, [facility] (Pharmacist) on 01/15/19 at 2121  Med List Status: <None>  Medication Order Taking? Sig Documenting Provider Last Dose Status  Informant  acetaminophen (TYLENOL) 500 MG tablet [id number] Yes Take 1,000 mg every 6 (six) hours as needed by mouth (pain).  [provider] Taking Active Self  calcium citrate (CALCITRATE) 950 MG tablet [id number] Yes Take 200 mg of elemental calcium by mouth daily.  [provider] Taking Active Self  cholecalciferol (VITAMIN D3) 25 MCG (1000 UT) tablet [id number] Yes Take 1 tablet (1,000 Units total) by mouth daily. Martyn Malay, MD Taking Active   Digoxin 62.5 MCG TABS [id number] No Take 2 tablets by mouth daily.  Patient not taking: Reported on 01/15/2019   Nuala Alpha, DO Not Taking Active   famotidine (PEPCID) 40 MG tablet [id number] Yes Take 1 tablet (40 mg total) by mouth at bedtime. Nuala Alpha, DO Taking Active Self  gabapentin (NEURONTIN) 300 MG capsule [id number] Yes Please take 2 tablets three times daily starting in the morning  Patient taking differently: Take 600 mg by mouth 3 (three) times daily.    Nuala Alpha, DO Taking Active Self  metoprolol tartrate (LOPRESSOR) 100 MG tablet [id number] Yes Take 100 mg by mouth 2 (two) times daily. [provider] Taking Active Self  pantoprazole (PROTONIX) 40 MG tablet [id number] Yes Take 2 tablets (80 mg total) by mouth daily. Martyn Malay, MD Taking Active   rOPINIRole (REQUIP) 0.5 MG tablet [id number] Yes Take 1 tablet (0.5 mg total) by mouth 3 (three) times daily.  Patient taking differently: Take 0.5-1 mg by mouth See admin instructions. Take 1 tablet every evening and take 2 tablets at bedtime  Nuala Alpha, DO Taking Active Self  sertraline (ZOLOFT) 50 MG tablet [id number] Yes Take 1 tablet (50 mg total) by mouth daily. Nuala Alpha, DO Taking Active Self  solifenacin (VESICARE) 5 MG tablet [id number] Yes Take 1 tablet (5 mg total) by mouth daily. Nuala Alpha, DO Taking Active   sucralfate (CARAFATE) 1 g tablet [id number] Yes Take 1 tablet (1 g total) by mouth every 6 (six) hours.  Nuala Alpha, DO Taking Active Self          Plan: Follow up with patient in 3-4 weeks. Lottie Dawson, PharmD will begin to work with the patient as she is embedded in the PCP office.  Elayne Guerin, PharmD, [place] Clinical Pharmacist [phone]

## 2019-01-15 NOTE — Progress Notes (Signed)
Low Vit D level, patient already taking Vit D supplementation. Other labs normal, sending letter.

## 2019-01-15 NOTE — Patient Instructions (Signed)
Visit Information  Goals Addressed            This Visit's Progress   . "I have been having an issue with nausea and vomiting for a long time but it has gotten worse." (pt-stated)       Current Barriers:  . Chronic Disease Management support, education, and care coordination needs related to  nausea and vomiting  Clinical Goal(s) related to  nausea and vomiting :  Over the next 30 days, patient will:  . Work with the care management team to address educational, disease management, and care coordination needs  . Call provider office for new or worsened signs and symptoms  . Call care management team with questions or concerns . Verbalize basic understanding of patient centered plan of care established today  Interventions related to  nausea and vomiting :  . Evaluation of current treatment plans and patient's adherence to plan as established by provider . Assessed patient understanding of disease states . Assessed patient's education and care coordination needs . Provided disease specific education to patient  . Advised the patient to call her gastroenterologist today as the doctor stated at her visit on 01-14-19.  Patient Self Care Activities related to  nausea and vomiting :  . Patient is unable to independently self-manage chronic health conditions  Initial goal documentation     . "I want to stay out of the hospital" (pt-stated)       Current Barriers:  Marland Kitchen Knowledge Deficits related to post discharge instructions following the IP admission 12/10/18--12/11/18 . Chronic Disease Management support and education needs related to discharge instructions, medication review and follow up appointment  Nurse Case Manager Clinical Goal(s):  Marland Kitchen Over the next 14 days, patient will verbalize understanding of plan for heart failure action plan  Interventions:  . Evaluation of current treatment plan related to post discharge instructions . Call pharmacy and pick up Zithromax as  instructed . Reviewed medication with the patient . Reviewed upcoming appointment with Dr Garlan Fillers on 12/18/18 at  3:50 pm . Discussed signs and symptoms of CHF . Discussed HF action Plan  01/15/19 . Reviewed signs and symptoms of CHF no issues . Discussed HF action Plan . Reviewed medications . Patient to monitor sodium in her diet . Reviewed medications . Patient weighed while on the phone 116 lbs . Patient has a pacemaker check on 01/17/19 . Spoke with Denyse Amass Pharm D who is scheduled to call the patient today and will collaborate with Lottie Dawson Pharm D. Re:  medications.      Patient Self Care Activities:  . Patient verbalizes understanding of plan to HF action plan . Self administers medications as prescribed . Attends all scheduled provider appointments . Calls pharmacy for medication refills . Calls provider office for new concerns or questions  Please see past updates related to this goal by clicking on the "Past Updates" button in the selected goal         Ms. Salih was given information about Care Management services today including:  1. Care Management services include personalized support from designated clinical staff supervised by her physician, including individualized plan of care and coordination with other care providers 2. 24/7 contact phone numbers for assistance for urgent and routine care needs. 3. The patient may stop CCM services at any time (effective at the end of the month) by phone call to the office staff.  Patient agreed to services and verbal consent obtained.   The patient verbalized understanding  of instructions provided today and declined a print copy of patient instruction materials.   Telephone follow up appointment with care management team member scheduled for:01/29/19 The patient has been provided with contact information for the care management team and has been advised to call with any health related questions or concerns.   Lazaro Arms RN, BSN, [facility] Care Management Coordinator [place] Phone: [phone] Office: [phone] Fax: [phone]

## 2019-01-16 ENCOUNTER — Telehealth: Payer: Self-pay

## 2019-01-16 ENCOUNTER — Other Ambulatory Visit: Payer: Self-pay | Admitting: Family Medicine

## 2019-01-16 DIAGNOSIS — I5022 Chronic systolic (congestive) heart failure: Secondary | ICD-10-CM

## 2019-01-16 MED ORDER — DIGOXIN 62.5 MCG PO TABS: 2.0000 | ORAL_TABLET | Freq: Every day | ORAL | 2 refills | Status: DC

## 2019-01-16 NOTE — Telephone Encounter (Signed)
Pt calling nurse line regarding issues with vision. Patient stated that her vision has been worsening and that she has been seeing an opthamologist (Dr. Lenox Ahr) for double vision and strabismus. Pt stated that opthamologist needed an order from PCP for further testing. Opthamologist office called and LVM to return call to determine what was needed on our end.   Pt also states that she is having a pain management issues with her RA due to the cold weather. States that she would like a refill on tramadol 50mg , however, this medication is not on her medication list.   Please advise  To PCP  Talbot Grumbling, RN

## 2019-01-17 ENCOUNTER — Other Ambulatory Visit: Payer: Self-pay

## 2019-01-17 ENCOUNTER — Ambulatory Visit (INDEPENDENT_AMBULATORY_CARE_PROVIDER_SITE_OTHER): Payer: Medicare Other | Admitting: Cardiology

## 2019-01-17 ENCOUNTER — Encounter: Payer: Self-pay | Admitting: Cardiology

## 2019-01-17 VITALS — BP 151/78 | HR 75 | Temp 98.3°F | Ht 59.0 in | Wt 117.3 lb

## 2019-01-17 DIAGNOSIS — I340 Nonrheumatic mitral (valve) insufficiency: Secondary | ICD-10-CM | POA: Diagnosis not present

## 2019-01-17 DIAGNOSIS — I4821 Permanent atrial fibrillation: Secondary | ICD-10-CM | POA: Diagnosis not present

## 2019-01-17 MED ORDER — FUROSEMIDE 20 MG PO TABS: 20.0000 mg | ORAL_TABLET | ORAL | 3 refills | Status: AC | PRN

## 2019-01-17 NOTE — Progress Notes (Signed)
Follow up visit  Subjective:   Deanna Spencer, female    DOB: 08-12-1940, 79 y.o.   MRN: [id number]  Chief Complaint  Patient presents with  . Atrial Fibrillation    pacemaker check      HPI  79 y.o. Caucasian female with complex medical history including permanent atrial fibrillation, s/p Watchman LAAC device, h/o recurrent GI bleeding with ulcers following gastric bypass surgery, h/o multilple blood transfusions, exploratory laporotomy with revision of gastrojejunostomy, esophagogastroscopy (11/2016), rheumatoid arthritis, multiple orthopedic issues.   Patient was admitted to [facility] in November 2020 with shortness of breath, was treated for community-acquired pneumonia. Her BNP was elevated at 1600. HS Trop mildly elevated at 26. She has occasional shortness of breath on walking, but denies and leg edema, orthopnea. Her biggest complaint at this time is abdominal pain, nausea and vomiting. She has upcoming follow up with GI for the same.    Current Outpatient Medications on File Prior to Visit  Medication Sig Dispense Refill  . acetaminophen (TYLENOL) 500 MG tablet Take 1,000 mg every 6 (six) hours as needed by mouth (pain).     . calcium citrate (CALCITRATE) 950 MG tablet Take 200 mg of elemental calcium by mouth daily.     . cholecalciferol (VITAMIN D3) 25 MCG (1000 UT) tablet Take 1 tablet (1,000 Units total) by mouth daily. 90 tablet 0  . Digoxin 62.5 MCG TABS Take 2 tablets by mouth daily. 60 tablet 2  . famotidine (PEPCID) 40 MG tablet Take 1 tablet (40 mg total) by mouth at bedtime. 90 tablet 3  . gabapentin (NEURONTIN) 300 MG capsule Please take 2 tablets three times daily starting in the morning (Patient taking differently: Take 600 mg by mouth 3 (three) times daily. ) 90 capsule 5  . metoprolol tartrate (LOPRESSOR) 100 MG tablet Take 100 mg by mouth 2 (two) times daily.    . pantoprazole (PROTONIX) 40 MG tablet Take 2 tablets (80 mg total) by mouth daily. 90  tablet 0  . rOPINIRole (REQUIP) 0.5 MG tablet Take 1 tablet (0.5 mg total) by mouth 3 (three) times daily. (Patient taking differently: Take 0.5-1 mg by mouth See admin instructions. Take 1 tablet every evening and take 2 tablets at bedtime) 90 tablet 2  . sertraline (ZOLOFT) 50 MG tablet Take 1 tablet (50 mg total) by mouth daily. 30 tablet 3  . solifenacin (VESICARE) 5 MG tablet Take 1 tablet (5 mg total) by mouth daily. 30 tablet 3  . sucralfate (CARAFATE) 1 g tablet Take 1 tablet (1 g total) by mouth every 6 (six) hours. 360 tablet 3   No current facility-administered medications on file prior to visit.    Cardiovascular & other pertient studies:  EKG 01/16/2018: Atrial fibrillation 80 bpm. Right bundle branch block.  Possible old inferior infarct.    CT Chest 12/10/2018: 1. No demonstrable pulmonary embolus. No thoracic aortic aneurysm. Contrast bolus in the aorta is not sufficient to allow exclusion of dissection as a potential differential consideration by radiography. There is aortic atherosclerosis as well as foci of great vessel and coronary artery calcification. There is cardiomegaly. Left atrial appendage occlusion device present. Pacemaker leads attached to right atrium and right ventricle. 2. Widespread airspace opacity on the right, most severe in the right lower lobe. This airspace opacity is felt to be indicative of pneumonia. There are multiple more nodular opacities throughout the lungs bilaterally which could have infectious etiology or possibly represent noncavitated septic emboli. It should  be noted that underlying neoplasm must be of concern, however. In this regard, advise follow-up CT in 3-4 weeks after treatment for pneumonia. It should be noted that atypical organism pneumonia is a differential consideration in this circumstance. There is a degree of underlying centrilobular emphysematous change. 3. Areas of adenopathy may be secondary to the extensive  parenchymal lung changes.  4. Reflux of contrast into the inferior vena cava and hepatic veins may be indicative of increased right heart pressure. 5.  Evidence of previous gastric bypass procedure.  Echocardiogram 09/18/2018: Left ventricle cavity is normal in size. Moderate concentric hypertrophy of the left ventricle. Mild global hypokinesis with mildly depressed LV systolic function with visual EF 40-45%.  Left atrial cavity is severely dilated at 88 cm2/m2.  S/p LAA closure.  Trace aortic regurgitation. Mild mitral valve leaflet thickening. Moderate to severe mitral regurgitation. Wall-impinging MR jet color flow area. Mild tricuspid regurgitation. Estimated pulmonary artery systolic pressure is 29 mmHg. IVC is dilated with blunted respiratory response. Estimated RA pressure 10-15 mmHg.  EKG 08/17/2018: Atrial fibrillation Ventricular paced rhythm  Nuclear stress test 02/03/2017:   The study is normal.  This is a low risk study.  The patient was in atrial fibrillation so gating was not possible. Therefore, we were not able to obtain any information regarding left ventricular function.   Recent labs: 01/14/2019: H/H 12/37. MCV 92. Platelets 370  12/17/2018: Glucose 93, BUN/Cr 17/1.15. EGFR 46. Na/K 143/4.3. Rest of the CMP normal  07/2018: TSH normal  04/2018: Chol 114, TG 123, HDL 31, LDL 58   Review of Systems  Cardiovascular: Negative for chest pain, dyspnea on exertion (Occasional), leg swelling, palpitations and syncope.  Musculoskeletal: Positive for joint pain.  Gastrointestinal: Positive for nausea and vomiting.        Vitals:   01/17/19 1109  BP: (!) 151/78  Pulse: 75  Temp: 98.3 F (36.8 C)  SpO2: 94%    Body mass index is 23.69 kg/m. Filed Weights   01/17/19 1109  Weight: 117 lb 4.8 oz (53.2 kg)     Objective:   Physical Exam  Constitutional: She appears well-developed and well-nourished.  Neck: No JVD present.  Cardiovascular: Normal  rate and intact distal pulses. An irregularly irregular rhythm present.  Murmur heard. High-pitched blowing holosystolic murmur is present with a grade of 3/6 at the apex. Pulmonary/Chest: Effort normal and breath sounds normal. She has no wheezes. She has no rales.  Musculoskeletal:        General: No edema.  Nursing note and vitals reviewed.         Assessment & Recommendations:   79 y.o. Caucasian female with severe mitral regurgitation, permanent atrial fibrillation, s/p Watchman LAAC device, h/o recurrent GI bleeding with ulcers following gastric bypass surgery, h/o multilple blood transfusions, exploratory laporotomy with revision of gastrojejunostomy, esophagogastroscopy (11/2016), rheumatoid arthritis, multiple orthopedic issues.   Severe mitral regurgitation: I suspect her hospital admission in 11/2018 may have been secondary to atypical pulmonary edema from mitral regurgitation. Other differentials include pnuamonia, ?septic emboli, ?malignancy. She does need repeat CT chest. I will defer the follow up with PCP. She is minimally symptomatic form MR and has other ongoing noncardiac complaints. Pending resolution of noncardiac issues, and if worsening MR symptoms, will consider further workup.  Atrial fibrillation: Permanent. Not a candidate for anticoagulation due to life threatening GI bleeding history. S/p LAA closure 2017. S/p pacemaker placement (2017), likely for sinus node dysfunction.  Given that she is in Afib even on  digoxin, I have stopped her digoxin. In future, If needed, metoprolol dose can be increased.  F/u pacemaker check in 1 week. F/u w/me in 3 months.   Nigel Mormon, MD [facility]. [place] Pager: [phone] Office: [phone]

## 2019-01-18 ENCOUNTER — Telehealth: Payer: Medicare Other | Admitting: Pharmacist

## 2019-01-18 ENCOUNTER — Encounter: Payer: Self-pay | Admitting: Gastroenterology

## 2019-01-18 ENCOUNTER — Ambulatory Visit (INDEPENDENT_AMBULATORY_CARE_PROVIDER_SITE_OTHER): Payer: Medicare Other | Admitting: Gastroenterology

## 2019-01-18 VITALS — BP 124/68 | HR 86 | Temp 97.6°F | Ht 59.0 in | Wt 116.8 lb

## 2019-01-18 DIAGNOSIS — K219 Gastro-esophageal reflux disease without esophagitis: Secondary | ICD-10-CM | POA: Diagnosis not present

## 2019-01-18 DIAGNOSIS — K289 Gastrojejunal ulcer, unspecified as acute or chronic, without hemorrhage or perforation: Secondary | ICD-10-CM | POA: Diagnosis not present

## 2019-01-18 DIAGNOSIS — T85898A Other specified complication of other internal prosthetic devices, implants and grafts, initial encounter: Secondary | ICD-10-CM

## 2019-01-18 NOTE — Telephone Encounter (Signed)
Called Dr. Shirlee Limerick Patel's office and spoke with [facility]. According to [facility], patient is interested in receiving surgery to correct issues she has been having with vision. However, Dr. Posey Pronto will not be able to operate without a current MRI. According to [facility], PCP has to place the orders. Orders needed: MRI brain and orbits (CPT code: [phone] and [phone]) Dx code: H50.05 and H50.53  Imaging order needs to be placed at [facility], [facility] will not complete MRI due to patient's cardiac issues.   Given Dr. Serita Grit number if you have questions [phone]  To PCP

## 2019-01-18 NOTE — Progress Notes (Signed)
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  She was taking chronic NSAIDs for back pain, which she eventually discontinued. Seen for this in clinic on 08/09/2018, also for chronic left-sided abdominal pain causing multiple hospitalizations both here and in [place].  On 10/30/2018, EGD showed marked improvement in the ulceration compared to previous endoscopic images.  She had a stricture that would not allow the standard EGD scope to pass into the jejunum.  There was some retained fibrous material in the gastric pouch, patient was advised to be on a low fiber diet with liquid protein calorie supplements to maintain nutrition. Colonoscopy same date showed severe left-sided diverticulosis, fair preparation.   Deanna Spencer requested an office appointment for ongoing vomiting.  She did modify her diet as recommended before, generally low fiber.  She is able to keep down food and liquids most of the time, but is bothered by "vomiting" which sounds like severe regurgitation.  It might happen with some foods or water, but most of the time it is just "for off" and mucus coming up.  It can happen overnight even when she has her head of bed elevated, or during the day. She is increasingly anxious about the symptoms and wants to know what else could be done. She has maintained her weight over the last several months. Deanna Spencer tells me she is no longer using aspirin or NSAIDs.  ROS: Cardiovascular:  no chest pain Respiratory: no dyspnea Chronic anxiety Complains of chronic neck pain The patient's Past Medical, Family and Social History were reviewed and are on file in the EMR.  Objective:  Med list reviewed  Current Outpatient Medications:  .  acetaminophen (TYLENOL) 500 MG tablet, Take 1,000 mg every 6 (six) hours as needed by mouth  (pain). , Disp: , Rfl:  .  calcium citrate (CALCITRATE) 950 MG tablet, , Disp: , Rfl:  .  cholecalciferol (VITAMIN D3) 25 MCG (1000 UT) tablet, Take 1 tablet (1,000 Units total) by mouth daily., Disp: 90 tablet, Rfl: 0 .  famotidine (PEPCID) 40 MG tablet, Take 1 tablet (40 mg total) by mouth at bedtime., Disp: 90 tablet, Rfl: 3 .  furosemide (LASIX) 20 MG tablet, Take 1 tablet (20 mg total) by mouth as needed for edema., Disp: 60 tablet, Rfl: 3 .  gabapentin (NEURONTIN) 300 MG capsule, Please take 2 tablets three times daily starting in the morning (Patient taking differently: Take 300 mg by mouth 3 (three) times daily. ), Disp: 90 capsule, Rfl: 5 .  metoprolol tartrate (LOPRESSOR) 100 MG tablet, Take 100 mg by mouth 2 (two) times daily., Disp: , Rfl:  .  pantoprazole (PROTONIX) 40 MG tablet, Take 2 tablets (80 mg total) by mouth daily. (Patient taking differently: Take 40 mg by mouth 2 (two) times daily. ), Disp: 90 tablet, Rfl: 0 .  rOPINIRole (REQUIP) 0.5 MG tablet, Take 1 tablet (0.5 mg total) by mouth 3 (three) times daily., Disp: 90 tablet, Rfl: 2 .  sertraline (ZOLOFT) 50 MG tablet, Take 1 tablet (50 mg total) by mouth daily., Disp: 30 tablet, Rfl: 3 .  sucralfate (CARAFATE) 1 g tablet, Take 1 tablet (1 g total) by mouth every 6 (six) hours., Disp: 360 tablet, Rfl: 3   Vital signs in last 24 hrs: Vitals:   01/18/19 1519  BP: 124/68  Pulse: 86  Temp: 97.6 F (36.4 C)  Physical Exam  Thin, fair muscle mass, tearful at times  HEENT: sclera anicteric, oral mucosa moist without lesions  Neck: supple, no thyromegaly, JVD or lymphadenopathy  Cardiac: RRR without murmurs, S1S2 heard, no peripheral edema  Pulm: clear to auscultation bilaterally, normal RR and effort noted  Abdomen: soft, no tenderness, with active bowel sounds. No guarding or palpable hepatosplenomegaly.  Midline scars  Skin; warm and dry, no jaundice or rash   @ASSESSMENTPLANBEGIN @ Assessment: Encounter  Diagnoses  Name Primary?  . Gastroesophageal reflux disease without esophagitis Yes  . 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  This causes stasis and regurgitation, even just of liquid contents at times.  I do not know how much benefit she would get from dilation of the stricture.  I am not planning to proceed with that since I think it is a very high risk scenario.  She has had so much surgery on that area, the tissue is ulcerated and friable, and it would be at high risk of perforation.  As long as she can maintain her weight on a diet of pured consistency if necessary and protein calorie supplements, then I would not make any further interventions.  If her symptoms worsen to the point that she is unable to manage nutrition, then she would need reconsideration of surgical therapy.  Our local surgeons had seen her before and recommended that in that scenario, they would help arrange this patient some follow-up with an advanced bariatric surgeon at an academic institution.(Those conversations occurred during a 2020 hospitalization)  She will see me as needed.  Total time 20 minutes, all spent face-to-face with patient in counseling and coordination of care.  (Patient was also 15 minutes late for clinic appointment today) Deanna Spencer III

## 2019-01-18 NOTE — Patient Instructions (Signed)
If you are age 77 or older, your body mass index should be between 23-30. Your Body mass index is 23.59 kg/m. If this is out of the aforementioned range listed, please consider follow up with your Primary Care Provider.  If you are age 30 or younger, your body mass index should be between 19-25. Your Body mass index is 23.59 kg/m. If this is out of the aformentioned range listed, please consider follow up with your Primary Care Provider.   Follow up as needed.  It was a pleasure to see you today!  Dr. Loletha Carrow

## 2019-01-19 ENCOUNTER — Other Ambulatory Visit: Payer: Self-pay | Admitting: Family Medicine

## 2019-01-19 DIAGNOSIS — H5030 Unspecified intermittent heterotropia: Secondary | ICD-10-CM

## 2019-01-19 NOTE — Progress Notes (Signed)
Patient is being seen by opthamologist who after speaking with patient about the cause of her poor vision has contacted me to request I order an MRI so she can proceed with possible surgery for correction. Placing orders per patient and Dr. Posey Pronto request.

## 2019-01-21 ENCOUNTER — Ambulatory Visit
Admission: RE | Admit: 2019-01-21 | Discharge: 2019-01-21 | Disposition: A | Discharge status: Home / Self Care | Payer: Medicare Other | Source: Ambulatory Visit | Attending: Family Medicine | Admitting: Family Medicine

## 2019-01-21 DIAGNOSIS — J189 Pneumonia, unspecified organism: Secondary | ICD-10-CM

## 2019-01-21 MED ORDER — IOPAMIDOL (ISOVUE-300) INJECTION 61%
75.0000 mL | Freq: Once | INTRAVENOUS | Status: AC | PRN
  Administered 2019-01-21: 75 mL via INTRAVENOUS

## 2019-01-22 ENCOUNTER — Telehealth: Payer: Self-pay | Admitting: *Deleted

## 2019-01-22 NOTE — Telephone Encounter (Signed)
Rx request for lanoxin 62.11mcg tab. Gideon Burstein Kennon Holter, CMA

## 2019-01-23 ENCOUNTER — Ambulatory Visit: Payer: Medicare Other | Admitting: Cardiology

## 2019-01-24 ENCOUNTER — Ambulatory Visit: Payer: Medicare Other | Admitting: Pharmacist

## 2019-01-24 ENCOUNTER — Other Ambulatory Visit: Payer: Self-pay

## 2019-01-24 DIAGNOSIS — I5022 Chronic systolic (congestive) heart failure: Secondary | ICD-10-CM

## 2019-01-24 NOTE — Progress Notes (Signed)
  Chronic Care Management   Outreach Note  01/24/2019 Name: Deanna Spencer MRN: [id number] DOB: 11-Jan-1940  Referred by: Nuala Alpha, DO Reason for referral : Chronic Care Management   An unsuccessful telephone outreach was attempted today. The patient was referred to the case management team by for assistance with care management and care coordination.   Follow Up Plan: A HIPPA compliant phone message was left for the patient providing contact information and requesting a return call.  The care management team will reach out to the patient again over the next 7-10 business days.   SIGNATURE Regina Eck, PharmD, BCPS Clinical Pharmacist, [place]: [phone]

## 2019-01-29 ENCOUNTER — Ambulatory Visit: Payer: Self-pay

## 2019-01-29 ENCOUNTER — Other Ambulatory Visit: Payer: Self-pay

## 2019-01-29 ENCOUNTER — Ambulatory Visit: Payer: Medicare Other

## 2019-01-29 NOTE — Patient Instructions (Addendum)
Visit Information  Goals Addressed            This Visit's Progress   . "I have been having an issue with nausea and vomiting for a long time but it has gotten worse." (pt-stated)       Current Barriers:  . Chronic Disease Management support, education, and care coordination needs related to  nausea and vomiting  Clinical Goal(s) related to  nausea and vomiting :  Over the next 30 days, patient will:  . Work with the care management team to address educational, disease management, and care coordination needs  . Call provider office for new or worsened signs and symptoms  . Call care management team with questions or concerns . Verbalize basic understanding of patient centered plan of care established today  Interventions related to  nausea and vomiting :  . Evaluation of current treatment plans and patient's adherence to plan as established by provider . Assessed patient understanding of disease states . Assessed patient's education and care coordination needs . Provided disease specific education to patient  . Advised the patient to call her gastroenterologist today as the doctor stated at her visit on 01-14-19.  . 01/29/19 . Patient states that she is in [place] with her daughter for a vacation.  She is doing well. . She is taking her medication as prescribed. . She states that she is still struggling to eat. But is able to keep some food down. . She had a visit with the gastroenterologist, but was not pleased with the news.  She was tearful and worried.  She state that she would like to have  second  opinion. . Advised patient that it is her right to have a second opinion.  Patient Self Care Activities related to  nausea and vomiting :  . Patient is unable to independently self-manage chronic health conditions  Initial goal documentation        Ms. Espericueta was given information about Care Management services today including:  1. Care Management services include personalized  support from designated clinical staff supervised by her physician, including individualized plan of care and coordination with other care providers 2. 24/7 contact phone numbers for assistance for urgent and routine care needs. 3. The patient may stop CCM services at any time (effective at the end of the month) by phone call to the office staff.  Patient agreed to services and verbal consent obtained.   The patient verbalized understanding of instructions provided today and declined a print copy of patient instruction materials.   The care management team will reach out to the patient again over the next 14 days.  The patient has been provided with contact information for the care management team and has been advised to call with any health related questions or concerns.   Lazaro Arms RN, BSN, [facility] Care Management Coordinator [place] Phone: [phone] Fax: [phone]

## 2019-01-29 NOTE — Chronic Care Management (AMB) (Signed)
  Care Management   Follow Up Note   01/29/2019 Name: Deanna Spencer MRN: [id number] DOB: 09-13-1940  Referred by: Nuala Alpha, DO Reason for referral : Care Coordination (Care Management RNCM CHF/A-FIB)   Deanna Spencer is a 79 y.o. year old female who is a primary care patient of Nuala Alpha, DO. The care management team was consulted for assistance with care management and care coordination needs.    Review of patient status, including review of consultants reports, relevant laboratory and other test results, and collaboration with appropriate care team members and the patient's provider was performed as part of comprehensive patient evaluation and provision of chronic care management services.    SDOH (Social Determinants of Health) screening performed today: None. See Care Plan for related entries.   Advanced Directives: See Care Plan and Vynca application for related entries.   Goals Addressed            This Visit's Progress   . "I have been having an issue with nausea and vomiting for a long time but it has gotten worse." (pt-stated)       Current Barriers:  . Chronic Disease Management support, education, and care coordination needs related to  nausea and vomiting  Clinical Goal(s) related to  nausea and vomiting :  Over the next 30 days, patient will:  . Work with the care management team to address educational, disease management, and care coordination needs  . Call provider office for new or worsened signs and symptoms  . Call care management team with questions or concerns . Verbalize basic understanding of patient centered plan of care established today  Interventions related to  nausea and vomiting :  . Evaluation of current treatment plans and patient's adherence to plan as established by provider . Assessed patient understanding of disease states . Assessed patient's education and care coordination needs . Provided disease specific education to patient    . Advised the patient to call her gastroenterologist today as the doctor stated at her visit on 01-14-19.  . 01/29/19 . Patient states that she is in [place] with her daughter for a vacation.  She is doing well. . She is taking her medication as prescribed. . She states that she is still struggling to eat. But is able to keep some food down. . She had a visit with the gastroenterologist, but was not pleased with the news.  She was tearful and worried.  She state that she would like to have  second  opinion. . Advised patient that it is her right to have a second opinion.  Patient Self Care Activities related to  nausea and vomiting :  . Patient is unable to independently self-manage chronic health conditions  Initial goal documentation        The care management team will reach out to the patient again over the next 14 days.  The patient has been provided with contact information for the care management team and has been advised to call with any health related questions or concerns.   Lazaro Arms RN, BSN, [facility] Care Management Coordinator [place] Phone: [phone] Fax: [phone]

## 2019-01-29 NOTE — Chronic Care Management (AMB) (Signed)
  Care Management   Outreach Note  01/29/2019 Name: Deanna Spencer MRN: [id number] DOB: [birth year]  Referred by: Nuala Alpha, DO Reason for referral : Care Coordination (Care Management RNCM CHF/ A-FIB )   An unsuccessful telephone outreach was attempted today. The patient was referred to the case management team by for assistance with care management and care coordination.   Follow Up Plan: A HIPPA compliant phone message was left for the patient providing contact information and requesting a return call.  The care management team will reach out to the patient again over the next 14 days.   Lazaro Arms RN, BSN, [facility] Care Management Coordinator [place] Phone: [phone] Fax: [phone]

## 2019-01-31 ENCOUNTER — Ambulatory Visit: Payer: Medicare Other

## 2019-01-31 ENCOUNTER — Other Ambulatory Visit: Payer: Self-pay | Admitting: Pharmacist

## 2019-01-31 ENCOUNTER — Telehealth: Payer: Medicare Other

## 2019-01-31 NOTE — Patient Outreach (Addendum)
[facility]) Care Management  01/31/2019  Deanna Spencer 1940-05-27 [id number]  Patient called to discuss her last gastro visit. HIPAA identifiers were obtained.   Patient is a 79 year old female with multiple medical conditions including but not limited to:  AAA, A fib, Atrial Fibrillation, anemia, pacemaker placement, CHF, depression, GERD, gastrojejunal ulcer, osteoarthritis of right knee, peripheral neuropathy, seasonal allergies, and rheumatoid arthritis.  She reported being in [place] with her daughter.  She said she was very upset after her GI visit.  Patient said the GI provider said there was nothing else that could be done to correct her nausea and vomiting due to the number of surgeries she has undergone.  Reviewed Dr. Loletha Carrow' note which confirmed what the patient said. Dr. Loletha Carrow recommended the patient be careful with her diet and to consider pureed food.  Patient also so said a medication was discontinued but could not remember which one. From chart review, Dr. Monte Fantasia discontinued digoxin 0.625.  There was a refill request on 01/22/19 for digoxin.  Patient gets her medications filled with [facility] in pill packs. It is unclear if the refill request was sent or denied. Digoxin is not currently on the patient's medication list.  Plan: Send note to Lottie Dawson, PharmD who is the Embedded Clinical Pharmacist at the [facility]. Follow up in 2-3 weeks.  Deanna Spencer, PharmD, [place] Clinical Pharmacist [phone]

## 2019-02-07 ENCOUNTER — Telehealth: Payer: Medicare Other

## 2019-02-08 ENCOUNTER — Telehealth: Payer: Self-pay | Admitting: Rheumatology

## 2019-02-08 NOTE — Telephone Encounter (Signed)
Opened in error

## 2019-02-12 ENCOUNTER — Other Ambulatory Visit: Payer: Self-pay

## 2019-02-12 ENCOUNTER — Telehealth (INDEPENDENT_AMBULATORY_CARE_PROVIDER_SITE_OTHER): Payer: Medicare Other | Admitting: Family Medicine

## 2019-02-12 DIAGNOSIS — R1114 Bilious vomiting: Secondary | ICD-10-CM

## 2019-02-12 NOTE — Progress Notes (Signed)
[facility] Telemedicine Visit  Patient consented to have virtual visit. Method of visit: Telephone  Encounter participants: Patient: Deanna Spencer - located at home in [facility] Provider: Nuala Alpha - located at [facility] in [place] Others (if applicable): none  Chief Complaint: Vomiting  HPI: Patient states she has been throwing up 12-14 times per day and she can't keep any food down. She did see the Gastroenterologist and says he said he could not do anything for her and that he spoke with several surgeons in the area and that they would not recommend any more surgeries. 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 She has spoken to a GI doctor in [place], [place] before she moved to [place] that was her GI doctor before and he has agreed to see her, attempt a scope, and offer a second opinion. She does state that she does not wish to have a poor quality of life and go on if she "can't eat real food".  Dr. Bridgette Habermann is the GI doctor in [place] who she will see. She is on liquid diet. She does endorse having difficulty taking medications.  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 She is pursuing a second opinion from a previous GI doctor she was getting care from in [place]. She states he did her original gastric bypass and revision surgeries. 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  - Awaiting second opinion from Dr. Dorthula Rue in [place], will follow along - If no procedure can be done to improve her symptoms and if patient desires I will start palliative/hospice care at  patient request    Time spent during visit with patient: >25 minutes  Harolyn Rutherford, DO [place], PGY-3

## 2019-02-15 ENCOUNTER — Other Ambulatory Visit: Payer: Self-pay

## 2019-02-15 ENCOUNTER — Ambulatory Visit: Payer: Medicare Other

## 2019-02-15 NOTE — Assessment & Plan Note (Signed)
Patient had attempted EDG here in [place] and was told "nothing more could be done". She is pursuing a second opinion from a previous GI doctor she was getting care from in [place]. She states he did her original gastric bypass and revision surgeries. 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  - Awaiting second opinion from Dr. Dorthula Rue in [place], will follow along - If no procedure can be done to improve her symptoms and if patient desires I will start palliative/hospice care at patient request

## 2019-02-15 NOTE — Chronic Care Management (AMB) (Signed)
  Care Management   Outreach Note  02/15/2019 Name: Deanna Spencer MRN: [id number] DOB: Jul 20, 1940  Referred by: Nuala Alpha, DO Reason for referral : Care Coordination (Care Management RNCM CHF/ A-FIB)   An unsuccessful telephone outreach was attempted today. The patient was referred to the case management team by for assistance with care management and care coordination.   Follow Up Plan: A HIPPA compliant phone message was left for the patient providing contact information and requesting a return call.  The care management team will reach out to the patient again over the next 14 days.   Lazaro Arms RN, BSN, [facility] Care Management Coordinator [place] Phone: [phone] Fax: [phone]

## 2019-03-01 ENCOUNTER — Ambulatory Visit: Payer: Medicare Other

## 2019-03-01 ENCOUNTER — Other Ambulatory Visit: Payer: Self-pay | Admitting: Pharmacist

## 2019-03-01 ENCOUNTER — Other Ambulatory Visit: Payer: Self-pay

## 2019-03-01 NOTE — Patient Outreach (Addendum)
Deanna Spencer) Care Management  03/01/2019  Deanna Spencer Dec 11, 1940 [id number]   Patient called to report the findings from her second opinion in [place], [place]. HIPAA identifiers were obtained.  Patient said she had an EGD and the provider did a Dilation.   She said she has been able to keep food down and is feeling better.  She is still on a soft diet and is straining food through a colander but was so very thankful to feel better.  She was encouraged to call [facility] about her upcoming medication delivery.  Plan: Lottie Dawson, PharmD is following the patient in clinic as she is the embedded Pharmacist.   Elayne Guerin, PharmD, [place] Clinical Pharmacist [phone]

## 2019-03-01 NOTE — Chronic Care Management (AMB) (Signed)
  Care Management   Outreach Note  03/01/2019 Name: Deanna Spencer MRN: [id number] DOB: [birth year]  Referred by: Nuala Alpha, DO Reason for referral : Care Coordination (Care Management CHF/A-FIB 2nd attempt)   A second unsuccessful telephone outreach was attempted today. The patient was referred to the case management team for assistance with care management and care coordination.   Follow Up Plan: A HIPPA compliant phone message was left for the patient providing contact information and requesting a return call.  The care management team will reach out to the patient again over the next 5-7 days.   Lazaro Arms RN, BSN, [facility] Care Management Coordinator [place] Phone: [phone] Fax: [phone]

## 2019-03-04 ENCOUNTER — Ambulatory Visit: Payer: Self-pay | Admitting: Pharmacist

## 2019-03-05 ENCOUNTER — Other Ambulatory Visit: Payer: Self-pay

## 2019-03-05 ENCOUNTER — Other Ambulatory Visit: Payer: Medicare Other

## 2019-03-05 MED ORDER — SERTRALINE HCL 50 MG PO TABS: 50.0000 mg | ORAL_TABLET | Freq: Every day | ORAL | 3 refills | Status: AC

## 2019-03-06 ENCOUNTER — Other Ambulatory Visit: Payer: Self-pay

## 2019-03-07 ENCOUNTER — Other Ambulatory Visit: Payer: Self-pay

## 2019-03-07 MED ORDER — ROPINIROLE HCL 0.5 MG PO TABS: 0.5000 mg | ORAL_TABLET | Freq: Three times a day (TID) | ORAL | 2 refills | Status: AC

## 2019-03-07 MED ORDER — GABAPENTIN 300 MG PO CAPS: 300.0000 mg | ORAL_CAPSULE | Freq: Three times a day (TID) | ORAL | 1 refills | Status: DC

## 2019-03-10 ENCOUNTER — Other Ambulatory Visit: Payer: Self-pay

## 2019-03-10 ENCOUNTER — Ambulatory Visit: Payer: Medicare Other | Attending: Internal Medicine

## 2019-03-10 DIAGNOSIS — Z23 Encounter for immunization: Secondary | ICD-10-CM | POA: Insufficient documentation

## 2019-03-10 NOTE — Progress Notes (Signed)
   Covid-19 Vaccination Clinic  Name:  Deanna Spencer    MRN: [id number] DOB: 1940-11-06  03/10/2019  [name] was observed post Covid-19 immunization for 15 minutes without incidence. She was provided with Vaccine Information Sheet and instruction to access the V-Safe system.   [name] was instructed to call 911 with any severe reactions post vaccine: Marland Kitchen Difficulty breathing  . Swelling of your face and throat  . A fast heartbeat  . A bad rash all over your body  . Dizziness and weakness    Immunizations Administered    Name Date Dose VIS Date Route   Pfizer COVID-19 Vaccine 03/10/2019 12:25 PM 0.3 mL 12/21/2018 Intramuscular   Manufacturer: [place]   Lot: [id number]   [place]: [id number]

## 2019-03-11 ENCOUNTER — Other Ambulatory Visit: Payer: Self-pay

## 2019-03-11 ENCOUNTER — Ambulatory Visit: Payer: Medicare Other

## 2019-03-19 ENCOUNTER — Other Ambulatory Visit: Payer: Self-pay | Admitting: *Deleted

## 2019-03-19 MED ORDER — VITAMIN D 25 MCG (1000 UNIT) PO TABS: 1000.0000 [IU] | ORAL_TABLET | Freq: Every day | ORAL | 0 refills | Status: DC

## 2019-03-21 ENCOUNTER — Other Ambulatory Visit: Payer: Self-pay | Admitting: *Deleted

## 2019-03-22 ENCOUNTER — Ambulatory Visit: Payer: Self-pay | Admitting: Pharmacist

## 2019-03-22 MED ORDER — VITAMIN D 25 MCG (1000 UNIT) PO TABS: 1000.0000 [IU] | ORAL_TABLET | Freq: Every day | ORAL | 0 refills | Status: AC

## 2019-03-27 NOTE — Chronic Care Management (AMB) (Signed)
Care Management   Follow Up Note   03/27/2019 Name: Deanna Spencer MRN: [id number] DOB: 1940-08-22  Referred by: Nuala Alpha, DO Reason for referral : Care Coordination (Care Management RNCM CHF/ A FIB)   Deanna Spencer is a 79 y.o. year old female who is a primary care patient of Nuala Alpha, DO. The care management team was consulted for assistance with care management and care coordination needs.    Review of patient status, including review of consultants reports, relevant laboratory and other test results, and collaboration with appropriate care team members and the patient's provider was performed as part of comprehensive patient evaluation and provision of chronic care management services.    SDOH (Social Determinants of Health) assessments performed: No See Care Plan activities for detailed interventions related to [facility])     Advanced Directives: See Care Plan and Vynca application for related entries.   Goals Addressed            This Visit's Progress   . "I have been having an issue with nausea and vomiting for a long time but it has gotten worse." (pt-stated)       Current Barriers:  . Chronic Disease Management support, education, and care coordination needs related to  nausea and vomiting  Clinical Goal(s) related to  nausea and vomiting :  Over the next 30 days, patient will:  . Work with the care management team to address educational, disease management, and care coordination needs  . Call provider office for new or worsened signs and symptoms  . Call care management team with questions or concerns . Verbalize basic understanding of patient centered plan of care established today  Interventions related to  nausea and vomiting :  . Evaluation of current treatment plans and patient's adherence to plan as established by provider . Assessed patient understanding of disease states . Assessed patient's education and care coordination needs . Provided disease  specific education to patient  . Advised the patient to call her gastroenterologist today as the doctor stated at her visit on 01-14-19.  . 01/29/19 . Patient states that she is in [place] with her daughter for a vacation.  She is doing well. . She is taking her medication as prescribed. . She states that she is still struggling to eat. But is able to keep some food down. . She had a visit with the gastroenterologist, but was not pleased with the news.  She was tearful and worried.  She state that she would like to have  second  opinion. . Advised patient that it is her right to have a second opinion. . 03/11/19 . Spoke with the patient and she had been out of town for 3 weeks in [place] and having nausa and vomiting.  The patient stated that she had spoken with a physician in [place].  She came home and flew out to [place] had some test done and had her Throat steched using balloons. She stayed there 3 weeks  she has been able to keep food down and his weighing 112 lbs. . The patient states that she got her first covid 19 shot on 2/28 and her arm is sore some and having some palpitations but she is keeping a check on it and her daughters calls her every hour to check on her.  Patient Self Care Activities related to  nausea and vomiting :  . Patient is unable to independently self-manage chronic health conditions  Initial goal documentation  The patient has been provided with contact information for the care management team and has been advised to call with any health related questions or concerns.   Lazaro Arms RN, BSN, [facility] Care Management Coordinator [place] Phone: [phone] Fax: [phone]

## 2019-03-27 NOTE — Patient Instructions (Signed)
Visit Information  Goals Addressed            This Visit's Progress   . "I have been having an issue with nausea and vomiting for a long time but it has gotten worse." (pt-stated)       Current Barriers:  . Chronic Disease Management support, education, and care coordination needs related to  nausea and vomiting  Clinical Goal(s) related to  nausea and vomiting :  Over the next 30 days, patient will:  . Work with the care management team to address educational, disease management, and care coordination needs  . Call provider office for new or worsened signs and symptoms  . Call care management team with questions or concerns . Verbalize basic understanding of patient centered plan of care established today  Interventions related to  nausea and vomiting :  . Evaluation of current treatment plans and patient's adherence to plan as established by provider . Assessed patient understanding of disease states . Assessed patient's education and care coordination needs . Provided disease specific education to patient  . Advised the patient to call her gastroenterologist today as the doctor stated at her visit on 01-14-19.  . 01/29/19 . Patient states that she is in [place] with her daughter for a vacation.  She is doing well. . She is taking her medication as prescribed. . She states that she is still struggling to eat. But is able to keep some food down. . She had a visit with the gastroenterologist, but was not pleased with the news.  She was tearful and worried.  She state that she would like to have  second  opinion. . Advised patient that it is her right to have a second opinion. . 03/11/19 . Spoke with the patient and she had been out of town for 3 weeks in [place] and having nausa and vomiting.  The patient stated that she had spoken with a physician in [place].  She came home and flew out to [place] had some test done and had her Throat steched using balloons. She stayed there 3 weeks  she has been  able to keep food down and his weighing 112 lbs. . The patient states that she got her first covid 19 shot on 2/28 and her arm is sore some and having some palpitations but she is keeping a check on it and her daughters calls her every hour to check on her.  Patient Self Care Activities related to  nausea and vomiting :  . Patient is unable to independently self-manage chronic health conditions  Initial goal documentation        Ms. Pape was given information about Care Management services today including:  1. Care Management services include personalized support from designated clinical staff supervised by her physician, including individualized plan of care and coordination with other care providers 2. 24/7 contact phone numbers for assistance for urgent and routine care needs. 3. The patient may stop CCM services at any time (effective at the end of the month) by phone call to the office staff.  Patient agreed to services and verbal consent obtained.   The patient verbalized understanding of instructions provided today and declined a print copy of patient instruction materials.   The patient has been provided with contact information for the care management team and has been advised to call with any health related questions or concerns.   Lazaro Arms RN, BSN, [facility] Care Management Coordinator [place] Phone: [phone] Fax:  [phone]   

## 2019-04-01 ENCOUNTER — Ambulatory Visit: Payer: Medicare Other

## 2019-04-01 ENCOUNTER — Other Ambulatory Visit: Payer: Self-pay

## 2019-04-04 ENCOUNTER — Other Ambulatory Visit: Payer: Self-pay | Admitting: Family Medicine

## 2019-04-05 ENCOUNTER — Encounter: Payer: Self-pay | Admitting: Family Medicine

## 2019-04-05 ENCOUNTER — Ambulatory Visit (INDEPENDENT_AMBULATORY_CARE_PROVIDER_SITE_OTHER): Payer: Medicare Other | Admitting: Family Medicine

## 2019-04-05 ENCOUNTER — Other Ambulatory Visit: Payer: Self-pay

## 2019-04-05 ENCOUNTER — Emergency Department (HOSPITAL_COMMUNITY): Payer: Medicare Other

## 2019-04-05 ENCOUNTER — Inpatient Hospital Stay (HOSPITAL_COMMUNITY): Payer: Medicare Other

## 2019-04-05 ENCOUNTER — Inpatient Hospital Stay (HOSPITAL_COMMUNITY)
Admission: EM | Admit: 2019-04-05 | Discharge: 2019-04-11 | DRG: 308 | Disposition: E | Payer: Medicare Other | Attending: Pulmonary Disease | Admitting: Pulmonary Disease

## 2019-04-05 DIAGNOSIS — G629 Polyneuropathy, unspecified: Secondary | ICD-10-CM | POA: Diagnosis present

## 2019-04-05 DIAGNOSIS — I4821 Permanent atrial fibrillation: Secondary | ICD-10-CM | POA: Diagnosis present

## 2019-04-05 DIAGNOSIS — Z66 Do not resuscitate: Secondary | ICD-10-CM | POA: Diagnosis present

## 2019-04-05 DIAGNOSIS — I4892 Unspecified atrial flutter: Secondary | ICD-10-CM | POA: Diagnosis not present

## 2019-04-05 DIAGNOSIS — M069 Rheumatoid arthritis, unspecified: Secondary | ICD-10-CM | POA: Diagnosis present

## 2019-04-05 DIAGNOSIS — W1830XA Fall on same level, unspecified, initial encounter: Secondary | ICD-10-CM | POA: Diagnosis present

## 2019-04-05 DIAGNOSIS — I255 Ischemic cardiomyopathy: Secondary | ICD-10-CM | POA: Diagnosis present

## 2019-04-05 DIAGNOSIS — N179 Acute kidney failure, unspecified: Secondary | ICD-10-CM | POA: Diagnosis present

## 2019-04-05 DIAGNOSIS — Z90710 Acquired absence of both cervix and uterus: Secondary | ICD-10-CM

## 2019-04-05 DIAGNOSIS — I5022 Chronic systolic (congestive) heart failure: Secondary | ICD-10-CM | POA: Diagnosis present

## 2019-04-05 DIAGNOSIS — Z8679 Personal history of other diseases of the circulatory system: Secondary | ICD-10-CM

## 2019-04-05 DIAGNOSIS — Z20822 Contact with and (suspected) exposure to covid-19: Secondary | ICD-10-CM | POA: Diagnosis present

## 2019-04-05 DIAGNOSIS — I252 Old myocardial infarction: Secondary | ICD-10-CM

## 2019-04-05 DIAGNOSIS — J9601 Acute respiratory failure with hypoxia: Secondary | ICD-10-CM | POA: Diagnosis present

## 2019-04-05 DIAGNOSIS — E46 Unspecified protein-calorie malnutrition: Secondary | ICD-10-CM | POA: Diagnosis present

## 2019-04-05 DIAGNOSIS — I462 Cardiac arrest due to underlying cardiac condition: Secondary | ICD-10-CM | POA: Diagnosis present

## 2019-04-05 DIAGNOSIS — R339 Retention of urine, unspecified: Secondary | ICD-10-CM | POA: Diagnosis not present

## 2019-04-05 DIAGNOSIS — I4901 Ventricular fibrillation: Secondary | ICD-10-CM | POA: Diagnosis present

## 2019-04-05 DIAGNOSIS — Z950 Presence of cardiac pacemaker: Secondary | ICD-10-CM

## 2019-04-05 DIAGNOSIS — I340 Nonrheumatic mitral (valve) insufficiency: Secondary | ICD-10-CM | POA: Diagnosis not present

## 2019-04-05 DIAGNOSIS — Z95818 Presence of other cardiac implants and grafts: Secondary | ICD-10-CM

## 2019-04-05 DIAGNOSIS — F40240 Claustrophobia: Secondary | ICD-10-CM | POA: Diagnosis present

## 2019-04-05 DIAGNOSIS — R54 Age-related physical debility: Secondary | ICD-10-CM | POA: Diagnosis present

## 2019-04-05 DIAGNOSIS — I959 Hypotension, unspecified: Secondary | ICD-10-CM | POA: Diagnosis present

## 2019-04-05 DIAGNOSIS — Z8719 Personal history of other diseases of the digestive system: Secondary | ICD-10-CM | POA: Diagnosis not present

## 2019-04-05 DIAGNOSIS — Z96611 Presence of right artificial shoulder joint: Secondary | ICD-10-CM | POA: Diagnosis present

## 2019-04-05 DIAGNOSIS — Z6824 Body mass index (BMI) 24.0-24.9, adult: Secondary | ICD-10-CM | POA: Diagnosis not present

## 2019-04-05 DIAGNOSIS — Z885 Allergy status to narcotic agent status: Secondary | ICD-10-CM

## 2019-04-05 DIAGNOSIS — I714 Abdominal aortic aneurysm, without rupture: Secondary | ICD-10-CM | POA: Diagnosis present

## 2019-04-05 DIAGNOSIS — Z8711 Personal history of peptic ulcer disease: Secondary | ICD-10-CM | POA: Diagnosis not present

## 2019-04-05 DIAGNOSIS — K649 Unspecified hemorrhoids: Secondary | ICD-10-CM | POA: Diagnosis present

## 2019-04-05 DIAGNOSIS — S2232XA Fracture of one rib, left side, initial encounter for closed fracture: Secondary | ICD-10-CM | POA: Diagnosis present

## 2019-04-05 DIAGNOSIS — K219 Gastro-esophageal reflux disease without esophagitis: Secondary | ICD-10-CM | POA: Diagnosis present

## 2019-04-05 DIAGNOSIS — Z86718 Personal history of other venous thrombosis and embolism: Secondary | ICD-10-CM

## 2019-04-05 DIAGNOSIS — Z79899 Other long term (current) drug therapy: Secondary | ICD-10-CM

## 2019-04-05 DIAGNOSIS — G8929 Other chronic pain: Secondary | ICD-10-CM | POA: Diagnosis present

## 2019-04-05 DIAGNOSIS — Z981 Arthrodesis status: Secondary | ICD-10-CM

## 2019-04-05 DIAGNOSIS — Z96612 Presence of left artificial shoulder joint: Secondary | ICD-10-CM | POA: Diagnosis present

## 2019-04-05 DIAGNOSIS — Z96653 Presence of artificial knee joint, bilateral: Secondary | ICD-10-CM | POA: Diagnosis present

## 2019-04-05 DIAGNOSIS — G931 Anoxic brain damage, not elsewhere classified: Secondary | ICD-10-CM | POA: Diagnosis present

## 2019-04-05 DIAGNOSIS — I495 Sick sinus syndrome: Secondary | ICD-10-CM | POA: Diagnosis present

## 2019-04-05 DIAGNOSIS — Z515 Encounter for palliative care: Secondary | ICD-10-CM | POA: Diagnosis not present

## 2019-04-05 DIAGNOSIS — R34 Anuria and oliguria: Secondary | ICD-10-CM | POA: Diagnosis not present

## 2019-04-05 DIAGNOSIS — S2239XA Fracture of one rib, unspecified side, initial encounter for closed fracture: Secondary | ICD-10-CM

## 2019-04-05 DIAGNOSIS — Z9884 Bariatric surgery status: Secondary | ICD-10-CM

## 2019-04-05 DIAGNOSIS — Z96641 Presence of right artificial hip joint: Secondary | ICD-10-CM | POA: Diagnosis present

## 2019-04-05 DIAGNOSIS — Z8249 Family history of ischemic heart disease and other diseases of the circulatory system: Secondary | ICD-10-CM

## 2019-04-05 DIAGNOSIS — S06339A Contusion and laceration of cerebrum, unspecified, with loss of consciousness of unspecified duration, initial encounter: Secondary | ICD-10-CM | POA: Diagnosis present

## 2019-04-05 DIAGNOSIS — I469 Cardiac arrest, cause unspecified: Secondary | ICD-10-CM

## 2019-04-05 DIAGNOSIS — Z888 Allergy status to other drugs, medicaments and biological substances status: Secondary | ICD-10-CM

## 2019-04-05 DIAGNOSIS — M810 Age-related osteoporosis without current pathological fracture: Secondary | ICD-10-CM | POA: Diagnosis present

## 2019-04-05 DIAGNOSIS — Z8042 Family history of malignant neoplasm of prostate: Secondary | ICD-10-CM

## 2019-04-05 LAB — POCT I-STAT 7, (LYTES, BLD GAS, ICA,H+H)
Acid-base deficit: 18 mmol/L — ABNORMAL HIGH (ref 0.0–2.0)
Bicarbonate: 12 mmol/L — ABNORMAL LOW (ref 20.0–28.0)
Calcium, Ion: 1.11 mmol/L — ABNORMAL LOW (ref 1.15–1.40)
HCT: 37 % (ref 36.0–46.0)
Hemoglobin: 12.6 g/dL (ref 12.0–15.0)
O2 Saturation: 100 %
Potassium: 3.3 mmol/L — ABNORMAL LOW (ref 3.5–5.1)
Sodium: 142 mmol/L (ref 135–145)
TCO2: 13 mmol/L — ABNORMAL LOW (ref 22–32)
pCO2 arterial: 43.7 mmHg (ref 32.0–48.0)
pH, Arterial: 7.046 — CL (ref 7.350–7.450)
pO2, Arterial: 311 mmHg — ABNORMAL HIGH (ref 83.0–108.0)

## 2019-04-05 LAB — RESPIRATORY PANEL BY RT PCR (FLU A&B, COVID)
Influenza A by PCR: NEGATIVE
Influenza B by PCR: NEGATIVE
SARS Coronavirus 2 by RT PCR: NEGATIVE

## 2019-04-05 MED ORDER — ORAL CARE MOUTH RINSE: 15.0000 mL | OROMUCOSAL | Status: DC

## 2019-04-05 MED ORDER — CHLORHEXIDINE GLUCONATE 0.12% ORAL RINSE (MEDLINE KIT): 15.0000 mL | Freq: Two times a day (BID) | OROMUCOSAL | Status: DC

## 2019-04-05 MED ORDER — PROPOFOL 1000 MG/100ML IV EMUL: 5.0000 ug/kg/min | INTRAVENOUS | Status: DC

## 2019-04-05 MED ORDER — ACETAMINOPHEN 325 MG PO TABS: 650.0000 mg | ORAL_TABLET | Freq: Four times a day (QID) | ORAL | Status: DC | PRN

## 2019-04-05 MED ORDER — TRAMADOL HCL 50 MG PO TABS: 50.0000 mg | ORAL_TABLET | Freq: Three times a day (TID) | ORAL | 0 refills | Status: AC | PRN

## 2019-04-05 MED ORDER — DEXTROSE 5 % IV SOLN: INTRAVENOUS | Status: DC

## 2019-04-05 MED ORDER — PANTOPRAZOLE SODIUM 40 MG IV SOLR: 40.0000 mg | Freq: Every day | INTRAVENOUS | Status: DC

## 2019-04-05 MED ORDER — BISACODYL 10 MG RE SUPP: 10.0000 mg | Freq: Every day | RECTAL | Status: DC | PRN

## 2019-04-05 MED ORDER — FENTANYL CITRATE (PF) 100 MCG/2ML IJ SOLN: 25.0000 ug | INTRAMUSCULAR | Status: DC | PRN

## 2019-04-05 MED ORDER — GLYCOPYRROLATE 0.2 MG/ML IJ SOLN: 0.2000 mg | INTRAMUSCULAR | Status: DC | PRN

## 2019-04-05 MED ORDER — MIDAZOLAM HCL 2 MG/2ML IJ SOLN: 1.0000 mg | INTRAMUSCULAR | Status: DC | PRN

## 2019-04-05 MED ORDER — LIDOCAINE-PRILOCAINE 2.5-2.5 % EX CREA: 1.0000 "application " | TOPICAL_CREAM | CUTANEOUS | 0 refills | Status: AC | PRN

## 2019-04-05 MED ORDER — MORPHINE SULFATE (PF) 4 MG/ML IV SOLN
4.0000 mg | Freq: Once | INTRAVENOUS | Status: AC
  Administered 2019-04-05: 4 mg via INTRAVENOUS
  Filled 2019-04-05: qty 1

## 2019-04-05 MED ORDER — ACETAMINOPHEN 650 MG RE SUPP: 650.0000 mg | Freq: Four times a day (QID) | RECTAL | Status: DC | PRN

## 2019-04-05 MED ORDER — MORPHINE SULFATE (PF) 2 MG/ML IV SOLN
2.0000 mg | INTRAVENOUS | Status: DC | PRN
  Administered 2019-04-05: 2 mg via INTRAVENOUS
  Filled 2019-04-05: qty 1

## 2019-04-05 MED ORDER — MORPHINE 100MG IN NS 100ML (1MG/ML) PREMIX INFUSION
0.0000 mg/h | INTRAVENOUS | Status: DC
  Administered 2019-04-05: 5 mg/h via INTRAVENOUS
  Filled 2019-04-05: qty 100

## 2019-04-05 MED ORDER — EPINEPHRINE 1 MG/10ML IJ SOSY
PREFILLED_SYRINGE | INTRAMUSCULAR | Status: AC | PRN
  Administered 2019-04-05: 1 via INTRAVENOUS

## 2019-04-05 MED ORDER — DIPHENHYDRAMINE HCL 50 MG/ML IJ SOLN: 25.0000 mg | INTRAMUSCULAR | Status: DC | PRN

## 2019-04-05 MED ORDER — MORPHINE BOLUS VIA INFUSION
5.0000 mg | INTRAVENOUS | Status: DC | PRN
  Filled 2019-04-05: qty 5

## 2019-04-05 MED ORDER — GLYCOPYRROLATE 1 MG PO TABS
1.0000 mg | ORAL_TABLET | ORAL | Status: DC | PRN
  Filled 2019-04-05: qty 1

## 2019-04-05 MED ORDER — FENTANYL CITRATE (PF) 100 MCG/2ML IJ SOLN
INTRAMUSCULAR | Status: AC
  Administered 2019-04-05: 50 ug
  Filled 2019-04-05: qty 2

## 2019-04-05 MED ORDER — MIDAZOLAM HCL 2 MG/2ML IJ SOLN
INTRAMUSCULAR | Status: AC
  Administered 2019-04-05: 2 mg
  Filled 2019-04-05: qty 2

## 2019-04-05 MED ORDER — LORAZEPAM 2 MG/ML IJ SOLN
2.0000 mg | INTRAMUSCULAR | Status: DC | PRN
  Administered 2019-04-06: 2 mg via INTRAVENOUS
  Filled 2019-04-05: qty 1

## 2019-04-05 MED ORDER — POLYVINYL ALCOHOL 1.4 % OP SOLN: 1.0000 [drp] | Freq: Four times a day (QID) | OPHTHALMIC | Status: DC | PRN

## 2019-04-05 MED ORDER — PANTOPRAZOLE SODIUM 40 MG IV SOLR
40.0000 mg | INTRAVENOUS | Status: DC
  Administered 2019-04-06 (×2): 40 mg via INTRAVENOUS
  Filled 2019-04-05 (×2): qty 40

## 2019-04-05 NOTE — Progress Notes (Signed)
RT transported pt to CT and back with RN. No complications. RT will continue to monitor.

## 2019-04-05 NOTE — Progress Notes (Signed)
    SUBJECTIVE:   CHIEF COMPLAINT / HPI:   Rectal pain and itching Patient has had several days of blood spotting on the toilet paper, painful defecation, and itching. She says it hurts more to sit down than to have a bowel movement but it is very uncomfortable. She has had this in the past and states it was hemorrhoids last time. No blood in the stool or toilet after bowel movements, has tried Preparation H at home with little help.   PERTINENT  PMH / PSH: A Fib, HTN, Gastroparesis  OBJECTIVE:   BP 112/66   Pulse 82   Wt 116 lb 8 oz (52.8 kg)   SpO2 98%   BMI 23.53 kg/m   Gen: NAD CV: RRR, no murmurs, normal S1, S2 split Resp: CTAB Ext: no clubbing, cyanosis, or edema Skin: warm, dry, intact, no rashes  Procedure Anoscopy - lidocaine ointment applied to the area then lubricated anoscope inserted to the rectum. Several internal hemorrhoids noted, no bleeding. No anal fissure seen.  ASSESSMENT/PLAN:   Hemorrhoids Several large internal hemorrhoids seen on anoscopy. Tucks pads and lidocaine ointment with miralax.     Nuala Alpha, [place]

## 2019-04-05 NOTE — Progress Notes (Signed)
eLink Physician-Brief Progress Note Patient Name: Deanna Spencer DOB: [birth year] MRN: [id number]   Date of Service  03/19/2019  HPI/Events of Note  Pt's daughter requesting anti-acid medication for their mother who is having dyspeptic symptoms.  eICU Interventions  Protonix 40 mg iv Q 24 hours ordered.        Kerry Kass Lukis Bunt 03/19/2019, 11:57 PM

## 2019-04-05 NOTE — ED Notes (Signed)
Pt opened eyes when moved to CT table.  Pt moving all extremities.  Pt trying to sit up on CT table.   Fentanyl 61mcg IV and Versed 2mg  IV given

## 2019-04-05 NOTE — Code Documentation (Signed)
Pulse check   Femoral pulse present

## 2019-04-05 NOTE — Progress Notes (Signed)
RN notified of critical ABG results. 

## 2019-04-05 NOTE — Code Documentation (Signed)
17:20  CPR continued.  No resp effort present

## 2019-04-05 NOTE — H&P (Signed)
NAME:  Deanna Spencer, MRN:  [id number], DOB:  1940/03/05, LOS: 0 ADMISSION DATE:  04/01/2019, CONSULTATION DATE:  04/07/2019 REFERRING MD:  Dr. Garlan Fillers, ER, CHIEF COMPLAINT:  Cardiac arrest   Brief History   79 yo female had witnessed arrest.  Noted to be in VF.  ROSC after 45 minutes.  Family opted to continue short term trial of supportive care, but DNR otherwise.  History of present illness   79 yo female was in usual state of health and out shopping at [place].  Had witnessed arrest.  On arrival by EMS had agonal respiratory efforts.  Noted to be in VF.  Shocked x 3, and given amiodarone.  Then had PEA and then VT with repeated shock.  Had ROSC after 45 minutes.  On arrival by family medical team informed pt had DNR status.  They opted to continue supportive care to see if she showed any signs of improvement.  Past Medical History  PUD, Sick sinus syndrome, Bladder spasm, RA, Allergies, OA, Neuropathy, Osteoporosis, IBS, Hiatal hernia, Gout, GERD, Diverticulosis, Systolic CHF, A fib  Significant Hospital Events   3/26 Admit  Consults:    Procedures:  ETT 3/26 >>  Significant Diagnostic Tests:    Micro Data:  SARS CoV2 3/26 >>  Antimicrobials:     Interim history/subjective:    Objective   Blood pressure 117/66, pulse 60, resp. rate (!) 24, SpO2 99 %.    Vent Mode: PRVC FiO2 (%):  [50 %-100 %] 50 % Set Rate:  [20 bmp] 20 bmp Vt Set:  [340 mL] 340 mL PEEP:  [5 cmH20] 5 cmH20  No intake or output data in the 24 hours ending 03/26/2019 1851 There were no vitals filed for this visit.  Examination:  General - hematoma on side of Lt upper forehead Eyes - pupils reactive, roving eye movements ENT - ETT in place Cardiac - irregular Chest - b/l rhonchi Abdomen - soft, non tender, + bowel sounds Extremities - no cyanosis, clubbing, or edema Skin - no rashes Neuro - no response to stimuli   Resolved Hospital Problem list     Assessment & Plan:   Acute hypoxic  respiratory failure in setting of cardiac arrest. - continue vent support for now - family agrees that pt would not want long term support if neuro status doesn't improve  VF cardiac arrest. Permanent A fib s/p Watchman device. Chronic systolic CHF with ischemic CM. - monitor hemodynamics - hold outpt lasix, lopressor  Acute anoxic encephalopathy. Hx of chronic pain. - f/u CT head, EEG - monitor neuro status - hold outpt neurontin, requip, zoloft, tramadol  Hx of PUD with GI bleeding. - protonix  Goals of care. - updated pt's family at bedside - agreed to DNR status if she has recurrent cardiac arrest - want to give short term trial of supportive therapy before deciding whether to transition to comfort measures  Best practice:  Diet: NPO DVT prophylaxis: SCDs GI prophylaxis: Protonix Mobility: Bed rest Code Status: DNR Disposition: ICU  Labs   CBC: Recent Labs  Lab 04/08/2019 1750  HGB 12.6  HCT 99991111    Basic Metabolic Panel: Recent Labs  Lab 04/01/2019 1750  NA 142  K 3.3*   GFR: CrCl cannot be calculated (Patient's most recent lab result is older than the maximum 21 days allowed.). No results for input(s): PROCALCITON, WBC, LATICACIDVEN in the last 168 hours.  Liver Function Tests: No results for input(s): AST, ALT, ALKPHOS, BILITOT, PROT,  ALBUMIN in the last 168 hours. No results for input(s): LIPASE, AMYLASE in the last 168 hours. No results for input(s): AMMONIA in the last 168 hours.  ABG    Component Value Date/Time   PHART 7.046 (LL) 04/06/2019 1750   PCO2ART 43.7 04/04/2019 1750   PO2ART 311.0 (H) 03/13/2019 1750   HCO3 12.0 (L) 03/17/2019 1750   TCO2 13 (L) 03/20/2019 1750   ACIDBASEDEF 18.0 (H) 03/14/2019 1750   O2SAT 100.0 03/21/2019 1750     Coagulation Profile: No results for input(s): INR, PROTIME in the last 168 hours.  Cardiac Enzymes: No results for input(s): CKTOTAL, CKMB, CKMBINDEX, TROPONINI in the last 168 hours.  HbA1C: No  results found for: HGBA1C  CBG: No results for input(s): GLUCAP in the last 168 hours.  Review of Systems:   Unable to obtain  Past Medical History  She,  has a past medical history of AAA (abdominal aortic aneurysm) without rupture ([place]) (12/21/2016), Anemia, Atrial fibrillation ([place]) (11/22/2016), Childhood asthma, Chronic lower back pain, Chronic systolic heart failure ([place]) (12/14/2016), Diplopia (09/27/2017), Diverticulosis, Duodenal ulcer, Dysrhythmia, GERD (gastroesophageal reflux disease), GI bleed, Gout, Heart attack ([place]) (2017), Heart murmur, History of anastomosis and bypass hemorrhage (05/06/2018), History of blood transfusion (2017-2018), History of duodenal ulcer (11/18/2016), History of heart attack (11/18/2016), History of hiatal hernia, blood clots, IBS (irritable bowel syndrome), Liver lesion (08/07/2017), Neuropathy, Osteoarthritis of right knee (05/30/2017), Osteoporosis, Other chronic pain (11/18/2016), Parasomnia (11/18/2016), Perforated viscus (11/22/2016), Peripheral neuropathy, 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   Surgical History    Past Surgical History:  Procedure Laterality Date  . ABDOMINAL HYSTERECTOMY    . APPENDECTOMY    . ARTHROPLASTY Right 06/02/2017   RIGHT PATELLA-FEMORAL ARTHROPLASTY, with placement of a cemented 29 mm Stryker asymmetric triathlon patellar button, revision of the tibial polyethylene bearing.  Marland Kitchen BIOPSY  05/06/2018   Procedure: BIOPSY;  Surgeon: Gatha Mayer, MD;  Location: [facility];  Service: Endoscopy;;  . CARPAL TUNNEL RELEASE Bilateral   . CATARACT EXTRACTION W/ INTRAOCULAR LENS  IMPLANT, BILATERAL Bilateral   . CHOLECYSTECTOMY OPEN    . COLONOSCOPY WITH PROPOFOL N/A 10/30/2018   Procedure: COLONOSCOPY WITH PROPOFOL;  Surgeon: Doran Stabler, MD;  Location: [facility];  Service: Gastroenterology;  Laterality: N/A;  . DILATION AND CURETTAGE OF UTERUS    . ESOPHAGOGASTRODUODENOSCOPY (EGD) WITH PROPOFOL N/A 05/06/2018   Procedure: ESOPHAGOGASTRODUODENOSCOPY (EGD) WITH PROPOFOL;  Surgeon: Gatha Mayer, MD;  Location: [facility];  Service: Endoscopy;  Laterality: N/A;  . ESOPHAGOGASTRODUODENOSCOPY (EGD) WITH PROPOFOL N/A 10/30/2018   Procedure: ESOPHAGOGASTRODUODENOSCOPY (EGD) WITH PROPOFOL;  Surgeon: Doran Stabler, MD;  Location: [facility];  Service: Gastroenterology;  Laterality: N/A;  . FOOT FUSION Right   . INGUINAL HERNIA REPAIR Right X 2  . INSERT / REPLACE / REMOVE PACEMAKER  2017   medtronic  . JOINT REPLACEMENT    . LAPAROTOMY N/A 11/21/2016   Procedure: EXPLORATORY LAPAROTOMY, REVISION OF  GASTROJEJUNOSTOMY, ESOPHAGOGASTROSCOPY;  Surgeon: Clovis Riley, MD;  Location: [facility];  Service: General;  Laterality: N/A;  . LEFT ATRIAL APPENDAGE OCCLUSION  2018   placed  via groin  . LUMBAR  FUSION  X 2  . PATELLA-FEMORAL ARTHROPLASTY Right 06/02/2017   Procedure: RIGHT PATELLA-FEMORAL ARTHROPLASTY;  Surgeon: Frederik Pear, MD;  Location: [facility];  Service: Orthopedics;  Laterality: Right;  . REVERSE SHOULDER ARTHROPLASTY Left    reverse shoulder replacement x2  . ROUX-EN-Y GASTRIC BYPASS  2005  . ROUX-EN-Y PROCEDURE  20017 X 2   "revisions"  . TONSILLECTOMY AND ADENOIDECTOMY    . TOTAL HIP ARTHROPLASTY Right 02/24/2017   Procedure: TOTAL HIP ARTHROPLASTY;  Surgeon: Earlie Server, MD;  Location: [facility];  Service: Orthopedics;  Laterality: Right;  . TOTAL KNEE ARTHROPLASTY Bilateral   . TOTAL SHOULDER ARTHROPLASTY Bilateral   . TUBAL  LIGATION       Social History   reports that she has never smoked. She has never used smokeless tobacco. She reports that she does not drink alcohol or use drugs.   Family History   Her family history includes Heart disease in her mother; Liver disease in her father; Prostate cancer in her brother; Ulcers in her father. There is no history of Colon cancer.   Allergies Allergies  Allergen Reactions  . Demerol [Meperidine] Anaphylaxis  . Nsaids     Patient has had MULTIPLE revisions of her gastrojejunostomy for marginal ulcers including recent perforation     Home Medications  Prior to Admission medications   Medication Sig Start Date End Date Taking? Authorizing Provider  acetaminophen (TYLENOL) 500 MG tablet Take 1,000 mg every 6 (six) hours as needed by mouth (pain).     [provider]  calcium citrate (CALCITRATE) 950 MG tablet     [provider]  cholecalciferol (VITAMIN D3) 25 MCG (1000 UNIT) tablet Take 1 tablet (1,000 Units total) by mouth daily. 03/22/19   Nuala Alpha, DO  famotidine (PEPCID) 40 MG tablet Take 1 tablet (40 mg total) by mouth at bedtime. 08/29/18   Nuala Alpha, DO  furosemide (LASIX) 20 MG tablet Take 1 tablet (20 mg total) by mouth as needed for edema. 01/17/19 04/17/19  Patwardhan, Reynold Bowen, MD  gabapentin (NEURONTIN) 300 MG capsule TAKE 1 CAPSULE BY MOUTH THREE TIMES A DAY 03/22/2019   Lockamy, Timothy, DO  lidocaine-prilocaine (EMLA) cream Apply 1 application topically as needed. Apply to the rectum as needed every 3 hours 03/14/2019   Nuala Alpha, DO  metoprolol tartrate (LOPRESSOR) 100 MG tablet Take 100 mg by mouth 2 (two) times daily.    [provider]  pantoprazole (PROTONIX) 40 MG tablet Take 2 tablets (80 mg total) by mouth daily. Patient taking differently: Take 40 mg by mouth 2 (two) times daily.  12/11/18   Martyn Malay, MD  rOPINIRole (REQUIP) 0.5 MG tablet Take 1 tablet (0.5 mg total) by mouth 3 (three) times  daily. 03/07/19   Nuala Alpha, DO  sertraline (ZOLOFT) 50 MG tablet Take 1 tablet (50 mg total) by mouth daily. 03/05/19   Nuala Alpha, DO  sucralfate (CARAFATE) 1 g tablet Take 1 tablet (1 g total) by mouth every 6 (six) hours. 08/29/18   Nuala Alpha, DO  traMADol (ULTRAM) 50 MG tablet Take 1 tablet (50 mg total) by mouth every 8 (eight) hours as needed for up to 7 days. 03/12/2019 04/12/19  Nuala Alpha, DO     Critical care time: 36 minutes     Chesley Mires, MD [place] 03/23/2019, 6:52 PM

## 2019-04-05 NOTE — Patient Instructions (Addendum)
Hemorrhoids Hemorrhoids are swollen veins that may develop:  In the butt (rectum). These are called internal hemorrhoids.  Around the opening of the butt (anus). These are called external hemorrhoids. Hemorrhoids can cause pain, itching, or bleeding. Most of the time, they do not cause serious problems. They usually get better with diet changes, lifestyle changes, and other home treatments. What are the causes? This condition may be caused by:  Having trouble pooping (constipation).  Pushing hard (straining) to poop.  Watery poop (diarrhea).  Pregnancy.  Being very overweight (obese).  Sitting for long periods of time.  Heavy lifting or other activity that causes you to strain.  Anal sex.  Riding a bike for a long period of time. What are the signs or symptoms? Symptoms of this condition include:  Pain.  Itching or soreness in the butt.  Bleeding from the butt.  Leaking poop.  Swelling in the area.  One or more lumps around the opening of your butt. How is this diagnosed? A doctor can often diagnose this condition by looking at the affected area. The doctor may also:  Do an exam that involves feeling the area with a gloved hand (digital rectal exam).  Examine the area inside your butt using a small tube (anoscope).  Order blood tests. This may be done if you have lost a lot of blood.  Have you get a test that involves looking inside the colon using a flexible tube with a camera on the end (sigmoidoscopy or colonoscopy). How is this treated? This condition can usually be treated at home. Your doctor may tell you to change what you eat, make lifestyle changes, or try home treatments. If these do not help, procedures can be done to remove the hemorrhoids or make them smaller. These may involve:  Placing rubber bands at the base of the hemorrhoids to cut off their blood supply.  Injecting medicine into the hemorrhoids to shrink them.  Shining a type of light  energy onto the hemorrhoids to cause them to fall off.  Doing surgery to remove the hemorrhoids or cut off their blood supply. Follow these instructions at home: Eating and drinking   Eat foods that have a lot of fiber in them. These include whole grains, beans, nuts, fruits, and vegetables.  Ask your doctor about taking products that have added fiber (fibersupplements).  Reduce the amount of fat in your diet. You can do this by: ? Eating low-fat dairy products. ? Eating less red meat. ? Avoiding processed foods.  Drink enough fluid to keep your pee (urine) pale yellow. Managing pain and swelling   Take a warm-water bath (sitz bath) for 20 minutes to ease pain. Do this 3-4 times a day. You may do this in a bathtub or using a portable sitz bath that fits over the toilet.  If told, put ice on the painful area. It may be helpful to use ice between your warm baths. ? Put ice in a plastic bag. ? Place a towel between your skin and the bag. ? Leave the ice on for 20 minutes, 2-3 times a day. General instructions  Take over-the-counter and prescription medicines only as told by your doctor. ? Medicated creams and medicines may be used as told.  Exercise often. Ask your doctor how much and what kind of exercise is best for you.  Go to the bathroom when you have the urge to poop. Do not wait.  Avoid pushing too hard when you poop.  Keep your   butt dry and clean. Use wet toilet paper or moist towelettes after pooping.  Do not sit on the toilet for a long time.  Keep all follow-up visits as told by your doctor. This is important. Contact a doctor if you:  Have pain and swelling that do not get better with treatment or medicine.  Have trouble pooping.  Cannot poop.  Have pain or swelling outside the area of the hemorrhoids. Get help right away if you have:  Bleeding that will not stop. Summary  Hemorrhoids are swollen veins in the butt or around the opening of the  butt.  They can cause pain, itching, or bleeding.  Eat foods that have a lot of fiber in them. These include whole grains, beans, nuts, fruits, and vegetables.  Take a warm-water bath (sitz bath) for 20 minutes to ease pain. Do this 3-4 times a day. This information is not intended to replace advice given to you by your health care provider. Make sure you discuss any questions you have with your health care provider. Document Revised: 01/04/2018 Document Reviewed: 05/18/2017 Elsevier Patient Education  2020 [employer]  

## 2019-04-05 NOTE — ED Provider Notes (Signed)
[facility] EMERGENCY DEPARTMENT Provider Note   CSN: [id number] Arrival date & time: 03/25/2019  1718     History No chief complaint on file.   Deanna Spencer is a 79 y.o. female.  79 year old female with prior medical history as detailed below presents in cardiac arrest.  Patient had witnessed arrest while shopping at [employer].  EMS reports that she fell struck, her head, and then had loss of pulses.  EMS reports 45 minutes of nearly continuous CPR prior to arrival.  Patient was noted to be in V. fib.  She was shocked multiple times.  She is received amiodarone and multiple doses of epi prior to arrival.  Patient is intubated on arrival.  She has minimal spontaneous attempts at breathing over BVM.  CPR continued.  Epi x1 given.  ROSC is noted after 1 dose of epinephrine and 5 minutes of CPR in the ED.   Patient's daughter arrived nearly concurrently with the patient in the ED.  Patient's daughter reports the patient is DNR.  The daughter reports that the patient would not want aggressive care.  The history is provided by the patient and medical records.  Cardiac Arrest Witnessed by:  Bystander Incident location: [employer]. Time before BLS initiated:  Immediate Condition upon EMS arrival:  Unresponsive      Past Medical History:  Diagnosis Date  . AAA (abdominal aortic aneurysm) without rupture ([place]) 12/21/2016   Stable 3cm 4/20, needs f/u imaging ~4/23  . Anemia   . Atrial fibrillation ([place]) 11/22/2016  . Childhood asthma    connected to allergies  . Chronic lower back pain   . Chronic systolic heart failure ([place]) 12/14/2016  . Diplopia 09/27/2017  . Diverticulosis   . Duodenal ulcer   . Dysrhythmia    a fib  . GERD (gastroesophageal reflux disease)   . GI bleed    "have had both upper and lower GIB"  . Gout   . Heart attack ([place]) 2017  . Heart murmur   . History of anastomosis and bypass hemorrhage 05/06/2018  . History of blood transfusion  2017-2018   "related to bleeding ulcers; 47 units PRBC; 2U plasma" (11/23/2016)  . History of duodenal ulcer 11/18/2016  . History of heart attack 11/18/2016  . History of hiatal hernia   . Hx of blood clots    in heart  . IBS (irritable bowel syndrome)   . Liver lesion 08/07/2017  . Neuropathy   . Osteoarthritis of right knee 05/30/2017  . Osteoporosis   . Other chronic pain 11/18/2016  . Parasomnia 11/18/2016  . Perforated viscus 11/22/2016  . Peripheral neuropathy   . 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 Presence of Watchman left atrial appendage closure device   . Primary osteoarthritis of left hip 02/24/2017  . 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 Primary osteoarthritis of right knee 06/02/2017  . Raynaud disease   . Rheumatoid arthritis involving both hands ([place]) 09/27/2017  . S/P exploratory laparotomy 11/22/2016  . Seasonal allergies 08/07/2017  . 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 SSS (sick sinus syndrome) ([place])    a. s/p Medtronic PPM placement in 2017  . Stomach ulcer   . Vision changes 08/07/2017    Patient Active Problem List   Diagnosis Date Noted  . Cardiac arrest ([place]) 03/24/2019  . Nonrheumatic mitral valve regurgitation 01/17/2019  . Vomiting 01/14/2019  . Community acquired pneumonia 12/10/2018  . Abnormal loss  of weight   . Anastomotic ulcer S/P gastric bypass   . Depression, major, single episode, mild ([place]) 09/20/2018  . Hearing loss 09/20/2018  . History of double vision 09/20/2018  . Fatigue 08/01/2018  . Left shoulder pain 06/30/2018  . Chronic right shoulder pain 06/14/2018  . Anemia due to chronic blood loss 05/07/2018  . Odynophagia 05/07/2018  . Protein-calorie malnutrition ([place]) 05/07/2018  . Absolute anemia   . Cardiac pacemaker in situ 05/06/2018  . History of anastomosis and bypass hemorrhage 05/06/2018  . Atrial fibrillation with rapid ventricular response ([place])   . Dehydration   .  Bilious vomiting with nausea   . Gastrojejunal ulcer   . Acute abdominal pain 05/05/2018  . Rheumatoid arthritis involving both hands ([place]) 09/27/2017  . Seasonal allergies 08/07/2017  . Primary osteoarthritis of right knee 06/02/2017  . Osteoarthritis of right knee 05/30/2017  . Primary osteoarthritis of left hip 02/24/2017  . AAA (abdominal aortic aneurysm) without rupture ([place]) 12/21/2016  . Chronic systolic heart failure ([place]) 12/14/2016  . Atrial fibrillation ([place]) 11/22/2016  . Peripheral neuropathy 11/22/2016  . GERD (gastroesophageal reflux disease) 11/22/2016  . Abdominal pain 11/18/2016  . 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 Parasomnia 11/18/2016    Past Surgical History:  Procedure Laterality Date  . ABDOMINAL HYSTERECTOMY    . APPENDECTOMY    . ARTHROPLASTY Right 06/02/2017   RIGHT PATELLA-FEMORAL ARTHROPLASTY, with placement of a cemented 29 mm Stryker asymmetric triathlon patellar button, revision of the tibial polyethylene bearing.  Marland Kitchen BIOPSY  05/06/2018   Procedure: BIOPSY;  Surgeon: Gatha Mayer, MD;  Location: [facility];  Service: Endoscopy;;  . CARPAL TUNNEL RELEASE Bilateral   . CATARACT EXTRACTION W/ INTRAOCULAR LENS  IMPLANT, BILATERAL Bilateral   . CHOLECYSTECTOMY OPEN    . COLONOSCOPY WITH PROPOFOL N/A 10/30/2018   Procedure: COLONOSCOPY WITH PROPOFOL;  Surgeon: Doran Stabler, MD;  Location: [facility];  Service: Gastroenterology;  Laterality: N/A;  . DILATION AND CURETTAGE OF UTERUS    . ESOPHAGOGASTRODUODENOSCOPY (EGD) WITH PROPOFOL N/A 05/06/2018   Procedure: ESOPHAGOGASTRODUODENOSCOPY (EGD) WITH PROPOFOL;  Surgeon: Gatha Mayer, MD;  Location: [facility];  Service: Endoscopy;  Laterality: N/A;  . ESOPHAGOGASTRODUODENOSCOPY (EGD) WITH PROPOFOL N/A 10/30/2018   Procedure: ESOPHAGOGASTRODUODENOSCOPY (EGD) WITH PROPOFOL;  Surgeon: Doran Stabler, MD;  Location: [facility];  Service:  Gastroenterology;  Laterality: N/A;  . FOOT FUSION Right   . INGUINAL HERNIA REPAIR Right X 2  . INSERT / REPLACE / REMOVE PACEMAKER  2017   medtronic  . JOINT REPLACEMENT    . LAPAROTOMY N/A 11/21/2016   Procedure: EXPLORATORY LAPAROTOMY, REVISION OF  GASTROJEJUNOSTOMY, ESOPHAGOGASTROSCOPY;  Surgeon: Clovis Riley, MD;  Location: [facility];  Service: General;  Laterality: N/A;  . LEFT ATRIAL APPENDAGE OCCLUSION  2018   placed  via groin  . LUMBAR FUSION  X 2  . PATELLA-FEMORAL ARTHROPLASTY Right 06/02/2017   Procedure: RIGHT PATELLA-FEMORAL ARTHROPLASTY;  Surgeon: Frederik Pear, MD;  Location: [facility];  Service: Orthopedics;  Laterality: Right;  . REVERSE SHOULDER ARTHROPLASTY Left    reverse shoulder replacement x2  . ROUX-EN-Y GASTRIC BYPASS  2005  . ROUX-EN-Y PROCEDURE  20017 X 2   "revisions"  . TONSILLECTOMY AND ADENOIDECTOMY    . TOTAL HIP ARTHROPLASTY Right 02/24/2017   Procedure: TOTAL HIP ARTHROPLASTY;  Surgeon: Earlie Server, MD;  Location: [facility];  Service: Orthopedics;  Laterality: Right;  . TOTAL KNEE ARTHROPLASTY Bilateral   .  TOTAL SHOULDER ARTHROPLASTY Bilateral   . TUBAL LIGATION       OB History   No obstetric history on file.     Family History  Problem Relation Age of Onset  . Heart disease Mother   . Liver disease Father   . Ulcers Father   . Prostate cancer Brother   . Colon cancer Neg Hx     Social History   Tobacco Use  . Smoking status: Never Smoker  . Smokeless tobacco: Never Used  Substance Use Topics  . Alcohol use: No    Comment: very rare  . Drug use: No    Home Medications Prior to Admission medications   Medication Sig Start Date End Date Taking? Authorizing Provider  acetaminophen (TYLENOL) 500 MG tablet Take 1,000 mg every 6 (six) hours as needed by mouth (pain).     [provider]  calcium citrate (CALCITRATE) 950 MG tablet     [provider]  cholecalciferol (VITAMIN D3) 25 MCG (1000 UNIT) tablet Take 1  tablet (1,000 Units total) by mouth daily. 03/22/19   Nuala Alpha, DO  famotidine (PEPCID) 40 MG tablet Take 1 tablet (40 mg total) by mouth at bedtime. 08/29/18   Nuala Alpha, DO  furosemide (LASIX) 20 MG tablet Take 1 tablet (20 mg total) by mouth as needed for edema. 01/17/19 04/17/19  Patwardhan, Reynold Bowen, MD  gabapentin (NEURONTIN) 300 MG capsule TAKE 1 CAPSULE BY MOUTH THREE TIMES A DAY 04/04/2019   Lockamy, Timothy, DO  lidocaine-prilocaine (EMLA) cream Apply 1 application topically as needed. Apply to the rectum as needed every 3 hours 04/01/2019   Nuala Alpha, DO  metoprolol tartrate (LOPRESSOR) 100 MG tablet Take 100 mg by mouth 2 (two) times daily.    [provider]  pantoprazole (PROTONIX) 40 MG tablet Take 2 tablets (80 mg total) by mouth daily. Patient taking differently: Take 40 mg by mouth 2 (two) times daily.  12/11/18   Martyn Malay, MD  rOPINIRole (REQUIP) 0.5 MG tablet Take 1 tablet (0.5 mg total) by mouth 3 (three) times daily. 03/07/19   Nuala Alpha, DO  sertraline (ZOLOFT) 50 MG tablet Take 1 tablet (50 mg total) by mouth daily. 03/05/19   Nuala Alpha, DO  sucralfate (CARAFATE) 1 g tablet Take 1 tablet (1 g total) by mouth every 6 (six) hours. 08/29/18   Nuala Alpha, DO  traMADol (ULTRAM) 50 MG tablet Take 1 tablet (50 mg total) by mouth every 8 (eight) hours as needed for up to 7 days. 03/13/2019 04/12/19  Nuala Alpha, DO    Allergies    Demerol [meperidine] and Nsaids  Review of Systems   Review of Systems  Unable to perform ROS: Acuity of condition    Physical Exam Updated Vital Signs BP 117/66   Pulse 60   Resp (!) 24   SpO2 99%   Physical Exam Vitals and nursing note reviewed.  Constitutional:      General: She is not in acute distress.    Appearance: She is well-developed.     Comments: CPR in progress  Patient is intubated with ventilations occurring with BVM  HENT:     Head: Normocephalic.     Comments: Superficial  laceration of the left temple Eyes:     Conjunctiva/sclera: Conjunctivae normal.  Neck:     Comments: Cervical collar in place Cardiovascular:     Rate and Rhythm: Normal rate.     Comments: Initial rhythm is paced with out pulses.  ROSC was obtained after 5 minutes of CPR in the ED Pulmonary:     Effort: No respiratory distress.     Comments: Intubated Abdominal:     General: There is no distension.     Palpations: Abdomen is soft.     Tenderness: There is no abdominal tenderness.  Musculoskeletal:        General: No deformity.  Skin:    Comments: Skin is mottled  Neurological:     Comments: Unresponsive, intubated     ED Results / Procedures / Treatments   Labs (all labs ordered are listed, but only abnormal results are displayed) Labs Reviewed  POCT I-STAT 7, (LYTES, BLD GAS, ICA,H+H) - Abnormal; Notable for the following components:      Result Value   pH, Arterial 7.046 (*)    pO2, Arterial 311.0 (*)    Bicarbonate 12.0 (*)    TCO2 13 (*)    Acid-base deficit 18.0 (*)    Potassium 3.3 (*)    Calcium, Ion 1.11 (*)    All other components within normal limits  RESPIRATORY PANEL BY RT PCR (FLU A&B, COVID)  COMPREHENSIVE METABOLIC PANEL  LACTIC ACID, PLASMA  LACTIC ACID, PLASMA  CBC WITH DIFFERENTIAL/PLATELET  PROTIME-INR  CBC  BASIC METABOLIC PANEL  MAGNESIUM  TROPONIN I (HIGH SENSITIVITY)    EKG None  Radiology No results found.  Procedures Procedures (including critical care time) CRITICAL CARE Performed by: Valarie Merino   Total critical care time: 60 minutes  Critical care time was exclusive of separately billable procedures and treating other patients.  Critical care was necessary to treat or prevent imminent or life-threatening deterioration.  Critical care was time spent personally by me on the following activities: development of treatment plan with patient and/or surrogate as well as nursing, discussions with consultants, evaluation  of patient's response to treatment, examination of patient, obtaining history from patient or surrogate, ordering and performing treatments and interventions, ordering and review of laboratory studies, ordering and review of radiographic studies, pulse oximetry and re-evaluation of patient's condition.   Medications Ordered in ED Medications  chlorhexidine gluconate (MEDLINE KIT) (PERIDEX) 0.12 % solution 15 mL (has no administration in time range)  MEDLINE mouth rinse (has no administration in time range)  pantoprazole (PROTONIX) injection 40 mg (has no administration in time range)  fentaNYL (SUBLIMAZE) injection 25 mcg (has no administration in time range)  fentaNYL (SUBLIMAZE) injection 25-100 mcg (has no administration in time range)  bisacodyl (DULCOLAX) suppository 10 mg (has no administration in time range)  EPINEPHrine (ADRENALIN) 1 MG/10ML injection (1 Syringe Intravenous Given 04/03/2019 1720)  morphine 4 MG/ML injection 4 mg (4 mg Intravenous Given 03/17/2019 1754)    ED Course  I have reviewed the triage vital signs and the nursing notes.  Pertinent labs & imaging results that were available during my care of the patient were reviewed by me and considered in my medical decision making (see chart for details).    MDM Rules/Calculators/A&P                      MDM  Screen complete  Deanna Spencer was evaluated in Emergency Department on 03/25/2019 for the symptoms described in the history of present illness. She was evaluated in the context of the global COVID-19 pandemic, which necessitated consideration that the patient might be at risk for infection with the SARS-CoV-2 virus that causes COVID-19. Institutional protocols and algorithms that pertain to the evaluation of patients  at risk for COVID-19 are in a state of rapid change based on information released by regulatory bodies including the CDC and federal and state organizations. These policies and algorithms were followed during  the patient's care in the ED.  Patient presented in cardiac arrest.  Patient had approximately 45 minutes of resuscitation prior to arrival in the ED.  ROSC was obtained within about 5 minutes of arrival to the ED.  Case was discussed with patient's family.  Patient has a DNR.  Given her wishes will plan for likely admission with key focus on comfort.  Critical care services aware case and will evaluate for admission.   Final Clinical Impression(s) / ED Diagnoses Final diagnoses:  Cardiac arrest [facility])    Rx / DC Orders ED Discharge Orders    None       Valarie Merino, MD 03/12/2019 2156

## 2019-04-05 NOTE — Code Documentation (Signed)
Chaplin at bedside with family

## 2019-04-05 NOTE — Progress Notes (Signed)
RT NOTE:  Pt extubated per MD order. No complications. Currently on 2L [place].

## 2019-04-05 NOTE — Progress Notes (Signed)
   03/25/2019 1741  Clinical Encounter Type  Visited With Patient and family together  Visit Type ED;Code  Referral From Nurse  Consult/Referral To Chaplain  Spiritual Encounters  Spiritual Needs Prayer;Grief support;Other (Comment) ([facility])  Stress Factors  Patient Stress Factors Loss  Family Stress Factors Loss;Major life changes   Chaplain responded to consult for family support post CPR. Chaplain offered support with check-in visits over several hours, 1741-2100. Chaplain was able to have a [employer] from the area visit with the patient and family for the anointing of the sick. Chaplains remain available for support as needs arise.   Chaplain Resident, Evelene Croon, M Div [phone] on-call pager

## 2019-04-05 NOTE — ED Notes (Signed)
Pt to CT

## 2019-04-05 NOTE — Progress Notes (Addendum)
Patient now hypotensive s/p sedation after sitting up in CT scanner.  Getting NS bolus.   Clarified with daughter/ family and they do NOT want any vasopressor support.    Family ask to know results of labs/ CT imaging (still pending) and then likely will transition to comfort care based on patient's known wishes.    Signed out to night PCCM.    Kennieth Rad, MSN, AGACNP-BC [facility] 03/19/2019, 7:32 PM

## 2019-04-05 NOTE — Code Documentation (Signed)
Family at bedside.  Pt remains in a sinus brady rhythm rate 60.  Pt remains unresponsive on vent

## 2019-04-05 NOTE — Code Documentation (Signed)
Critical Care MD in to assess pt at this time

## 2019-04-05 NOTE — Code Documentation (Signed)
Pt to ED via [facility] after pt had a witnessed collapse at [employer].  On EMS arrival pt had agonal resp and lost pulses.  CPR started by EMS at 16:30 Pt then in a V-fib rhythm, Pt was shocked x's 3 and given Amiodarone 450mg . 16:57 PEA no pulse, CPR restarted > V-Tach pt shocked.  Pt was given a total of 6 Epi's prior to arrival to ED

## 2019-04-05 NOTE — Progress Notes (Signed)
PCCM interval progress note:  Discussed plan of care with multiple family members at the bedside.  They are all in agreement to transition to comfort care.  Palliative orders orders entered.   Otilio Carpen Jatavian Calica, PA-C

## 2019-04-05 NOTE — Progress Notes (Signed)
[facility] Progress Note Patient Name: Deanna Spencer DOB: 1940-02-20 MRN: [id number]   Date of Service  03/17/2019  HPI/Events of Note  Family wants to withdraw care. I am not able to camera into the room due to malfunction of Elink cameras. Pt is s/p out of hospital cardiac arrest.  eICU Interventions  PCCM bedside crew requested to go to the room and talk to the family. New Patient Evaluation completed.        Frederik Pear 03/27/2019, 9:41 PM

## 2019-04-06 ENCOUNTER — Encounter (HOSPITAL_COMMUNITY): Payer: Self-pay | Admitting: Pulmonary Disease

## 2019-04-06 DIAGNOSIS — I495 Sick sinus syndrome: Secondary | ICD-10-CM

## 2019-04-06 DIAGNOSIS — Z8679 Personal history of other diseases of the circulatory system: Secondary | ICD-10-CM

## 2019-04-06 DIAGNOSIS — Z95818 Presence of other cardiac implants and grafts: Secondary | ICD-10-CM

## 2019-04-06 DIAGNOSIS — I4901 Ventricular fibrillation: Secondary | ICD-10-CM

## 2019-04-06 LAB — MRSA PCR SCREENING: MRSA by PCR: NEGATIVE

## 2019-04-06 MED ORDER — CHLORHEXIDINE GLUCONATE CLOTH 2 % EX PADS
6.0000 | MEDICATED_PAD | Freq: Every day | CUTANEOUS | Status: DC
  Administered 2019-04-06 – 2019-04-08 (×3): 6 via TOPICAL

## 2019-04-06 MED ORDER — ORAL CARE MOUTH RINSE
15.0000 mL | Freq: Two times a day (BID) | OROMUCOSAL | Status: DC
  Administered 2019-04-06 – 2019-04-09 (×7): 15 mL via OROMUCOSAL

## 2019-04-06 MED ORDER — LIDOCAINE 5 % EX PTCH
1.0000 | MEDICATED_PATCH | CUTANEOUS | Status: DC
  Administered 2019-04-06 – 2019-04-08 (×3): 1 via TRANSDERMAL
  Filled 2019-04-06 (×4): qty 1

## 2019-04-06 MED ORDER — ACETAMINOPHEN 10 MG/ML IV SOLN
1000.0000 mg | Freq: Four times a day (QID) | INTRAVENOUS | Status: DC
  Administered 2019-04-06 (×3): 1000 mg via INTRAVENOUS
  Filled 2019-04-06 (×3): qty 100

## 2019-04-06 MED FILL — Medication: Qty: 1 | Status: AC

## 2019-04-06 NOTE — Evaluation (Signed)
Clinical/Bedside Swallow Evaluation Patient Details  Name: Deanna Spencer MRN: [id number] Date of Birth: [birth year]  Today's Date: 04/06/2019 Time: SLP Start Time (ACUTE ONLY): 1415 SLP Stop Time (ACUTE ONLY): 1441 SLP Time Calculation (min) (ACUTE ONLY): 26 min  Past Medical History:  Past Medical History:  Diagnosis Date  . AAA (abdominal aortic aneurysm) without rupture ([place]) 12/21/2016   Stable 3cm 4/20, needs f/u imaging ~4/23  . Anemia   . Atrial fibrillation ([place]) 11/22/2016  . Childhood asthma    connected to allergies  . Chronic lower back pain   . Chronic systolic heart failure ([place]) 12/14/2016  . Diplopia 09/27/2017  . Diverticulosis   . Duodenal ulcer   . Dysrhythmia    a fib  . GERD (gastroesophageal reflux disease)   . GI bleed    "have had both upper and lower GIB"  . Gout   . Heart attack ([employer]) 2017  . Heart murmur   . History of anastomosis and bypass hemorrhage 05/06/2018  . History of blood transfusion 2017-2018   "related to bleeding ulcers; 47 units PRBC; 2U plasma" (11/23/2016)  . History of duodenal ulcer 11/18/2016  . History of heart attack 11/18/2016  . History of hiatal hernia   . Hx of blood clots    in heart  . IBS (irritable bowel syndrome)   . Liver lesion 08/07/2017  . Neuropathy   . Osteoarthritis of right knee 05/30/2017  . Osteoporosis   . Other chronic pain 11/18/2016  . Parasomnia 11/18/2016  . Perforated viscus 11/22/2016  . Peripheral neuropathy   . Presence of permanent cardiac pacemaker 10/2015   medtronic; Serial # [id number]  . Presence of Watchman left atrial appendage closure device   . Primary osteoarthritis of left hip 02/24/2017  . 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 Primary osteoarthritis of right knee 06/02/2017  . Raynaud disease   . Rheumatoid arthritis involving both hands ([place]) 09/27/2017  . S/P exploratory laparotomy 11/22/2016  . Seasonal allergies 08/07/2017  . 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 SSS (sick sinus  syndrome) ([place])    a. s/p Medtronic PPM placement in 2017  . Stomach ulcer   . Vision changes 08/07/2017   Past Surgical History:  Past Surgical History:  Procedure Laterality Date  . ABDOMINAL HYSTERECTOMY    . APPENDECTOMY    . ARTHROPLASTY Right 06/02/2017   RIGHT PATELLA-FEMORAL ARTHROPLASTY, with placement of a cemented 29 mm Stryker asymmetric triathlon patellar button, revision of the tibial polyethylene bearing.  Marland Kitchen BIOPSY  05/06/2018   Procedure: BIOPSY;  Surgeon: Gatha Mayer, MD;  Location: [facility];  Service: Endoscopy;;  . CARPAL TUNNEL RELEASE Bilateral   . CATARACT EXTRACTION W/ INTRAOCULAR LENS  IMPLANT, BILATERAL Bilateral   . CHOLECYSTECTOMY OPEN    . COLONOSCOPY WITH PROPOFOL N/A 10/30/2018   Procedure: COLONOSCOPY WITH PROPOFOL;  Surgeon: Doran Stabler, MD;  Location: [facility];  Service: Gastroenterology;  Laterality: N/A;  . DILATION AND CURETTAGE OF UTERUS    . ESOPHAGOGASTRODUODENOSCOPY (EGD) WITH PROPOFOL N/A 05/06/2018   Procedure: ESOPHAGOGASTRODUODENOSCOPY (EGD) WITH PROPOFOL;  Surgeon: Gatha Mayer, MD;  Location: [facility];  Service: Endoscopy;  Laterality: N/A;  . ESOPHAGOGASTRODUODENOSCOPY (EGD) WITH PROPOFOL N/A 10/30/2018   Procedure: ESOPHAGOGASTRODUODENOSCOPY (EGD) WITH PROPOFOL;  Surgeon: Doran Stabler, MD;  Location: [facility];  Service: Gastroenterology;  Laterality: N/A;  . FOOT FUSION Right   . INGUINAL HERNIA REPAIR Right X 2  . INSERT / REPLACE / REMOVE PACEMAKER  2017   medtronic  . JOINT REPLACEMENT    . LAPAROTOMY N/A 11/21/2016   Procedure: EXPLORATORY LAPAROTOMY, REVISION OF  GASTROJEJUNOSTOMY, ESOPHAGOGASTROSCOPY;  Surgeon: Clovis Riley, MD;  Location: [place];  Service: General;  Laterality: N/A;  . LEFT ATRIAL APPENDAGE OCCLUSION  2018   placed  via groin  . LUMBAR FUSION  X 2  . PATELLA-FEMORAL ARTHROPLASTY Right 06/02/2017   Procedure: RIGHT PATELLA-FEMORAL ARTHROPLASTY;  Surgeon: Frederik Pear, MD;   Location: [place];  Service: Orthopedics;  Laterality: Right;  . REVERSE SHOULDER ARTHROPLASTY Left    reverse shoulder replacement x2  . ROUX-EN-Y GASTRIC BYPASS  2005  . ROUX-EN-Y PROCEDURE  20017 X 2   "revisions"  . TONSILLECTOMY AND ADENOIDECTOMY    . TOTAL HIP ARTHROPLASTY Right 02/24/2017   Procedure: TOTAL HIP ARTHROPLASTY;  Surgeon: Earlie Server, MD;  Location: [facility];  Service: Orthopedics;  Laterality: Right;  . TOTAL KNEE ARTHROPLASTY Bilateral   . TOTAL SHOULDER ARTHROPLASTY Bilateral   . TUBAL LIGATION     HPI:  79 yo female was in usual state of health and out shopping at [place] on 03/29/2019. Had witnessed arrest.  On arrival by EMS had agonal respiratory efforts.  Noted to be in VF.  Shocked x 3, and given amiodarone.  Then had PEA and then VT with repeated shock.  Had ROSC after 45 minutes.  On arrival by family medical team informed pt had DNR status.  They opted to continue supportive care to see if she showed any signs of improvement; extubated 03/13/2019; BSE generated.  Assessment / Plan / Recommendation Clinical Impression  Pt presents with oropharyngeal dysphagia characterized by oral holding d/t decreased mentation/alertness level and delay in the initation of the swallow with delayed throat clearing/cough intermittently with small sips of thin/puree; ice chips appear to be WNL; no solids attempted; family (daughter) present and educated re: aspiration risk/swallowing precautions to utilize until pt increases alertness level/cognition; recommend ice chips/sips of water only until pt mentation improves.  ST will continue to f/u while in acute setting; thank you for this consult. SLP Visit Diagnosis: Dysphagia, oropharyngeal phase (R13.12)    Aspiration Risk  Moderate aspiration risk    Diet Recommendation   NPO; ice chips allowed  Medication Administration: Via alternative means    Other  Recommendations Oral Care Recommendations: Oral care QID;Oral care prior to ice  chip/H20   Follow up Recommendations Other (comment)(TBD)      Frequency and Duration min 2x/week  1 week       Prognosis Prognosis for Safe Diet Advancement: Good      Swallow Study   General Date of Onset: 03/15/2019 HPI: 79 yo female was in usual state of health and out shopping at [place].  Had witnessed arrest.  On arrival by EMS had agonal respiratory efforts.  Noted to be in VF.  Shocked x 3, and given amiodarone.  Then had PEA and then VT with repeated shock.  Had ROSC after 45 minutes.  On arrival by family medical team informed pt had DNR status.  They opted to continue supportive care to see if she showed any signs of improvement. Type of Study: Bedside Swallow Evaluation Previous Swallow Assessment: (n/a) Diet Prior to this Study: NPO Temperature Spikes Noted: No Respiratory Status: Nasal cannula History of Recent Intubation: Yes Length of Intubations (days): (1) Date extubated: 03/22/2019 Behavior/Cognition: Cooperative;Lethargic/Drowsy;Pleasant mood;Requires cueing Oral Cavity Assessment: Within Functional Limits Oral Care Completed by SLP: Recent completion by staff  Oral Cavity - Dentition: Adequate natural dentition Self-Feeding Abilities: Needs assist Patient Positioning: Upright in bed Baseline Vocal Quality: Low vocal intensity Volitional Cough: Strong Volitional Swallow: Able to elicit    Oral/Motor/Sensory Function Overall Oral Motor/Sensory Function: Within functional limits   Ice Chips Ice chips: Within functional limits Presentation: Spoon   Thin Liquid Thin Liquid: Impaired Presentation: Cup;Straw Pharyngeal  Phase Impairments: Suspected delayed Swallow;Throat Clearing - Delayed    Nectar Thick Nectar Thick Liquid: Not tested   Honey Thick Honey Thick Liquid: Not tested   Puree Puree: Impaired Presentation: Spoon Oral Phase Functional Implications: Oral holding Pharyngeal Phase Impairments: Suspected delayed Swallow;Throat Clearing - Delayed    Solid     Solid: Not tested      Elvina Sidle, M.S., CCC-SLP 04/06/2019,3:25 PM

## 2019-04-06 NOTE — Plan of Care (Signed)

## 2019-04-06 NOTE — Progress Notes (Signed)
Orthopedic Tech Progress Note Patient Details:  Deanna Spencer 08-02-1940 [id number]  Ortho Devices Type of Ortho Device: Soft collar Ortho Device/Splint Interventions: Application   Post Interventions Patient Tolerated: Well Instructions Provided: Care of device   [name] 04/06/2019, 2:37 PM

## 2019-04-06 NOTE — Progress Notes (Signed)
eLink Physician-Brief Progress Note Patient Name: Deanna Spencer DOB: [birth year] MRN: [id number]   Date of Service  04/06/2019  HPI/Events of Note  Pt switched from comfort measures to DNR with active management of medical issues, she has urinary retention, and needs morning labs.  eICU Interventions  In/ Out bladder catheterization PRN ordered, a.m. labs ordered.        Kerry Kass Lora Chavers 04/06/2019, 9:50 PM

## 2019-04-06 NOTE — Progress Notes (Signed)
NAME:  Deanna Spencer, MRN:  [id number], DOB:  [birth year], LOS: 1 ADMISSION DATE:  03/21/2019, CONSULTATION DATE:  03/29/2019 REFERRING MD:  Dr. Garlan Fillers, ER, CHIEF COMPLAINT:  Cardiac arrest   Brief History   79 yo female had witnessed arrest.  Noted to be in VF.  ROSC after 45 minutes.  Family opted to continue short term trial of supportive care, but DNR otherwise.  History of present illness   79 yo female was in usual state of health and out shopping at [place].  Had witnessed arrest.  On arrival by EMS had agonal respiratory efforts.  Noted to be in VF.  Shocked x 3, and given amiodarone.  Then had PEA and then VT with repeated shock.  Had ROSC after 45 minutes.  On arrival by family medical team informed pt had DNR status.  They opted to continue supportive care to see if she showed any signs of improvement.  Past Medical History  PUD, Sick sinus syndrome, Bladder spasm, RA, Allergies, OA, Neuropathy, Osteoporosis, IBS, Hiatal hernia, Gout, GERD, Diverticulosis, Systolic CHF, A fib  Significant Hospital Events   3/26 Admit  Consults:  PCCM  Procedures:  ETT 3/26 >>  Significant Diagnostic Tests:  CT head -   Micro Data:  SARS CoV2 3/26 >>  Antimicrobials:   None.  Interim history/subjective:  Began to wake up last night. Was on comfort care and morphine stopped this morning. Doesn't recall event. Denies chest pain.  Objective   Blood pressure (!) 96/51, pulse 75, temperature 98.4 F (36.9 C), temperature source Oral, resp. rate 14, SpO2 98 %.    Vent Mode: PRVC FiO2 (%):  [50 %-100 %] 50 % Set Rate:  [20 bmp] 20 bmp Vt Set:  [340 mL] 340 mL PEEP:  [5 cmH20] 5 cmH20   Intake/Output Summary (Last 24 hours) at 04/06/2019 0849 Last data filed at 04/06/2019 0400 Gross per 24 hour  Intake --  Output 0 ml  Net 0 ml   There were no vitals filed for this visit.  Examination:  General - hematoma on side of Lt upper forehead Eyes - pupils reactive, roving eye  movements. Inatttentive.  ENT - mucous membranes moist.  Pain to palpation of C-spine.  Cervical collar remains in place. Cardiac - Split S2 2/6 HSM. No edema. Chest - normal chest excursion. Chest clear.  Abdomen - soft, non tender, + bowel sounds Extremities - no cyanosis, clubbing, or edema Skin - no rashes Neuro - moves all limbs to command.   Resolved Hospital Problem list   Acute hypoxic respiratory failure in setting of cardiac arrest.  Assessment & Plan:   VF cardiac arrest. Permanent A fib s/p Watchman device. Chronic systolic CHF with ischemic CM. - monitor hemodynamics - hold outpt lasix, restart lopressor - [place] CV to see.   Acute encephalopathy due to combination of hypoxic ischemic encephalopathy, possible cerebral perfusion, residual morphine effective. Hx of chronic pain. -Stop IV morphine -Hold all sedatives -Nonnarcotic pain medications -Continue neurochecks as remains at risk for evolving cerebral contusion. -Given the pace of improvement possible that she may make near complete neurological recovery.  Possible cervical injury -MRI to rule out ligamentous injury.  Hx of PUD with GI bleeding. - protonix  Goals of care. - updated pt's family at bedside - agreed to DNR status if she has recurrent cardiac arrest - want to give short term trial of supportive therapy before deciding whether to transition to comfort measures   Daily  Goals Checklist  Pain/Anxiety/Delirium protocol (if indicated): Acetaminophen and topical lidocaine for pain. VAP protocol (if indicated): Not intubated Respiratory support goals: Progressive mobilization.  Wean oxygen as tolerated Blood pressure target: Keep systolic blood pressure less than 180 DVT prophylaxis: Heparin twice daily Nutrition Status: High nutritional risk.  Swallow evaluation once more awake.   GI prophylaxis: Protonix Fluid status goals: Allow for euvolemia Urinary catheter: External catheter Central  line: PIV only. Glucose control: Phase 1 glycemic protocol Mobility/therapy needs: Up with assistance once C-spine cleared Antibiotic de-escalation: No antibiotics Home medication reconciliation: Resume cardiac medication.  Avoid all sedatives for now. Daily labs: CBC, BMP Code Status: DNR Family Communication: Daughters were updated at bedside. Disposition: ICU   Labs   CBC: Recent Labs  Lab 03/25/2019 1750  HGB 12.6  HCT [id number]    Basic Metabolic Panel: Recent Labs  Lab 03/26/2019 1750  NA 142  K 3.3*   GFR: CrCl cannot be calculated (Patient's most recent lab result is older than the maximum 21 days allowed.). No results for input(s): PROCALCITON, WBC, LATICACIDVEN in the last 168 hours.  Liver Function Tests: No results for input(s): AST, ALT, ALKPHOS, BILITOT, PROT, ALBUMIN in the last 168 hours. No results for input(s): LIPASE, AMYLASE in the last 168 hours. No results for input(s): AMMONIA in the last 168 hours.  ABG    Component Value Date/Time   PHART 7.046 (LL) 03/17/2019 1750   PCO2ART 43.7 03/19/2019 1750   PO2ART 311.0 (H) 03/31/2019 1750   HCO3 12.0 (L) 03/14/2019 1750   TCO2 13 (L) 03/15/2019 1750   ACIDBASEDEF 18.0 (H) 03/11/2019 1750   O2SAT 100.0 04/06/2019 1750     Coagulation Profile: No results for input(s): INR, PROTIME in the last 168 hours.  Cardiac Enzymes: No results for input(s): CKTOTAL, CKMB, CKMBINDEX, TROPONINI in the last 168 hours.  HbA1C: No results found for: HGBA1C  CBG: No results for input(s): GLUCAP in the last 168 hours.   Kipp Brood, MD [facility] ICU Physician [place]  Pager: [phone] Mobile: [phone] After hours: [phone].  04/06/2019, 9:35 AM

## 2019-04-06 NOTE — Progress Notes (Signed)
Per available records patient has a Medtronic Advisa pacemaker that is unsafe for MRI. RN notifed that MRI department unable to perform exam.

## 2019-04-06 NOTE — Consult Note (Signed)
CARDIOLOGY CONSULT NOTE  Patient ID: Deanna Spencer MRN: [id number] DOB/AGE: Dec 09, 1940 79 y.o.  Admit date: 03/30/2019 Referring Physician: Chesley Mires, MD Primary Physician:  Deanna Alpha, DO Outpatient Cardiologist: Dr. Vernell Spencer  Inpatient Cardiologist: Dr. Rex Spencer  Reason for Consultation:  VFib Cardiac Arrest.   HPI:  Deanna Spencer is a 79 y.o. female who presents with a chief complaint of " cardiac arrest."  She has a complex past medical history which includes: AAA without rupture, permanent atrialfibrillation,s/p Watchman LAAC device, h/o recurrent GI bleeding with ulcers following gastric bypass surgery, h/o multiple blood transfusions, exploratory laporotomy with revision of gastrojejunostomy, esophagogastroscopy (11/2016), rheumatoid arthritis, multiple orthopedic issues.   Patient now presents to the hospital status post cardiac arrest.  Upon review of electronic medical records the following information was obtained.  Patient had a witnessed cardiac arrest while shopping at [place] she fell to the floor hitting the head and was noted not to have a pulse by bystander.  Patient received BLS almost immediately by the bystander until EMS arrived.  When EMS arrived patient was noted to be in an unresponsive state.  EMS reported 45 minutes of nearly continuous CPR prior to arrival.  The underlying rhythm was noted to be ventricular fibrillation for which she was defibrillated multiple times.  She did receive amiodarone and multiple doses of epi prior to arrival as per documentation.  Patient was intubated on arrival.  She is also noted to have PEA and then VT with repeated shock.  Patient achieved ROSC after 45 minutes of resuscitation.  Patient was transferred to CVICU for further management under intensivist and cardiology was consulted.  At the time of evaluation patient is awake alert and able to provide a portion of her HPI.  She is accompanied by her daughter  Deanna Spencer.  Patient denies any chest pain prior to the cardiac arrest or currently.  She does have some tenderness along the lateral chest wall most likely secondary to resuscitation efforts.  Patient denies any cardiac symptoms of chest pain or shortness of breath with effort related activities.  No orthopnea, paroxysmal nocturnal dyspnea or lower extremity swelling.  No prodromal symptoms prior to ventricular fibrillation arrest per patient.  Patient's pacemaker was interrogated and report reviewed and summarized below.  Her underlying rhythm is atrial fibrillation and had 1 episode of ventricular fibrillation yesterday.  ALLERGIES: Allergies  Allergen Reactions  . Demerol [Meperidine] Anaphylaxis  . Nsaids     Patient has had MULTIPLE revisions of her gastrojejunostomy for marginal ulcers including recent perforation    PAST MEDICAL HISTORY: Past Medical History:  Diagnosis Date  . AAA (abdominal aortic aneurysm) without rupture ([place]) 12/21/2016   Stable 3cm 4/20, needs f/u imaging ~4/23  . Anemia   . Atrial fibrillation ([place]) 11/22/2016  . Childhood asthma    connected to allergies  . Chronic lower back pain   . Chronic systolic heart failure ([place]) 12/14/2016  . Diplopia 09/27/2017  . Diverticulosis   . Duodenal ulcer   . Dysrhythmia    a fib  . GERD (gastroesophageal reflux disease)   . GI bleed    "have had both upper and lower GIB"  . Gout   . Heart attack ([place]) 2017  . Heart murmur   . History of anastomosis and bypass hemorrhage 05/06/2018  . History of blood transfusion 2017-2018   "related to bleeding ulcers; 47 units PRBC; 2U plasma" (11/23/2016)  . History of duodenal ulcer 11/18/2016  . History of  heart attack 11/18/2016  . History of hiatal hernia   . Hx of blood clots    in heart  . IBS (irritable bowel syndrome)   . Liver lesion 08/07/2017  . Neuropathy   . Osteoarthritis of right knee 05/30/2017  . Osteoporosis   . Other chronic pain 11/18/2016  . Parasomnia  11/18/2016  . Perforated viscus 11/22/2016  . Peripheral neuropathy   . 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 Presence of Watchman left atrial appendage closure device   . Primary osteoarthritis of left hip 02/24/2017  . 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 Primary osteoarthritis of right knee 06/02/2017  . Raynaud disease   . Rheumatoid arthritis involving both hands ([place]) 09/27/2017  . S/P exploratory laparotomy 11/22/2016  . Seasonal allergies 08/07/2017  . 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 SSS (sick sinus syndrome) ([place])    a. s/p Medtronic PPM placement in 2017  . Stomach ulcer   . Vision changes 08/07/2017    PAST SURGICAL HISTORY: Past Surgical History:  Procedure Laterality Date  . ABDOMINAL HYSTERECTOMY    . APPENDECTOMY    . ARTHROPLASTY Right 06/02/2017   RIGHT PATELLA-FEMORAL ARTHROPLASTY, with placement of a cemented 29 mm Stryker asymmetric triathlon patellar button, revision of the tibial polyethylene bearing.  Marland Kitchen BIOPSY  05/06/2018   Procedure: BIOPSY;  Surgeon: Deanna Mayer, MD;  Location: [facility];  Service: Endoscopy;;  . CARPAL TUNNEL RELEASE Bilateral   . CATARACT EXTRACTION W/ INTRAOCULAR LENS  IMPLANT, BILATERAL Bilateral   . CHOLECYSTECTOMY OPEN    . COLONOSCOPY WITH PROPOFOL N/A 10/30/2018   Procedure: COLONOSCOPY WITH PROPOFOL;  Surgeon: Deanna Stabler, MD;  Location: [facility];  Service: Gastroenterology;  Laterality: N/A;  . DILATION AND CURETTAGE OF UTERUS    . ESOPHAGOGASTRODUODENOSCOPY (EGD) WITH PROPOFOL N/A 05/06/2018   Procedure: ESOPHAGOGASTRODUODENOSCOPY (EGD) WITH PROPOFOL;  Surgeon: Deanna Mayer, MD;  Location: [facility];  Service: Endoscopy;  Laterality: N/A;  . ESOPHAGOGASTRODUODENOSCOPY (EGD) WITH PROPOFOL N/A 10/30/2018   Procedure: ESOPHAGOGASTRODUODENOSCOPY (EGD) WITH PROPOFOL;  Surgeon: Deanna Stabler, MD;  Location: [facility];  Service: Gastroenterology;   Laterality: N/A;  . FOOT FUSION Right   . INGUINAL HERNIA REPAIR Right X 2  . INSERT / REPLACE / REMOVE PACEMAKER  2017   medtronic  . JOINT REPLACEMENT    . LAPAROTOMY N/A 11/21/2016   Procedure: EXPLORATORY LAPAROTOMY, REVISION OF  GASTROJEJUNOSTOMY, ESOPHAGOGASTROSCOPY;  Surgeon: Clovis Riley, MD;  Location: [facility];  Service: General;  Laterality: N/A;  . LEFT ATRIAL APPENDAGE OCCLUSION  2018   placed  via groin  . LUMBAR FUSION  X 2  . PATELLA-FEMORAL ARTHROPLASTY Right 06/02/2017   Procedure: RIGHT PATELLA-FEMORAL ARTHROPLASTY;  Surgeon: Frederik Pear, MD;  Location: [facility];  Service: Orthopedics;  Laterality: Right;  . REVERSE SHOULDER ARTHROPLASTY Left    reverse shoulder replacement x2  . ROUX-EN-Y GASTRIC BYPASS  2005  . ROUX-EN-Y PROCEDURE  20017 X 2   "revisions"  . TONSILLECTOMY AND ADENOIDECTOMY    . TOTAL HIP ARTHROPLASTY Right 02/24/2017   Procedure: TOTAL HIP ARTHROPLASTY;  Surgeon: Earlie Server, MD;  Location: [facility];  Service: Orthopedics;  Laterality: Right;  . TOTAL KNEE ARTHROPLASTY Bilateral   . TOTAL SHOULDER ARTHROPLASTY Bilateral   . TUBAL LIGATION      FAMILY HISTORY: The patient family history includes Heart disease in her mother; Liver disease in her father; Prostate cancer in her brother;  Ulcers in her father.   SOCIAL HISTORY:  The patient  reports that she has never smoked. She has never used smokeless tobacco. She reports that she does not drink alcohol or use drugs.  MEDICATIONS: Current Outpatient Medications  Medication Instructions  . acetaminophen (TYLENOL) 1,000 mg, Oral, Every 6 hours PRN  . calcium citrate (CALCITRATE) 950 MG tablet No dose, route, or frequency recorded.  . cholecalciferol (VITAMIN D3) 1,000 Units, Oral, Daily  . famotidine (PEPCID) 40 mg, Oral, Daily at bedtime  . furosemide (LASIX) 20 mg, Oral, As needed  . gabapentin (NEURONTIN) 300 MG capsule TAKE 1 CAPSULE BY MOUTH THREE TIMES A DAY  . lidocaine-prilocaine (EMLA)  cream 1 application, Topical, As needed, Apply to the rectum as needed every 3 hours  . metoprolol succinate (TOPROL-XL) 100 mg, Oral, Daily at bedtime  . metoprolol tartrate (LOPRESSOR) 100 mg, Oral, 2 times daily  . pantoprazole (PROTONIX) 80 mg, Oral, Daily  . rOPINIRole (REQUIP) 0.5 mg, Oral, 3 times daily  . sertraline (ZOLOFT) 50 mg, Oral, Daily  . sucralfate (CARAFATE) 1 g, Oral, Every 6 hours  . traMADol (ULTRAM) 50 mg, Oral, Every 8 hours PRN    14 ORGAN REVIEW OF SYSTEMS: Review of Systems  Constitutional: Negative for chills and fever.  HENT: Positive for congestion. Negative for ear discharge, ear pain, hearing loss, nosebleeds and tinnitus.   Eyes: Positive for pain (due to recent fall. ecchymosis around the left orbital region. ). Negative for double vision and discharge.  Respiratory: Negative for cough, sputum production, shortness of breath and wheezing.   Cardiovascular: Negative for chest pain, palpitations, orthopnea, claudication, leg swelling and PND.  Gastrointestinal: Negative for abdominal pain, blood in stool, constipation, diarrhea, melena, nausea and vomiting.  Genitourinary: Negative for dysuria.       Decreased urine output.   Musculoskeletal: Positive for back pain, falls, joint pain, myalgias and neck pain.  Skin: Negative for itching and rash.  Neurological: Negative for dizziness, focal weakness, weakness and headaches.  Psychiatric/Behavioral: Negative for depression and suicidal ideas.   PHYSICAL EXAM: Vitals with BMI 04/06/2019 04/06/2019 04/06/2019  Height - - -  Weight - - -  BMI - - -  Systolic [id number]  Diastolic 68 59 68  Pulse 71 62 64   CONSTITUTIONAL: Well-developed and well-nourished. No acute distress.  SKIN: Skin is warm and dry. No rash noted. No cyanosis. No pallor. No jaundice HEAD: Normocephalic. EYES: No scleral icterus.  Ecchymosis noted over the left orbital region. MOUTH/THROAT: Dry oral membranes.  NECK: Soft cervical  collar in place. LYMPHATIC: No visible cervical adenopathy.  CHEST Normal respiratory effort. No intercostal retractions.  Pacemaker noted in the left infraclavicular region, site is clean dry and intact. LUNGS: Clear to auscultation laterally.  Due to recent fall and chest compressions unable to auscultate posteriorly due to difficulty in sitting upright.  CARDIOVASCULAR: Irregularly irregular, positive [id number], holosystolic murmur heard at the apex.  No gallops or rubs. ABDOMINAL: Nonobese, soft, nontender not distended, no apparent ascites.  EXTREMITIES: No peripheral edema.  Warm to touch bilaterally. HEMATOLOGIC: bruising noted over the left orbital region, left frontal/temporal region. NEUROLOGIC: Oriented to person, place, and time. Nonfocal. Normal muscle tone.  PSYCHIATRIC: Normal mood and affect. Normal behavior. Cooperative  RADIOLOGY: CT Head Wo Contrast  Addendum Date: 03/17/2019   ADDENDUM REPORT: 03/19/2019 20:02 ADDENDUM: These results were called by telephone at the time of interpretation on 03/21/2019 at 8:02 pm to provider [facility] , who verbally  acknowledged these results. Electronically Signed   By: Kellie Simmering DO   On: 03/15/2019 20:02   Result Date: 03/30/2019 CLINICAL DATA:  Fall, head trauma, cardiac arrest; head trauma, headache. Spine fracture, cervical, traumatic. Additional history provided: EXAM: CT HEAD WITHOUT CONTRAST CT CERVICAL SPINE WITHOUT CONTRAST TECHNIQUE: Multidetector CT imaging of the head and cervical spine was performed following the standard protocol without intravenous contrast. Multiplanar CT image reconstructions of the cervical spine were also generated. COMPARISON:  No pertinent prior studies available for comparison. FINDINGS: CT HEAD FINDINGS Brain: There is no evidence of acute intracranial hemorrhage. There is a 2.9 cm focus of cortical/subcortical hypodensity within the mid to anterior left frontal lobe and left frontal operculum (series 3,  images 21-25). This may reflect an acute ischemic infarct or non hemorrhagic parenchymal contusion. A small cortically based infarct within the mid to posterior right frontal lobe is age-indeterminate, although favored chronic (series 3, image 25). There is no evidence of intracranial mass. No midline shift or extra-axial fluid collection. Mild ill-defined hypoattenuation within the cerebral white matter is nonspecific, but consistent with chronic small vessel ischemic disease. Mild generalized parenchymal atrophy. Vascular: No hyperdense vessel. Skull: Normal. Negative for fracture or focal lesion. Sinuses/Orbits: Visualized orbits demonstrate no acute abnormality. Mild ethmoid and left maxillary sinus mucosal thickening. No significant mastoid effusion. Other: Left periorbital soft tissue hematoma. CT CERVICAL SPINE FINDINGS Alignment: Cervical levocurvature. Trace C2-C3 grade 1 anterolisthesis. Trace C3-C4 retrolisthesis. 2 mm C4-C5 grade 1 anterolisthesis. Trace anterolisthesis at the C5-C6, C6-C7 and C7-T1 levels. Skull base and vertebrae: The basion-dental and atlanto-dental intervals are maintained.No evidence of acute fracture to the cervical spine. Soft tissues and spinal canal: No prevertebral fluid or swelling. No visible canal hematoma. Disc levels: Pannus formation/ligamentous hypertrophy at the C1-C2 articulation. Erosive changes within the posterior aspect of the dens which may reflect sequela of an inflammatory arthropathy. Cervical spondylosis. Most notably at C3-C4, there is advanced disc space narrowing with a posterior disc osteophyte complex and uncovertebral hypertrophy. Additionally, there is multilevel facet arthropathy. Upper chest: Streak artifact arising from a left-sided pacer device and left shoulder prosthesis limits evaluation of the lung apices. Interlobular septal thickening within the imaged lung apices may reflect edema. Other: There is an acute, displaced and subtly comminuted  fracture of the distal left clavicle (series 5, image 55) (series 7, image 47). Acute, nondisplaced fracture of the posterior left second rib (series 7, image 50). IMPRESSION: CT head: 1. 2.9 cm region of cortical/subcortical hypodensity within the left frontal lobe, which may reflect an acute infarct or non hemorrhagic parenchymal contusion. 2. Small cortically based infarct within the mid to posterior right frontal lobe, age indeterminate but favored chronic. 3. Left periorbital soft tissue hematoma. 4. Mild generalized parenchymal atrophy and chronic small vessel ischemic disease. CT cervical spine: 1. No evidence of acute fracture to the cervical spine. 2. Cervical spondylosis without high-grade bony spinal canal narrowing. 3. Multilevel spondylolisthesis. Most notably there is 2 mm C4-C5 grade 1 anterolisthesis. 4. Ligamentous hypertrophy/pannus formation at the C1-C2 articulation. Erosive changes within the posterior aspect of the dens may reflect sequela of an inflammatory arthropathy. 5. Acute, displaced and subtly comminuted fracture of the distal left clavicle. Consider dedicated radiographs of the left shoulder for further evaluation. 6. Acute nondisplaced fracture of the posterior left second rib. Consider thoracic CT to more completely evaluate for thoracic trauma. Electronically Signed: By: Kellie Simmering DO On: 03/29/2019 19:59   CT Cervical Spine Wo Contrast  Addendum  Date: 03/26/2019   ADDENDUM REPORT: 03/23/2019 20:02 ADDENDUM: These results were called by telephone at the time of interpretation on 04/02/2019 at 8:02 pm to provider [facility] , who verbally acknowledged these results. Electronically Signed   By: Kellie Simmering DO   On: 03/28/2019 20:02   Result Date: 04/08/2019 CLINICAL DATA:  Fall, head trauma, cardiac arrest; head trauma, headache. Spine fracture, cervical, traumatic. Additional history provided: EXAM: CT HEAD WITHOUT CONTRAST CT CERVICAL SPINE WITHOUT CONTRAST TECHNIQUE:  Multidetector CT imaging of the head and cervical spine was performed following the standard protocol without intravenous contrast. Multiplanar CT image reconstructions of the cervical spine were also generated. COMPARISON:  No pertinent prior studies available for comparison. FINDINGS: CT HEAD FINDINGS Brain: There is no evidence of acute intracranial hemorrhage. There is a 2.9 cm focus of cortical/subcortical hypodensity within the mid to anterior left frontal lobe and left frontal operculum (series 3, images 21-25). This may reflect an acute ischemic infarct or non hemorrhagic parenchymal contusion. A small cortically based infarct within the mid to posterior right frontal lobe is age-indeterminate, although favored chronic (series 3, image 25). There is no evidence of intracranial mass. No midline shift or extra-axial fluid collection. Mild ill-defined hypoattenuation within the cerebral white matter is nonspecific, but consistent with chronic small vessel ischemic disease. Mild generalized parenchymal atrophy. Vascular: No hyperdense vessel. Skull: Normal. Negative for fracture or focal lesion. Sinuses/Orbits: Visualized orbits demonstrate no acute abnormality. Mild ethmoid and left maxillary sinus mucosal thickening. No significant mastoid effusion. Other: Left periorbital soft tissue hematoma. CT CERVICAL SPINE FINDINGS Alignment: Cervical levocurvature. Trace C2-C3 grade 1 anterolisthesis. Trace C3-C4 retrolisthesis. 2 mm C4-C5 grade 1 anterolisthesis. Trace anterolisthesis at the C5-C6, C6-C7 and C7-T1 levels. Skull base and vertebrae: The basion-dental and atlanto-dental intervals are maintained.No evidence of acute fracture to the cervical spine. Soft tissues and spinal canal: No prevertebral fluid or swelling. No visible canal hematoma. Disc levels: Pannus formation/ligamentous hypertrophy at the C1-C2 articulation. Erosive changes within the posterior aspect of the dens which may reflect sequela of an  inflammatory arthropathy. Cervical spondylosis. Most notably at C3-C4, there is advanced disc space narrowing with a posterior disc osteophyte complex and uncovertebral hypertrophy. Additionally, there is multilevel facet arthropathy. Upper chest: Streak artifact arising from a left-sided pacer device and left shoulder prosthesis limits evaluation of the lung apices. Interlobular septal thickening within the imaged lung apices may reflect edema. Other: There is an acute, displaced and subtly comminuted fracture of the distal left clavicle (series 5, image 55) (series 7, image 47). Acute, nondisplaced fracture of the posterior left second rib (series 7, image 50). IMPRESSION: CT head: 1. 2.9 cm region of cortical/subcortical hypodensity within the left frontal lobe, which may reflect an acute infarct or non hemorrhagic parenchymal contusion. 2. Small cortically based infarct within the mid to posterior right frontal lobe, age indeterminate but favored chronic. 3. Left periorbital soft tissue hematoma. 4. Mild generalized parenchymal atrophy and chronic small vessel ischemic disease. CT cervical spine: 1. No evidence of acute fracture to the cervical spine. 2. Cervical spondylosis without high-grade bony spinal canal narrowing. 3. Multilevel spondylolisthesis. Most notably there is 2 mm C4-C5 grade 1 anterolisthesis. 4. Ligamentous hypertrophy/pannus formation at the C1-C2 articulation. Erosive changes within the posterior aspect of the dens may reflect sequela of an inflammatory arthropathy. 5. Acute, displaced and subtly comminuted fracture of the distal left clavicle. Consider dedicated radiographs of the left shoulder for further evaluation. 6. Acute nondisplaced fracture of the posterior  left second rib. Consider thoracic CT to more completely evaluate for thoracic trauma. Electronically Signed: By: Kellie Simmering DO On: 04/10/2019 19:59   DG Chest Port 1 View  Result Date: 03/21/2019 CLINICAL DATA:  Intubated,  collapse, CPR EXAM: PORTABLE CHEST 1 VIEW COMPARISON:  12/10/2018 chest radiograph. FINDINGS: Endotracheal tube tip is 2.7 cm above the carina. Stable configuration of 2 lead left subclavian pacemaker. Pacer pads overlies the lower left chest. Stable cardiomediastinal silhouette with mild cardiomegaly. No pneumothorax. No pleural effusion. Lungs appear clear, with no acute consolidative airspace disease and no pulmonary edema. Bilateral total shoulder arthroplasty. IMPRESSION: 1. Well-positioned endotracheal tube. 2. Cardiomegaly without overt pulmonary edema. No active pulmonary disease. Electronically Signed   By: Ilona Sorrel M.D.   On: 03/31/2019 20:22    LABORATORY DATA: Lab Results  Component Value Date   WBC 8.0 01/14/2019   HGB 12.6 03/13/2019   HCT 37.0 04/03/2019   MCV 92 01/14/2019   PLT 370 01/14/2019    Recent Labs  Lab 03/14/2019 1750  NA 142  K 3.3*    Lipid Panel     Component Value Date/Time   CHOL 114 05/05/2018 1845   TRIG 123 05/05/2018 1845   HDL 31 (L) 05/05/2018 1845   CHOLHDL 3.7 05/05/2018 1845   VLDL 25 05/05/2018 1845   LDLCALC 58 05/05/2018 1845    BNP (last 3 results) Recent Labs    12/10/18 0918  BNP 1,673.1*    HEMOGLOBIN A1C No results found for: HGBA1C, MPG  Cardiac Panel (last 3 results) No results for input(s): CKTOTAL, CKMB, RELINDX in the last 8760 hours.  Invalid input(s): TROPONINHS  No results found for: CKTOTAL, CKMB, CKMBINDEX   TSH Recent Labs    08/01/18 1536  TSH 0.956     Scheduled Meds: . Chlorhexidine Gluconate Cloth  6 each Topical Daily  . lidocaine  1 patch Transdermal Q24H  . mouth rinse  15 mL Mouth Rinse BID  . pantoprazole (PROTONIX) IV  40 mg Intravenous Q24H   Continuous Infusions: . acetaminophen Stopped (04/06/19 1239)   PRN Meds:.[DISCONTINUED] glycopyrrolate **OR** glycopyrrolate **OR** [DISCONTINUED] glycopyrrolate, polyvinyl alcohol  CARDIAC DATABASE: Pacemaker: Device interrogated today  April 06, 2019 Medtronic Adapta [id number], dual-chamber pacemaker Implanted: October 27, 2015 Estimated remaining longevity: 6-8.5 years per report. Mode VVIR Paced: 62.3% 1 episode of high ventricular rate yesterday April 05, 2018 3:48 PM (suggestive of ventricular fibrillation)  EKG: 04/06/2019: Atrial flutter with variable conduction, right bundle branch block, old inferior infarct, diffuse T wave inversions in the precordial leads as well as leads II, III and aVF cannot rule out underlying ischemia.  Compared to prior EKG dated/07/2019 T wave inversions in the lateral leads are new.  Echocardiogram: 09/18/2018: LVEF 40-45%, mild global hypokinesis with mildly reduced LV systolic function.  Severely dilated left atrium, trace AR, moderate to severe MR, mild TR.  Nuclear stress test: 02/03/2017: The study is normal. This is a low risk study. The patient was in atrial fibrillation so gating was not possible. Therefore, we were not able to obtain any information regarding left ventricular function.   IMPRESSION & RECOMMENDATIONS:  Primary diagnoses: Out of hospital ventricular fibrillation cardiac arrest  Based on electronic medical records it appears the patient received basic life support immediately after the incident occurred by bystander.  She regained ROSC after 45 minutes.  At the time of evaluation patient is extubated, alert and oriented, relatively back to baseline according to her daughter.  Recommend CBC and BMP.  From a cardiovascular standpoint patient is noted to have an LVEF based on a prior echo of 40 to 45% despite that may consider gentle IV hydration until patient resumes diet.  EKG reviewed underlying rhythm is atrial flutter.  Patient does have T wave inversions in the anteroseptal precordial leads most likely secondary to underlying right bundle branch block; however, the T wave inversions in the lateral leads appear to be new compared to prior EKG.  This could be  secondary to recent resuscitation efforts (stunned myocardium) or underlying ischemia.  For now would recommend stabilizing the patient from a cardiovascular standpoint.  May initiate beta-blocker therapy as tolerated, at her home dose.  Hold digoxin for now as she may have sustained acute kidney injury given the cardiac arrest.  Continue telemetry.  Patient is scheduled for CT of the head without contrast.  Study dated 03/14/2019 reported "2.9 cm region of cortical/subcortical hypodensity within the left frontal lobe, which may reflect an acute infarct or non hemorrhagic parenchymal contusion."  Clinically there is no acute indication to undergo left heart catheterization as patient does not have any active chest pain and no evidence of myocardial injury pattern on EKG.  I did discuss with both the patient and her daughter Deanna Spencer that we can consider undergoing left heart catheterization prior to discharge in the setting of ventricular fibrillation cardiac arrest.  However, would await the results of the CT scan tomorrow, see how patient progresses clinically, and based on patient's goals of care.  Patient and her daughter voiced that she does not want any heroic measures and has reinstated a CODE STATUS of DNR.  Case discussed with the patient's nurse and the intensivist.  Secondary diagnoses: Permanent atrial fibrillation status post watchman implantation:  Rate control: At home on metoprolol and digoxin.  Rhythm control: N/A.  Thromboembolic prophylaxis: Not on oral anticoagulation due to gastric intestinal bleeding.  Status post watchman implantation  CHA2DS2-VASc SCORE is 5 which correlates to 6.7% risk of stroke per year. (LV dysfunction, age, history of AAA, and gender).  History of sick sinus syndrome status post dual-chamber pacemaker in situ: Pacemaker interrogated by the [employer] rep today.  Physical report noted in the chart and reviewed summarized findings above.  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  History of abdominal aortic aneurysm without rupture: Per patient's daughter Deanna Spencer it has been monitored periodically.  History of valvular heart disease: Echocardiogram pending.  Patient and her daughter's Deanna Spencer) questions and concerns were addressed to their satisfaction. She and karen voices understanding and also provided verbal feedback.   Evaluation and management (27min) with time spent obtaining history, performing exam, reviewing labs, studies, and time spent in counseling and coordination care with the patient regarding complex decision making and discussion as state above, and documenting clinical evaluation.  This note was created using a voice recognition software as a result there may be grammatical errors inadvertently enclosed that do not reflect the nature of this encounter. Every attempt is made to correct such errors.  Deanna Kras, DO, [facility]. [place] Office: [phone] 04/06/2019, 5:27 PM

## 2019-04-07 ENCOUNTER — Inpatient Hospital Stay (HOSPITAL_COMMUNITY): Payer: Medicare Other

## 2019-04-07 DIAGNOSIS — I340 Nonrheumatic mitral (valve) insufficiency: Secondary | ICD-10-CM

## 2019-04-07 DIAGNOSIS — N179 Acute kidney failure, unspecified: Secondary | ICD-10-CM

## 2019-04-07 LAB — CBC WITH DIFFERENTIAL/PLATELET
Abs Immature Granulocytes: 0.05 10*3/uL (ref 0.00–0.07)
Basophils Absolute: 0 10*3/uL (ref 0.0–0.1)
Basophils Relative: 0 %
Eosinophils Absolute: 0.1 10*3/uL (ref 0.0–0.5)
Eosinophils Relative: 1 %
HCT: 36 % (ref 36.0–46.0)
Hemoglobin: 10.9 g/dL — ABNORMAL LOW (ref 12.0–15.0)
Immature Granulocytes: 0 %
Lymphocytes Relative: 5 %
Lymphs Abs: 0.7 10*3/uL (ref 0.7–4.0)
MCH: 29 pg (ref 26.0–34.0)
MCHC: 30.3 g/dL (ref 30.0–36.0)
MCV: 95.7 fL (ref 80.0–100.0)
Monocytes Absolute: 0.6 10*3/uL (ref 0.1–1.0)
Monocytes Relative: 5 %
Neutro Abs: 11.6 10*3/uL — ABNORMAL HIGH (ref 1.7–7.7)
Neutrophils Relative %: 89 %
Platelets: 194 10*3/uL (ref 150–400)
RBC: 3.76 MIL/uL — ABNORMAL LOW (ref 3.87–5.11)
RDW: 17.8 % — ABNORMAL HIGH (ref 11.5–15.5)
WBC: 13.1 10*3/uL — ABNORMAL HIGH (ref 4.0–10.5)
nRBC: 0 % (ref 0.0–0.2)

## 2019-04-07 LAB — BASIC METABOLIC PANEL
Anion gap: 13 (ref 5–15)
BUN: 46 mg/dL — ABNORMAL HIGH (ref 8–23)
CO2: 19 mmol/L — ABNORMAL LOW (ref 22–32)
Calcium: 8.5 mg/dL — ABNORMAL LOW (ref 8.9–10.3)
Chloride: 109 mmol/L (ref 98–111)
Creatinine, Ser: 2.87 mg/dL — ABNORMAL HIGH (ref 0.44–1.00)
GFR calc Af Amer: 18 mL/min — ABNORMAL LOW (ref >60)
GFR calc non Af Amer: 15 mL/min — ABNORMAL LOW (ref >60)
Glucose, Bld: 106 mg/dL — ABNORMAL HIGH (ref 70–99)
Potassium: 4.1 mmol/L (ref 3.5–5.1)
Sodium: 141 mmol/L (ref 135–145)

## 2019-04-07 LAB — ECHOCARDIOGRAM COMPLETE
Height: 59 in
Weight: 1922.41 oz

## 2019-04-07 LAB — MAGNESIUM: Magnesium: 1.7 mg/dL (ref 1.7–2.4)

## 2019-04-07 MED ORDER — METOPROLOL SUCCINATE ER 50 MG PO TB24
100.0000 mg | ORAL_TABLET | Freq: Every day | ORAL | Status: DC
  Administered 2019-04-07: 100 mg via ORAL
  Administered 2019-04-08: 50 mg via ORAL
  Filled 2019-04-07 (×2): qty 2

## 2019-04-07 MED ORDER — FAMOTIDINE 20 MG PO TABS
40.0000 mg | ORAL_TABLET | Freq: Every day | ORAL | Status: DC
  Administered 2019-04-07: 40 mg via ORAL
  Filled 2019-04-07: qty 2

## 2019-04-07 MED ORDER — TRAMADOL HCL 50 MG PO TABS
50.0000 mg | ORAL_TABLET | Freq: Four times a day (QID) | ORAL | Status: DC | PRN
  Administered 2019-04-07 – 2019-04-08 (×3): 50 mg via ORAL
  Filled 2019-04-07 (×3): qty 1

## 2019-04-07 MED ORDER — ALBUTEROL SULFATE (2.5 MG/3ML) 0.083% IN NEBU
2.5000 mg | INHALATION_SOLUTION | Freq: Four times a day (QID) | RESPIRATORY_TRACT | Status: DC | PRN
  Administered 2019-04-07 – 2019-04-08 (×2): 2.5 mg via RESPIRATORY_TRACT
  Filled 2019-04-07: qty 3

## 2019-04-07 MED ORDER — SODIUM CHLORIDE 0.9 % IV SOLN: INTRAVENOUS | Status: DC

## 2019-04-07 MED ORDER — ACETAMINOPHEN 10 MG/ML IV SOLN: 1000.0000 mg | Freq: Four times a day (QID) | INTRAVENOUS | Status: DC | PRN

## 2019-04-07 MED ORDER — CALCIUM CITRATE 950 (200 CA) MG PO TABS
200.0000 mg | ORAL_TABLET | Freq: Every day | ORAL | Status: DC
  Administered 2019-04-07 – 2019-04-09 (×3): 200 mg via ORAL
  Filled 2019-04-07 (×3): qty 1

## 2019-04-07 MED ORDER — ACETAMINOPHEN 325 MG PO TABS
650.0000 mg | ORAL_TABLET | Freq: Four times a day (QID) | ORAL | Status: DC
  Administered 2019-04-07 – 2019-04-08 (×5): 650 mg via ORAL
  Filled 2019-04-07 (×5): qty 2

## 2019-04-07 MED ORDER — VITAMIN D 25 MCG (1000 UNIT) PO TABS
1000.0000 [IU] | ORAL_TABLET | Freq: Every day | ORAL | Status: DC
  Administered 2019-04-08 – 2019-04-09 (×2): 1000 [IU] via ORAL
  Filled 2019-04-07 (×3): qty 1

## 2019-04-07 MED ORDER — ACETAMINOPHEN 325 MG PO TABS: 650.0000 mg | ORAL_TABLET | Freq: Four times a day (QID) | ORAL | Status: DC | PRN

## 2019-04-07 MED ORDER — ACETAMINOPHEN 500 MG PO TABS
1000.0000 mg | ORAL_TABLET | Freq: Four times a day (QID) | ORAL | Status: DC | PRN
  Administered 2019-04-07: 1000 mg via ORAL
  Filled 2019-04-07: qty 2

## 2019-04-07 MED ORDER — SERTRALINE HCL 50 MG PO TABS
50.0000 mg | ORAL_TABLET | Freq: Every day | ORAL | Status: DC
  Administered 2019-04-07 – 2019-04-09 (×3): 50 mg via ORAL
  Filled 2019-04-07 (×4): qty 1

## 2019-04-07 MED ORDER — FUROSEMIDE 10 MG/ML IJ SOLN
40.0000 mg | Freq: Once | INTRAMUSCULAR | Status: AC
  Administered 2019-04-07: 40 mg via INTRAVENOUS
  Filled 2019-04-07: qty 4

## 2019-04-07 NOTE — Progress Notes (Signed)
[facility] Progress Note Patient Name: Deanna Spencer DOB: 02/09/1940 MRN: [id number]   Date of Service  04/07/2019  HPI/Events of Note  Recurrent urinary retention, has had I/O cath placed earlier as well  eICU Interventions  Insert foley      Intervention Category Intermediate Interventions: Oliguria - evaluation and management  Margaretmary Lombard 04/07/2019, 11:09 PM

## 2019-04-07 NOTE — Progress Notes (Signed)
  Speech Language Pathology Treatment: Dysphagia  Patient Details Name: ONAWA INGLIS MRN: [id number] DOB: 04-Dec-1940 Today's Date: 04/07/2019 Time: [id number] SLP Time Calculation (min) (ACUTE ONLY): 14 min  Assessment / Plan / Recommendation Clinical Impression  Pt was seen for skilled ST targeting diagnostic treatment and diet tolerance.  SLP recommendations for NPO on 3/28 with MD upgrading pt to Dysphagia 1 (puree) solids and thin liquids just prior to this tx session.  Pt was encountered lethargic in bed with daughters present at bedside.  Upon SLP arrival, she was observed to have congested sounding inspirations with a weak cough that appeared ineffective in clearing secretions.  Daughters stated that the pt was regularly using the flutter valve and spirometer.  Pt was seen with trial of ice chips, thin liquid, and puree.  She exhibited good bolus acceptance despite lethargy and she was able to hold the cup independently.  AP transport appeared prolonged with puree trials and suspect delayed swallow initiation with thin liquid trials.  An immediate cough was noted following 1/5 straw sips of thin liquid; otherwise no overt s/sx of aspiration were observed with any trials.  RR was noted to be in the low 30s and pt appeared dyspneic throughout this evaluation.  She is likely at an increased risk for aspiration secondary to increased work to coordinate breathing/swallowing.  Per MD recommendations, would continue Dysphagia 1 (puree) solids and thin liquids; however, pt would benefit from strict adherence to the following compensatory strategies: 1) Small bites/sips 2) Slow rate of intake 3) Take a break if SOB 4) Sit upright as possible.  Pt and family were educated regarding recommendations and RN was made aware.  SLP will f/u closely to monitor diet tolerance.    HPI HPI: 79 yo female was in usual state of health and out shopping at [place].  Had witnessed arrest.  On arrival by EMS had agonal  respiratory efforts.  Noted to be in VF.  Shocked x 3, and given amiodarone.  Then had PEA and then VT with repeated shock.  Had ROSC after 45 minutes.  On arrival by family medical team informed pt had DNR status.  They opted to continue supportive care to see if she showed any signs of improvement.      SLP Plan  Goals updated       Recommendations  Diet recommendations: Dysphagia 1 (puree);Thin liquid(per MD recommendation) Liquids provided via: Cup;Straw Medication Administration: Crushed with puree Supervision: Full supervision/cueing for compensatory strategies;Trained caregiver to feed patient;Staff to assist with self feeding Compensations: Slow rate;Small sips/bites Postural Changes and/or Swallow Maneuvers: Seated upright 90 degrees                Oral Care Recommendations: Oral care BID;Staff/trained caregiver to provide oral care Follow up Recommendations: Other (comment)(TBD) SLP Visit Diagnosis: Dysphagia, unspecified (R13.10) Plan: Goals updated       GO               Colin Mulders., M.S., [facility] Office: [phone]  [place] 04/07/2019, 3:24 PM

## 2019-04-07 NOTE — Progress Notes (Signed)
[facility] Progress Note Patient Name: Deanna Spencer DOB: [birth year] MRN: [id number]   Date of Service  04/07/2019  HPI/Events of Note  Pt needs iv  PRN pain medications.  eICU Interventions  Ofirmev ordered.        [name] 04/07/2019, 12:48 AM

## 2019-04-07 NOTE — Progress Notes (Signed)
Progress Note  Patient Name: Deanna Spencer Date of Encounter: 04/07/2019  Attending physician: Chesley Mires, MD Primary care provider: Nuala Alpha, DO Primary Cardiologist: Dr. Vernell Leep Consultant:Osaze Hubbert Terri Skains   Subjective: Deanna Spencer is a 79 y.o. female whose past medical history and cardiac risk factors include: AAA without rupture, permanent atrialfibrillation,s/p Watchman LAAC device, h/o recurrent GI bleeding with ulcers following gastric bypass surgery, h/o multiple blood transfusions, exploratory laporotomy with revision of gastrojejunostomy, esophagogastroscopy (11/2016), rheumatoid arthritis, multiple orthopedic issues.  She presented to the hospital with a chief complaint of " status post cardiac arrest."  Patient seen and examined at bedside at approximately 2:10 PM.  Patient is accompanied by her daughters. Patient denies anginal discomfort. Compared to yesterday, patient is more short of breath and less conversational. Echocardiogram performed, results pending. Telemetry reviewed which shows atrial fibrillation with ventricular ectopy.  Objective: Vital Signs in the last 24 hours: Temp:  [97.6 F (36.4 C)-98.8 F (37.1 C)] 98.3 F (36.8 C) (03/28 1200) Pulse Rate:  [62-84] 73 (03/28 1400) Resp:  [9-25] 16 (03/28 1400) BP: (89-128)/(57-78) 128/64 (03/28 1400) SpO2:  [90 %-100 %] 97 % (03/28 1400) Weight:  [54.5 kg] 54.5 kg (03/28 0500)  Intake/Output from previous day: 03/27 0701 - 03/28 0700 In: 300 [IV Piggyback:300] Out: 900 [Urine:900]  Weights:  [employer]   04/07/19 0500  Weight: 54.5 kg   Telemetry: Personally reviewed.  Illustrates atrial fibrillation with ventricular ectopy (PVCs, bigeminy, nonsustained ventricular tachycardia)  Physical examination: CONSTITUTIONAL: Well-developed but frail elderly woman resting in bed comfortably. SKIN: Skin is warm and dry.  No jaundice EYES: No scleral icterus.  Ecchymosis noted over the left  orbital region. MOUTH/THROAT: Dry oral membranes.  CHEST poor inspiratory effort noted, pacemaker noted in the left infraclavicular region, site is clean dry and intact. LUNGS: Clear to auscultation laterally.  Due to recent fall and chest compressions unable to auscultate posteriorly due to difficulty in sitting upright.  CARDIOVASCULAR: Irregularly irregular, positive [id number], holosystolic murmur heard at the apex.  No gallops or rubs. ABDOMINAL: Nonobese, soft, nontender not distended, no apparent ascites.  EXTREMITIES: No peripheral edema.  Warm to touch bilaterally. HEMATOLOGIC: bruising noted over the left orbital region, left frontal/temporal region. NEUROLOGIC: Oriented to person, place, and time. Nonfocal.  Lab Results: Hematology Recent Labs  Lab 03/13/2019 1750 04/07/19 0558  WBC  --  13.1*  RBC  --  3.76*  HGB 12.6 10.9*  HCT 37.0 36.0  MCV  --  95.7  MCH  --  29.0  MCHC  --  30.3  RDW  --  17.8*  PLT  --  194    Chemistry Recent Labs  Lab 03/28/2019 1750 04/07/19 0558  NA 142 141  K 3.3* 4.1  CL  --  109  CO2  --  19*  GLUCOSE  --  106*  BUN  --  46*  CREATININE  --  2.87*  CALCIUM  --  8.5*  GFRNONAA  --  15*  GFRAA  --  18*  ANIONGAP  --  13     Imaging: CT HEAD WO CONTRAST  Result Date: 04/07/2019 CLINICAL DATA:  Follow-up examination status post cardiac arrest. EXAM: CT HEAD WITHOUT CONTRAST TECHNIQUE: Contiguous axial images were obtained from the base of the skull through the vertex without intravenous contrast. COMPARISON:  Prior head CT from 03/18/2019. FINDINGS: Brain: Previously noted hypodensity involving the anterior left frontal operculum again seen, stable, age indeterminate. Additional tiny chronic appearing right  frontal infarct noted as well, also unchanged. No other new large vessel territory infarct. No intracranial hemorrhage. Cortical sulcation in gray-white matter differentiation maintained without evidence for evolving anoxic injury. No  mass lesion or midline shift. No hydrocephalus or extra-axial fluid collection. Vascular: No hyperdense vessel. Scattered vascular calcifications noted within the carotid siphons. Skull: Scalp soft tissues and calvarium within normal limits. Sinuses/Orbits: Globes and orbital soft tissues demonstrate no acute finding. Paranasal sinuses are largely clear. No mastoid effusion. Other: None. IMPRESSION: 1. Stable head CT. No evidence for evolving anoxic brain injury status post cardiac arrest. 2. Focal hypodensity involving the anterior left frontal operculum, age indeterminate, but could reflect an evolving acute subacute ischemic infarct. Finding stable from previous. No intracranial hemorrhage. Electronically Signed   By: Jeannine Boga M.D.   On: 04/07/2019 04:24   CT Head Wo Contrast  Addendum Date: 04/06/2019   ADDENDUM REPORT: 04/07/2019 20:02 ADDENDUM: These results were called by telephone at the time of interpretation on 03/22/2019 at 8:02 pm to provider [facility] , who verbally acknowledged these results. Electronically Signed   By: Kellie Simmering DO   On: 03/30/2019 20:02   Result Date: 04/08/2019 CLINICAL DATA:  Fall, head trauma, cardiac arrest; head trauma, headache. Spine fracture, cervical, traumatic. Additional history provided: EXAM: CT HEAD WITHOUT CONTRAST CT CERVICAL SPINE WITHOUT CONTRAST TECHNIQUE: Multidetector CT imaging of the head and cervical spine was performed following the standard protocol without intravenous contrast. Multiplanar CT image reconstructions of the cervical spine were also generated. COMPARISON:  No pertinent prior studies available for comparison. FINDINGS: CT HEAD FINDINGS Brain: There is no evidence of acute intracranial hemorrhage. There is a 2.9 cm focus of cortical/subcortical hypodensity within the mid to anterior left frontal lobe and left frontal operculum (series 3, images 21-25). This may reflect an acute ischemic infarct or non hemorrhagic  parenchymal contusion. A small cortically based infarct within the mid to posterior right frontal lobe is age-indeterminate, although favored chronic (series 3, image 25). There is no evidence of intracranial mass. No midline shift or extra-axial fluid collection. Mild ill-defined hypoattenuation within the cerebral white matter is nonspecific, but consistent with chronic small vessel ischemic disease. Mild generalized parenchymal atrophy. Vascular: No hyperdense vessel. Skull: Normal. Negative for fracture or focal lesion. Sinuses/Orbits: Visualized orbits demonstrate no acute abnormality. Mild ethmoid and left maxillary sinus mucosal thickening. No significant mastoid effusion. Other: Left periorbital soft tissue hematoma. CT CERVICAL SPINE FINDINGS Alignment: Cervical levocurvature. Trace C2-C3 grade 1 anterolisthesis. Trace C3-C4 retrolisthesis. 2 mm C4-C5 grade 1 anterolisthesis. Trace anterolisthesis at the C5-C6, C6-C7 and C7-T1 levels. Skull base and vertebrae: The basion-dental and atlanto-dental intervals are maintained.No evidence of acute fracture to the cervical spine. Soft tissues and spinal canal: No prevertebral fluid or swelling. No visible canal hematoma. Disc levels: Pannus formation/ligamentous hypertrophy at the C1-C2 articulation. Erosive changes within the posterior aspect of the dens which may reflect sequela of an inflammatory arthropathy. Cervical spondylosis. Most notably at C3-C4, there is advanced disc space narrowing with a posterior disc osteophyte complex and uncovertebral hypertrophy. Additionally, there is multilevel facet arthropathy. Upper chest: Streak artifact arising from a left-sided pacer device and left shoulder prosthesis limits evaluation of the lung apices. Interlobular septal thickening within the imaged lung apices may reflect edema. Other: There is an acute, displaced and subtly comminuted fracture of the distal left clavicle (series 5, image 55) (series 7, image 47).  Acute, nondisplaced fracture of the posterior left second rib (series 7, image 50).  IMPRESSION: CT head: 1. 2.9 cm region of cortical/subcortical hypodensity within the left frontal lobe, which may reflect an acute infarct or non hemorrhagic parenchymal contusion. 2. Small cortically based infarct within the mid to posterior right frontal lobe, age indeterminate but favored chronic. 3. Left periorbital soft tissue hematoma. 4. Mild generalized parenchymal atrophy and chronic small vessel ischemic disease. CT cervical spine: 1. No evidence of acute fracture to the cervical spine. 2. Cervical spondylosis without high-grade bony spinal canal narrowing. 3. Multilevel spondylolisthesis. Most notably there is 2 mm C4-C5 grade 1 anterolisthesis. 4. Ligamentous hypertrophy/pannus formation at the C1-C2 articulation. Erosive changes within the posterior aspect of the dens may reflect sequela of an inflammatory arthropathy. 5. Acute, displaced and subtly comminuted fracture of the distal left clavicle. Consider dedicated radiographs of the left shoulder for further evaluation. 6. Acute nondisplaced fracture of the posterior left second rib. Consider thoracic CT to more completely evaluate for thoracic trauma. Electronically Signed: By: Kellie Simmering DO On: 03/15/2019 19:59   CT Cervical Spine Wo Contrast  Addendum Date: 03/11/2019   ADDENDUM REPORT: 03/16/2019 20:02 ADDENDUM: These results were called by telephone at the time of interpretation on 04/03/2019 at 8:02 pm to provider [facility] , who verbally acknowledged these results. Electronically Signed   By: Kellie Simmering DO   On: 03/23/2019 20:02   Result Date: 03/26/2019 CLINICAL DATA:  Fall, head trauma, cardiac arrest; head trauma, headache. Spine fracture, cervical, traumatic. Additional history provided: EXAM: CT HEAD WITHOUT CONTRAST CT CERVICAL SPINE WITHOUT CONTRAST TECHNIQUE: Multidetector CT imaging of the head and cervical spine was performed following  the standard protocol without intravenous contrast. Multiplanar CT image reconstructions of the cervical spine were also generated. COMPARISON:  No pertinent prior studies available for comparison. FINDINGS: CT HEAD FINDINGS Brain: There is no evidence of acute intracranial hemorrhage. There is a 2.9 cm focus of cortical/subcortical hypodensity within the mid to anterior left frontal lobe and left frontal operculum (series 3, images 21-25). This may reflect an acute ischemic infarct or non hemorrhagic parenchymal contusion. A small cortically based infarct within the mid to posterior right frontal lobe is age-indeterminate, although favored chronic (series 3, image 25). There is no evidence of intracranial mass. No midline shift or extra-axial fluid collection. Mild ill-defined hypoattenuation within the cerebral white matter is nonspecific, but consistent with chronic small vessel ischemic disease. Mild generalized parenchymal atrophy. Vascular: No hyperdense vessel. Skull: Normal. Negative for fracture or focal lesion. Sinuses/Orbits: Visualized orbits demonstrate no acute abnormality. Mild ethmoid and left maxillary sinus mucosal thickening. No significant mastoid effusion. Other: Left periorbital soft tissue hematoma. CT CERVICAL SPINE FINDINGS Alignment: Cervical levocurvature. Trace C2-C3 grade 1 anterolisthesis. Trace C3-C4 retrolisthesis. 2 mm C4-C5 grade 1 anterolisthesis. Trace anterolisthesis at the C5-C6, C6-C7 and C7-T1 levels. Skull base and vertebrae: The basion-dental and atlanto-dental intervals are maintained.No evidence of acute fracture to the cervical spine. Soft tissues and spinal canal: No prevertebral fluid or swelling. No visible canal hematoma. Disc levels: Pannus formation/ligamentous hypertrophy at the C1-C2 articulation. Erosive changes within the posterior aspect of the dens which may reflect sequela of an inflammatory arthropathy. Cervical spondylosis. Most notably at C3-C4, there is  advanced disc space narrowing with a posterior disc osteophyte complex and uncovertebral hypertrophy. Additionally, there is multilevel facet arthropathy. Upper chest: Streak artifact arising from a left-sided pacer device and left shoulder prosthesis limits evaluation of the lung apices. Interlobular septal thickening within the imaged lung apices may reflect edema. Other: There is an  acute, displaced and subtly comminuted fracture of the distal left clavicle (series 5, image 55) (series 7, image 47). Acute, nondisplaced fracture of the posterior left second rib (series 7, image 50). IMPRESSION: CT head: 1. 2.9 cm region of cortical/subcortical hypodensity within the left frontal lobe, which may reflect an acute infarct or non hemorrhagic parenchymal contusion. 2. Small cortically based infarct within the mid to posterior right frontal lobe, age indeterminate but favored chronic. 3. Left periorbital soft tissue hematoma. 4. Mild generalized parenchymal atrophy and chronic small vessel ischemic disease. CT cervical spine: 1. No evidence of acute fracture to the cervical spine. 2. Cervical spondylosis without high-grade bony spinal canal narrowing. 3. Multilevel spondylolisthesis. Most notably there is 2 mm C4-C5 grade 1 anterolisthesis. 4. Ligamentous hypertrophy/pannus formation at the C1-C2 articulation. Erosive changes within the posterior aspect of the dens may reflect sequela of an inflammatory arthropathy. 5. Acute, displaced and subtly comminuted fracture of the distal left clavicle. Consider dedicated radiographs of the left shoulder for further evaluation. 6. Acute nondisplaced fracture of the posterior left second rib. Consider thoracic CT to more completely evaluate for thoracic trauma. Electronically Signed: By: Kellie Simmering DO On: 04/10/2019 19:59   DG Chest Port 1 View  Result Date: 03/22/2019 CLINICAL DATA:  Intubated, collapse, CPR EXAM: PORTABLE CHEST 1 VIEW COMPARISON:  12/10/2018 chest  radiograph. FINDINGS: Endotracheal tube tip is 2.7 cm above the carina. Stable configuration of 2 lead left subclavian pacemaker. Pacer pads overlies the lower left chest. Stable cardiomediastinal silhouette with mild cardiomegaly. No pneumothorax. No pleural effusion. Lungs appear clear, with no acute consolidative airspace disease and no pulmonary edema. Bilateral total shoulder arthroplasty. IMPRESSION: 1. Well-positioned endotracheal tube. 2. Cardiomegaly without overt pulmonary edema. No active pulmonary disease. Electronically Signed   By: Ilona Sorrel M.D.   On: 03/21/2019 20:22    Cardiac database: Pacemaker: Device interrogated today April 06, 2019 [employer]tronic Adapta [id number], dual-chamber pacemaker Implanted: October 27, 2015 Estimated remaining longevity: 6-8.5 years per report. Mode VVIR Paced: 62.3% 1 episode of high ventricular rate yesterday April 05, 2018 3:48 PM (suggestive of ventricular fibrillation)  EKG: 04/06/2019: Atrial flutter with variable conduction, right bundle branch block, old inferior infarct, diffuse T wave inversions in the precordial leads as well as leads II, III and aVF cannot rule out underlying ischemia.  Compared to prior EKG dated/07/2019 T wave inversions in the lateral leads are new.  Echocardiogram: 09/18/2018: LVEF 40-45%, mild global hypokinesis with mildly reduced LV systolic function.  Severely dilated left atrium, trace AR, moderate to severe MR, mild TR.  Nuclear stress test: 02/03/2017: The study is normal. This is a low risk study. The patient was in atrial fibrillation so gating was not possible. Therefore, we were not able to obtain any information regarding left ventricular function.  Scheduled Meds: . acetaminophen  650 mg Oral Q6H  . calcium citrate  200 mg of elemental calcium Oral Daily  . Chlorhexidine Gluconate Cloth  6 each Topical Daily  . [START ON 04/08/2019] cholecalciferol  1,000 Units Oral Daily  . famotidine  40 mg Oral QHS   . lidocaine  1 patch Transdermal Q24H  . mouth rinse  15 mL Mouth Rinse BID  . metoprolol succinate  100 mg Oral QHS  . sertraline  50 mg Oral Daily    Continuous Infusions: . sodium chloride 50 mL/hr at 04/07/19 1300    PRN Meds: acetaminophen, acetaminophen **FOLLOWED BY** [START ON 04/12/2019] acetaminophen, traMADol   IMPRESSION & RECOMMENDATIONS:  Primary  diagnoses: Out of hospital ventricular fibrillation cardiac arrest  Based on electronic medical records it appears the patient received basic life support immediately after the incident occurred by bystander.  She regained ROSC after 45 minutes.  Currently managed by CCM.  Agree with gentle IV hydration acute kidney injury.  Telemetry reviewed.  Patient has multiple episodes of ventricular ectopy during which she is asymptomatic.  She was restarted on metoprolol.  May consider increasing the metoprolol if hemodynamics allow.  Continue telemetry  Echocardiogram pending, performed today.  For now would recommend stabilizing the patient from a cardiovascular standpoint.  I did discuss with both the patient and her daughter Santiago Glad that we can consider undergoing left heart catheterization prior to discharge in the setting of ventricular fibrillation cardiac arrest.  However, given her current respiratory status, acute kidney injury, and CT of the head without contrast findings as noted above would recommend medical management for now unless there is change in clinical status.  Keep potassium 4 magnesium 2.0.  Added magnesium level to the labs today.   Secondary diagnoses: Acute kidney injury most likely secondary to intravascular depletion and cardiac arrest:  Currently on gentle IV hydration.  Monitor BUN and creatinine.  Currently managed by critical care medicine  Permanent atrial fibrillation status post watchman implantation:  Rate control: Resumed Metoprolol.   Rhythm control: N/A.  Thromboembolic prophylaxis:  Not on oral anticoagulation due to gastric intestinal bleeding.  Status post watchman implantation  CHA2DS2-VASc SCORE is 5 which correlates to 6.7% risk of stroke per year. (LV dysfunction, age, history of AAA, and gender).  History of sick sinus syndrome status post dual-chamber pacemaker in situ: Pacemaker interrogated by the [employer] rep.  Physical report noted in the chart and reviewed summarized findings above.  History of gastrointestinal bleeding requiring multiple transfusions due to peptic ulcer disease and status post gastric bypass.  History of abdominal aortic aneurysm without rupture: Per patient's daughter Santiago Glad it has been monitored periodically.  History of valvular heart disease: Echocardiogram pending  Patient's questions and concerns were addressed to her and her daughters satisfaction. She and her daughters voice understanding of the instructions provided during this encounter.   This note was created using a voice recognition software as a result there may be grammatical errors inadvertently enclosed that do not reflect the nature of this encounter. Every attempt is made to correct such errors.  Rex Kras, DO, [facility]. [place] Office: [phone] 04/07/2019, 2:50 PM

## 2019-04-07 NOTE — Progress Notes (Signed)
RT NOTE:  RT called to put patient on BIPAP due to patient complaining of difficulty breathing. RT attempted to put BIPAP mask on patient and she became extremely anxious and screaming she could not have anything on her face. RT tried again to hold mask in front of patients face and ease mask on without success. Pt has coarse rhonchi bilaterally. RT encouraged cough. Daughter states patient is refusing to do IS or flutter.  Respiratory assessment completed and Albuterol prn treatment given. Patients daughter is at bedside tonight and says she understands her mother is having a difficult time but doesn't want her anxious or worked up if she cant tolerate BIPAP. RT reported this to RN and will continue to monitor.

## 2019-04-07 NOTE — Plan of Care (Signed)

## 2019-04-07 NOTE — Progress Notes (Signed)
  Echocardiogram 2D Echocardiogram has been performed.  Jannett Celestine 04/07/2019, 1:58 PM

## 2019-04-07 NOTE — Progress Notes (Signed)
NAME:  Deanna Spencer, MRN:  [id number], DOB:  23-May-1940, LOS: 2 ADMISSION DATE:  03/29/2019, CONSULTATION DATE:  04/03/2019 REFERRING MD:  Dr. Garlan Fillers, ER, CHIEF COMPLAINT:  Cardiac arrest   Brief History   79 yo female had witnessed arrest.  Noted to be in VF.  ROSC after 45 minutes.  Family opted to continue short term trial of supportive care, but DNR otherwise.  History of present illness   79 yo female was in usual state of health and out shopping at [place].  Had witnessed arrest.  On arrival by EMS had agonal respiratory efforts.  Noted to be in VF.  Shocked x 3, and given amiodarone.  Then had PEA and then VT with repeated shock.  Had ROSC after 45 minutes.  On arrival by family medical team informed pt had DNR status.  They opted to continue supportive care to see if she showed any signs of improvement.  Past Medical History  PUD, Sick sinus syndrome, Bladder spasm, RA, Allergies, OA, Neuropathy, Osteoporosis, IBS, Hiatal hernia, Gout, GERD, Diverticulosis, Systolic CHF, A fib  Significant Hospital Events   3/26 Admit 3/27 Exubated and nearly fully conversant  Consults:  PCCM  Procedures:  ETT 3/26 >>  Significant Diagnostic Tests:  CT head 3/26-3/28: no progression of small left frontal contusion.   Micro Data:  SARS CoV2 3/26 >>  Antimicrobials:   None.  Interim history/subjective:  Still having chest pain with deep inspiration. More awake but disoriented to place (thought she was back in [place]),  Objective   Blood pressure 120/68, pulse 78, temperature 98.3 F (36.8 C), temperature source Oral, resp. rate 15, weight 54.5 kg, SpO2 98 %.        Intake/Output Summary (Last 24 hours) at 04/07/2019 1340 Last data filed at 04/07/2019 1300 Gross per 24 hour  Intake 236.12 ml  Output 900 ml  Net -663.88 ml   Filed Weights   04/07/19 0500  Weight: 54.5 kg    Examination:  General - frail elderly woman sitting calmly in bed.  Eyes - Left  Periorbital  ecchymosis.  ENT - small laceration over left superior orbital margin. No neck tenderness  Cardiac - Split S2 2/6 HSM. No edema. Chest - Poor cough due to splinting, paradoxical chest movement. Palpable crepitus reight chest. Normal breath sounds.  Abdomen - soft, non tender, + bowel sounds Extremities - no cyanosis, clubbing, or edema Skin - no rashes Neuro - awake and conversant with no focal deficits. Inattentive, mildly disoriented.    Resolved Hospital Problem list   Acute hypoxic respiratory failure in setting of cardiac arrest.  Assessment & Plan:   VF cardiac arrest. Permanent A fib s/p Watchman device. Chronic systolic CHF with ischemic CM. - hold outpt lasix, restart lopressor - [place] CV recommends continuing home medications for now. - Pacemaker interrogation - possible ' R on T' as cause of arrest.   Acute encephalopathy due to combination of hypoxic ischemic encephalopathy, possible cerebral perfusion Generally much improved and at lower risk for evolving cerebral contusion.  Hx of chronic pain. -Nonnarcotic pain medications - At risk for delirium - frequent reorientation.  - Re-image if examination declines.  Acute hypoxic respiratory insufficiency due to rib fractures. - non-narcotic pain medications. - encourage incentive spirometry. - at risk for respiratory decompensation, keep in ICU for now.  Possible cervical injury - negative CT but unable to get MRI due to pacemaker.  - Soft collar for comfort - Progressive PT  AKI  due to hypoperfusion during arrest.  - gentle hydration  Hx of PUD with GI bleeding. - Protonix   Daily Goals Checklist  Pain/Anxiety/Delirium protocol (if indicated): Acetaminophen and topical lidocaine for pain. VAP protocol (if indicated): Not intubated Respiratory support goals: Progressive mobilization.  Wean oxygen as tolerated Blood pressure target: Keep systolic blood pressure less than 180 DVT prophylaxis: Heparin twice  daily Nutrition Status: High nutritional risk.  Swallow evaluation once more awake.   GI prophylaxis: Protonix Fluid status goals: Allow for euvolemia Urinary catheter: External catheter Central line: PIV only. Glucose control: Phase 1 glycemic protocol Mobility/therapy needs: Up with assistance once C-spine cleared Antibiotic de-escalation: No antibiotics Home medication reconciliation: Resume cardiac medication.  Avoid all sedatives for now. Daily labs: CBC, BMP Code Status: DNR Family Communication: Daughter was updated at bedside. Disposition: ICU until tomorrow   Labs   CBC: Recent Labs  Lab 03/14/2019 1750 04/07/19 0558  WBC  --  13.1*  NEUTROABS  --  11.6*  HGB 12.6 10.9*  HCT 37.0 36.0  MCV  --  95.7  PLT  --  [id number]    Basic Metabolic Panel: Recent Labs  Lab 03/30/2019 1750 04/07/19 0558  NA 142 141  K 3.3* 4.1  CL  --  109  CO2  --  19*  GLUCOSE  --  106*  BUN  --  46*  CREATININE  --  2.87*  CALCIUM  --  8.5*   GFR: Estimated Creatinine Clearance: 12.6 mL/min (A) (by C-G formula based on SCr of 2.87 mg/dL (H)). Recent Labs  Lab 04/07/19 0558  WBC 13.1*    Liver Function Tests: No results for input(s): AST, ALT, ALKPHOS, BILITOT, PROT, ALBUMIN in the last 168 hours. No results for input(s): LIPASE, AMYLASE in the last 168 hours. No results for input(s): AMMONIA in the last 168 hours.  ABG    Component Value Date/Time   PHART 7.046 (LL) 03/13/2019 1750   PCO2ART 43.7 03/19/2019 1750   PO2ART 311.0 (H) 03/27/2019 1750   HCO3 12.0 (L) 03/16/2019 1750   TCO2 13 (L) 04/02/2019 1750   ACIDBASEDEF 18.0 (H) 03/20/2019 1750   O2SAT 100.0 03/11/2019 1750     Coagulation Profile: No results for input(s): INR, PROTIME in the last 168 hours.  Cardiac Enzymes: No results for input(s): CKTOTAL, CKMB, CKMBINDEX, TROPONINI in the last 168 hours.  HbA1C: No results found for: HGBA1C  CBG: No results for input(s): GLUCAP in the last 168 hours.   Kipp Brood, MD [facility] ICU Physician [place]  Pager: [phone] Mobile: [phone] After hours: [phone].  04/07/2019, 1:40 PM

## 2019-04-08 ENCOUNTER — Other Ambulatory Visit: Payer: Self-pay

## 2019-04-08 ENCOUNTER — Inpatient Hospital Stay (HOSPITAL_COMMUNITY): Payer: Medicare Other

## 2019-04-08 DIAGNOSIS — K649 Unspecified hemorrhoids: Secondary | ICD-10-CM | POA: Insufficient documentation

## 2019-04-08 LAB — CBC WITH DIFFERENTIAL/PLATELET
Abs Immature Granulocytes: 0.02 10*3/uL (ref 0.00–0.07)
Basophils Absolute: 0 10*3/uL (ref 0.0–0.1)
Basophils Relative: 0 %
Eosinophils Absolute: 0 10*3/uL (ref 0.0–0.5)
Eosinophils Relative: 0 %
HCT: 38 % (ref 36.0–46.0)
Hemoglobin: 11.4 g/dL — ABNORMAL LOW (ref 12.0–15.0)
Immature Granulocytes: 0 %
Lymphocytes Relative: 3 %
Lymphs Abs: 0.3 10*3/uL — ABNORMAL LOW (ref 0.7–4.0)
MCH: 29.1 pg (ref 26.0–34.0)
MCHC: 30 g/dL (ref 30.0–36.0)
MCV: 96.9 fL (ref 80.0–100.0)
Monocytes Absolute: 0.7 10*3/uL (ref 0.1–1.0)
Monocytes Relative: 6 %
Neutro Abs: 10.3 10*3/uL — ABNORMAL HIGH (ref 1.7–7.7)
Neutrophils Relative %: 91 %
Platelets: 209 10*3/uL (ref 150–400)
RBC: 3.92 MIL/uL (ref 3.87–5.11)
RDW: 17.9 % — ABNORMAL HIGH (ref 11.5–15.5)
WBC: 11.3 10*3/uL — ABNORMAL HIGH (ref 4.0–10.5)
nRBC: 0 % (ref 0.0–0.2)

## 2019-04-08 LAB — COMPREHENSIVE METABOLIC PANEL
ALT: 60 U/L — ABNORMAL HIGH (ref 0–44)
AST: 91 U/L — ABNORMAL HIGH (ref 15–41)
Albumin: 3 g/dL — ABNORMAL LOW (ref 3.5–5.0)
Alkaline Phosphatase: 62 U/L (ref 38–126)
Anion gap: 12 (ref 5–15)
BUN: 55 mg/dL — ABNORMAL HIGH (ref 8–23)
CO2: 21 mmol/L — ABNORMAL LOW (ref 22–32)
Calcium: 9 mg/dL (ref 8.9–10.3)
Chloride: 107 mmol/L (ref 98–111)
Creatinine, Ser: 3.45 mg/dL — ABNORMAL HIGH (ref 0.44–1.00)
GFR calc Af Amer: 14 mL/min — ABNORMAL LOW (ref >60)
GFR calc non Af Amer: 12 mL/min — ABNORMAL LOW (ref >60)
Glucose, Bld: 131 mg/dL — ABNORMAL HIGH (ref 70–99)
Potassium: 4.5 mmol/L (ref 3.5–5.1)
Sodium: 140 mmol/L (ref 135–145)
Total Bilirubin: 1.3 mg/dL — ABNORMAL HIGH (ref 0.3–1.2)
Total Protein: 6.2 g/dL — ABNORMAL LOW (ref 6.5–8.1)

## 2019-04-08 MED ORDER — OXYCODONE HCL 5 MG/5ML PO SOLN
5.0000 mg | ORAL | Status: DC | PRN
  Administered 2019-04-08 – 2019-04-09 (×3): 5 mg via ORAL
  Filled 2019-04-08 (×3): qty 5

## 2019-04-08 MED ORDER — FAMOTIDINE 20 MG PO TABS
20.0000 mg | ORAL_TABLET | Freq: Every day | ORAL | Status: DC
  Administered 2019-04-08: 20 mg via ORAL
  Filled 2019-04-08: qty 1

## 2019-04-08 MED ORDER — ACETAMINOPHEN 160 MG/5ML PO SOLN
650.0000 mg | Freq: Four times a day (QID) | ORAL | Status: DC
  Administered 2019-04-08 – 2019-04-09 (×3): 650 mg via ORAL
  Filled 2019-04-08 (×2): qty 20.3

## 2019-04-08 MED ORDER — MAGNESIUM SULFATE 2 GM/50ML IV SOLN
2.0000 g | Freq: Once | INTRAVENOUS | Status: AC
  Administered 2019-04-08: 2 g via INTRAVENOUS
  Filled 2019-04-08: qty 50

## 2019-04-08 MED ORDER — FENTANYL CITRATE (PF) 100 MCG/2ML IJ SOLN
12.5000 ug | INTRAMUSCULAR | Status: DC | PRN
  Administered 2019-04-08: 12.5 ug via INTRAVENOUS
  Filled 2019-04-08: qty 2

## 2019-04-08 NOTE — Progress Notes (Signed)
  Speech Language Pathology Treatment: Dysphagia  Patient Details Name: Deanna Spencer MRN: [id number] DOB: [birth year] Today's Date: 04/08/2019 Time: [id number] SLP Time Calculation (min) (ACUTE ONLY): 18 min  Assessment / Plan / Recommendation Clinical Impression  Pt was seen for dysphagia treatment to assess improvement in swallow function. Pt, nursing, and family reported that the pt has been tolerating the current diet without overt s/sx of aspiration. However, all parties stated that the pt's p.o. intake has been poor due to her not liking puree. Pt demonstrated confusion during the session and reported dyspnea at the onset of the session but RR was improved compared to that which was noted during the last session and in the low-mid 20s. Pt tolerated all solids and liquids without overt s/sx of aspiration. Mastication time was within functional limits for dysphagia 2 solids and no significant oral residue was noted. Pt's diet will be upgraded to dysphagia 2 solids and thin liquids at this time with observance of swallowing precautions. SLP will follow to assess diet tolerance.   HPI HPI: 79 yo female was in usual state of health and out shopping at [place].  Had witnessed arrest.  On arrival by EMS had agonal respiratory efforts.  Noted to be in VF.  Shocked x 3, and given amiodarone.  Then had PEA and then VT with repeated shock.  Had ROSC after 45 minutes.  On arrival by family medical team informed pt had DNR status.  They opted to continue supportive care to see if she showed any signs of improvement.      SLP Plan  Goals updated       Recommendations  Diet recommendations: Dysphagia 2 (fine chop);Thin liquid Liquids provided via: Cup;Straw Medication Administration: Whole meds with puree(Crush larger pills) Supervision: Full supervision/cueing for compensatory strategies;Trained caregiver to feed patient;Staff to assist with self feeding Compensations: Slow rate;Small  sips/bites Postural Changes and/or Swallow Maneuvers: Seated upright 90 degrees                Oral Care Recommendations: Oral care BID;Staff/trained caregiver to provide oral care Follow up Recommendations: Other (comment)(TBD) SLP Visit Diagnosis: Dysphagia, unspecified (R13.10) Plan: Goals updated       Blaze Sandin I. Hardin Negus, [place], [place] Office number [phone] Pager [place] 04/08/2019, 2:36 PM

## 2019-04-08 NOTE — Progress Notes (Signed)
Progress Note  Patient Name: Deanna Spencer Date of Encounter: 04/08/2019  Attending physician: Chesley Mires, MD Primary care provider: Nuala Alpha, DO Primary Cardiologist: Dr. Vernell Leep Consultant:Tiler Brandis Terri Skains   Subjective: Deanna Spencer is a 79 y.o. female whose past medical history and cardiac risk factors include: AAA without rupture, permanent atrialfibrillation,s/p Watchman LAAC device, h/o recurrent GI bleeding with ulcers following gastric bypass surgery, h/o multiple blood transfusions, exploratory laporotomy with revision of gastrojejunostomy, esophagogastroscopy (11/2016), rheumatoid arthritis, multiple orthopedic issues.  She presented to the hospital with a chief complaint of " status post cardiac arrest."  Patient seen and examined at bedside at approximately 7:50am.  Patient is accompanied by her daughter, Verl Dicker at bedside. Patient denies anginal discomfort.  More awake alert compared to yesterday. Continues to have some degree of conversational dyspnea. Echocardiogram results reviewed with the patient and her daughter during morning rounds. Telemetry reviewed which shows atrial fibrillation with ventricular ectopy, (ectopy has improved compared to yesterday).  Objective: Vital Signs in the last 24 hours: Temp:  [97.1 F (36.2 C)-98.6 F (37 C)] 97.1 F (36.2 C) (03/29 0749) Pulse Rate:  [60-90] 60 (03/29 0600) Resp:  [14-31] 23 (03/29 0600) BP: (105-146)/(58-92) 113/59 (03/29 0600) SpO2:  [85 %-98 %] 96 % (03/29 0600)  Intake/Output from previous day: 03/28 0701 - 03/29 0700 In: 885.7 [I.V.:885.7] Out: 1210 [Urine:1210]  Weights:  Filed Weights   04/07/19 0500  Weight: 54.5 kg   Telemetry: Personally reviewed.  Illustrates atrial fibrillation with ventricular ectopy (PVCs, bigeminy, nonsustained ventricular tachycardia)  Physical examination: CONSTITUTIONAL: Well-developed but frail elderly woman resting in bed comfortably. SKIN: Skin is  warm and dry.  No jaundice EYES: No scleral icterus.  Ecchymosis noted over the left orbital region. MOUTH/THROAT: Dry oral membranes.  CHEST poor inspiratory effort noted, pacemaker noted in the left infraclavicular region, site is clean dry and intact.  Mild ecchymosis noted posteriorly. LUNGS:  Poor inspiratory effort.  Crackles and expiratory wheezes noted bilaterally. CARDIOVASCULAR: Irregularly irregular, positive [id number], holosystolic murmur heard at the apex.  No gallops or rubs. ABDOMINAL: Nonobese, soft, nontender not distended, no apparent ascites.  Urinary Foley present. EXTREMITIES: No peripheral edema.  Warm to touch bilaterally. HEMATOLOGIC: bruising noted over the left orbital region, left frontal/temporal region. NEUROLOGIC: Oriented to person, place, and time. Nonfocal.  Lab Results: Hematology Recent Labs  Lab 04/06/2019 1750 04/07/19 0558 04/08/19 0650  WBC  --  13.1* PENDING  RBC  --  3.76* 3.92  HGB 12.6 10.9* 11.4*  HCT 37.0 36.0 38.0  MCV  --  95.7 96.9  MCH  --  29.0 29.1  MCHC  --  30.3 30.0  RDW  --  17.8* 17.9*  PLT  --  194 209    Chemistry Recent Labs  Lab 03/24/2019 1750 04/07/19 0558 04/08/19 0650  NA 142 141 140  K 3.3* 4.1 4.5  CL  --  109 107  CO2  --  19* 21*  GLUCOSE  --  106* 131*  BUN  --  46* 55*  CREATININE  --  2.87* 3.45*  CALCIUM  --  8.5* 9.0  PROT  --   --  6.2*  ALBUMIN  --   --  3.0*  AST  --   --  91*  ALT  --   --  60*  ALKPHOS  --   --  62  BILITOT  --   --  1.3*  GFRNONAA  --  15* 12*  GFRAA  --  18* 14*  ANIONGAP  --  13 12     Imaging: CT HEAD WO CONTRAST  Result Date: 04/07/2019 CLINICAL DATA:  Follow-up examination status post cardiac arrest. EXAM: CT HEAD WITHOUT CONTRAST TECHNIQUE: Contiguous axial images were obtained from the base of the skull through the vertex without intravenous contrast. COMPARISON:  Prior head CT from 04/01/2019. FINDINGS: Brain: Previously noted hypodensity involving the anterior left  frontal operculum again seen, stable, age indeterminate. Additional tiny chronic appearing right frontal infarct noted as well, also unchanged. No other new large vessel territory infarct. No intracranial hemorrhage. Cortical sulcation in gray-white matter differentiation maintained without evidence for evolving anoxic injury. No mass lesion or midline shift. No hydrocephalus or extra-axial fluid collection. Vascular: No hyperdense vessel. Scattered vascular calcifications noted within the carotid siphons. Skull: Scalp soft tissues and calvarium within normal limits. Sinuses/Orbits: Globes and orbital soft tissues demonstrate no acute finding. Paranasal sinuses are largely clear. No mastoid effusion. Other: None. IMPRESSION: 1. Stable head CT. No evidence for evolving anoxic brain injury status post cardiac arrest. 2. Focal hypodensity involving the anterior left frontal operculum, age indeterminate, but could reflect an evolving acute subacute ischemic infarct. Finding stable from previous. No intracranial hemorrhage. Electronically Signed   By: Jeannine Boga M.D.   On: 04/07/2019 04:24   CT Head Wo Contrast  Addendum Date: 03/25/2019   ADDENDUM REPORT: 04/08/2019 20:02 ADDENDUM: These results were called by telephone at the time of interpretation on 03/15/2019 at 8:02 pm to provider [facility] , who verbally acknowledged these results. Electronically Signed   By: Kellie Simmering DO   On: 03/23/2019 20:02   Result Date: 04/08/2019 CLINICAL DATA:  Fall, head trauma, cardiac arrest; head trauma, headache. Spine fracture, cervical, traumatic. Additional history provided: EXAM: CT HEAD WITHOUT CONTRAST CT CERVICAL SPINE WITHOUT CONTRAST TECHNIQUE: Multidetector CT imaging of the head and cervical spine was performed following the standard protocol without intravenous contrast. Multiplanar CT image reconstructions of the cervical spine were also generated. COMPARISON:  No pertinent prior studies available  for comparison. FINDINGS: CT HEAD FINDINGS Brain: There is no evidence of acute intracranial hemorrhage. There is a 2.9 cm focus of cortical/subcortical hypodensity within the mid to anterior left frontal lobe and left frontal operculum (series 3, images 21-25). This may reflect an acute ischemic infarct or non hemorrhagic parenchymal contusion. A small cortically based infarct within the mid to posterior right frontal lobe is age-indeterminate, although favored chronic (series 3, image 25). There is no evidence of intracranial mass. No midline shift or extra-axial fluid collection. Mild ill-defined hypoattenuation within the cerebral white matter is nonspecific, but consistent with chronic small vessel ischemic disease. Mild generalized parenchymal atrophy. Vascular: No hyperdense vessel. Skull: Normal. Negative for fracture or focal lesion. Sinuses/Orbits: Visualized orbits demonstrate no acute abnormality. Mild ethmoid and left maxillary sinus mucosal thickening. No significant mastoid effusion. Other: Left periorbital soft tissue hematoma. CT CERVICAL SPINE FINDINGS Alignment: Cervical levocurvature. Trace C2-C3 grade 1 anterolisthesis. Trace C3-C4 retrolisthesis. 2 mm C4-C5 grade 1 anterolisthesis. Trace anterolisthesis at the C5-C6, C6-C7 and C7-T1 levels. Skull base and vertebrae: The basion-dental and atlanto-dental intervals are maintained.No evidence of acute fracture to the cervical spine. Soft tissues and spinal canal: No prevertebral fluid or swelling. No visible canal hematoma. Disc levels: Pannus formation/ligamentous hypertrophy at the C1-C2 articulation. Erosive changes within the posterior aspect of the dens which may reflect sequela of an inflammatory arthropathy. Cervical spondylosis. Most notably at C3-C4, there is advanced disc space narrowing with  a posterior disc osteophyte complex and uncovertebral hypertrophy. Additionally, there is multilevel facet arthropathy. Upper chest: Streak artifact  arising from a left-sided pacer device and left shoulder prosthesis limits evaluation of the lung apices. Interlobular septal thickening within the imaged lung apices may reflect edema. Other: There is an acute, displaced and subtly comminuted fracture of the distal left clavicle (series 5, image 55) (series 7, image 47). Acute, nondisplaced fracture of the posterior left second rib (series 7, image 50). IMPRESSION: CT head: 1. 2.9 cm region of cortical/subcortical hypodensity within the left frontal lobe, which may reflect an acute infarct or non hemorrhagic parenchymal contusion. 2. Small cortically based infarct within the mid to posterior right frontal lobe, age indeterminate but favored chronic. 3. Left periorbital soft tissue hematoma. 4. Mild generalized parenchymal atrophy and chronic small vessel ischemic disease. CT cervical spine: 1. No evidence of acute fracture to the cervical spine. 2. Cervical spondylosis without high-grade bony spinal canal narrowing. 3. Multilevel spondylolisthesis. Most notably there is 2 mm C4-C5 grade 1 anterolisthesis. 4. Ligamentous hypertrophy/pannus formation at the C1-C2 articulation. Erosive changes within the posterior aspect of the dens may reflect sequela of an inflammatory arthropathy. 5. Acute, displaced and subtly comminuted fracture of the distal left clavicle. Consider dedicated radiographs of the left shoulder for further evaluation. 6. Acute nondisplaced fracture of the posterior left second rib. Consider thoracic CT to more completely evaluate for thoracic trauma. Electronically Signed: By: Kellie Simmering DO On: 03/13/2019 19:59   CT Cervical Spine Wo Contrast  Addendum Date: 04/06/2019   ADDENDUM REPORT: 04/08/2019 20:02 ADDENDUM: These results were called by telephone at the time of interpretation on 03/24/2019 at 8:02 pm to provider [facility] , who verbally acknowledged these results. Electronically Signed   By: Kellie Simmering DO   On: 03/20/2019 20:02    Result Date: 03/26/2019 CLINICAL DATA:  Fall, head trauma, cardiac arrest; head trauma, headache. Spine fracture, cervical, traumatic. Additional history provided: EXAM: CT HEAD WITHOUT CONTRAST CT CERVICAL SPINE WITHOUT CONTRAST TECHNIQUE: Multidetector CT imaging of the head and cervical spine was performed following the standard protocol without intravenous contrast. Multiplanar CT image reconstructions of the cervical spine were also generated. COMPARISON:  No pertinent prior studies available for comparison. FINDINGS: CT HEAD FINDINGS Brain: There is no evidence of acute intracranial hemorrhage. There is a 2.9 cm focus of cortical/subcortical hypodensity within the mid to anterior left frontal lobe and left frontal operculum (series 3, images 21-25). This may reflect an acute ischemic infarct or non hemorrhagic parenchymal contusion. A small cortically based infarct within the mid to posterior right frontal lobe is age-indeterminate, although favored chronic (series 3, image 25). There is no evidence of intracranial mass. No midline shift or extra-axial fluid collection. Mild ill-defined hypoattenuation within the cerebral white matter is nonspecific, but consistent with chronic small vessel ischemic disease. Mild generalized parenchymal atrophy. Vascular: No hyperdense vessel. Skull: Normal. Negative for fracture or focal lesion. Sinuses/Orbits: Visualized orbits demonstrate no acute abnormality. Mild ethmoid and left maxillary sinus mucosal thickening. No significant mastoid effusion. Other: Left periorbital soft tissue hematoma. CT CERVICAL SPINE FINDINGS Alignment: Cervical levocurvature. Trace C2-C3 grade 1 anterolisthesis. Trace C3-C4 retrolisthesis. 2 mm C4-C5 grade 1 anterolisthesis. Trace anterolisthesis at the C5-C6, C6-C7 and C7-T1 levels. Skull base and vertebrae: The basion-dental and atlanto-dental intervals are maintained.No evidence of acute fracture to the cervical spine. Soft tissues and  spinal canal: No prevertebral fluid or swelling. No visible canal hematoma. Disc levels: Pannus formation/ligamentous hypertrophy at  the C1-C2 articulation. Erosive changes within the posterior aspect of the dens which may reflect sequela of an inflammatory arthropathy. Cervical spondylosis. Most notably at C3-C4, there is advanced disc space narrowing with a posterior disc osteophyte complex and uncovertebral hypertrophy. Additionally, there is multilevel facet arthropathy. Upper chest: Streak artifact arising from a left-sided pacer device and left shoulder prosthesis limits evaluation of the lung apices. Interlobular septal thickening within the imaged lung apices may reflect edema. Other: There is an acute, displaced and subtly comminuted fracture of the distal left clavicle (series 5, image 55) (series 7, image 47). Acute, nondisplaced fracture of the posterior left second rib (series 7, image 50). IMPRESSION: CT head: 1. 2.9 cm region of cortical/subcortical hypodensity within the left frontal lobe, which may reflect an acute infarct or non hemorrhagic parenchymal contusion. 2. Small cortically based infarct within the mid to posterior right frontal lobe, age indeterminate but favored chronic. 3. Left periorbital soft tissue hematoma. 4. Mild generalized parenchymal atrophy and chronic small vessel ischemic disease. CT cervical spine: 1. No evidence of acute fracture to the cervical spine. 2. Cervical spondylosis without high-grade bony spinal canal narrowing. 3. Multilevel spondylolisthesis. Most notably there is 2 mm C4-C5 grade 1 anterolisthesis. 4. Ligamentous hypertrophy/pannus formation at the C1-C2 articulation. Erosive changes within the posterior aspect of the dens may reflect sequela of an inflammatory arthropathy. 5. Acute, displaced and subtly comminuted fracture of the distal left clavicle. Consider dedicated radiographs of the left shoulder for further evaluation. 6. Acute nondisplaced fracture  of the posterior left second rib. Consider thoracic CT to more completely evaluate for thoracic trauma. Electronically Signed: By: Kellie Simmering DO On: 03/28/2019 19:59   DG Chest Port 1 View  Result Date: 04/03/2019 CLINICAL DATA:  Intubated, collapse, CPR EXAM: PORTABLE CHEST 1 VIEW COMPARISON:  12/10/2018 chest radiograph. FINDINGS: Endotracheal tube tip is 2.7 cm above the carina. Stable configuration of 2 lead left subclavian pacemaker. Pacer pads overlies the lower left chest. Stable cardiomediastinal silhouette with mild cardiomegaly. No pneumothorax. No pleural effusion. Lungs appear clear, with no acute consolidative airspace disease and no pulmonary edema. Bilateral total shoulder arthroplasty. IMPRESSION: 1. Well-positioned endotracheal tube. 2. Cardiomegaly without overt pulmonary edema. No active pulmonary disease. Electronically Signed   By: Ilona Sorrel M.D.   On: 03/25/2019 20:22    Cardiac database: Pacemaker: Device interrogated today April 06, 2019 Medtronic Adapta [id number], dual-chamber pacemaker Implanted: October 27, 2015 Estimated remaining longevity: 6-8.5 years per report. Mode VVIR Paced: 62.3% 1 episode of high ventricular rate yesterday April 05, 2018 3:48 PM (suggestive of ventricular fibrillation)  EKG: 04/06/2019: Atrial flutter with variable conduction, right bundle branch block, old inferior infarct, diffuse T wave inversions in the precordial leads as well as leads II, III and aVF cannot rule out underlying ischemia.  Compared to prior EKG dated/07/2019 T wave inversions in the lateral leads are new.  Echocardiogram: 09/18/2018: LVEF 40-45%, mild global hypokinesis with mildly reduced LV systolic function.  Severely dilated left atrium, trace AR, moderate to severe MR, mild TR.  04/07/2019: LVEF 60-65%, mild MR, no other significant valvular abnormalities noted.  Technically difficult study as multiple cardiac chambers and valves were noted to be "not well  visualized."  Nuclear stress test: 02/03/2017: The study is normal. This is a low risk study. The patient was in atrial fibrillation so gating was not possible. Therefore, we were not able to obtain any information regarding left ventricular function.  Scheduled Meds: . acetaminophen  650 mg Oral  Q6H  . calcium citrate  200 mg of elemental calcium Oral Daily  . Chlorhexidine Gluconate Cloth  6 each Topical Daily  . cholecalciferol  1,000 Units Oral Daily  . famotidine  40 mg Oral QHS  . lidocaine  1 patch Transdermal Q24H  . mouth rinse  15 mL Mouth Rinse BID  . metoprolol succinate  100 mg Oral QHS  . sertraline  50 mg Oral Daily    Continuous Infusions: . sodium chloride 50 mL/hr at 04/08/19 0656  . magnesium sulfate bolus IVPB      PRN Meds: acetaminophen, acetaminophen **FOLLOWED BY** [START ON 04/12/2019] acetaminophen, albuterol, traMADol   IMPRESSION & RECOMMENDATIONS:  Primary diagnoses: Out of hospital ventricular fibrillation cardiac arrest  Based on electronic medical records it appears the patient received basic life support immediately after the incident occurred by bystander.  She regained ROSC after 45 minutes.  Currently managed by CCM.  Agree with IV hydration acute kidney injury.  Telemetry reviewed.  Patient has multiple episodes of ventricular ectopy during which she is asymptomatic.  Electrolytes are being optimized to give a magnesium around 2 and a potassium of 4.  Patient is also on beta-blockers.  Continue telemetry  Echocardiogram results reviewed with the patient and her daughter during morning rounds.  For now would recommend stabilizing the patient from a cardiovascular standpoint.  I did discuss with both the patient and her daughter Santiago Glad and Verl Dicker that we can consider undergoing left heart catheterization prior to discharge in the setting of ventricular fibrillation cardiac arrest.  However, given her current respiratory status, acute  kidney injury, and CT of the head without contrast findings as noted above would recommend medical management for now unless there is change in clinical status.  Keep potassium 4 magnesium 2.0.  Added magnesium level to the labs today.   Secondary diagnoses: Acute kidney injury most likely secondary to intravascular depletion and cardiac arrest:  Continue IV hydration.  Monitor BUN and creatinine.  Currently managed by critical care medicine  Patient had a urinary Foley placed yesterday night.  Permanent atrial fibrillation status post watchman implantation:  Rate control: Resumed Metoprolol.   Rhythm control: N/A.  Thromboembolic prophylaxis: Not on oral anticoagulation due to gastric intestinal bleeding.  Status post watchman implantation  CHA2DS2-VASc SCORE is 5 which correlates to 6.7% risk of stroke per year. (LV dysfunction, age, history of AAA, and gender).  History of sick sinus syndrome status post dual-chamber pacemaker in situ: Pacemaker interrogated by the [employer] rep.  Physical report noted in the chart and reviewed summarized findings above.  History of gastrointestinal bleeding requiring multiple transfusions due to peptic ulcer disease and status post gastric bypass.  History of abdominal aortic aneurysm without rupture: Per patient's daughter Santiago Glad it has been monitored periodically.  History of valvular heart disease: Echocardiogram pending  Patient's questions and concerns were addressed to her and her daughters satisfaction. She and her daughters voice understanding of the instructions provided during this encounter.   This note was created using a voice recognition software as a result there may be grammatical errors inadvertently enclosed that do not reflect the nature of this encounter. Every attempt is made to correct such errors.  Rex Kras, DO, [facility]. [place] Office: [phone] 04/08/2019, 8:41 AM

## 2019-04-08 NOTE — Progress Notes (Signed)
PT Cancellation Note  Patient Details Name: Deanna Spencer MRN: [id number] DOB: Jun 24, 1940   Cancelled Treatment:    Reason Eval/Treat Not Completed: Pain limiting ability to participate. Nursing reports patient is having significant pain with any movement or repositioning. Will defer eval today and recheck tomorrow.    [place] 04/08/2019, 3:47 PM [place] Pager [phone] Office [phone]

## 2019-04-08 NOTE — Progress Notes (Signed)
[facility] Progress Note Patient Name: APIFFANY HURLBUTT DOB: 1940/07/22 MRN: [id number]   Date of Service  04/08/2019  HPI/Events of Note  AM labs. Requested. Mg was low on 28 th.   eICU Interventions  CMP, CBC ordered for AM     Intervention Category Intermediate Interventions: Electrolyte abnormality - evaluation and management  Elmer Sow 04/08/2019, 4:53 AM

## 2019-04-08 NOTE — Progress Notes (Signed)
[facility] Progress Note Patient Name: Deanna Spencer DOB: 06-29-1940 MRN: [id number]   Date of Service  04/08/2019  HPI/Events of Note  Multiple issues: 1. Patient paced at rate = 60. Underlying rhythm appears to be AFlutter and 2. No AM labs.   eICU Interventions  Will order: 1. Please decrease Metoprolol XL dose to 50 mg PO tonight.  2. BMP, Mg++ level and CBC with platelets in AM.      Intervention Category Major Interventions: Arrhythmia - evaluation and management  Briggett Tuccillo [place] 04/08/2019, 9:54 PM

## 2019-04-08 NOTE — Progress Notes (Signed)
The chaplain responded to a referral from the nurse for prayer. The chaplain prayed for the family and the patient. The chaplain is available if follow-up is needed.  Brion Aliment Chaplain Resident For questions concerning this note please contact me by pager [phone]

## 2019-04-08 NOTE — Assessment & Plan Note (Signed)
Several large internal hemorrhoids seen on anoscopy. Tucks pads and lidocaine ointment with miralax.

## 2019-04-08 NOTE — Plan of Care (Signed)
Performed NT suctioning, pt tolerated suctioning well but secretions and congestion in chest are persisting. Work of breathing has improved from this morning.   Problem: Clinical Measurements: Goal: Respiratory complications will improve Outcome: Not Progressing

## 2019-04-08 NOTE — Progress Notes (Signed)
NAME:  Deanna Spencer, MRN:  [id number], DOB:  [birth year], LOS: 3 ADMISSION DATE:  03/13/2019, CONSULTATION DATE:  03/13/2019 REFERRING MD:  Dr. Garlan Fillers, ER, CHIEF COMPLAINT:  Cardiac arrest   Brief History   79 yo female had witnessed arrest.  Noted to be in VF.  ROSC after 45 minutes.  Family opted to continue short term trial of supportive care, but DNR otherwise.  History of present illness   79 yo female was in usual state of health and out shopping at [place].  Had witnessed arrest.  On arrival by EMS had agonal respiratory efforts.  Noted to be in VF.  Shocked x 3, and given amiodarone.  Then had PEA and then VT with repeated shock.  Had ROSC after 45 minutes.  On arrival by family medical team informed pt had DNR status.  They opted to continue supportive care to see if she showed any signs of improvement.  Past Medical History  PUD, Sick sinus syndrome, Bladder spasm, RA, Allergies, OA, Neuropathy, Osteoporosis, IBS, Hiatal hernia, Gout, GERD, Diverticulosis, Systolic CHF, A fib  Significant Hospital Events   3/26 Admit 3/27 Exubated and nearly fully conversant 3/28 having chest wall pain with deep inspiration.  Awake, more disoriented in evenings  Consults:  PCCM  Procedures:  ETT 3/26 >>  Significant Diagnostic Tests:  3/26 CTH >> 1. 2.9 cm region of cortical/subcortical hypodensity within the left frontal lobe, which may reflect an acute infarct or non hemorrhagic parenchymal contusion. 2. Small cortically based infarct within the mid to posterior right frontal lobe, age indeterminate but favored chronic. 3. Left periorbital soft tissue hematoma. 4. Mild generalized parenchymal atrophy and chronic small vessel ischemic disease.  3/26 CT cervical >> 1. No evidence of acute fracture to the cervical spine. 2. Cervical spondylosis without high-grade bony spinal canal narrowing. 3. Multilevel spondylolisthesis. Most notably there is 2 mm C4-C5 grade 1 anterolisthesis.  4.  Ligamentous hypertrophy/pannus formation at the C1-C2 articulation. Erosive changes within the posterior aspect of the dens may reflect sequela of an inflammatory arthropathy. 5. Acute, displaced and subtly comminuted fracture of the distal left clavicle. Consider dedicated radiographs of the left shoulder for further evaluation. 6. Acute nondisplaced fracture of the posterior left second rib.  Consider thoracic CT to more completely evaluate for thoracic trauma.  3/28 CTH >>  1. Stable head CT. No evidence for evolving anoxic brain injury status post cardiac arrest. 2. Focal hypodensity involving the anterior left frontal operculum, age indeterminate, but could reflect an evolving acute subacute ischemic infarct. Finding stable from previous. No intracranial hemorrhage  3/28 TTE >>  1. Left ventricular ejection fraction, by estimation, is 60 to 65%. The left ventricle has normal function. Left ventricular endocardial border not optimally defined to evaluate regional wall motion. There is Not well visualized left ventricular hypertrophy. Left ventricular diastolic parameters are indeterminate.  2. Right ventricular systolic function was not well visualized. The right ventricular size is not well visualized.  3. The mitral valve was not well visualized. Mild mitral valve regurgitation. No evidence of mitral stenosis.  4. The aortic valve was not well visualized. Aortic valve regurgitation is not visualized. No aortic stenosis is present.   Micro Data:  SARS CoV2 3/26 >> neg 3/27 MRSA PCR >> neg  Antimicrobials:   None.  Interim history/subjective:  Patient complains of ongoing chest pain. Remains on 2L [place].  Too claustrophobic for BiPAP.  Not complying with with IS/ flutter Urinary retention overnight s/p foley  Objective   Blood pressure (!) 113/59, pulse 60, temperature (!) 97.1 F (36.2 C), temperature source Axillary, resp. rate (!) 23, weight 54.5 kg, SpO2 96 %.         Intake/Output Summary (Last 24 hours) at 04/08/2019 [id number] Last data filed at 04/08/2019 0600 Gross per 24 hour  Intake 885.65 ml  Output 1210 ml  Net -324.35 ml   Filed Weights   04/07/19 0500  Weight: 54.5 kg    Examination: General:  Frail elderly female sitting upright in bed in no distress HEENT: MM pink/moist, left periorbital ecchymosis with dressing over eye Neuro: Awake, appropriate, oriented to person, place, weakly MAE  CV:  - appears to be in flutter, +2 pulses, unable to hear heart sounds over rhonchi PULM:  Paradoxical chest wall movement/ sternal crepitus, diffuse chest wall tenderness, rhonchi diffuse, diminished in bases, very poor cough attempts even with pillow splinting GI: soft, bs+  Extremities: warm/dry, no LE edema  Skin: no rashes   Resolved Hospital Problem list   Acute hypoxic respiratory failure in setting of cardiac arrest.  Assessment & Plan:   VF cardiac arrest. Permanent A fib s/p Watchman device. Chronic systolic CHF with ischemic CM - Pacemaker interrogation - possible ' R on T' as cause of arrest.  - having asymptomatic ventricular ectopy on tele, improved from yesterday  - cardiology following, appreciate recs  - metoprolol as hemodynamics allow- currently rate controlled in the 60's - EKG to evaluate for Aflutter  - TTE 3/28 - limited study, LVEF 60 to 65% - goal Mag > 2; K > 4 - needs pacemaker interrogated  - consider LHC prior to discharge after clinically stabilized   Acute encephalopathy due to combination of hypoxic ischemic encephalopathy, possible cerebral perfusion - improving At risk for delirium  Cerebral contusion.  Hx of chronic pain. - Nonnarcotic pain medications - delirium precautions - ongoing neuro exams  - Re-image if examination declines.  Acute hypoxic respiratory insufficiency due to rib fractures. - CXR to evaluate rib fractures further, consider anesthesia placement of nerve block depending on location -  will not tolerate bipap given claustrophobia  - non-narcotic pain medications-  tylenol / tramadol/ lidocaine patches - encourage patient to comply with pulmonary toilet- IS/ Flutter/ PT - NTS as able to help with secretion clearance  - discussed with attending, will leave in ICU today to monitor closely given frailty  - ongoing aspiration precautions per SLP recs   Possible cervical injury - negative CT but unable to get MRI due to pacemaker.  - Soft collar for comfort - Progressive PT   AKI due to hypoperfusion during arrest, now possible obstructive uropathy due to retention s/p foley placement overnight  - gentle hydration, NS at 50 ml/hr - continue foley  - trend UOP/ BMET  Hx of PUD with GI bleeding. - Protonix  Protein calorie malnutrition P:  Maximize nutrition as able   Daily Goals Checklist  Pain/Anxiety/Delirium protocol (if indicated): Acetaminophen and topical lidocaine for pain. VAP protocol (if indicated): Not intubated Respiratory support goals: Progressive mobilization.  Wean oxygen as tolerated DVT prophylaxis: Heparin twice daily Nutrition Status: encourage, aspiration precautions   GI prophylaxis: Protonix Fluid status goals: Allow for euvolemia Urinary catheter: foley placed for retention 3/28 Central line: PIV only. Glucose control: Phase 1 glycemic protocol Antibiotic de-escalation: No antibiotics Daily labs: CBC, BMP Code Status: DNR Family Communication: Daughter was updated at bedside by Dr. Lynetta Mare  Disposition: ICU for now  Labs   CBC:  Recent Labs  Lab 04/02/2019 1750 04/07/19 0558 04/08/19 0650  WBC  --  13.1* PENDING  NEUTROABS  --  11.6* PENDING  HGB 12.6 10.9* 11.4*  HCT 37.0 36.0 38.0  MCV  --  95.7 96.9  PLT  --  194 [id number]    Basic Metabolic Panel: Recent Labs  Lab 03/17/2019 1750 04/07/19 0558  NA 142 141  K 3.3* 4.1  CL  --  109  CO2  --  19*  GLUCOSE  --  106*  BUN  --  46*  CREATININE  --  2.87*  CALCIUM  --  8.5*   MG  --  1.7   GFR: Estimated Creatinine Clearance: 12.6 mL/min (A) (by C-G formula based on SCr of 2.87 mg/dL (H)). Recent Labs  Lab 04/07/19 0558 04/08/19 0650  WBC 13.1* PENDING    Liver Function Tests: No results for input(s): AST, ALT, ALKPHOS, BILITOT, PROT, ALBUMIN in the last 168 hours. No results for input(s): LIPASE, AMYLASE in the last 168 hours. No results for input(s): AMMONIA in the last 168 hours.  ABG    Component Value Date/Time   PHART 7.046 (LL) 04/08/2019 1750   PCO2ART 43.7 03/12/2019 1750   PO2ART 311.0 (H) 03/14/2019 1750   HCO3 12.0 (L) 03/27/2019 1750   TCO2 13 (L) 04/10/2019 1750   ACIDBASEDEF 18.0 (H) 03/21/2019 1750   O2SAT 100.0 03/29/2019 1750     Coagulation Profile: No results for input(s): INR, PROTIME in the last 168 hours.  Cardiac Enzymes: No results for input(s): CKTOTAL, CKMB, CKMBINDEX, TROPONINI in the last 168 hours.  HbA1C: No results found for: HGBA1C  CBG: No results for input(s): GLUCAP in the last 168 hours.     Kennieth Rad, MSN, AGACNP-BC [facility] 04/08/2019, 8:11 AM

## 2019-04-08 NOTE — Progress Notes (Signed)
FPTS Interim Progress Note  FPTS acknowledges that patient is admitted in ICU.  Appreciate excellent care provided for this patient by CCM.  If patient becomes stable for the floor, FPTS will be happy to assume care for this patient.  We will continue to follow along socially while she is under the care of CCM.  When patient is ready for transfer to the floor, please page [phone] for signout.  [place], DO 04/08/2019, 11:36 AM PGY-2, [facility] pager [phone]

## 2019-04-09 ENCOUNTER — Ambulatory Visit: Payer: Medicare Other

## 2019-04-09 ENCOUNTER — Ambulatory Visit: Payer: Medicare Other | Admitting: Rheumatology

## 2019-04-09 DIAGNOSIS — N179 Acute kidney failure, unspecified: Secondary | ICD-10-CM

## 2019-04-09 DIAGNOSIS — Z95818 Presence of other cardiac implants and grafts: Secondary | ICD-10-CM

## 2019-04-09 DIAGNOSIS — Z8679 Personal history of other diseases of the circulatory system: Secondary | ICD-10-CM

## 2019-04-09 DIAGNOSIS — I4901 Ventricular fibrillation: Principal | ICD-10-CM

## 2019-04-09 DIAGNOSIS — Z8719 Personal history of other diseases of the digestive system: Secondary | ICD-10-CM

## 2019-04-09 DIAGNOSIS — I495 Sick sinus syndrome: Secondary | ICD-10-CM

## 2019-04-09 LAB — CBC WITH DIFFERENTIAL/PLATELET
Abs Immature Granulocytes: 0.02 10*3/uL (ref 0.00–0.07)
Basophils Absolute: 0 10*3/uL (ref 0.0–0.1)
Basophils Relative: 0 %
Eosinophils Absolute: 0.1 10*3/uL (ref 0.0–0.5)
Eosinophils Relative: 1 %
HCT: 38.2 % (ref 36.0–46.0)
Hemoglobin: 11.4 g/dL — ABNORMAL LOW (ref 12.0–15.0)
Immature Granulocytes: 0 %
Lymphocytes Relative: 5 %
Lymphs Abs: 0.5 10*3/uL — ABNORMAL LOW (ref 0.7–4.0)
MCH: 28.9 pg (ref 26.0–34.0)
MCHC: 29.8 g/dL — ABNORMAL LOW (ref 30.0–36.0)
MCV: 97 fL (ref 80.0–100.0)
Monocytes Absolute: 0.8 10*3/uL (ref 0.1–1.0)
Monocytes Relative: 8 %
Neutro Abs: 8.5 10*3/uL — ABNORMAL HIGH (ref 1.7–7.7)
Neutrophils Relative %: 86 %
Platelets: 224 10*3/uL (ref 150–400)
RBC: 3.94 MIL/uL (ref 3.87–5.11)
RDW: 17.8 % — ABNORMAL HIGH (ref 11.5–15.5)
WBC: 9.9 10*3/uL (ref 4.0–10.5)
nRBC: 0 % (ref 0.0–0.2)

## 2019-04-09 LAB — BASIC METABOLIC PANEL
Anion gap: 14 (ref 5–15)
BUN: 65 mg/dL — ABNORMAL HIGH (ref 8–23)
CO2: 18 mmol/L — ABNORMAL LOW (ref 22–32)
Calcium: 9 mg/dL (ref 8.9–10.3)
Chloride: 109 mmol/L (ref 98–111)
Creatinine, Ser: 3.53 mg/dL — ABNORMAL HIGH (ref 0.44–1.00)
GFR calc Af Amer: 14 mL/min — ABNORMAL LOW (ref >60)
GFR calc non Af Amer: 12 mL/min — ABNORMAL LOW (ref >60)
Glucose, Bld: 129 mg/dL — ABNORMAL HIGH (ref 70–99)
Potassium: 4.5 mmol/L (ref 3.5–5.1)
Sodium: 141 mmol/L (ref 135–145)

## 2019-04-09 LAB — MAGNESIUM: Magnesium: 2.2 mg/dL (ref 1.7–2.4)

## 2019-04-09 MED ORDER — LORAZEPAM 2 MG/ML IJ SOLN: 1.0000 mg | INTRAMUSCULAR | Status: DC | PRN

## 2019-04-09 MED ORDER — MORPHINE 100MG IN NS 100ML (1MG/ML) PREMIX INFUSION
0.0000 mg/h | INTRAVENOUS | Status: DC
  Administered 2019-04-09: 5 mg/h via INTRAVENOUS
  Filled 2019-04-09: qty 100

## 2019-04-09 MED ORDER — DIPHENHYDRAMINE HCL 50 MG/ML IJ SOLN: 25.0000 mg | INTRAMUSCULAR | Status: DC | PRN

## 2019-04-09 MED ORDER — GLYCOPYRROLATE 1 MG PO TABS
1.0000 mg | ORAL_TABLET | ORAL | Status: DC | PRN
  Filled 2019-04-09: qty 1

## 2019-04-09 MED ORDER — POLYVINYL ALCOHOL 1.4 % OP SOLN
1.0000 [drp] | Freq: Four times a day (QID) | OPHTHALMIC | Status: DC | PRN
  Filled 2019-04-09: qty 15

## 2019-04-09 MED ORDER — MORPHINE BOLUS VIA INFUSION
2.0000 mg | INTRAVENOUS | Status: DC | PRN
  Administered 2019-04-09: 2 mg via INTRAVENOUS
  Filled 2019-04-09: qty 2

## 2019-04-09 MED ORDER — GLYCOPYRROLATE 0.2 MG/ML IJ SOLN
0.2000 mg | INTRAMUSCULAR | Status: DC | PRN
  Administered 2019-04-09 (×2): 0.2 mg via INTRAVENOUS
  Filled 2019-04-09 (×2): qty 1

## 2019-04-09 MED ORDER — GLYCOPYRROLATE 0.2 MG/ML IJ SOLN: 0.2000 mg | INTRAMUSCULAR | Status: DC | PRN

## 2019-04-10 ENCOUNTER — Other Ambulatory Visit: Payer: Medicare Other

## 2019-04-11 NOTE — Progress Notes (Deleted)
Office Visit Note  Patient: Deanna Spencer             Date of Birth: 1940/10/12           MRN: [id number]             PCP: Nuala Alpha, DO Referring: Martyn Malay, MD Visit Date: 04/22/2019 Occupation: @GUAROCC @  Subjective:  No chief complaint on file.   History of Present Illness: Deanna Spencer is a 79 y.o. female ***   Activities of Daily Living:  Patient reports morning stiffness for *** {minute/hour:19697}.   Patient {ACTIONS;DENIES/REPORTS:21021675::"Denies"} nocturnal pain.  Difficulty dressing/grooming: {ACTIONS;DENIES/REPORTS:21021675::"Denies"} Difficulty climbing stairs: {ACTIONS;DENIES/REPORTS:21021675::"Denies"} Difficulty getting out of chair: {ACTIONS;DENIES/REPORTS:21021675::"Denies"} Difficulty using hands for taps, buttons, cutlery, and/or writing: {ACTIONS;DENIES/REPORTS:21021675::"Denies"}  No Rheumatology ROS completed.   PMFS History:  Patient Active Problem List   Diagnosis Date Noted  . Nonrheumatic mitral valve regurgitation 01/17/2019  . Vomiting 01/14/2019  . Community acquired pneumonia 12/10/2018  . Abnormal loss of weight   . Anastomotic ulcer S/P gastric bypass   . Depression, major, single episode, mild ([place]) 09/20/2018  . Hearing loss 09/20/2018  . History of double vision 09/20/2018  . Fatigue 08/01/2018  . Left shoulder pain 06/30/2018  . Chronic right shoulder pain 06/14/2018  . Anemia due to chronic blood loss 05/07/2018  . Odynophagia 05/07/2018  . Protein-calorie malnutrition ([place]) 05/07/2018  . Absolute anemia   . Cardiac pacemaker in situ 05/06/2018  . History of anastomosis and bypass hemorrhage 05/06/2018  . Atrial fibrillation with rapid ventricular response ([place])   . Dehydration   . Bilious vomiting with nausea   . Gastrojejunal ulcer   . Acute abdominal pain 05/05/2018  . Rheumatoid arthritis involving both hands ([place]) 09/27/2017  . Seasonal allergies 08/07/2017  . Primary osteoarthritis of right knee  06/02/2017  . Osteoarthritis of right knee 05/30/2017  . Primary osteoarthritis of left hip 02/24/2017  . AAA (abdominal aortic aneurysm) without rupture ([place]) 12/21/2016  . Chronic systolic heart failure ([place]) 12/14/2016  . Atrial fibrillation ([place]) 11/22/2016  . Peripheral neuropathy 11/22/2016  . GERD (gastroesophageal reflux disease) 11/22/2016  . Abdominal pain 11/18/2016  . 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 Parasomnia 11/18/2016    Past Medical History:  Diagnosis Date  . AAA (abdominal aortic aneurysm) without rupture ([place]) 12/21/2016   Stable 3cm 4/20, needs f/u imaging ~4/23  . Anemia   . Atrial fibrillation ([place]) 11/22/2016  . Childhood asthma    connected to allergies  . Chronic lower back pain   . Chronic systolic heart failure ([place]) 12/14/2016  . Diplopia 09/27/2017  . Diverticulosis   . Duodenal ulcer   . Dysrhythmia    a fib  . GERD (gastroesophageal reflux disease)   . GI bleed    "have had both upper and lower GIB"  . Gout   . Heart attack ([place]) 2017  . Heart murmur   . History of anastomosis and bypass hemorrhage 05/06/2018  . History of blood transfusion 2017-2018   "related to bleeding ulcers; 47 units PRBC; 2U plasma" (11/23/2016)  . History of duodenal ulcer 11/18/2016  . History of heart attack 11/18/2016  . History of hiatal hernia   . Hx of blood clots    in heart  . IBS (irritable bowel syndrome)   . Liver lesion 08/07/2017  . Neuropathy   . Osteoarthritis of right knee 05/30/2017  . Osteoporosis   . Other chronic pain 11/18/2016  .  Parasomnia 11/18/2016  . Perforated viscus 11/22/2016  . Peripheral neuropathy   . Presence of permanent cardiac pacemaker 10/2015   medtronic; Serial # [id number]  . Presence of Watchman left atrial appendage closure device   . Primary osteoarthritis of left hip 02/24/2017  . 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 Primary osteoarthritis of right knee 06/02/2017    . Raynaud disease   . Rheumatoid arthritis involving both hands ([place]) 09/27/2017  . S/P exploratory laparotomy 11/22/2016  . Seasonal allergies 08/07/2017  . 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 SSS (sick sinus syndrome) ([place])    a. s/p Medtronic PPM placement in 2017  . Stomach ulcer   . Vision changes 08/07/2017    Family History  Problem Relation Age of Onset  . Heart disease Mother   . Liver disease Father   . Ulcers Father   . Prostate cancer Brother   . Colon cancer Neg Hx    Past Surgical History:  Procedure Laterality Date  . ABDOMINAL HYSTERECTOMY    . APPENDECTOMY    . ARTHROPLASTY Right 06/02/2017   RIGHT PATELLA-FEMORAL ARTHROPLASTY, with placement of a cemented 29 mm Stryker asymmetric triathlon patellar button, revision of the tibial polyethylene bearing.  Marland Kitchen BIOPSY  05/06/2018   Procedure: BIOPSY;  Surgeon: Gatha Mayer, MD;  Location: [facility];  Service: Endoscopy;;  . CARPAL TUNNEL RELEASE Bilateral   . CATARACT EXTRACTION W/ INTRAOCULAR LENS  IMPLANT, BILATERAL Bilateral   . CHOLECYSTECTOMY OPEN    . COLONOSCOPY WITH PROPOFOL N/A 10/30/2018   Procedure: COLONOSCOPY WITH PROPOFOL;  Surgeon: Doran Stabler, MD;  Location: [facility];  Service: Gastroenterology;  Laterality: N/A;  . DILATION AND CURETTAGE OF UTERUS    . ESOPHAGOGASTRODUODENOSCOPY (EGD) WITH PROPOFOL N/A 05/06/2018   Procedure: ESOPHAGOGASTRODUODENOSCOPY (EGD) WITH PROPOFOL;  Surgeon: Gatha Mayer, MD;  Location: [facility];  Service: Endoscopy;  Laterality: N/A;  . ESOPHAGOGASTRODUODENOSCOPY (EGD) WITH PROPOFOL N/A 10/30/2018   Procedure: ESOPHAGOGASTRODUODENOSCOPY (EGD) WITH PROPOFOL;  Surgeon: Doran Stabler, MD;  Location: [facility];  Service: Gastroenterology;  Laterality: N/A;  . FOOT FUSION Right   . INGUINAL HERNIA REPAIR Right X 2  . INSERT / REPLACE / REMOVE PACEMAKER  2017   medtronic  . JOINT REPLACEMENT    . LAPAROTOMY N/A 11/21/2016   Procedure: EXPLORATORY  LAPAROTOMY, REVISION OF  GASTROJEJUNOSTOMY, ESOPHAGOGASTROSCOPY;  Surgeon: Clovis Riley, MD;  Location: [facility];  Service: General;  Laterality: N/A;  . LEFT ATRIAL APPENDAGE OCCLUSION  2018   placed  via groin  . LUMBAR FUSION  X 2  . PATELLA-FEMORAL ARTHROPLASTY Right 06/02/2017   Procedure: RIGHT PATELLA-FEMORAL ARTHROPLASTY;  Surgeon: Frederik Pear, MD;  Location: [facility];  Service: Orthopedics;  Laterality: Right;  . REVERSE SHOULDER ARTHROPLASTY Left    reverse shoulder replacement x2  . ROUX-EN-Y GASTRIC BYPASS  2005  . ROUX-EN-Y PROCEDURE  20017 X 2   "revisions"  . TONSILLECTOMY AND ADENOIDECTOMY    . TOTAL HIP ARTHROPLASTY Right 02/24/2017   Procedure: TOTAL HIP ARTHROPLASTY;  Surgeon: Earlie Server, MD;  Location: [facility];  Service: Orthopedics;  Laterality: Right;  . TOTAL KNEE ARTHROPLASTY Bilateral   . TOTAL SHOULDER ARTHROPLASTY Bilateral   . TUBAL LIGATION     Social History   Social History Narrative  . Not on file   Immunization History  Administered Date(s) Administered  . Influenza Split 11/06/2004, 11/08/2005, 11/10/2007, 10/08/2008, 09/18/2010  . Influenza, High Dose Seasonal PF 09/27/2018  . Influenza, Quadrivalent,  Recombinant, Inj, Pf 11/11/2015  . Influenza,inj,Quad PF,6+ Mos 11/15/2016  . Influenza-Unspecified 11/09/2018  . PFIZER SARS-COV-2 Vaccination 03/10/2019  . Pneumococcal Conjugate-13 08/19/2013  . Pneumococcal Polysaccharide-23 12/10/2004, 01/14/2019  . Tdap 08/29/2011     Objective: Vital Signs: There were no vitals taken for this visit.   Physical Exam   Musculoskeletal Exam: ***  CDAI Exam: CDAI Score: -- Patient Global: --; Provider Global: -- Swollen: --; Tender: -- Joint Exam 2019-05-07   No joint exam has been documented for this visit   There is currently no information documented on the homunculus. Go to the Rheumatology activity and complete the homunculus joint exam.  Investigation: No additional  findings.  Imaging: No results found.  Recent Labs: Lab Results  Component Value Date   WBC 8.0 01/14/2019   HGB 12.2 01/14/2019   PLT 370 01/14/2019   NA 143 12/17/2018   K 4.3 12/17/2018   CL 103 12/17/2018   CO2 23 12/17/2018   GLUCOSE 93 12/17/2018   BUN 17 12/17/2018   CREATININE 1.15 (H) 12/17/2018   BILITOT 0.9 12/10/2018   ALKPHOS 54 12/10/2018   AST 14 (L) 12/10/2018   ALT 8 12/10/2018   PROT 6.2 (L) 12/10/2018   ALBUMIN 2.8 (L) 12/10/2018   CALCIUM 9.2 12/17/2018   GFRAA 53 (L) 12/17/2018    Speciality Comments: No specialty comments available.  Procedures:  No procedures performed Allergies: Demerol [meperidine] and Nsaids   Assessment / Plan:     Visit Diagnoses: No diagnosis found.  Orders: No orders of the defined types were placed in this encounter.  No orders of the defined types were placed in this encounter.   Face-to-face time spent with patient was *** minutes. Greater than 50% of time was spent in counseling and coordination of care.  Follow-Up Instructions: No follow-ups on file.   Ofilia Neas, PA-C  Note - This record has been created using Dragon software.  Chart creation errors have been sought, but may not always  have been located. Such creation errors do not reflect on  the standard of medical care.

## 2019-04-11 NOTE — Death Summary Note (Signed)
DEATH SUMMARY   Patient Details  Name: Deanna Spencer MRN: [id number] DOB: 1940/11/23  Admission/Discharge Information   Admit Date:  Apr 10, 2019  Date of Death: Date of Death: 2019/04/14  Time of Death: Time of Death: 1706-04-08  Length of Stay: 4  Referring Physician: Nuala Alpha, DO   Reason(s) for Hospitalization  Cardiac arrest due to ventricular fibrillation  Diagnoses  Preliminary cause of death: Hypoxic respiratory failure Secondary Diagnoses (including complications and co-morbidities):  Active Problems:   History of gastrointestinal bleeding   Cardiac arrest (HCC)   VF (ventricular fibrillation) (HCC)   Presence of Watchman left atrial appendage closure device   Sick sinus syndrome (HCC)   History of abdominal aortic aneurysm   History of valvular heart disease   AKI (acute kidney injury) ([place]) Bilateral rib fractures  Brief Hospital Course (including significant findings, care, treatment, and services provided and events leading to death)  Deanna Spencer is a 79 y.o. year old female who suffered a witnessed cardiac arrest while shopping.  She received bystander CPR.  Presenting rhythm was ventricular fibrillation.  She was brought to hospital and while it was initially thought that she would make a poor neurological recovery, she promptly woke up during CT scanning.  She was brought to the intensive care unit and eventually extubated.  Her encephalopathy began to clear.  Unfortunately she had significant pain from rib fractures and was unable to take deep breaths.  Her breathing worsened. We discussed her condition with her daughters who reported that their mother had been in declining health for some time and would not want to live in a dependent state. Given that she was further declining and that she would require intubation which she had previously refused, it was decided to transition her to comfort care and she passed away peacefully.    Pertinent Labs and Studies   Significant Diagnostic Studies CT HEAD WO CONTRAST  Result Date: 04/07/2019 CLINICAL DATA:  Follow-up examination status post cardiac arrest. EXAM: CT HEAD WITHOUT CONTRAST TECHNIQUE: Contiguous axial images were obtained from the base of the skull through the vertex without intravenous contrast. COMPARISON:  Prior head CT from 10-Apr-2019. FINDINGS: Brain: Previously noted hypodensity involving the anterior left frontal operculum again seen, stable, age indeterminate. Additional tiny chronic appearing right frontal infarct noted as well, also unchanged. No other new large vessel territory infarct. No intracranial hemorrhage. Cortical sulcation in gray-white matter differentiation maintained without evidence for evolving anoxic injury. No mass lesion or midline shift. No hydrocephalus or extra-axial fluid collection. Vascular: No hyperdense vessel. Scattered vascular calcifications noted within the carotid siphons. Skull: Scalp soft tissues and calvarium within normal limits. Sinuses/Orbits: Globes and orbital soft tissues demonstrate no acute finding. Paranasal sinuses are largely clear. No mastoid effusion. Other: None. IMPRESSION: 1. Stable head CT. No evidence for evolving anoxic brain injury status post cardiac arrest. 2. Focal hypodensity involving the anterior left frontal operculum, age indeterminate, but could reflect an evolving acute subacute ischemic infarct. Finding stable from previous. No intracranial hemorrhage. Electronically Signed   By: Jeannine Boga M.D.   On: 04/07/2019 04:24   CT Head Wo Contrast  Addendum Date: 04-10-2019   ADDENDUM REPORT: 10-Apr-2019 20:02 ADDENDUM: These results were called by telephone at the time of interpretation on 04/10/19 at 8:02 pm to provider [facility] , who verbally acknowledged these results. Electronically Signed   By: Kellie Simmering DO   On: 04/10/19 20:02   Result Date: 04/10/19 CLINICAL DATA:  Fall, head trauma,  cardiac arrest; head  trauma, headache. Spine fracture, cervical, traumatic. Additional history provided: EXAM: CT HEAD WITHOUT CONTRAST CT CERVICAL SPINE WITHOUT CONTRAST TECHNIQUE: Multidetector CT imaging of the head and cervical spine was performed following the standard protocol without intravenous contrast. Multiplanar CT image reconstructions of the cervical spine were also generated. COMPARISON:  No pertinent prior studies available for comparison. FINDINGS: CT HEAD FINDINGS Brain: There is no evidence of acute intracranial hemorrhage. There is a 2.9 cm focus of cortical/subcortical hypodensity within the mid to anterior left frontal lobe and left frontal operculum (series 3, images 21-25). This may reflect an acute ischemic infarct or non hemorrhagic parenchymal contusion. A small cortically based infarct within the mid to posterior right frontal lobe is age-indeterminate, although favored chronic (series 3, image 25). There is no evidence of intracranial mass. No midline shift or extra-axial fluid collection. Mild ill-defined hypoattenuation within the cerebral white matter is nonspecific, but consistent with chronic small vessel ischemic disease. Mild generalized parenchymal atrophy. Vascular: No hyperdense vessel. Skull: Normal. Negative for fracture or focal lesion. Sinuses/Orbits: Visualized orbits demonstrate no acute abnormality. Mild ethmoid and left maxillary sinus mucosal thickening. No significant mastoid effusion. Other: Left periorbital soft tissue hematoma. CT CERVICAL SPINE FINDINGS Alignment: Cervical levocurvature. Trace C2-C3 grade 1 anterolisthesis. Trace C3-C4 retrolisthesis. 2 mm C4-C5 grade 1 anterolisthesis. Trace anterolisthesis at the C5-C6, C6-C7 and C7-T1 levels. Skull base and vertebrae: The basion-dental and atlanto-dental intervals are maintained.No evidence of acute fracture to the cervical spine. Soft tissues and spinal canal: No prevertebral fluid or swelling. No visible canal hematoma. Disc  levels: Pannus formation/ligamentous hypertrophy at the C1-C2 articulation. Erosive changes within the posterior aspect of the dens which may reflect sequela of an inflammatory arthropathy. Cervical spondylosis. Most notably at C3-C4, there is advanced disc space narrowing with a posterior disc osteophyte complex and uncovertebral hypertrophy. Additionally, there is multilevel facet arthropathy. Upper chest: Streak artifact arising from a left-sided pacer device and left shoulder prosthesis limits evaluation of the lung apices. Interlobular septal thickening within the imaged lung apices may reflect edema. Other: There is an acute, displaced and subtly comminuted fracture of the distal left clavicle (series 5, image 55) (series 7, image 47). Acute, nondisplaced fracture of the posterior left second rib (series 7, image 50). IMPRESSION: CT head: 1. 2.9 cm region of cortical/subcortical hypodensity within the left frontal lobe, which may reflect an acute infarct or non hemorrhagic parenchymal contusion. 2. Small cortically based infarct within the mid to posterior right frontal lobe, age indeterminate but favored chronic. 3. Left periorbital soft tissue hematoma. 4. Mild generalized parenchymal atrophy and chronic small vessel ischemic disease. CT cervical spine: 1. No evidence of acute fracture to the cervical spine. 2. Cervical spondylosis without high-grade bony spinal canal narrowing. 3. Multilevel spondylolisthesis. Most notably there is 2 mm C4-C5 grade 1 anterolisthesis. 4. Ligamentous hypertrophy/pannus formation at the C1-C2 articulation. Erosive changes within the posterior aspect of the dens may reflect sequela of an inflammatory arthropathy. 5. Acute, displaced and subtly comminuted fracture of the distal left clavicle. Consider dedicated radiographs of the left shoulder for further evaluation. 6. Acute nondisplaced fracture of the posterior left second rib. Consider thoracic CT to more completely evaluate  for thoracic trauma. Electronically Signed: By: Kellie Simmering DO On: 03/13/2019 19:59   CT Cervical Spine Wo Contrast  Addendum Date: 04/08/2019   ADDENDUM REPORT: 04/04/2019 20:02 ADDENDUM: These results were called by telephone at the time of interpretation on 03/25/2019 at 8:02 pm to provider  PETER MESSICK , who verbally acknowledged these results. Electronically Signed   By: Kellie Simmering DO   On: 04/06/2019 20:02   Result Date: 03/13/2019 CLINICAL DATA:  Fall, head trauma, cardiac arrest; head trauma, headache. Spine fracture, cervical, traumatic. Additional history provided: EXAM: CT HEAD WITHOUT CONTRAST CT CERVICAL SPINE WITHOUT CONTRAST TECHNIQUE: Multidetector CT imaging of the head and cervical spine was performed following the standard protocol without intravenous contrast. Multiplanar CT image reconstructions of the cervical spine were also generated. COMPARISON:  No pertinent prior studies available for comparison. FINDINGS: CT HEAD FINDINGS Brain: There is no evidence of acute intracranial hemorrhage. There is a 2.9 cm focus of cortical/subcortical hypodensity within the mid to anterior left frontal lobe and left frontal operculum (series 3, images 21-25). This may reflect an acute ischemic infarct or non hemorrhagic parenchymal contusion. A small cortically based infarct within the mid to posterior right frontal lobe is age-indeterminate, although favored chronic (series 3, image 25). There is no evidence of intracranial mass. No midline shift or extra-axial fluid collection. Mild ill-defined hypoattenuation within the cerebral white matter is nonspecific, but consistent with chronic small vessel ischemic disease. Mild generalized parenchymal atrophy. Vascular: No hyperdense vessel. Skull: Normal. Negative for fracture or focal lesion. Sinuses/Orbits: Visualized orbits demonstrate no acute abnormality. Mild ethmoid and left maxillary sinus mucosal thickening. No significant mastoid effusion.  Other: Left periorbital soft tissue hematoma. CT CERVICAL SPINE FINDINGS Alignment: Cervical levocurvature. Trace C2-C3 grade 1 anterolisthesis. Trace C3-C4 retrolisthesis. 2 mm C4-C5 grade 1 anterolisthesis. Trace anterolisthesis at the C5-C6, C6-C7 and C7-T1 levels. Skull base and vertebrae: The basion-dental and atlanto-dental intervals are maintained.No evidence of acute fracture to the cervical spine. Soft tissues and spinal canal: No prevertebral fluid or swelling. No visible canal hematoma. Disc levels: Pannus formation/ligamentous hypertrophy at the C1-C2 articulation. Erosive changes within the posterior aspect of the dens which may reflect sequela of an inflammatory arthropathy. Cervical spondylosis. Most notably at C3-C4, there is advanced disc space narrowing with a posterior disc osteophyte complex and uncovertebral hypertrophy. Additionally, there is multilevel facet arthropathy. Upper chest: Streak artifact arising from a left-sided pacer device and left shoulder prosthesis limits evaluation of the lung apices. Interlobular septal thickening within the imaged lung apices may reflect edema. Other: There is an acute, displaced and subtly comminuted fracture of the distal left clavicle (series 5, image 55) (series 7, image 47). Acute, nondisplaced fracture of the posterior left second rib (series 7, image 50). IMPRESSION: CT head: 1. 2.9 cm region of cortical/subcortical hypodensity within the left frontal lobe, which may reflect an acute infarct or non hemorrhagic parenchymal contusion. 2. Small cortically based infarct within the mid to posterior right frontal lobe, age indeterminate but favored chronic. 3. Left periorbital soft tissue hematoma. 4. Mild generalized parenchymal atrophy and chronic small vessel ischemic disease. CT cervical spine: 1. No evidence of acute fracture to the cervical spine. 2. Cervical spondylosis without high-grade bony spinal canal narrowing. 3. Multilevel  spondylolisthesis. Most notably there is 2 mm C4-C5 grade 1 anterolisthesis. 4. Ligamentous hypertrophy/pannus formation at the C1-C2 articulation. Erosive changes within the posterior aspect of the dens may reflect sequela of an inflammatory arthropathy. 5. Acute, displaced and subtly comminuted fracture of the distal left clavicle. Consider dedicated radiographs of the left shoulder for further evaluation. 6. Acute nondisplaced fracture of the posterior left second rib. Consider thoracic CT to more completely evaluate for thoracic trauma. Electronically Signed: By: Kellie Simmering DO On: 03/28/2019 19:59   DG CHEST  PORT 1 VIEW  Result Date: 04/08/2019 CLINICAL DATA:  Recent cardiac arrest and hypoxia EXAM: PORTABLE CHEST 1 VIEW COMPARISON:  April 05, 2019 FINDINGS: Endotracheal tube has been removed. No pneumothorax. There is consolidation in the right lower lobe with small right pleural effusion. Lungs elsewhere are clear. There is cardiomegaly with pacemaker leads attached to the right atrium and right ventricle. No evident adenopathy. There is aortic atherosclerosis. There are total shoulder replacements bilaterally. IMPRESSION: Consolidation with pleural effusion on the right. Question pneumonia versus aspiration. Left lung clear. Stable cardiomegaly with pacemaker leads attached to right atrium and right ventricle. Aortic Atherosclerosis (ICD10-I70.0). Electronically Signed   By: Lowella Grip III M.D.   On: 04/08/2019 11:38   DG Chest Port 1 View  Result Date: 03/29/2019 CLINICAL DATA:  Intubated, collapse, CPR EXAM: PORTABLE CHEST 1 VIEW COMPARISON:  12/10/2018 chest radiograph. FINDINGS: Endotracheal tube tip is 2.7 cm above the carina. Stable configuration of 2 lead left subclavian pacemaker. Pacer pads overlies the lower left chest. Stable cardiomediastinal silhouette with mild cardiomegaly. No pneumothorax. No pleural effusion. Lungs appear clear, with no acute consolidative airspace disease  and no pulmonary edema. Bilateral total shoulder arthroplasty. IMPRESSION: 1. Well-positioned endotracheal tube. 2. Cardiomegaly without overt pulmonary edema. No active pulmonary disease. Electronically Signed   By: Ilona Sorrel M.D.   On: 03/22/2019 20:22   ECHOCARDIOGRAM COMPLETE  Result Date: 04/07/2019    ECHOCARDIOGRAM REPORT   Patient Name:   MAYLEI ZEBROWSKI Date of Exam: 04/07/2019 Medical Rec #:  [id number]     Height:       59.0 in Accession #:    [id number]    Weight:       120.1 lb Date of Birth:  February 05, 1940     BSA:          1.485 m Patient Age:    89 years      BP:           120/68 mmHg Patient Gender: F             HR:           78 bpm. 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 Previous Myocardial Infarction, Pacemaker;                 Arrythmias:Atrial Fibrillation. Watchman (LAA closure device).                 SSS. dysrhythmia. murmur.  Sonographer:    Jannett Celestine RDCS (AE) Referring Phys: [id number] Buren Havey [place]  1. Left ventricular ejection fraction, by estimation, is 60 to 65%. The left ventricle has normal function. Left ventricular endocardial border not optimally defined to evaluate regional wall motion. There is Not well visualized left ventricular hypertrophy. Left ventricular diastolic parameters are indeterminate.  2. Right ventricular systolic function was not well visualized. The right ventricular size is not well visualized.  3. The mitral valve was not well visualized. Mild mitral valve regurgitation. No evidence of mitral stenosis.  4. The aortic valve was not well visualized. Aortic valve regurgitation is not visualized. No aortic stenosis is present. FINDINGS  Left Ventricle: Left ventricular ejection fraction, by estimation, is 60 to 65%. The left ventricle has normal function. Left ventricular endocardial border  not  optimally defined to evaluate regional wall motion. The left ventricular internal cavity size was normal in size. There is Not well visualized left ventricular hypertrophy. Left ventricular diastolic parameters are indeterminate. Right Ventricle: The right ventricular size is not well visualized. Right vetricular wall thickness was not assessed. Right ventricular systolic function was not well visualized. Left Atrium: Left atrial size was not well visualized. Right Atrium: Right atrial size was not well visualized. Pericardium: There is no evidence of pericardial effusion. Mitral Valve: The mitral valve was not well visualized. There is mild thickening of the mitral valve leaflet(s). There is mild calcification of the mitral valve leaflet(s). Moderate mitral annular calcification. Mild mitral valve regurgitation. No evidence of mitral valve stenosis. Tricuspid Valve: The tricuspid valve is not well visualized. Tricuspid valve regurgitation is not demonstrated. No evidence of tricuspid stenosis. Aortic Valve: The aortic valve was not well visualized. . There is mild thickening and mild calcification of the aortic valve. Aortic valve regurgitation is not visualized. No aortic stenosis is present. Mild aortic valve annular calcification. There is mild thickening of the aortic valve. There is mild calcification of the aortic valve. Pulmonic Valve: The pulmonic valve was not well visualized. Pulmonic valve regurgitation is not visualized. No evidence of pulmonic stenosis. Aorta: The aortic root is normal in size and structure. Pulmonary Artery: Indeterminate PASP, inadequate TR jet. Venous: The inferior vena cava was not well visualized. IAS/Shunts: The interatrial septum was not well visualized.  LEFT VENTRICLE PLAX 2D LVIDd:         4.90 cm LVIDs:         3.00 cm LV PW:         1.00 cm LV IVS:        0.90 cm LVOT diam:     1.70 cm LV SV:         41 LV SV Index:   27 LVOT Area:     2.27 cm  LEFT ATRIUM         Index LA  diam:    4.50 cm 3.03 cm/m  AORTIC VALVE LVOT Vmax:   107.00 cm/s LVOT Vmean:  77.800 cm/s LVOT VTI:    0.179 m  AORTA Ao Root diam: 2.80 cm MITRAL VALVE MV Area (PHT): 4.15 cm     SHUNTS MV Decel Time: 183 msec     Systemic VTI:  0.18 m MV E velocity: 113.00 cm/s  Systemic Diam: 1.70 cm Carlyle Dolly MD Electronically signed by Carlyle Dolly MD Signature Date/Time: 04/07/2019/2:12:56 PM    Final     Microbiology Recent Results (from the past 240 hour(s))  Respiratory Panel by RT PCR (Flu A&B, Covid) - Nasopharyngeal Swab     Status: None   Collection Time: 03/14/2019  8:02 PM   Specimen: Nasopharyngeal Swab  Result Value Ref Range Status   SARS Coronavirus 2 by RT PCR NEGATIVE NEGATIVE Final    Comment: (NOTE) SARS-CoV-2 target nucleic acids are NOT DETECTED. The SARS-CoV-2 RNA is generally detectable in upper respiratoy specimens during the acute phase of infection. The lowest concentration of SARS-CoV-2 viral copies this assay can detect is 131 copies/mL. A negative result does not preclude SARS-Cov-2 infection and should not be used as the sole basis for treatment or other patient management decisions. A negative result may occur with  improper specimen collection/handling, submission of specimen other than nasopharyngeal swab, presence of viral mutation(s) within the areas targeted by this assay, and inadequate number of viral copies (<131 copies/mL).  A negative result must be combined with clinical observations, patient history, and epidemiological information. The expected result is Negative. Fact Sheet for Patients:  PinkCheek.be Fact Sheet for Healthcare Providers:  GravelBags.it This test is not yet ap proved or cleared by the [place] FDA and  has been authorized for detection and/or diagnosis of SARS-CoV-2 by FDA under an Emergency Use Authorization (EUA). This EUA will remain  in effect (meaning this test can  be used) for the duration of the COVID-19 declaration under Section 564(b)(1) of the Act, 21 U.S.C. section 360bbb-3(b)(1), unless the authorization is terminated or revoked sooner.    Influenza A by PCR NEGATIVE NEGATIVE Final   Influenza B by PCR NEGATIVE NEGATIVE Final    Comment: (NOTE) The Xpert Xpress SARS-CoV-2/FLU/RSV assay is intended as an aid in  the diagnosis of influenza from Nasopharyngeal swab specimens and  should not be used as a sole basis for treatment. Nasal washings and  aspirates are unacceptable for Xpert Xpress SARS-CoV-2/FLU/RSV  testing. Fact Sheet for Patients: PinkCheek.be Fact Sheet for Healthcare Providers: GravelBags.it This test is not yet approved or cleared by the [place] FDA and  has been authorized for detection and/or diagnosis of SARS-CoV-2 by  FDA under an Emergency Use Authorization (EUA). This EUA will remain  in effect (meaning this test can be used) for the duration of the  Covid-19 declaration under Section 564(b)(1) of the Act, 21  U.S.C. section 360bbb-3(b)(1), unless the authorization is  terminated or revoked. Performed at [facility], [place] [address] [place], [place] [address]   MRSA PCR Screening     Status: None   Collection Time: 04/06/19  9:54 AM   Specimen: Nasal Mucosa; Nasopharyngeal  Result Value Ref Range Status   MRSA by PCR NEGATIVE NEGATIVE Final    Comment:        The GeneXpert MRSA Assay (FDA approved for NASAL specimens only), is one component of a comprehensive MRSA colonization surveillance program. It is not intended to diagnose MRSA infection nor to guide or monitor treatment for MRSA infections. Performed at [facility], [place] [address], [place],  [address]     Lab Basic Metabolic Panel: Recent Labs  Lab 03/11/2019 1750 04/07/19 0558 04/08/19 0650 05/07/2019 0317  NA 142 141 140 141  K 3.3* 4.1 4.5 4.5  CL  --  109  107 109  CO2  --  19* 21* 18*  GLUCOSE  --  106* 131* 129*  BUN  --  46* 55* 65*  CREATININE  --  2.87* 3.45* 3.53*  CALCIUM  --  8.5* 9.0 9.0  MG  --  1.7  --  2.2   Liver Function Tests: Recent Labs  Lab 04/08/19 0650  AST 91*  ALT 60*  ALKPHOS 62  BILITOT 1.3*  PROT 6.2*  ALBUMIN 3.0*   No results for input(s): LIPASE, AMYLASE in the last 168 hours. No results for input(s): AMMONIA in the last 168 hours. CBC: Recent Labs  Lab 03/18/2019 1750 04/07/19 0558 04/08/19 0650 05/07/2019 0317  WBC  --  13.1* 11.3* 9.9  NEUTROABS  --  11.6* 10.3* 8.5*  HGB 12.6 10.9* 11.4* 11.4*  HCT 37.0 36.0 38.0 38.2  MCV  --  95.7 96.9 97.0  PLT  --  194 209 224   Cardiac Enzymes: No results for input(s): CKTOTAL, CKMB, CKMBINDEX, TROPONINI in the last 168 hours. Sepsis Labs: Recent Labs  Lab 04/07/19 [id number] 04/08/19 [id number] 2019-05-07 [id number]  WBC 13.1* 11.3* 9.9    Procedures/Operations  CT scanning, mechanical ventilation.   Arad Burston 04/10/2019, 6:07 PM

## 2019-04-11 NOTE — Progress Notes (Signed)
NAME:  Deanna Spencer, MRN:  [id number], DOB:  03-15-1940, LOS: 4 ADMISSION DATE:  04/08/2019, CONSULTATION DATE:  04/08/2019 REFERRING MD:  Dr. Garlan Fillers, ER, CHIEF COMPLAINT:  Cardiac arrest   Brief History   79 yo female had witnessed arrest.  Noted to be in VF.  ROSC after 45 minutes.  Family opted to continue short term trial of supportive care, but DNR otherwise.  History of present illness   79 yo female was in usual state of health and out shopping at [place].  Had witnessed arrest.  On arrival by EMS had agonal respiratory efforts.  Noted to be in VF.  Shocked x 3, and given amiodarone.  Then had PEA and then VT with repeated shock.  Had ROSC after 45 minutes.  On arrival by family medical team informed pt had DNR status.  They opted to continue supportive care to see if she showed any signs of improvement.  Past Medical History  PUD, Sick sinus syndrome, Bladder spasm, RA, Allergies, OA, Neuropathy, Osteoporosis, IBS, Hiatal hernia, Gout, GERD, Diverticulosis, Systolic CHF, A fib  Significant Hospital Events   3/26 Admit 3/27 Exubated and nearly fully conversant 3/28 having chest wall pain with deep inspiration.  Awake, more disoriented in evenings  Consults:  PCCM  Procedures:  ETT 3/26   Significant Diagnostic Tests:  3/26 [facility] >> 1. 2.9 cm region of cortical/subcortical hypodensity within the left frontal lobe, which may reflect an acute infarct or non hemorrhagic parenchymal contusion. 2. Small cortically based infarct within the mid to posterior right frontal lobe, age indeterminate but favored chronic. 3. Left periorbital soft tissue hematoma. 4. Mild generalized parenchymal atrophy and chronic small vessel ischemic disease.  3/26 CT cervical >> 1. No evidence of acute fracture to the cervical spine. 2. Cervical spondylosis without high-grade bony spinal canal narrowing. 3. Multilevel spondylolisthesis. Most notably there is 2 mm C4-C5 grade 1 anterolisthesis.  4.  Ligamentous hypertrophy/pannus formation at the C1-C2 articulation. Erosive changes within the posterior aspect of the dens may reflect sequela of an inflammatory arthropathy. 5. Acute, displaced and subtly comminuted fracture of the distal left clavicle. Consider dedicated radiographs of the left shoulder for further evaluation. 6. Acute nondisplaced fracture of the posterior left second rib.  Consider thoracic CT to more completely evaluate for thoracic trauma.  3/28 CTH >>  1. Stable head CT. No evidence for evolving anoxic brain injury status post cardiac arrest. 2. Focal hypodensity involving the anterior left frontal operculum, age indeterminate, but could reflect an evolving acute subacute ischemic infarct. Finding stable from previous. No intracranial hemorrhage  3/28 TTE >>  1. Left ventricular ejection fraction, by estimation, is 60 to 65%. The left ventricle has normal function. Left ventricular endocardial border not optimally defined to evaluate regional wall motion. There is Not well visualized left ventricular hypertrophy. Left ventricular diastolic parameters are indeterminate.  2. Right ventricular systolic function was not well visualized. The right ventricular size is not well visualized.  3. The mitral valve was not well visualized. Mild mitral valve regurgitation. No evidence of mitral stenosis.  4. The aortic valve was not well visualized. Aortic valve regurgitation is not visualized. No aortic stenosis is present.   3/29 CXR >> Consolidation with pleural effusion on the right. Question pneumonia versus aspiration. Left lung clear.  Stable cardiomegaly with pacemaker leads attached to right atrium and right ventricle. Aortic Atherosclerosis   Micro Data:  SARS CoV2 3/26 >> neg 3/27 MRSA PCR >> neg  Antimicrobials:  None.  Interim history/subjective:  Afebrile Worsening renal function/ oliguria despite gentle hydration Worsening SOB, ongoing chest wall pain, and  desaturation this am  Daughter at bedside ready to transition to full comfort care  Objective   Blood pressure (!) 98/57, pulse 62, temperature 97.8 F (36.6 C), temperature source Oral, resp. rate (!) 26, weight 54.5 kg, SpO2 97 %.        Intake/Output Summary (Last 24 hours) at 2019-04-29 [id number] Last data filed at 2019-04-29 0800 Gross per 24 hour  Intake 1434.32 ml  Output 492 ml  Net 942.32 ml   Filed Weights   04/07/19 0500  Weight: 54.5 kg    Examination: General:  Frail elderly female sitting upright in bed in distress/ audible rales HEENT: MM pink/moist, left periorbital ecchymosis, dressing over left eye Neuro: Awake, oriented to self, MAE CV: irir, afib PULM:  resp distress, audible rales, poor cough, chest wall crepitus and paradoxical movement GI: soft, foley  Extremities: warm/dry, trace LE edema  Skin: no rashes   Resolved Hospital Problem list   Acute hypoxic respiratory failure in setting of cardiac arrest.  Assessment & Plan:   Acute hypoxic respiratory insufficiency due to rib fractures +/- new? aspiration component +/- atelectasis  VF cardiac arrest. Permanent A fib s/p Watchman device. Chronic systolic CHF with ischemic CM Cerebral contusion  Hx of chronic pain AKI due to hypoperfusion during arrest, now possible obstructive uropathy due to retention  Protein calorie malnutrition P:  Patient is tired, SOB and in pain.  Daughter at bedside, tells me she/ family is ready to transition to full comfort care before I can approach topic.  Main goal of family is her comfort. Family ok with NTS prn, however patient refused.   Will start morphine gtt and titrate as needed Prn ativan, robinul, benadryl, tylenol, and ativan as needed for comfort/ agitation Anticipate hospital death, but did relay to daughter unpredictable time frame of how this will go, could be minutes to days.   Ongoing supportive care  PCCM will continue to follow, will transfer to floor.   Keep on our service.   Labs   CBC: Recent Labs  Lab 04/02/2019 1750 04/07/19 0558 04/08/19 0650 2019-04-29 0317  WBC  --  13.1* 11.3* 9.9  NEUTROABS  --  11.6* 10.3* 8.5*  HGB 12.6 10.9* 11.4* 11.4*  HCT 37.0 36.0 38.0 38.2  MCV  --  95.7 96.9 97.0  PLT  --  194 209 [id number]    Basic Metabolic Panel: Recent Labs  Lab 03/11/2019 1750 04/07/19 0558 04/08/19 0650 Apr 29, 2019 0317  NA 142 141 140 141  K 3.3* 4.1 4.5 4.5  CL  --  109 107 109  CO2  --  19* 21* 18*  GLUCOSE  --  106* 131* 129*  BUN  --  46* 55* 65*  CREATININE  --  2.87* 3.45* 3.53*  CALCIUM  --  8.5* 9.0 9.0  MG  --  1.7  --  2.2   GFR: Estimated Creatinine Clearance: 10.2 mL/min (A) (by C-G formula based on SCr of 3.53 mg/dL (H)). Recent Labs  Lab 04/07/19 0558 04/08/19 0650 2019-04-29 0317  WBC 13.1* 11.3* 9.9    Liver Function Tests: Recent Labs  Lab 04/08/19 0650  AST 91*  ALT 60*  ALKPHOS 62  BILITOT 1.3*  PROT 6.2*  ALBUMIN 3.0*   No results for input(s): LIPASE, AMYLASE in the last 168 hours. No results for input(s): AMMONIA in the last 168 hours.  ABG    Component Value Date/Time   PHART 7.046 (LL) 03/24/2019 1750   PCO2ART 43.7 04/01/2019 1750   PO2ART 311.0 (H) 03/11/2019 1750   HCO3 12.0 (L) 04/08/2019 1750   TCO2 13 (L) 04/07/2019 1750   ACIDBASEDEF 18.0 (H) 03/11/2019 1750   O2SAT 100.0 03/12/2019 1750     Coagulation Profile: No results for input(s): INR, PROTIME in the last 168 hours.  Cardiac Enzymes: No results for input(s): CKTOTAL, CKMB, CKMBINDEX, TROPONINI in the last 168 hours.  HbA1C: No results found for: HGBA1C  CBG: No results for input(s): GLUCAP in the last 168 hours.     Kennieth Rad, MSN, AGACNP-BC [facility] 05-03-2019, 9:17 AM

## 2019-04-11 NOTE — Progress Notes (Signed)
Patient transitioned to comfort care measures this morning per family request in collaboration w/ medical team. Patient started on morphine gtt for pain control and comfort. Patient died at 79 w/ daughter [name] at bedside. This RN and Janith Lima RN pronounced death. Post-mortem checklist completed, patient placement notified and patient transferred to morgue at 2007. 65 mL morphine wasted w/ Janith Lima RN.  Nelva Bush RN

## 2019-04-11 NOTE — Patient Instructions (Signed)
Visit Information  Goals Addressed            This Visit's Progress   . "I have been having an issue with nausea and vomiting for a long time but it has gotten worse." (pt-stated)       Current Barriers:  . Chronic Disease Management support, education, and care coordination needs related to nausea and vomiting in a patient with GERD and known gastrojejunal ulcer- patient stated that she was having problems with her bowels  she had no control over bowel movement she is wearing pads and putting tissue in underwear.  Clinical Goal(s) related to  nausea and vomiting :  Over the next 30 days, patient will:  . Work with the care management team to address educational, disease management, and care coordination needs  . Call provider office for new or worsened signs and symptoms  . Call care management team with questions or concerns . Verbalize basic understanding of patient centered plan of care established today  Interventions related to  nausea and vomiting :  . Evaluation of current treatment plans and patient's adherence to plan as established by provider . Assessed patient understanding of disease states . Assessed patient's education and care coordination needs . Provided disease specific education to patient  . Advised the patient to call her gastroenterologist today as the doctor stated at her visit on 01-14-19.  . 01/29/19 . Patient states that she is in [place] with her daughter for a vacation.  She is doing well. . She is taking her medication as prescribed. . She states that she is still struggling to eat. But is able to keep some food down. . She had a visit with the gastroenterologist, but was not pleased with the news.  She was tearful and worried.  She state that she would like to have  second  opinion. . Advised patient that it is her right to have a second opinion. . 03/11/19 . Spoke with the patient and she had been out of town for 3 weeks in [place] and having nausa and  vomiting.  The patient stated that she had spoken with a physician in [place].  She came home and flew out to [place] had some test done and had her Throat steched using balloons. She stayed there 3 weeks  she has been able to keep food down and his weighing 112 lbs. . The patient states that she got her first covid 19 shot on 2/28 and her arm is sore some and having some palpitations but she is keeping a check on it and her daughters calls her every hour to check on her. . 03/27/19 . Advised  the patient to call the office and make an appointment  . Continue with stool softener  . Stay hydrated . Increase fiber  Patient Self Care Activities related to  nausea and vomiting :  . Patient is unable to independently self-manage chronic health conditions  Please see past updates related to this goal by clicking on the "Past Updates" button in the selected goal      . Patient Stated (pt-stated)       .cc       Ms. Hammack was given information about Care Management services today including:  1. Care Management services include personalized support from designated clinical staff supervised by her physician, including individualized plan of care and coordination with other care providers 2. 24/7 contact phone numbers for assistance for urgent and routine care needs. 3. The patient may  stop CCM services at any time (effective at the end of the month) by phone call to the office staff.  Patient agreed to services and verbal consent obtained.   The patient verbalized understanding of instructions provided today and declined a print copy of patient instruction materials.   The care management team will reach out to the patient again over the next 14 days.  The patient has been provided with contact information for the care management team and has been advised to call with any health related questions or concerns.   Lazaro Arms RN, BSN, [facility] Care Management Coordinator [place] Phone: [phone] Fax: [phone]

## 2019-04-11 NOTE — Progress Notes (Signed)
Decision regarding full comfort care noted. No further cardiac workup recommended at this time. Appreciate the care she received during this hospitalization.  Nigel Mormon, MD [facility] [place] Pager: [phone] Office: [phone]

## 2019-04-11 NOTE — Progress Notes (Signed)
FPTS Interim Progress Note  FPTS acknowledges that patient is admitted in ICU.  Appreciate excellent care provided for this patient by CCM.  If patient becomes stable for the floor, FPTS will be happy to assume care for this patient.  We will continue to follow along socially while she is under the care of CCM.  When patient is ready for transfer to the floor, please page [phone] for signout.  Gladys Damme, MD Apr 29, 2019, 7:56 AM PGY-1, [facility] pager [phone]

## 2019-04-11 NOTE — Chronic Care Management (AMB) (Cosign Needed)
Care Management   Follow Up Note   2019/04/15 Name: Deanna Spencer MRN: [id number] DOB: September 24, 1940  Referred by: Nuala Alpha, DO Reason for referral : Care Coordination (Care Management RNCM CHF/ A FIB)   Deanna Spencer is a 79 y.o. year old female who is a primary care patient of Nuala Alpha, DO. The care management team was consulted for assistance with care management and care coordination needs.    Review of patient status, including review of consultants reports, relevant laboratory and other test results, and collaboration with appropriate care team members and the patient's provider was performed as part of comprehensive patient evaluation and provision of chronic care management services.    SDOH (Social Determinants of Health) assessments performed: No See Care Plan activities for detailed interventions related to [facility])     Advanced Directives: See Care Plan and Vynca application for related entries.   Goals Addressed            This Visit's Progress   . "I have been having an issue with nausea and vomiting for a long time but it has gotten worse." (pt-stated)       Current Barriers:  . Chronic Disease Management support, education, and care coordination needs related to nausea and vomiting in a patient with GERD and known gastrojejunal ulcer- patient stated that she was having problems with her bowels  she had no control over bowel movement she is wearing pads and putting tissue in underwear.  Clinical Goal(s) related to  nausea and vomiting :  Over the next 30 days, patient will:  . Work with the care management team to address educational, disease management, and care coordination needs  . Call provider office for new or worsened signs and symptoms  . Call care management team with questions or concerns . Verbalize basic understanding of patient centered plan of care established today  Interventions related to  nausea and vomiting :  . Evaluation of current  treatment plans and patient's adherence to plan as established by provider . Assessed patient understanding of disease states . Assessed patient's education and care coordination needs . Provided disease specific education to patient  . Advised the patient to call her gastroenterologist today as the doctor stated at her visit on 01-14-19.  . 01/29/19 . Patient states that she is in [place] with her daughter for a vacation.  She is doing well. . She is taking her medication as prescribed. . She states that she is still struggling to eat. But is able to keep some food down. . She had a visit with the gastroenterologist, but was not pleased with the news.  She was tearful and worried.  She state that she would like to have  second  opinion. . Advised patient that it is her right to have a second opinion. . 03/11/19 . Spoke with the patient and she had been out of town for 3 weeks in [place] and having nausa and vomiting.  The patient stated that she had spoken with a physician in [place].  She came home and flew out to [place] had some test done and had her Throat steched using balloons. She stayed there 3 weeks  she has been able to keep food down and his weighing 112 lbs. . The patient states that she got her first covid 19 shot on 2/28 and her arm is sore some and having some palpitations but she is keeping a check on it and her daughters calls her every hour  to check on her. . 03/27/19 . Advised  the patient to call the office and make an appointment  . Continue with stool softener  . Stay hydrated . Increase fiber  Patient Self Care Activities related to  nausea and vomiting :  . Patient is unable to independently self-manage chronic health conditions  Please see past updates related to this goal by clicking on the "Past Updates" button in the selected goal      . Patient Stated (pt-stated)       .cc        The care management team will reach out to the patient again over the next 14 days.  The  patient has been provided with contact information for the care management team and has been advised to call with any health related questions or concerns.   Lazaro Arms RN, BSN, [facility] Care Management Coordinator [place] Phone: [phone] Fax: [phone]

## 2019-04-11 DEATH — deceased

## 2019-04-12 ENCOUNTER — Ambulatory Visit: Payer: Medicare Other | Admitting: Cardiology

## 2019-04-16 ENCOUNTER — Telehealth: Payer: Medicare Other

## 2019-04-18 ENCOUNTER — Ambulatory Visit: Payer: Medicare Other | Admitting: Cardiology

## 2019-04-22 ENCOUNTER — Other Ambulatory Visit: Payer: Self-pay | Admitting: Pharmacist

## 2019-04-22 NOTE — Patient Outreach (Signed)
[facility]) Care Management  04/22/2019  Deanna Spencer 1940/12/18 [id number]   Patient is deceased. As such, her case is being closed.  Elayne Guerin, PharmD, [place] Clinical Pharmacist [phone]

## 2019-05-06 ENCOUNTER — Ambulatory Visit: Payer: Self-pay | Admitting: Pharmacist

## 2021-04-04 IMAGING — CR LEFT SHOULDER - 2+ VIEW
4 series · 4 of 4 positions shown · non-contrast
Comparison: 05/11/2018

CLINICAL DATA: Shoulder pain

EXAM:
LEFT SHOULDER - 2+ VIEW

[shoulder grashey]
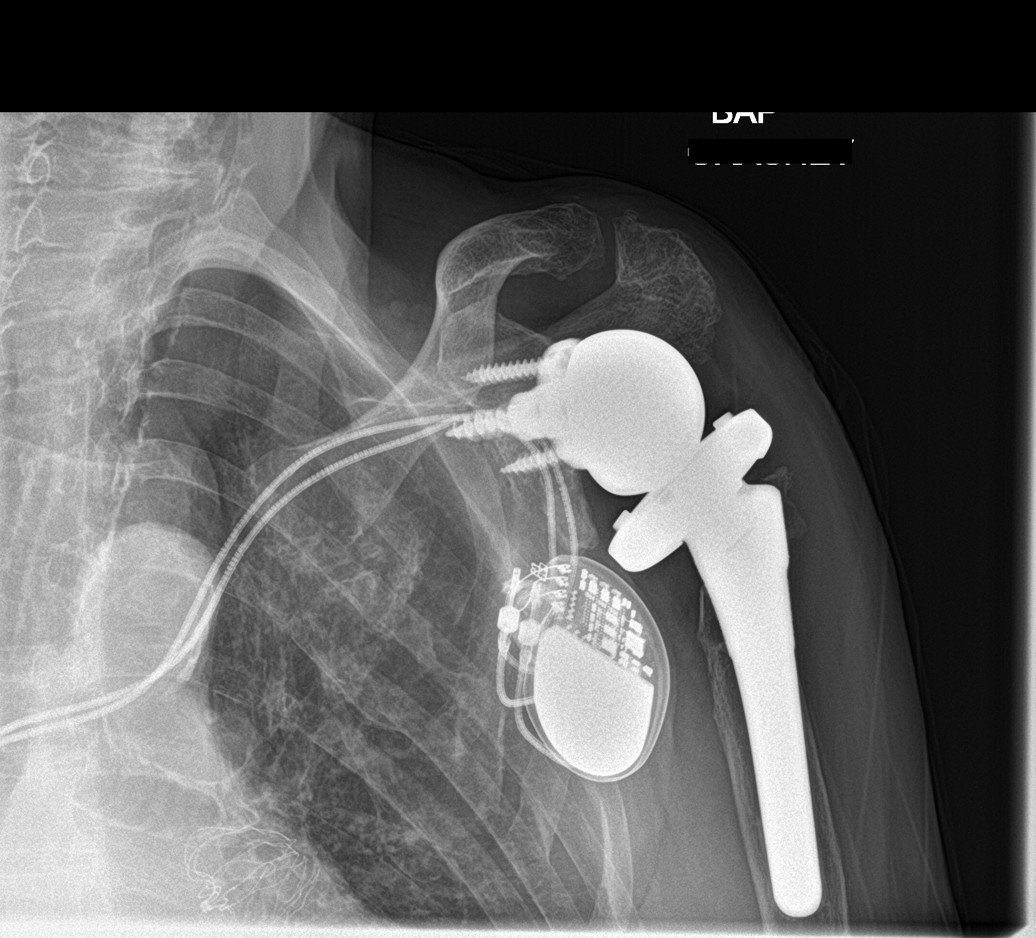

[shoulder y view (1 of 2)]
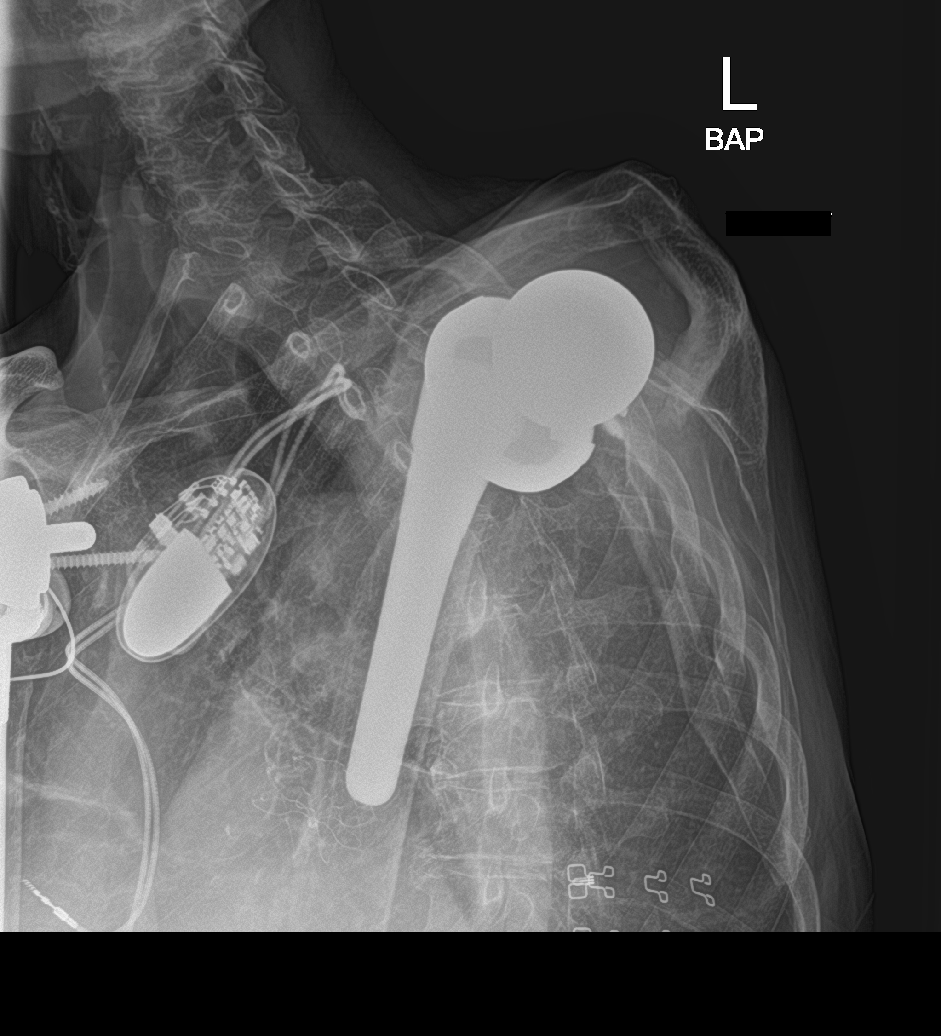

[shoulder ap neutral]
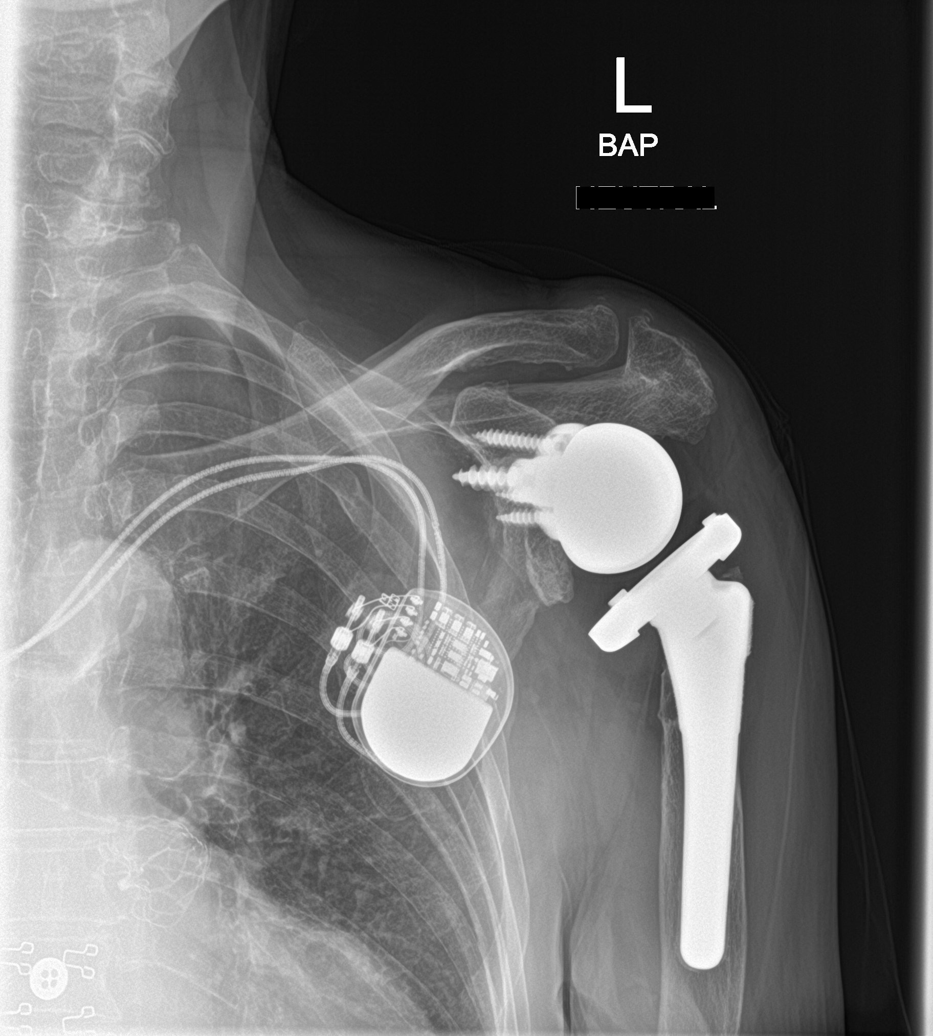

[shoulder y view (2 of 2)]
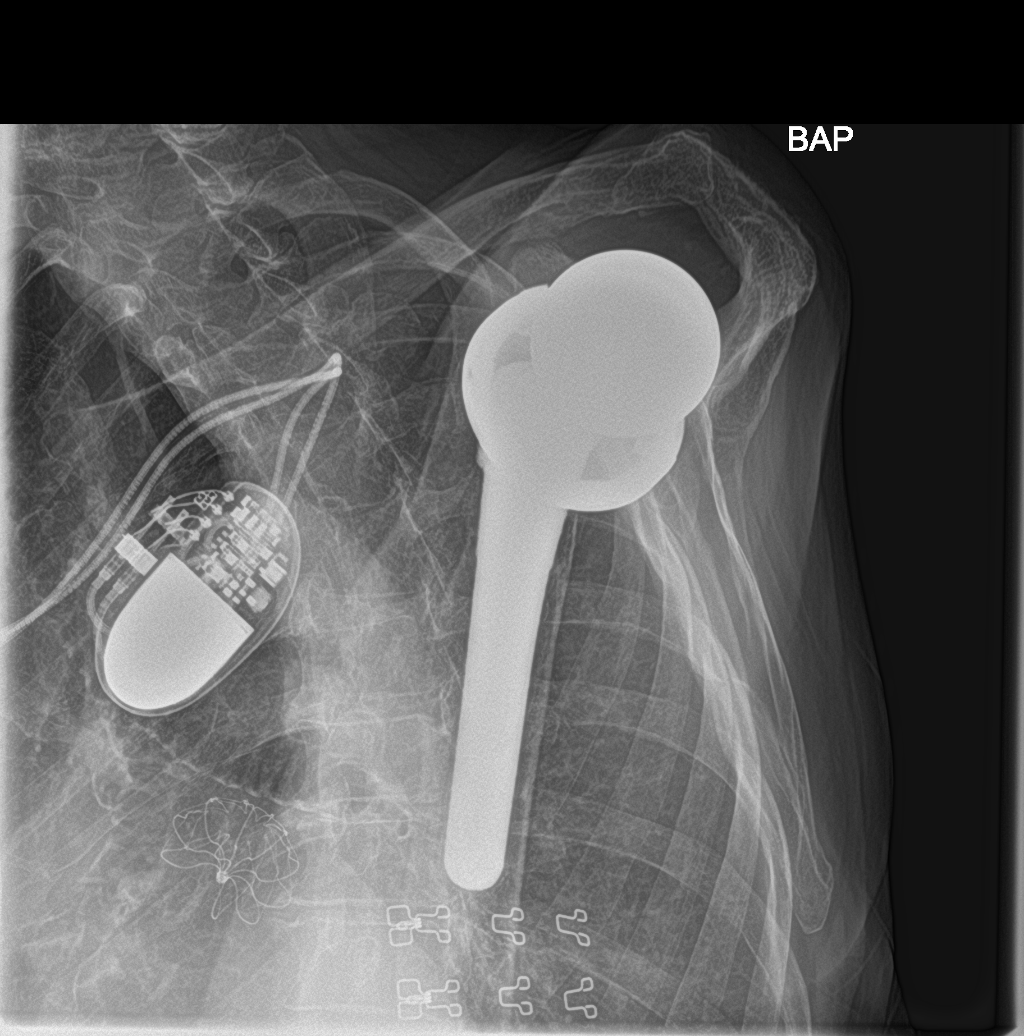

[4 of 4 positions shown; findings below may reference images not displayed]

FINDINGS: Status post left reverse shoulder replacement with normal alignment
and grossly intact hardware. Mild AC joint degenerative changes.
Left-sided pacing generator.
IMPRESSION: Status post left shoulder replacement without acute osseous
abnormality

## 2021-06-12 IMAGING — MG DIGITAL SCREENING BILATERAL MAMMOGRAM WITH CAD
6 series · 6 of 6 positions shown · non-contrast
Comparison: None

CLINICAL DATA: Screening.

EXAM:
DIGITAL SCREENING BILATERAL MAMMOGRAM WITH CAD

[R MLO (1 of 2)]
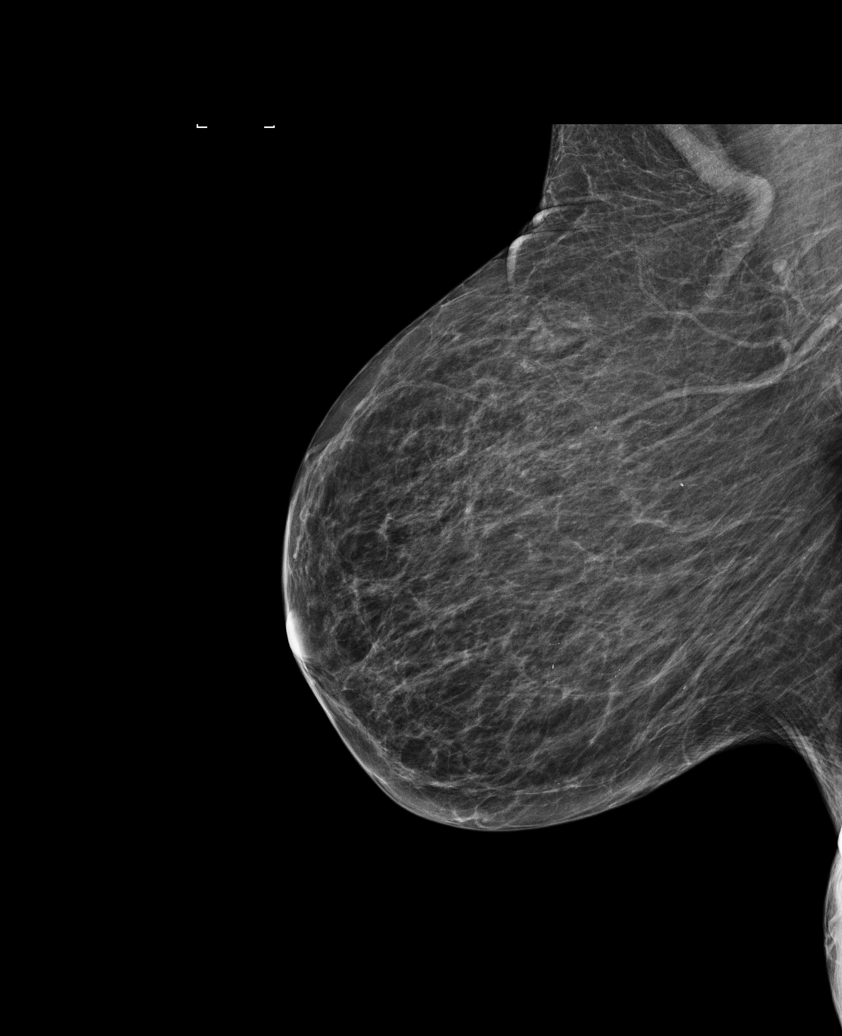

[R CC]
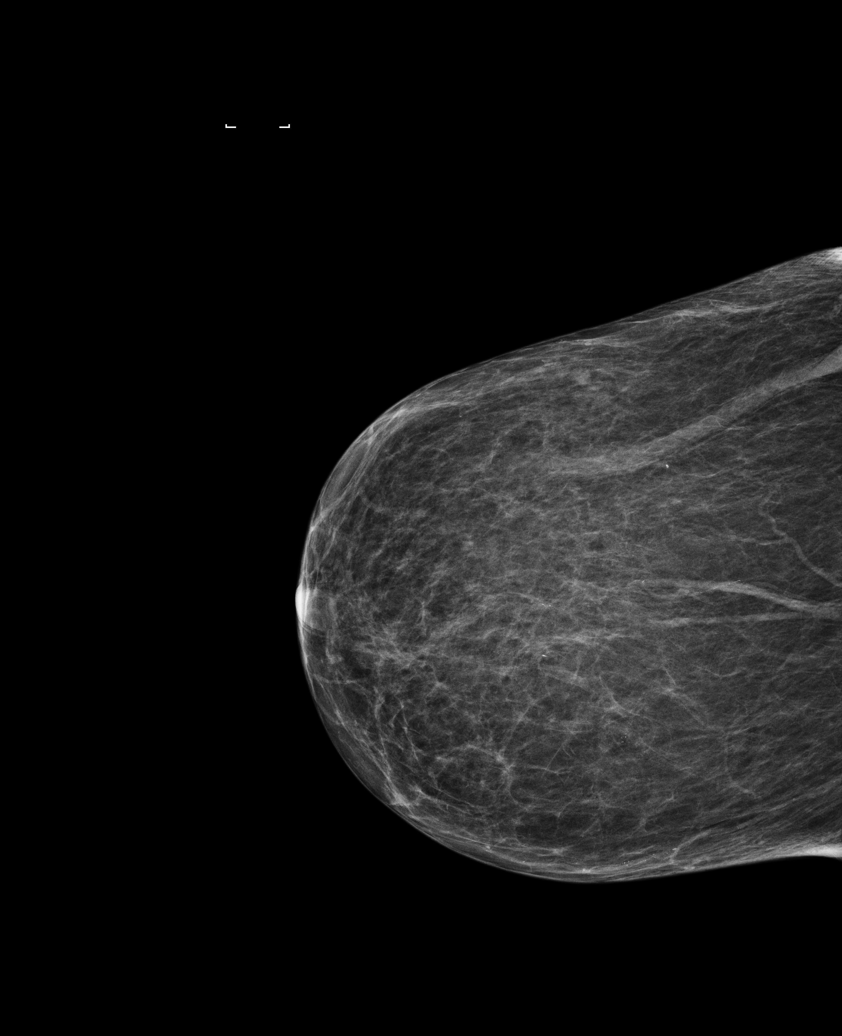

[L CC]
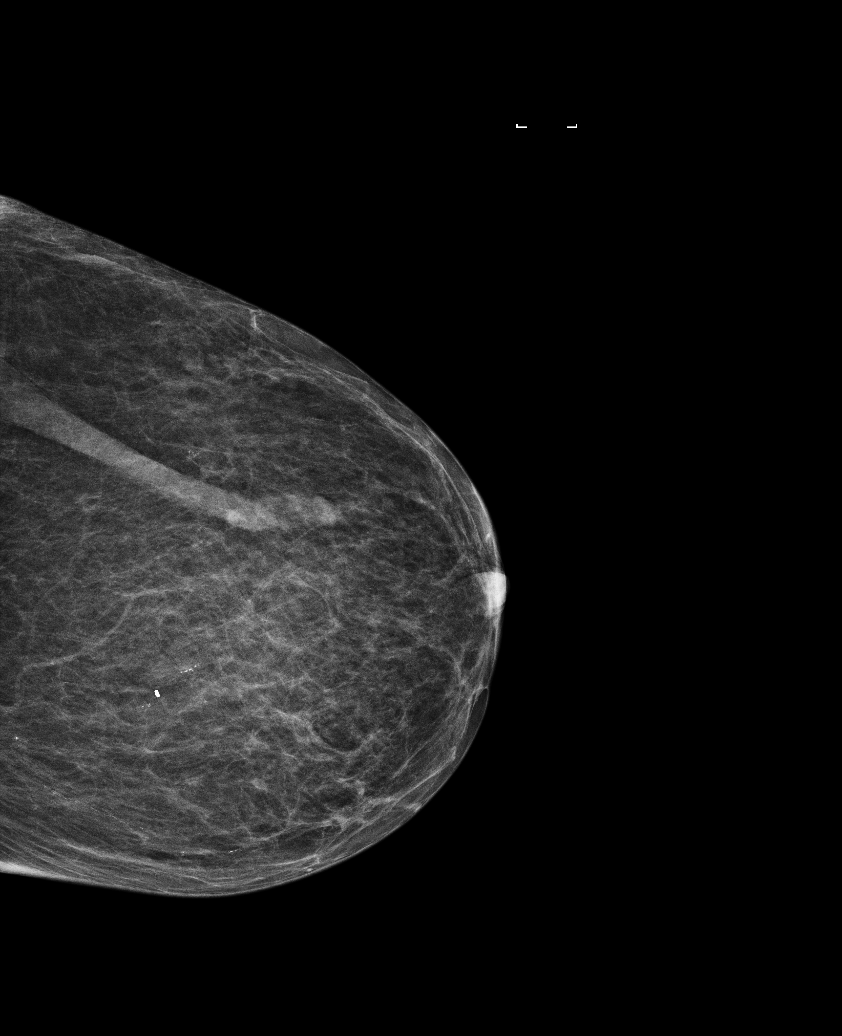

[L MLO (1 of 2)]
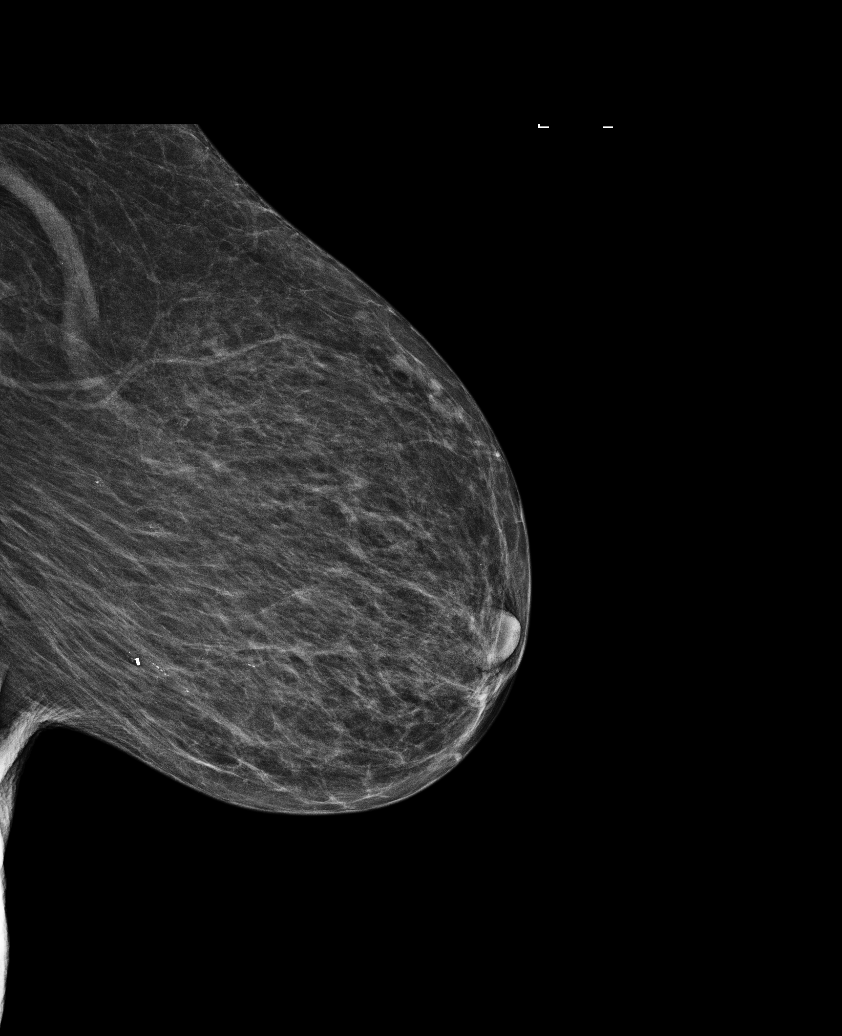

[R MLO (2 of 2)]
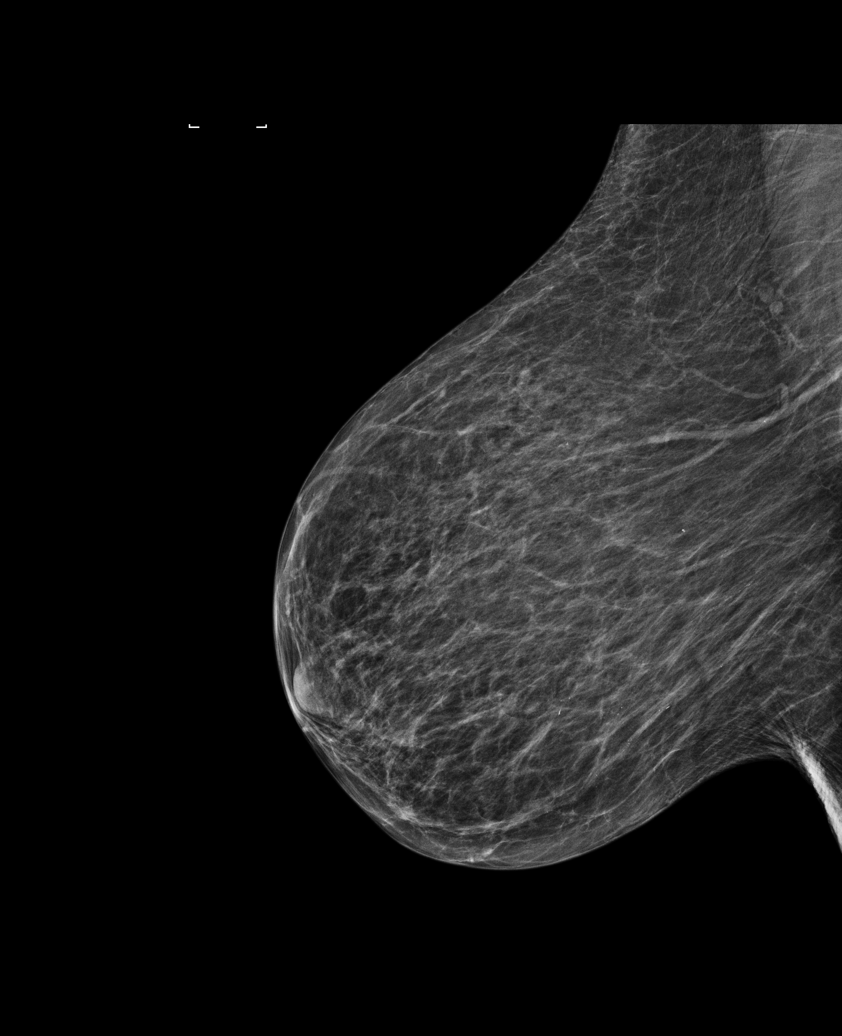

[L MLO (2 of 2)]
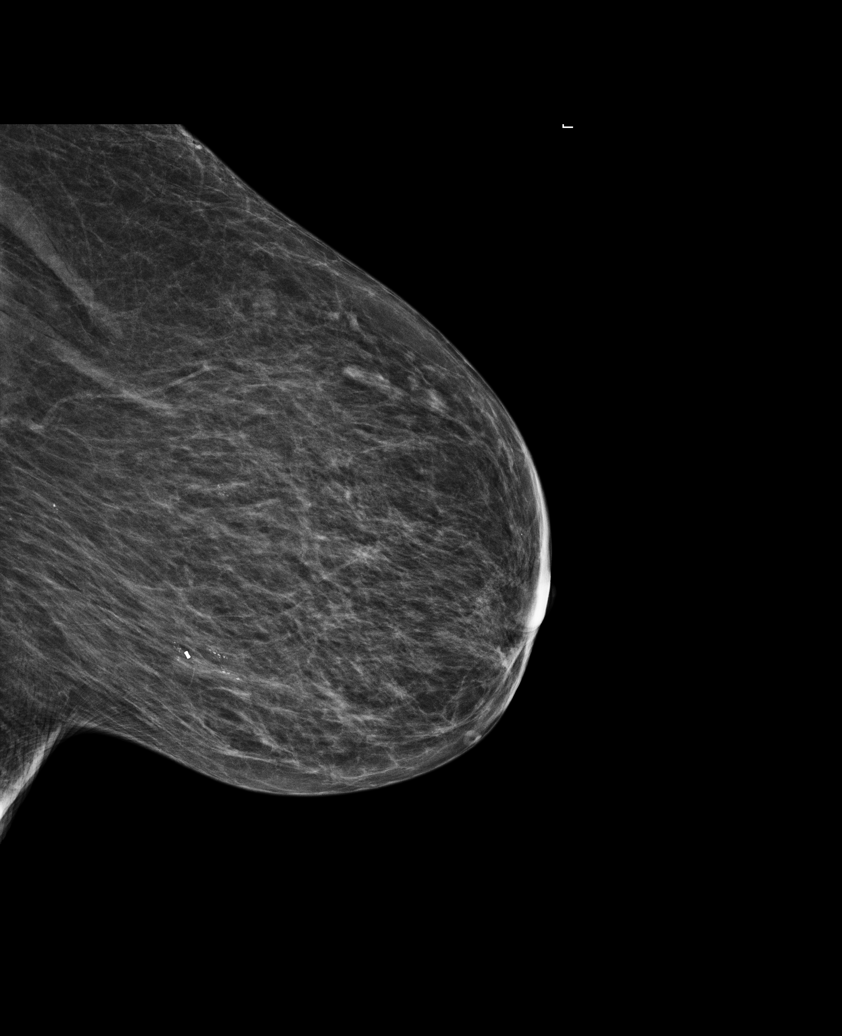

[6 of 6 positions shown; findings below may reference images not displayed]

ACR Breast Density Category b: There are scattered areas of
fibroglandular density.
FINDINGS: In both breasts, possible masses warrant further evaluation. Images
were processed with CAD.
IMPRESSION: Further evaluation is suggested for possible masses in the right and
left breasts.

RECOMMENDATION:
Diagnostic mammogram and possibly ultrasound of the right and left
breasts. (Code:[id number])

The patient will be contacted regarding the findings, and additional
imaging will be scheduled.

BI-RADS CATEGORY  0: Incomplete. Need additional imaging evaluation
and/or prior mammograms for comparison.
